# Patient Record
Sex: Female | Born: 1955 | Race: White | Hispanic: No | Marital: Single | Smoking: Former smoker
Health system: Southern US, Community
[De-identification: ages and names within clinical notes are randomized; demographics above are authoritative.]

## PROBLEM LIST (undated history)

## (undated) DIAGNOSIS — Z923 Personal history of irradiation: Secondary | ICD-10-CM

## (undated) DIAGNOSIS — I10 Essential (primary) hypertension: Secondary | ICD-10-CM

## (undated) DIAGNOSIS — C801 Malignant (primary) neoplasm, unspecified: Secondary | ICD-10-CM

## (undated) DIAGNOSIS — Z5111 Encounter for antineoplastic chemotherapy: Secondary | ICD-10-CM

## (undated) DIAGNOSIS — E785 Hyperlipidemia, unspecified: Secondary | ICD-10-CM

## (undated) DIAGNOSIS — C3492 Malignant neoplasm of unspecified part of left bronchus or lung: Secondary | ICD-10-CM

## (undated) DIAGNOSIS — I639 Cerebral infarction, unspecified: Secondary | ICD-10-CM

## (undated) DIAGNOSIS — J449 Chronic obstructive pulmonary disease, unspecified: Secondary | ICD-10-CM

## (undated) HISTORY — DX: Encounter for antineoplastic chemotherapy: Z51.11

## (undated) HISTORY — DX: Malignant neoplasm of unspecified part of left bronchus or lung: C34.92

## (undated) HISTORY — DX: Cerebral infarction, unspecified: I63.9

## (undated) HISTORY — PX: BREAST BIOPSY: SHX20

## (undated) HISTORY — DX: Personal history of irradiation: Z92.3

## (undated) HISTORY — DX: Essential (primary) hypertension: I10

## (undated) HISTORY — DX: Malignant (primary) neoplasm, unspecified: C80.1

## (undated) HISTORY — DX: Chronic obstructive pulmonary disease, unspecified: J44.9

## (undated) HISTORY — DX: Hyperlipidemia, unspecified: E78.5

---
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
## 2006-06-21 HISTORY — PX: THORACOTOMY/LOBECTOMY: SHX6116

## 2016-11-04 ENCOUNTER — Telehealth: Payer: Self-pay | Admitting: Family Medicine

## 2016-11-04 ENCOUNTER — Encounter: Payer: Self-pay | Admitting: Family Medicine

## 2016-11-04 ENCOUNTER — Ambulatory Visit (INDEPENDENT_AMBULATORY_CARE_PROVIDER_SITE_OTHER): Payer: Medicare (Managed Care) | Admitting: Family Medicine

## 2016-11-04 VITALS — BP 130/80 | HR 50 | Resp 12 | Ht 63.0 in | Wt 149.4 lb

## 2016-11-04 DIAGNOSIS — C3490 Malignant neoplasm of unspecified part of unspecified bronchus or lung: Secondary | ICD-10-CM | POA: Insufficient documentation

## 2016-11-04 DIAGNOSIS — M25561 Pain in right knee: Secondary | ICD-10-CM

## 2016-11-04 DIAGNOSIS — R001 Bradycardia, unspecified: Secondary | ICD-10-CM

## 2016-11-04 DIAGNOSIS — R55 Syncope and collapse: Secondary | ICD-10-CM

## 2016-11-04 DIAGNOSIS — E039 Hypothyroidism, unspecified: Secondary | ICD-10-CM

## 2016-11-04 DIAGNOSIS — C3492 Malignant neoplasm of unspecified part of left bronchus or lung: Secondary | ICD-10-CM | POA: Diagnosis not present

## 2016-11-04 LAB — BASIC METABOLIC PANEL
BUN: 24 mg/dL — ABNORMAL HIGH (ref 6–23)
CO2: 30 meq/L (ref 19–32)
Calcium: 9.5 mg/dL (ref 8.4–10.5)
Chloride: 102 mEq/L (ref 96–112)
Creatinine, Ser: 0.78 mg/dL (ref 0.40–1.20)
GFR: 80.21 mL/min (ref >60.00)
GLUCOSE: 84 mg/dL (ref 70–99)
POTASSIUM: 4.9 meq/L (ref 3.5–5.1)
SODIUM: 140 meq/L (ref 135–145)

## 2016-11-04 LAB — TSH: TSH: 2.36 u[IU]/mL (ref 0.35–4.50)

## 2016-11-04 NOTE — Patient Instructions (Addendum)
A few things to remember from today's visit:   Sinus bradycardia - Plan: EKG [id number], Basic metabolic panel, TSH  Malignant neoplasm of left lung, unspecified part of lung ([place]) - Plan: Ambulatory referral to Hematology / Oncology  Primary hypothyroidism  Syncope, unspecified syncope type  Acute pain of right knee  Local icy ho and Lidocaine for knee. ? Of osteoarthritis.  Avoid going up and down stairs if possible.   Please be sure medication list is accurate. If a new problem present, please set up appointment sooner than planned today.

## 2016-11-04 NOTE — Telephone Encounter (Signed)
Medications updated

## 2016-11-04 NOTE — Telephone Encounter (Signed)
°  FYI  Pt call to say she is taking the following med  metoprolo '25mg'$  1/2 in the morning and 1/2 at night  lipitor '40mg'$    prilosec '20mg'$   Advair 1 squirt in the morning and 1 squirt at night

## 2016-11-04 NOTE — Progress Notes (Signed)
HPI:   [name] is a 61 y.o. female, who is here today with her brother to establish care.  Former PCP: Dr Carlena Bjornstad, [facility]) Last preventive routine visit: within the past year.  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   Also hypothyroidism Dx about 20 years ago.She is on Levothyroxine 75 mcg daily.  She moved with her brother about 10 days ago because Dx with left lung cancer about 30 days ago, stage II. According to pt, chemo and radiotherapy was planned but recommended to be closed to family during treatment. She was following with Dr Truman Hayward, oncologists.  She is planning on moving to her own place in the next few weeks, not planning on going back to [place].   Concerns today:   Right knee pain:  "Badly" She is reporting "black out" episode 10/19/16, hospitalized for 2 days. She remembers being outdoors working on [employer] when she had episode of syncope. She landed on right side, suffered facial and right knee injury. According to pt, she had brain and knee imaging, no fractures or other acute abnormalities reported. In general knee pain is improving, no prior Hx of knee pain.    She denies prior Hx of syncope, no seizure activity reported.  She denies headache, visual changes, chest pain, dyspnea, claudication, focal weakness, or edema.   Today noted bradycardia, she is not sure of she had it before. Denies dizziness or palpitations. She has Hx of HTN on non pharmacologic treatment, she is taking a medication started after hospital discharge but doe snot recall name. 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    Review of Systems  Constitutional: Negative for activity change, appetite change, fatigue and fever.  HENT: Negative for mouth sores, nosebleeds and trouble swallowing.    Eyes: Negative for redness and visual disturbance.  Respiratory: Positive for cough (stable). Negative for shortness of breath and wheezing.   Cardiovascular: Negative for chest pain, palpitations and leg swelling.  Gastrointestinal: Negative for abdominal pain, nausea and vomiting.       Negative for changes in bowel habits.  Endocrine: Negative for cold intolerance and heat intolerance.  Genitourinary: Negative for decreased urine volume and hematuria.  Musculoskeletal: Positive for arthralgias and gait problem.  Skin: Negative for rash and wound.  Neurological: Negative for seizures, syncope, light-headedness and headaches.  Psychiatric/Behavioral: Negative for confusion and hallucinations. The patient is not nervous/anxious.     No current outpatient prescriptions on file prior to visit.   No current facility-administered medications on file prior to visit.     Past Medical History:  Diagnosis Date  . Cancer [facility])    Lun cancer: Right Dx 2008, s/p lobectomy. Left Dx 09/2016  . COPD (chronic obstructive pulmonary disease) ([place])   . Hyperlipidemia   . Hypertension   . Stroke [facility])    No Known Allergies Past Surgical History:  Procedure Laterality Date  . BREAST BIOPSY     several. Denies Hx of breast cancer.  . THORACOTOMY/LOBECTOMY Right 2008  . TONSILLECTOMY  1960    Family History  Problem Relation Age of Onset  . Cancer Mother        lung  . Arthritis Mother   . Hypertension Father   . Arthritis Father   . Cancer Sister  pancreas  . Cancer Brother        brain  . Cancer Brother        colon  . Hypertension Brother   . Mental retardation Brother   . Depression Neg Hx   . Heart disease Neg Hx     Social History   Social History  . Marital status: Single    Spouse name: N/A  . Number of children: N/A  . Years of education: N/A   Social History Main Topics  . Smoking status: Former Smoker    Types: Cigarettes    Start date: 06/21/2006  .  Smokeless tobacco: Never Used  . Alcohol use No  . Drug use: No  . Sexual activity: Not Currently   Other Topics Concern  . None   Social History Narrative  . None    Vitals:   11/04/16 1127  BP: 130/80  Pulse: (!) 50  Resp: 12   O2 sat at RA 96% Body mass index is 26.46 kg/m.   Physical Exam  Nursing note and vitals reviewed. Constitutional: She is oriented to person, place, and time. She appears well-developed and well-nourished. No distress.  HENT:  Head: Atraumatic.  Mouth/Throat: Oropharynx is clear and moist and mucous membranes are normal.  Eyes: Conjunctivae and EOM are normal. Pupils are equal, round, and reactive to light.  Neck: No tracheal deviation present. Thyromegaly present.  Cardiovascular: Regular rhythm.  Bradycardia present.   No murmur heard. Pulses:      Dorsalis pedis pulses are 2+ on the right side, and 2+ on the left side.  Isolated heart sounds.  Respiratory: Effort normal and breath sounds normal. No respiratory distress.  GI: Soft. She exhibits no mass. There is no hepatomegaly. There is no tenderness.  Musculoskeletal: She exhibits no edema.       Right knee: She exhibits swelling. She exhibits normal range of motion, no deformity, no erythema and no bony tenderness. Tenderness found. Lateral joint line tenderness noted.  Contractions RUE: elbow and IP joints with muscle atrophy and limitation of ROM (shoudler,elbow, wrist,IP) Right knee with tenderness elicited upon ROM and palpation of soft tissue around joint.   Lymphadenopathy:    She has no cervical adenopathy.  Neurological: She is alert and oriented to person, place, and time. She displays atrophy (RUE).  Stable gait assisted with a cane.   Skin: Skin is warm. Ecchymosis (Under right knee, 2 cm, no hematoma appreciated, mildly tender.) noted. No rash noted. No erythema.  Also ecchymosis lateral periocular right side.   Psychiatric: She has a normal mood and affect.  Well groomed,  good eye contact.    ASSESSMENT AND PLAN:   Nur was seen today for establish care.  Diagnoses and all orders for this visit:  Lab Results  Component Value Date   CREATININE 0.78 11/04/2016   BUN 24 (H) 11/04/2016   NA 140 11/04/2016   K 4.9 11/04/2016   CL 102 11/04/2016   CO2 30 11/04/2016   Lab Results  Component Value Date   TSH 2.36 11/04/2016    Acute pain of right knee  Improving and reporting imaging done after injury,negative for acute process. I do not think imaging needs to be repeated. Local ice, elevation, and ROM exercises for now. Topical Icy hot with Lidocaine may help. Avoid activities that can aggravate pain and fall prevention discussed.   Sinus bradycardia  Asymptomatic. ? Chronic. EKG today: SR, normal axis and intervals, no signs of acute ischemia.No other  EKG for comparison. She will let us know about new med she is taking, ? BB. Instructed about warning signs. Instructed to monitor HR. F/U in 6 weeks.  -     EKG 12-Lead -     Basic metabolic panel -     TSH  Malignant neoplasm of left lung, unspecified part of lung ([place])  I do not have records from former PCP or oncologists. Referral to oncologists placed.  -     Ambulatory referral to Hematology / Oncology  Primary hypothyroidism  No changes in current management, will follow labs done today and will give further recommendations accordingly. F/U in 6-12 months.  -     TSH  Syncope, unspecified syncope type  We discussed possible causes including arrhythmias. She has not had more episodes. Adequate hydration when working outdoors. Instructed about warning signs.  I do not have hospital records at this time.        Felicia Herrera G. [name], MD  [facility]. [place] office.

## 2016-12-06 ENCOUNTER — Telehealth: Payer: Self-pay | Admitting: Family Medicine

## 2016-12-06 DIAGNOSIS — C3492 Malignant neoplasm of unspecified part of left bronchus or lung: Secondary | ICD-10-CM

## 2016-12-06 NOTE — Telephone Encounter (Signed)
We have received her records. - they should be on the desk in your office.   Okay for pulmonologist referral? The hematology one has already been done.

## 2016-12-06 NOTE — Telephone Encounter (Signed)
It is ok to refer to pulmonologist. I did place referral to oncology after last OV.  Thanks, BJ

## 2016-12-06 NOTE — Telephone Encounter (Signed)
Pt calling to see if Dr. [name] has received her records and has made the two referrals for plumologist and hematologist.

## 2016-12-07 NOTE — Telephone Encounter (Signed)
Left voicemail letting patient know that we have received her medical records & that both referrals have been entered. Advised to call back with any questions.  Referrals placed & notes given to Juliann Pulse to send to Oncology for the referral.

## 2016-12-09 ENCOUNTER — Encounter: Payer: Self-pay | Admitting: Internal Medicine

## 2016-12-09 ENCOUNTER — Ambulatory Visit (INDEPENDENT_AMBULATORY_CARE_PROVIDER_SITE_OTHER): Payer: Medicare Other | Admitting: Internal Medicine

## 2016-12-09 VITALS — BP 152/102 | HR 70 | Ht 62.5 in | Wt 148.2 lb

## 2016-12-09 DIAGNOSIS — Z85118 Personal history of other malignant neoplasm of bronchus and lung: Secondary | ICD-10-CM | POA: Insufficient documentation

## 2016-12-09 DIAGNOSIS — Z8709 Personal history of other diseases of the respiratory system: Secondary | ICD-10-CM

## 2016-12-09 NOTE — Assessment & Plan Note (Signed)
Get CT scan chest with contrast (might need bmet check first) Refer to Dr Julien Nordmann due to history of active recurrence in left lung April 2018

## 2016-12-09 NOTE — Progress Notes (Signed)
Subjective:    Patient ID: Felicia Herrera, female    DOB: April 24, 1956, 61 y.o.   MRN: [id number]  PCP [name], [name], MD   HPI  IOV 12/09/2016  Chief Complaint  Patient presents with  . Pulmonary Consult    Pt referred by Dr. Betty [name] for malignant neoplasm of left lung. Pt denies SOB and CP/tightness.  Pt c/o dry cough.    Felicia Herrera 1956-04-03 [address] [place] [address] - 61 year old female referred by Dr. [name] for evaluation of COPD and lung cancer  At age 41 she moved from [place] where she was born to [facility] where she lived all her life. She used to smoke heavily in the past quit 11 years ago. 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 This happened in the aftermath of right lobectomy/pneumonectomy according to her history for lung cancer. After the lung cancer was in complete remission and she was leading a functional life. Then in 2018 under surveillance she's had a recurrence of lung cancer in the left lung in the area behind her left breast. Apparently this is been biopsy confirmed at [facility] in [facility]. She recollects having had CT scan of the chest and the PET scan back in the spring 2018. Apparently she i' need of chemotherapy and radiation therapy but because she was living alone in [place] she was asked to relocate to [facility] by her brother lives. She currently lives by herself and is awaiting a roommate. She uses a cane to get around. But she is otherwise functional and does not have any dysphagia or cough or dyspnea.  She denies a diagnosis of COPD although he does mention in the chart and she does admit that her Advair helps with seems somewhat contradictory. She has smoked heavily in the past   Results for [name], [name] [facility] (MRN [id number]) as of 12/09/2016 11:51  Ref. Range 11/04/2016 12:55  Creatinine Latest  Ref Range: 0.40 - 1.20 mg/dL 0.78      has a past medical history of Cancer ([place]); COPD (chronic obstructive pulmonary disease) ([place]); Hyperlipidemia; Hypertension; and Stroke [facility]).   reports that she quit smoking about 11 years ago. Her smoking use included Cigarettes. She has a 30.00 pack-year smoking history. She has never used smokeless tobacco.  Past Surgical History:  Procedure Laterality Date  . BREAST BIOPSY     several. Denies Hx of breast cancer.  . THORACOTOMY/LOBECTOMY Right 2008  . TONSILLECTOMY  1960    No Known Allergies  Immunization History  Administered Date(s) Administered  . Influenza,inj,Quad PF,36+ Mos 03/21/2016    Family History  Problem Relation Age of Onset  . Cancer Mother        lung  . Arthritis Mother   . Hypertension Father   . Arthritis Father   . Cancer Sister        pancreas  . Cancer Brother        brain  . Cancer Brother        colon  . Hypertension Brother   . Mental retardation Brother   . Depression Neg Hx   . Heart disease Neg Hx      Current Outpatient Prescriptions:  .  aspirin EC 81 MG tablet, Take 81 mg by mouth daily., Disp: , Rfl:  .  atorvastatin (LIPITOR) 40 MG tablet, Take 40 mg by mouth daily.,  Disp: , Rfl:  .  levothyroxine (SYNTHROID, LEVOTHROID) 75 MCG tablet, Take 75 mcg by mouth daily before breakfast., Disp: , Rfl:  .  omeprazole (PRILOSEC) 20 MG capsule, Take 20 mg by mouth daily., Disp: , Rfl:     Review of Systems  Constitutional: Negative for fever and unexpected weight change.  HENT: Negative for congestion, dental problem, ear pain, nosebleeds, postnasal drip, rhinorrhea, sinus pressure, sneezing, sore throat and trouble swallowing.   Eyes: Negative for redness and itching.  Respiratory: Positive for cough. Negative for chest tightness, shortness of breath and wheezing.   Cardiovascular: Negative for palpitations and leg swelling.  Gastrointestinal: Negative for nausea and vomiting.    Genitourinary: Negative for dysuria.  Musculoskeletal: Negative for joint swelling.  Skin: Negative for rash.  Neurological: Negative for headaches.  Hematological: Does not bruise/bleed easily.  Psychiatric/Behavioral: Negative for dysphoric mood. The patient is not nervous/anxious.        Objective:   Physical Exam  Vitals:   12/09/16 1117  BP: (!) 152/102  Pulse: 70  SpO2: 97%  Weight: 148 lb 3.2 oz (67.2 kg)  Height: 5' 2.5" (1.588 m)         Assessment & Plan:     ICD-10-CM   1. History of COPD Z87.09   2. History of lung cancer Z85.118     History of COPD Do copd cat score Continue advair Sign release to get PFT from [facility] or Dr Betty [name]  Followup 2-3 months or sooner if needed  History of lung cancer Get CT scan chest with contrast (might need bmet check first) Refer to Dr Julien Nordmann due to history of active recurrence in left lung April 2018    Dr. Brand Males, M.D., F.C.C.P Pulmonary and Critical Care Medicine Staff Physician [facility] Pager: [phone], If no answer or between  15:00h - 7:00h: call [phone]  12/09/2016 12:08 PM

## 2016-12-09 NOTE — Assessment & Plan Note (Signed)
Do copd cat score Continue advair Sign release to get PFT from [facility] or Dr Betty [name]  Followup 2-3 months or sooner if needed

## 2016-12-09 NOTE — Patient Instructions (Signed)
History of COPD Do copd cat score Continue advair Sign release to get PFT from [facility] or Dr Betty [name]  Followup 2-3 months or sooner if needed  History of lung cancer Get CT scan chest with contrast (might need bmet check first) Refer to Dr Julien Nordmann due to history of active recurrence in left lung April 2018  Followu; 2- 3 months

## 2016-12-15 ENCOUNTER — Telehealth: Payer: Self-pay | Admitting: Internal Medicine

## 2016-12-15 ENCOUNTER — Telehealth: Payer: Self-pay | Admitting: *Deleted

## 2016-12-15 ENCOUNTER — Encounter: Payer: Self-pay | Admitting: Internal Medicine

## 2016-12-15 NOTE — Telephone Encounter (Signed)
FYI "This is Ashely with [facility] returning call about this patient we referred to [facility]."  Grateful new patient coordinator accepted call in spite of not calling [place].

## 2016-12-15 NOTE — Telephone Encounter (Signed)
Spoke with andrea at oncology, wanting clarification regarding referral to their office.  I offered assistance as  I could.  Nothing further needed at this time.

## 2016-12-15 NOTE — Telephone Encounter (Signed)
Received a call from the pt to schedule an appt. Pt was really upset but was able to calm down once she received an appt. I let the pt know that I will notify our [place] about her referral as well and to expect a call from her. Pt voiced understanding. Appt has been scheduled for the pt to see Dr. Julien Nordmann on 7/9 at 215pm. Pt aware to arrive 30 minutes early. Letter mailed.

## 2016-12-15 NOTE — Telephone Encounter (Signed)
Oncology Nurse Navigator Documentation  Oncology Nurse Navigator Flowsheets 12/15/2016  Navigator Location [facility]  Navigator Encounter Type Telephone/I received a message from new patient scheduler.  Patient seemed upset on the phone.  I called to reach out to her. I was unable to reach her and left my name and phone number.    Telephone Outgoing Call  Treatment Phase Abnormal Scans  Barriers/Navigation Needs Education  Acuity Level 1  Time Spent with Patient 15

## 2016-12-16 NOTE — Progress Notes (Signed)
HPI:   [name] is a 61 y.o. female, who is here today to follow on recent OV.   She was seen on 11/04/16, when she was establishing care. She was bradycardic, asymptomatic. Apparently she was on Metoprolol for HTN management, which was discontinued.  She was diagnosed with hypertension about 10-15 years ago. According to patient, she was taking other antihypertensive medications when she was first diagnosed, she was able to manage probably with dietary changes and regular exercise.  Home BP's: Not checking. Denies headache, visual changes, chest pain, dyspnea, palpitation, claudication, new focal weakness, or edema.  History of left lung malignancy,S/P right pneumonectomy due to lung cancer.   COPD, currently she is on Advair 250/50 mcg twice daily. She denies worsening cough, she has not noted wheezing or dyspnea.  She followed with pulmonologist on 12/09/16, BP was elevated at 152/102. She states that she usually has elevated BP during OV's.  She is waiting for oncology appt to start treatment for lung cancer. She has an appt with oncologists on  12/27/16. Pending chest CT scan 12/21/16.  Feeling "pretty good"  She presently moved to her own place.   She is not driving. She has not ben able to get her driver license here in [place]. She is waiting for SS, which she packed in boxes she mailed before leaving [place].  Medication list has been updated. History of GERD, currently she is on omeprazole 20 mg daily.  Denies abdominal pain, nausea, vomiting, changes in bowel habits, blood in stool or melena.  Hyperlipidemia, she is on Atorvastatin 40 mg daily.    Review of Systems  Constitutional: Negative for activity change, appetite change, fatigue, fever and unexpected weight change.  HENT: Negative for mouth sores, nosebleeds and trouble swallowing.   Eyes: Negative for redness and visual disturbance.  Respiratory: Positive for cough. Negative for shortness  of breath and wheezing.   Cardiovascular: Negative for chest pain, palpitations and leg swelling.  Gastrointestinal: Negative for abdominal pain, nausea and vomiting.       Negative for changes in bowel habits.  Genitourinary: Negative for decreased urine volume, difficulty urinating, dysuria and hematuria.  Neurological: Negative for syncope, weakness and headaches.  Psychiatric/Behavioral: Negative for confusion. The patient is not nervous/anxious.       Current Outpatient Prescriptions on File Prior to Visit  Medication Sig Dispense Refill  . aspirin EC 81 MG tablet Take 81 mg by mouth daily.    Marland Kitchen atorvastatin (LIPITOR) 40 MG tablet Take 40 mg by mouth daily.    Marland Kitchen levothyroxine (SYNTHROID, LEVOTHROID) 75 MCG tablet Take 75 mcg by mouth daily before breakfast.    . omeprazole (PRILOSEC) 20 MG capsule Take 20 mg by mouth daily.     No current facility-administered medications on file prior to visit.      Past Medical History:  Diagnosis Date  . Cancer [facility])    Lun cancer: Right Dx 2008, s/p lobectomy. Left Dx 09/2016  . COPD (chronic obstructive pulmonary disease) ([place])   . Hyperlipidemia   . Hypertension   . Stroke [facility])    No Known Allergies  Social History   Social History  . Marital status: Single    Spouse name: N/A  . Number of children: N/A  . Years of education: N/A   Social History Main Topics  . Smoking status: Former Smoker    Packs/day: 1.00    Years: 30.00    Types: Cigarettes    Quit  date: 06/21/2005  . Smokeless tobacco: Never Used  . Alcohol use No  . Drug use: No  . Sexual activity: Not Currently   Other Topics Concern  . None   Social History Narrative   Landscaper.    Lives alone.     Vitals:   12/17/16 0951  BP: 120/90  Pulse: 91  Resp: 12  O2 sat at RA 96% Body mass index is 26.68 kg/m.   Physical Exam  Nursing note and vitals reviewed. Constitutional: She is oriented to person, place, and time. She appears well-developed and  well-nourished. No distress.  HENT:  Head: Atraumatic.  Mouth/Throat: Oropharynx is clear and moist and mucous membranes are normal.  Eyes: Conjunctivae and EOM are normal. Pupils are equal, round, and reactive to light.  Cardiovascular: Normal rate and regular rhythm.   No murmur heard. Pulses:      Dorsalis pedis pulses are 2+ on the right side, and 2+ on the left side.  Respiratory: Effort normal and breath sounds normal. No respiratory distress.  GI: Soft. She exhibits no mass. There is no hepatomegaly. There is no tenderness.  Musculoskeletal: She exhibits no edema or tenderness.  Contractions changes of RUE joints, s/p CVA:   Neurological: She is alert and oriented to person, place, and time. She displays atrophy (RUE).  Stable gait w/o assistance.   Skin: Skin is warm. No rash noted. No erythema.  Psychiatric: She has a normal mood and affect.  Well groomed, good eye contact.      ASSESSMENT AND PLAN:   Felicia Herrera was seen today for follow-up.  Diagnoses and all orders for this visit:  Malignant neoplasm of left lung, unspecified part of lung [facility])  She has appt with oncologists. She is having some cough (stable) but not other symptom. Chest CT has been arranged.  She is also following with pulmonologist for COPD.  Gastroesophageal reflux disease, esophagitis presence not specified  GERD precautions discussed. Stable. No changes in current management. F/U in 12 months.  Hypertension, essential, benign  Adequately controlled on non pharmacologic treatment. Monitor BP at home.  DASH-low salt diet to continue. Eye exam recommended annually. F/U in 12 months, she will be following with pulmonologist and oncologists. She will follow earlier if needed.      [name]. [name], MD  [facility]. [place] office.

## 2016-12-17 ENCOUNTER — Ambulatory Visit (INDEPENDENT_AMBULATORY_CARE_PROVIDER_SITE_OTHER): Payer: Medicare Other | Admitting: Family Medicine

## 2016-12-17 ENCOUNTER — Encounter: Payer: Self-pay | Admitting: Family Medicine

## 2016-12-17 VITALS — BP 120/90 | HR 91 | Resp 12 | Ht 62.5 in | Wt 148.2 lb

## 2016-12-17 DIAGNOSIS — C3492 Malignant neoplasm of unspecified part of left bronchus or lung: Secondary | ICD-10-CM | POA: Diagnosis not present

## 2016-12-17 DIAGNOSIS — I10 Essential (primary) hypertension: Secondary | ICD-10-CM | POA: Diagnosis not present

## 2016-12-17 DIAGNOSIS — K219 Gastro-esophageal reflux disease without esophagitis: Secondary | ICD-10-CM

## 2016-12-17 NOTE — Patient Instructions (Addendum)
A few things to remember from today's visit:  [name], today we have followed on some of your chronic medical problems and they seem to be stable, so no changes in current management today.  Review medication list and be sure it is accurate.  -Remember a healthy diet and regular physical activity are very important for prevention as well as for well being; they also help with many chronic problems, decreasing the need of adding new medications and delaying or preventing possible complications.  I will see you back in 12 months, before if needed. Monitor blood pressure at home.  Caution with outdoors work. Fall precautions.    Remember to arrange your follow up appt before leaving today.  Please follow sooner than planned if a new concern arises.   Malignant neoplasm of left lung, unspecified part of lung (HCC)  Gastroesophageal reflux disease, esophagitis presence not specified  Hypertension, essential, benign  Blood pressure goal for most people is less than 140/90. Some populations (older than 60) the goal is less than 150/90.  Most recent cardiologists' recommendations recommend blood pressure at or less than 130/80.   Elevated blood pressure increases the risk of strokes, heart and kidney disease, and eye problems. Regular physical activity and a healthy diet (DASH diet) usually help. Low salt diet. Take medications as instructed.  Caution with some over the counter medications as cold medications, dietary products (for weight loss), and Ibuprofen or Aleve (frequent use);all these medications could cause elevation of blood pressure.   Please be sure medication list is accurate. If a new problem present, please set up appointment sooner than planned today.

## 2016-12-21 ENCOUNTER — Telehealth: Payer: Self-pay | Admitting: Internal Medicine

## 2016-12-21 ENCOUNTER — Ambulatory Visit (INDEPENDENT_AMBULATORY_CARE_PROVIDER_SITE_OTHER)
Admission: RE | Admit: 2016-12-21 | Discharge: 2016-12-21 | Disposition: A | Discharge status: Home / Self Care | Payer: Medicare Other | Source: Ambulatory Visit | Attending: Internal Medicine | Admitting: Internal Medicine

## 2016-12-21 ENCOUNTER — Ambulatory Visit
Admission: RE | Admit: 2016-12-21 | Discharge: 2016-12-21 | Disposition: A | Discharge status: Home / Self Care | Payer: Self-pay | Source: Ambulatory Visit | Attending: Internal Medicine | Admitting: Internal Medicine

## 2016-12-21 DIAGNOSIS — Z8709 Personal history of other diseases of the respiratory system: Secondary | ICD-10-CM

## 2016-12-21 DIAGNOSIS — C3490 Malignant neoplasm of unspecified part of unspecified bronchus or lung: Secondary | ICD-10-CM | POA: Diagnosis not present

## 2016-12-21 DIAGNOSIS — Z85118 Personal history of other malignant neoplasm of bronchus and lung: Secondary | ICD-10-CM

## 2016-12-21 MED ORDER — IOPAMIDOL (ISOVUE-300) INJECTION 61%
100.0000 mL | Freq: Once | INTRAVENOUS | Status: AC | PRN
  Administered 2016-12-21: 80 mL via INTRAVENOUS

## 2016-12-21 NOTE — Telephone Encounter (Signed)
Received call from [facility] w/ radiology MR wanted previous scans on discs sent to [facility] prior to CT Chest scheduled for today so that the radiologist would have the images for baseline but the discs went to the patient rather than [facility].  Per Erline Levine she is needing orders placed for the outside films to be scanned/attached to - [id number]  Orders placed Nothing further needed; will sign off

## 2016-12-27 ENCOUNTER — Other Ambulatory Visit (HOSPITAL_BASED_OUTPATIENT_CLINIC_OR_DEPARTMENT_OTHER): Payer: Medicare Other

## 2016-12-27 ENCOUNTER — Ambulatory Visit (HOSPITAL_BASED_OUTPATIENT_CLINIC_OR_DEPARTMENT_OTHER): Payer: Medicare Other | Admitting: Internal Medicine

## 2016-12-27 ENCOUNTER — Other Ambulatory Visit: Payer: Self-pay | Admitting: Medical Oncology

## 2016-12-27 ENCOUNTER — Ambulatory Visit: Payer: Medicare Other

## 2016-12-27 ENCOUNTER — Telehealth: Payer: Self-pay | Admitting: Internal Medicine

## 2016-12-27 ENCOUNTER — Encounter: Payer: Self-pay | Admitting: Internal Medicine

## 2016-12-27 VITALS — BP 118/74 | HR 79 | Temp 99.3°F | Resp 20 | Ht 62.5 in | Wt 149.6 lb

## 2016-12-27 DIAGNOSIS — J449 Chronic obstructive pulmonary disease, unspecified: Secondary | ICD-10-CM | POA: Diagnosis not present

## 2016-12-27 DIAGNOSIS — I10 Essential (primary) hypertension: Secondary | ICD-10-CM

## 2016-12-27 DIAGNOSIS — C3492 Malignant neoplasm of unspecified part of left bronchus or lung: Secondary | ICD-10-CM | POA: Diagnosis not present

## 2016-12-27 DIAGNOSIS — Z85118 Personal history of other malignant neoplasm of bronchus and lung: Secondary | ICD-10-CM

## 2016-12-27 DIAGNOSIS — Z7189 Other specified counseling: Secondary | ICD-10-CM

## 2016-12-27 DIAGNOSIS — Z5111 Encounter for antineoplastic chemotherapy: Secondary | ICD-10-CM | POA: Insufficient documentation

## 2016-12-27 DIAGNOSIS — Z8709 Personal history of other diseases of the respiratory system: Secondary | ICD-10-CM

## 2016-12-27 HISTORY — DX: Encounter for antineoplastic chemotherapy: Z51.11

## 2016-12-27 HISTORY — DX: Malignant neoplasm of unspecified part of left bronchus or lung: C34.92

## 2016-12-27 LAB — CBC WITH DIFFERENTIAL/PLATELET
BASO%: 0.5 % (ref 0.0–2.0)
Basophils Absolute: 0 10*3/uL (ref 0.0–0.1)
EOS ABS: 0.1 10*3/uL (ref 0.0–0.5)
EOS%: 0.8 % (ref 0.0–7.0)
HEMATOCRIT: 36.8 % (ref 34.8–46.6)
HGB: 11.8 g/dL (ref 11.6–15.9)
LYMPH#: 1.5 10*3/uL (ref 0.9–3.3)
LYMPH%: 17.7 % (ref 14.0–49.7)
MCH: 27.8 pg (ref 25.1–34.0)
MCHC: 32.1 g/dL (ref 31.5–36.0)
MCV: 86.6 fL (ref 79.5–101.0)
MONO#: 0.6 10*3/uL (ref 0.1–0.9)
MONO%: 6.7 % (ref 0.0–14.0)
NEUT%: 74.3 % (ref 38.4–76.8)
NEUTROS ABS: 6.1 10*3/uL (ref 1.5–6.5)
PLATELETS: 411 10*3/uL — AB (ref 145–400)
RBC: 4.25 10*6/uL (ref 3.70–5.45)
RDW: 13.2 % (ref 11.2–14.5)
WBC: 8.3 10*3/uL (ref 3.9–10.3)

## 2016-12-27 LAB — COMPREHENSIVE METABOLIC PANEL
ALK PHOS: 99 U/L (ref 40–150)
ALT: 14 U/L (ref 0–55)
ANION GAP: 10 meq/L (ref 3–11)
AST: 16 U/L (ref 5–34)
Albumin: 3.7 g/dL (ref 3.5–5.0)
BILIRUBIN TOTAL: 0.44 mg/dL (ref 0.20–1.20)
BUN: 16.1 mg/dL (ref 7.0–26.0)
CALCIUM: 9.5 mg/dL (ref 8.4–10.4)
CO2: 26 mEq/L (ref 22–29)
CREATININE: 0.8 mg/dL (ref 0.6–1.1)
Chloride: 107 mEq/L (ref 98–109)
EGFR: 83 mL/min/{1.73_m2} — AB (ref >90)
Glucose: 97 mg/dl (ref 70–140)
Potassium: 3.7 mEq/L (ref 3.5–5.1)
Sodium: 143 mEq/L (ref 136–145)
TOTAL PROTEIN: 7.4 g/dL (ref 6.4–8.3)

## 2016-12-27 NOTE — Progress Notes (Signed)
[place] [phone]   [phone]  CONSULT NOTE  REFERRING PHYSICIAN:Dr. Belva Crome Ramaswamy  REASON FOR CONSULTATION:  61 years old white female with recurrent lung cancer in the left lung.  HPI Felicia Herrera is a 61 y.o. female with past medical history significant for lung cancer likely non-small cell carcinoma diagnosed in 2007 status post right pneumonectomy in 2008 in [place]. According to the available history from the patient she received 4 cycles of adjuvant systemic chemotherapy at that time I'm a medical oncologist at [facility]. She was followed by observation for around 5 years with no evidence for disease recurrence. In February 2018 the patient had a chest x-ray that showed masslike opacity measuring 3.0 cm in the left lung. This was followed by CT scan of the chest on 08/30/2016 and it showed a spiculated left upper lobe mass measuring 2.2 x 3.1 x 4.9 cm in addition to suspicious left hilar adenopathy. The patient underwent CT-guided core biopsy by interventional radiology and the final pathology ([id number]) was consistent with primary lung adenocarcinoma, well-differentiated, acinar type. The immunohistochemical stains showed past with TTF-1 and negative CK 5/6. There was insufficient material for molecular studies. The patient had a PET scan on 09/24/2016 and it showed hypermetabolic left upper lobe lung mass in addition to left hilar lymphadenopathy. She also mentioned that she had MRI of the brain in April 2018 that was negative for malignancy. The patient moved to [facility] one month ago to be close to her brother and sister-in-law. She was seen by Dr. Chase Caller and he kindly referred her to me today for evaluation and recommendation regarding treatment of her newly diagnosed left lung primary adenocarcinoma. When seen today the patient is feeling fine except for intermittent pain on the right side of the  chest. She has mild cough ranging from dry to productive 4 field which sputum. She has no shortness breath or hemoptysis. She denied having any recent weight loss or night sweats. She has no headache or visual changes. She has no nausea, vomiting, diarrhea or constipation. Family history significant for a mother who had lung cancer at age 62, father had hypertension. Sister had pancreatic cancer at age 20 and 2 half-brothers with brain cancer and colon cancer at age 48. The patient is single and has no children. She lives alone in [place] but she has brother and sister-in-law close by. She has a history of smoking the low 0.5 pack per day for around 30 years and quit in 2007. She has no history of alcohol or drug abuse. HPI  Past Medical History:  Diagnosis Date  . Cancer [facility])    Lun cancer: Right Dx 2008, s/p lobectomy. Left Dx 09/2016  . COPD (chronic obstructive pulmonary disease) ([place])   . Hyperlipidemia   . Hypertension   . Stroke [facility])     Past Surgical History:  Procedure Laterality Date  . BREAST BIOPSY     several. Denies Hx of breast cancer.  . THORACOTOMY/LOBECTOMY Right 2008  . TONSILLECTOMY  1960    Family History  Problem Relation Age of Onset  . Cancer Mother        lung  . Arthritis Mother   . Hypertension Father   . Arthritis Father   . Cancer Sister        pancreas  . Cancer Brother        brain  . Cancer Brother        colon  .  Hypertension Brother   . Mental retardation Brother   . Depression Neg Hx   . Heart disease Neg Hx     Social History Social History  Substance Use Topics  . Smoking status: Former Smoker    Packs/day: 1.00    Years: 30.00    Types: Cigarettes    Quit date: 06/21/2005  . Smokeless tobacco: Never Used  . Alcohol use No    No Known Allergies  Current Outpatient Prescriptions  Medication Sig Dispense Refill  . aspirin EC 81 MG tablet Take 81 mg by mouth daily.    Marland Kitchen atorvastatin (LIPITOR) 40 MG tablet Take 40 mg by  mouth daily.    . Fluticasone-Salmeterol (ADVAIR) 250-50 MCG/DOSE AEPB Inhale 1 puff into the lungs 2 (two) times daily.    Marland Kitchen levothyroxine (SYNTHROID, LEVOTHROID) 75 MCG tablet Take 75 mcg by mouth daily before breakfast.    . omeprazole (PRILOSEC) 20 MG capsule Take 20 mg by mouth daily.     No current facility-administered medications for this visit.     Review of Systems  Constitutional: positive for fatigue Eyes: negative Ears, nose, mouth, throat, and face: negative Respiratory: positive for cough Cardiovascular: negative Gastrointestinal: negative Genitourinary:negative Integument/breast: negative Hematologic/lymphatic: negative Musculoskeletal:negative Neurological: positive for weakness Behavioral/Psych: negative Endocrine: negative Allergic/Immunologic: negative  Physical Exam  WNI:OEVOJ, healthy, no distress, well nourished and well developed SKIN: skin color, texture, turgor are normal, no rashes or significant lesions HEAD: Normocephalic, No masses, lesions, tenderness or abnormalities EYES: normal, PERRLA, Conjunctiva are pink and non-injected EARS: External ears normal, Canals clear OROPHARYNX:no exudate, no erythema and lips, buccal mucosa, and tongue normal  NECK: supple, no adenopathy, no JVD LYMPH:  no palpable lymphadenopathy, no hepatosplenomegaly BREAST:not examined LUNGS: clear to auscultation , and palpation HEART: regular rate & rhythm, no murmurs and no gallops ABDOMEN:abdomen soft, non-tender, normal bowel sounds and no masses or organomegaly BACK: Back symmetric, no curvature., No CVA tenderness EXTREMITIES:no joint deformities, effusion, or inflammation, no edema, no skin discoloration  NEURO: positive findings: muscular weakness right upper extremity secondary to previous stroke  PERFORMANCE STATUS: ECOG 1  LABORATORY DATA: Lab Results  Component Value Date   WBC 8.3 12/27/2016   HGB 11.8 12/27/2016   HCT 36.8 12/27/2016   MCV 86.6  12/27/2016   PLT 411 (H) 12/27/2016      Chemistry      Component Value Date/Time   NA 140 11/04/2016 1255   K 4.9 11/04/2016 1255   CL 102 11/04/2016 1255   CO2 30 11/04/2016 1255   BUN 24 (H) 11/04/2016 1255   CREATININE 0.78 11/04/2016 1255      Component Value Date/Time   CALCIUM 9.5 11/04/2016 1255       RADIOGRAPHIC STUDIES: Ct Chest W Contrast  Result Date: 12/21/2016 CLINICAL DATA:  Lung cancer. Status post surgery and radiation therapy EXAM: CT CHEST WITH CONTRAST TECHNIQUE: Multidetector CT imaging of the chest was performed during intravenous contrast administration. CONTRAST:  40mL ISOVUE-300 IOPAMIDOL (ISOVUE-300) INJECTION 61% COMPARISON:  Outside CT 08/30/2016 FINDINGS: Cardiovascular: No significant vascular findings. Normal heart size. No pericardial effusion. Mediastinum/Nodes: No axillary or supraclavicular adenopathy. No mediastinal hilar adenopathy. Esophagus normal. Lungs/Pleura: RIGHT pneumonectomy anatomy. The LEFT lung is hyperexpanded into the RIGHT hemithorax. Expandable stent within the trachea is patent and not changed. Within the hyperinflated LEFT upper lobe lobular mass measures 52 x 31 mm compared to 49 x 30 mm for no significant change. No additional pulmonary nodules suggest metastatic disease. There is  a segmental reticulonodular pattern in the LEFT lower lobe which is new from prior and suggests pulmonary infection. Upper Abdomen: Limited view of the liver, kidneys, pancreas are unremarkable. Normal adrenal glands. Musculoskeletal: No aggressive osseous lesion. IMPRESSION: 1. No significant interval change in large LEFT upper lobe pulmonary mass. 2. Postsurgical change consistent RIGHT pneumonectomy. Tracheal stent is not changed. 3. New reticulonodular pattern in the LEFT lower lobe suggests mild pulmonary infection Electronically Signed   By: Suzy Bouchard M.D.   On: 12/21/2016 16:53    ASSESSMENT: This is a very pleasant 61 years old white female  with history of non-small cell lung cancer in 2007 status post right pneumonectomy in 2008. The patient has a recent diagnosis of stage IIB (T2b, N1, M0) non-small cell lung cancer, adenocarcinoma presented with large left upper lobe lung mass in addition to left hilar lymphadenopathy diagnosed in April 2018. Her previous workup was done at [facility] in [facility]. There was insufficient material to perform the molecular studies.   PLAN: I had a lengthy discussion with the patient today about her current disease stage, prognosis and treatment options. I personally and independently reviewed the scan images and discuss the results with the patient and showed her the images today. I explained to the patient that she has a resectable stage IIb non-small cell lung cancer but unfortunately because of her previous right pneumonectomy she may not be a good surgical candidate for resection. I strongly recommended for the patient a course of concurrent chemoradiation with weekly carboplatin for AUC of 2 and paclitaxel 45 MG/M2. I discussed with the patient adverse effect of the chemotherapy including but not limited to alopecia, myelosuppression, nausea and vomiting, peripheral neuropathy, liver or renal dysfunction. I referred the patient to radiation oncology for evaluation and discussion of the radiotherapy option. For molecular studies, I will send a blood sample to [facility].  The patient is expected to start the first dose of chemotherapy on 01/10/2017. She will have a chemotherapy education class before the first dose of her treatment. I will send a prescription for Compazine 10 mg by mouth every 6 hours as needed for nausea to her pharmacy. She will come back for follow-up visit in 4 weeks for evaluation and management of any adverse effect of her treatment. The patient was advised to call immediately if she has any concerning symptoms in the interval. The patient voices  understanding of current disease status and treatment options and is in agreement with the current care plan. All questions were answered. The patient knows to call the clinic with any problems, questions or concerns. We can certainly see the patient much sooner if necessary.  Thank you so much for allowing me to participate in the care of Felicia Herrera. I will continue to follow up the patient with you and assist in her care.  I spent 55 minutes counseling the patient face to face. The total time spent in the appointment was 80 minutes.  Disclaimer: This note was dictated with voice recognition software. Similar sounding words can inadvertently be transcribed and may not be corrected upon review.   Merline Perkin K. December 27, 2016, 2:16 PM

## 2016-12-27 NOTE — Telephone Encounter (Signed)
Scheduled appt per 7/9 los - Gave patient AVS and calender per los.

## 2016-12-27 NOTE — Progress Notes (Signed)
START ON PATHWAY REGIMEN - Non-Small Cell Lung     Administer weekly:     Paclitaxel      Carboplatin   **Always confirm dose/schedule in your pharmacy ordering system**    Patient Characteristics: Stage IIA/IIB - Unresectable AJCC T Category: T2b Current Disease Status: No Distant Mets or Local Recurrence AJCC N Category: N1 AJCC M Category: M0 AJCC 8 Stage Grouping: IIB Intent of Therapy: Curative Intent, Discussed with Patient

## 2016-12-28 ENCOUNTER — Encounter: Payer: Self-pay | Admitting: Radiation Oncology

## 2016-12-28 ENCOUNTER — Telehealth: Payer: Self-pay | Admitting: Internal Medicine

## 2016-12-28 NOTE — Telephone Encounter (Signed)
Cancelled appt per MM - treatment to start after Rad onc consult - patient aware.

## 2016-12-29 ENCOUNTER — Encounter: Payer: Self-pay | Admitting: *Deleted

## 2017-01-04 ENCOUNTER — Other Ambulatory Visit: Payer: Medicare Other

## 2017-01-06 NOTE — Progress Notes (Signed)
Thoracic Location of Tumor / Histology: recurrent lung cancer in the left lung  Patient presented with symptoms of:  In February 2018 the patient had a chest x-ray that showed masslike opacity measuring 3.0 cm in the left lung.   Biopsies revealed:   09/23/16   Tobacco/Marijuana/Snuff/ETOH use: former smoker, quit in 2007, smoked 1 ppd for 30 years, denies ETOH, marijuana, snuff use.  Past/Anticipated interventions by cardiothoracic surgery, if any: 2008 right pneumonectomy   Past/Anticipated interventions by medical oncology, if any: 4 cycles of adjuvant systemic chemotherapy in 2008.  Dr. Julien Nordmann strongly recommends for the patient a course of concurrent chemoradiation with weekly carboplatin for AUC of 2 and paclitaxel 45 MG/M2.  She will start chemo on 01/17/17.  Signs/Symptoms  Weight changes, if any: no  Respiratory complaints, if any: has a cough with yellow sputum.  She takes Equate cough suppressant 1-2 times a day.  Hemoptysis, if any: no  Pain issues, if any:  no  SAFETY ISSUES:  Prior radiation? She is not sure if she had radiation to her right lung as she can't remember what happened for about a year.  Pacemaker/ICD? no   Possible current pregnancy?no  Is the patient on methotrexate? no  Current Complaints / other details:  Patient moved her a month and a half ago from [facility], [place].  BP 130/70 (BP Location: Left Arm, Patient Position: Sitting)   Pulse 69   Temp 98.4 F (36.9 C) (Oral)   Ht 5' 2.5" (1.588 m)   Wt 150 lb 12.8 oz (68.4 kg)   SpO2 97%   BMI 27.14 kg/m    Wt Readings from Last 3 Encounters:  01/12/17 150 lb 12.8 oz (68.4 kg)  12/27/16 149 lb 9.6 oz (67.9 kg)  12/17/16 148 lb 4 oz (67.2 kg)

## 2017-01-11 ENCOUNTER — Other Ambulatory Visit: Payer: Medicare Other

## 2017-01-11 ENCOUNTER — Ambulatory Visit: Payer: Medicare Other

## 2017-01-12 ENCOUNTER — Encounter: Payer: Self-pay | Admitting: Radiation Oncology

## 2017-01-12 ENCOUNTER — Encounter: Payer: Self-pay | Admitting: *Deleted

## 2017-01-12 ENCOUNTER — Ambulatory Visit
Admission: RE | Admit: 2017-01-12 | Discharge: 2017-01-12 | Disposition: A | Discharge status: Home / Self Care | Payer: Medicare Other | Source: Ambulatory Visit | Attending: Radiation Oncology | Admitting: Radiation Oncology

## 2017-01-12 ENCOUNTER — Encounter: Payer: Self-pay | Admitting: Oncology

## 2017-01-12 ENCOUNTER — Telehealth: Payer: Self-pay | Admitting: *Deleted

## 2017-01-12 VITALS — BP 130/70 | HR 69 | Temp 98.4°F | Ht 62.5 in | Wt 150.8 lb

## 2017-01-12 DIAGNOSIS — Z8673 Personal history of transient ischemic attack (TIA), and cerebral infarction without residual deficits: Secondary | ICD-10-CM | POA: Insufficient documentation

## 2017-01-12 DIAGNOSIS — E785 Hyperlipidemia, unspecified: Secondary | ICD-10-CM | POA: Diagnosis not present

## 2017-01-12 DIAGNOSIS — Z87891 Personal history of nicotine dependence: Secondary | ICD-10-CM | POA: Diagnosis not present

## 2017-01-12 DIAGNOSIS — Z85118 Personal history of other malignant neoplasm of bronchus and lung: Secondary | ICD-10-CM | POA: Diagnosis not present

## 2017-01-12 DIAGNOSIS — C3412 Malignant neoplasm of upper lobe, left bronchus or lung: Secondary | ICD-10-CM | POA: Diagnosis not present

## 2017-01-12 DIAGNOSIS — Z902 Acquired absence of lung [part of]: Secondary | ICD-10-CM | POA: Diagnosis not present

## 2017-01-12 DIAGNOSIS — C3492 Malignant neoplasm of unspecified part of left bronchus or lung: Secondary | ICD-10-CM | POA: Diagnosis not present

## 2017-01-12 DIAGNOSIS — J449 Chronic obstructive pulmonary disease, unspecified: Secondary | ICD-10-CM | POA: Diagnosis not present

## 2017-01-12 DIAGNOSIS — I10 Essential (primary) hypertension: Secondary | ICD-10-CM | POA: Diagnosis not present

## 2017-01-12 NOTE — Telephone Encounter (Signed)
I called patient to update her on PFT's.  I was unable to reach.  I left vm message with my name and phone number to call.

## 2017-01-12 NOTE — Progress Notes (Signed)
Radiation Oncology         [phone] ________________________________  Initial Outpatient Consultation  Name: Brandilyn Nanninga MRN: [id number]  Date: 01/12/2017  DOB: 05-21-1956  IW:[id number], Malka So, MD  Curt Bears, MD   REFERRING PHYSICIAN: Curt Bears, MD  DIAGNOSIS: Stage IIB (T2b, N1, M0) non-small cell lung cancer, adenocarcinoma   HISTORY OF PRESENT ILLNESS::Riyanshi Naiomi Musto is a 61 y.o. female who is kindly referred to the clinic today by Dr. Julien Nordmann.  The patient was previously diagnosed with lung cancer in 2007 and was treated with right pneumonectomy and chemotherapy in 2008 in [facility]. She was followed for approximately 5 years without signs of recurrence.  More recently, the patient had a chest x-ray in February 2018 which showed a spiculated left upper lobe mass measuring 2.2 x 3.1 x 4.9 cm, in addition to suspicious left hilar adenopathy. Subsequent biopsy revealed primary lung adenocarcinoma, well-differentiated, acinar type. PET scan on 09/24/16 showed hypermetabolic left upper lobe lung mass in addition to left hilar lymphadenopathy.  On 12/21/16 the patient underwent CT chest with contrast, which showed no significant interval change in large LEFT upper lobe pulmonary mass. Postsurgical change consistent RIGHT pneumonectomy. Tracheal stent is not changed. New reticulonodular pattern in the LEFT lower lobe suggests mild pulmonary infection.  The patient consulted with Dr. Julien Nordmann on 12/27/16. He recommends a course of concurrent chemoradiation with weekly carboplatin for AUC of 2 and paclitaxel. She will begin chemo on 01/17/17.  The patient presents to the clinic today to discuss the role that radiation therapy may play in the treatment of her disease.   On review of systems, the patient denies weight changes. She reports a cough with yellow sputum, for which she takes Equate cough suppressant 1-2 times daily. She denies hemoptysis or pain  concerns. She reports occasional difficulty sleeping, but reports it does not affect her daily activities.  Of note, the patient moved to [place] from [place] recently to be closer to her brother and sister-in-law.  PREVIOUS RADIATION THERAPY: No  PAST MEDICAL HISTORY:  has a past medical history of Adenocarcinoma of left lung, stage 2 ([place]) (12/27/2016); Cancer [facility]); COPD (chronic obstructive pulmonary disease) ([place]); Encounter for antineoplastic chemotherapy (12/27/2016); Hyperlipidemia; Hypertension; and Stroke [facility]).    PAST SURGICAL HISTORY: Past Surgical History:  Procedure Laterality Date  . BREAST BIOPSY     several. Denies Hx of breast cancer.  . THORACOTOMY/LOBECTOMY Right 2008  . TONSILLECTOMY  1960    FAMILY HISTORY: family history includes Arthritis in her father and mother; Cancer in her brother, brother, mother, and sister; Hypertension in her brother and father; Mental retardation in her brother.  SOCIAL HISTORY:  reports that she quit smoking about 11 years ago. Her smoking use included Cigarettes. She has a 30.00 pack-year smoking history. She has never used smokeless tobacco. She reports that she does not drink alcohol or use drugs. Patient recently moved to [place] from [place] to be closer to family. 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 She does not notice any breathing issues.  ALLERGIES: Patient has no known allergies.  MEDICATIONS:  Current Outpatient Prescriptions  Medication Sig Dispense Refill  . aspirin EC 81 MG tablet Take 81 mg by mouth daily.    Marland Kitchen atorvastatin (LIPITOR) 40 MG tablet Take 40 mg by mouth daily.    Marland Kitchen Dextromethorphan HBr (COUGH SUPPRESSANT PO) Take by mouth.    . Fluticasone-Salmeterol (ADVAIR) 250-50 MCG/DOSE  AEPB Inhale 1 puff into the lungs 2 (two) times daily.    Marland Kitchen levothyroxine (SYNTHROID, LEVOTHROID) 75 MCG tablet Take 75 mcg by mouth daily before breakfast.    . omeprazole  (PRILOSEC) 20 MG capsule Take 20 mg by mouth daily.     No current facility-administered medications for this encounter.     REVIEW OF SYSTEMS:  A 10+ POINT REVIEW OF SYSTEMS WAS OBTAINED including neurology, dermatology, psychiatry, cardiac, respiratory, lymph, extremities, GI, GU, musculoskeletal, constitutional, reproductive, HEENT. All pertinent positives are noted in the HPI. All others are negative.   PHYSICAL EXAM:  height is 5' 2.5" (1.588 m) and weight is 150 lb 12.8 oz (68.4 kg). Her oral temperature is 98.4 F (36.9 C). Her blood pressure is 130/70 and her pulse is 69. Her oxygen saturation is 97%.   General: Alert and oriented, in no acute distress. HEENT: Head is normocephalic. Extraocular movements are intact. Oropharynx is clear. The patient has partial uppers which were not removed for the exam. Neck: Neck is supple, no palpable cervical or supraclavicular lymphadenopathy. Heart: Regular in rate and rhythm with no murmurs. Chest: Clear to auscultation bilaterally. Scar along the right chest wall area from her previous thoracotomy. There is erythema in this area consistent with a fungal infection for which the patient has a prescription. This area is "sunken in," and there is movement of the chest wall inward with the patient's breathing. Extremities: No edema. Distal pulses intact. Lymphatics: see Neck Exam Skin: No concerning lesions. Musculoskeletal: Some architectural distortion and imited movement of right hand. Symmetric strength and muscle tone throughout. Good range of motion in both upper extremities, though slightly reduced in the right shoulder. Neurologic: No obvious focalities. Speech is fluent. Coordination is intact. Psychiatric: Judgment and insight are intact. Affect is appropriate.   ECOG = 1    LABORATORY DATA:  Lab Results  Component Value Date   WBC 8.3 12/27/2016   HGB 11.8 12/27/2016   HCT 36.8 12/27/2016   MCV 86.6 12/27/2016   PLT 411 (H)  12/27/2016   NEUTROABS 6.1 12/27/2016   Lab Results  Component Value Date   NA 143 12/27/2016   K 3.7 12/27/2016   CL 102 11/04/2016   CO2 26 12/27/2016   GLUCOSE 97 12/27/2016   CREATININE 0.8 12/27/2016   CALCIUM 9.5 12/27/2016      RADIOGRAPHY: Ct Chest W Contrast  Result Date: 12/21/2016 CLINICAL DATA:  Lung cancer. Status post surgery and radiation therapy EXAM: CT CHEST WITH CONTRAST TECHNIQUE: Multidetector CT imaging of the chest was performed during intravenous contrast administration. CONTRAST:  55mL ISOVUE-300 IOPAMIDOL (ISOVUE-300) INJECTION 61% COMPARISON:  Outside CT 08/30/2016 FINDINGS: Cardiovascular: No significant vascular findings. Normal heart size. No pericardial effusion. Mediastinum/Nodes: No axillary or supraclavicular adenopathy. No mediastinal hilar adenopathy. Esophagus normal. Lungs/Pleura: RIGHT pneumonectomy anatomy. The LEFT lung is hyperexpanded into the RIGHT hemithorax. Expandable stent within the trachea is patent and not changed. Within the hyperinflated LEFT upper lobe lobular mass measures 52 x 31 mm compared to 49 x 30 mm for no significant change. No additional pulmonary nodules suggest metastatic disease. There is a segmental reticulonodular pattern in the LEFT lower lobe which is new from prior and suggests pulmonary infection. Upper Abdomen: Limited view of the liver, kidneys, pancreas are unremarkable. Normal adrenal glands. Musculoskeletal: No aggressive osseous lesion. IMPRESSION: 1. No significant interval change in large LEFT upper lobe pulmonary mass. 2. Postsurgical change consistent RIGHT pneumonectomy. Tracheal stent is not changed. 3. New reticulonodular  pattern in the LEFT lower lobe suggests mild pulmonary infection Electronically Signed   By: Suzy Bouchard M.D.   On: 12/21/2016 16:53      IMPRESSION:This is a very pleasant 61 years old white female with history of non-small cell lung cancer in 2007 status post right pneumonectomy in 2008.  The patient has a recent diagnosis of stage IIB (T2b, N1, M0) non-small cell lung cancer, adenocarcinoma presented with large left upper lobe lung mass in addition to left hilar lymphadenopathy diagnosed in April 2018. Her previous workup was done at [facility] in [facility]. There was insufficient material to perform the molecular studies.  Today, I talked to the patient about the findings and work-up thus far.  We discussed the natural history of recurrent lung cancer and general treatment, highlighting the role of radiotherapy in the management.  We discussed the available radiation techniques, and focused on the details of logistics and delivery.  We reviewed the anticipated acute and late sequelae associated with radiation in this setting.  The patient was encouraged to ask questions that I answered to the best of my ability.  The patient would like to proceed with radiation and will be scheduled for CT simulation. A consent form was discussed and signed today.   PLAN: The patient is scheduled for CT simulation and treatment planning tomorrow, 7/26 at 3 pm. The patient will likely begin RT 1 week following treatment planning.  I will order a lung function study to assess the patient's lung function in light of her previous right pneumonectomy and adjust treatment plans as needed.  I will discuss the timing of the patient's chemotherapy with Dr. Julien Nordmann to confirm the timing of her concurrent chemoradiation.     ------------------------------------------------  Blair Promise, PhD, MD  This document serves as a record of services personally performed by Gery Pray, MD. It was created on his behalf by Maryla Morrow, a trained medical scribe. The creation of this record is based on the scribe's personal observations and the provider's statements to them. This document has been checked and approved by the attending provider.

## 2017-01-12 NOTE — Progress Notes (Signed)
Please see the Nurse Progress Note in the MD Initial Consult Encounter for this patient. 

## 2017-01-12 NOTE — Progress Notes (Signed)
Oncology Nurse Navigator Documentation  Oncology Nurse Navigator Flowsheets 01/12/2017  Navigator Location [facility]  Navigator Encounter Type Other/per Dr. Sondra Come, he would like patient to have PFT's.  Order completed and I called [facility] for an appt.  I will updated patient on appt and pre-procedure instructions.   Patient Visit Type RadOnc  Treatment Phase Pre-Tx/Tx Discussion  Barriers/Navigation Needs Coordination of Care  Interventions Coordination of Care  Coordination of Care Other  Acuity Level 2  Time Spent with Patient 15

## 2017-01-13 ENCOUNTER — Telehealth: Payer: Self-pay | Admitting: *Deleted

## 2017-01-13 ENCOUNTER — Ambulatory Visit
Admission: RE | Admit: 2017-01-13 | Discharge: 2017-01-13 | Disposition: A | Discharge status: Home / Self Care | Payer: Medicare Other | Source: Ambulatory Visit | Attending: Radiation Oncology | Admitting: Radiation Oncology

## 2017-01-13 DIAGNOSIS — I10 Essential (primary) hypertension: Secondary | ICD-10-CM | POA: Diagnosis not present

## 2017-01-13 DIAGNOSIS — C3412 Malignant neoplasm of upper lobe, left bronchus or lung: Secondary | ICD-10-CM | POA: Diagnosis not present

## 2017-01-13 DIAGNOSIS — Z87891 Personal history of nicotine dependence: Secondary | ICD-10-CM | POA: Diagnosis not present

## 2017-01-13 DIAGNOSIS — C3492 Malignant neoplasm of unspecified part of left bronchus or lung: Secondary | ICD-10-CM | POA: Diagnosis not present

## 2017-01-13 DIAGNOSIS — Z8673 Personal history of transient ischemic attack (TIA), and cerebral infarction without residual deficits: Secondary | ICD-10-CM | POA: Diagnosis not present

## 2017-01-13 DIAGNOSIS — E785 Hyperlipidemia, unspecified: Secondary | ICD-10-CM | POA: Diagnosis not present

## 2017-01-13 DIAGNOSIS — J449 Chronic obstructive pulmonary disease, unspecified: Secondary | ICD-10-CM | POA: Diagnosis not present

## 2017-01-13 NOTE — Telephone Encounter (Signed)
Oncology Nurse Navigator Documentation  Oncology Nurse Navigator Flowsheets 01/13/2017  Navigator Location [facility]  Navigator Encounter Type Telephone/I received a message from Dr. Julien Nordmann regarding treatment.  He would like her to start chemo on 01/24/17 due to XRT starting on 8/2.  I will cancel chemo and labs on 7/30.  I called Felicia Herrera to update. I left vm message with an update. I also left my name and phone number to call if she had more questions.   Telephone Outgoing Call  Barriers/Navigation Needs Education;Coordination of Care  Education Other  Interventions Education;Coordination of Care  Coordination of Care Other  Education Method Verbal  Acuity Level 2  Time Spent with Patient 30

## 2017-01-13 NOTE — Progress Notes (Signed)
  Radiation Oncology         [phone] ________________________________  Name: Felicia Herrera MRN: [id number]  Date: 01/13/2017  DOB: [birth year]  SIMULATION AND TREATMENT PLANNING NOTE    ICD-10-CM   1. Adenocarcinoma of left lung, stage 2 (HCC) C34.92     DIAGNOSIS:  Stage IIB (T2b, N1, M0) non-small cell lung cancer, adenocarcinoma   NARRATIVE:  The patient was brought to the [place].  Identity was confirmed.  All relevant records and images related to the planned course of therapy were reviewed.  The patient freely provided informed written consent to proceed with treatment after reviewing the details related to the planned course of therapy. The consent form was witnessed and verified by the simulation staff.  Then, the patient was set-up in a stable reproducible  supine position for radiation therapy.  CT images were obtained.  Surface markings were placed.  The CT images were loaded into the planning software.  Then the target and avoidance structures were contoured.  Treatment planning then occurred.  The radiation prescription was entered and confirmed.  Then, I designed and supervised the construction of a total of 2 medically necessary complex treatment devices, including a BodyFix immobilization mold custom fitted to the patient along with  shaped radiation around the treatment target while shielding critical structures such as the heart and spinal cord maximally.  I have requested :IMRT in light of the patient's prior right pneumonectomy and significantly altered anatomy  I have requested a DVH of the following structures: Left lung, right lung, spinal cord, heart, esophagus, and target.  I have ordered:dose calc.   SPECIAL TREATMENT PROCEDURE:  The planned course of therapy using radiation constitutes a special treatment procedure. Special care is required in the management of this patient for the following reasons.  The patient will be receiving concurrent  chemotherapy requiring careful monitoring for increased toxicities of treatment including periodic laboratory values.  The special nature of the planned course of radiotherapy will require increased physician supervision and oversight to ensure patient's safety with optimal treatment outcomes.  PLAN:  The patient will receive 60 Gy in 30 fractions.  -----------------------------------  Blair Promise, PhD, MD  This document serves as a record of services personally performed by Gery Pray, MD. It was created on his behalf by Arlyce Harman, a trained medical scribe. The creation of this record is based on the scribe's personal observations and the provider's statements to them. This document has been checked and approved by the attending provider.

## 2017-01-13 NOTE — Telephone Encounter (Signed)
Oncology Nurse Navigator Documentation  Oncology Nurse Navigator Flowsheets 01/13/2017  Navigator Location [facility]  Navigator Encounter Type Telephone  Telephone Incoming Call/Felicia Herrera called today. I updated her on appt for PFT's and pre-procedure instructions.  She verbalized understanding.   She had questions regarding when chemo will start.  I stated I would follow up with her after getting in touch with Dr. Julien Nordmann.   Treatment Phase [facility]  Barriers/Navigation Needs Education;Coordination of Care  Education Other  Interventions Coordination of Care;Education  Coordination of Care Other  Education Method Verbal  Acuity Level 2  Time Spent with Patient 30

## 2017-01-17 ENCOUNTER — Other Ambulatory Visit: Payer: Medicare Other

## 2017-01-17 ENCOUNTER — Ambulatory Visit: Payer: Medicare Other

## 2017-01-17 ENCOUNTER — Ambulatory Visit (HOSPITAL_COMMUNITY)
Admission: RE | Admit: 2017-01-17 | Discharge: 2017-01-17 | Disposition: A | Discharge status: Home / Self Care | Payer: Medicare Other | Source: Ambulatory Visit | Attending: Radiation Oncology | Admitting: Radiation Oncology

## 2017-01-17 DIAGNOSIS — J449 Chronic obstructive pulmonary disease, unspecified: Secondary | ICD-10-CM | POA: Insufficient documentation

## 2017-01-17 DIAGNOSIS — Z87891 Personal history of nicotine dependence: Secondary | ICD-10-CM | POA: Diagnosis not present

## 2017-01-17 DIAGNOSIS — C3492 Malignant neoplasm of unspecified part of left bronchus or lung: Secondary | ICD-10-CM

## 2017-01-17 DIAGNOSIS — R942 Abnormal results of pulmonary function studies: Secondary | ICD-10-CM | POA: Diagnosis not present

## 2017-01-17 LAB — PULMONARY FUNCTION TEST
DL/VA % PRED: 70 %
DL/VA: 3.22 ml/min/mmHg/L
DLCO UNC % PRED: 48 %
DLCO UNC: 10.43 ml/min/mmHg
FEF 25-75 POST: 0.81 L/s
FEF 25-75 PRE: 0.53 L/s
FEF2575-%Change-Post: 52 %
FEF2575-%PRED-PRE: 23 %
FEF2575-%Pred-Post: 35 %
FEV1-%Change-Post: 15 %
FEV1-%PRED-POST: 52 %
FEV1-%Pred-Pre: 45 %
FEV1-PRE: 1.08 L
FEV1-Post: 1.25 L
FEV1FVC-%Change-Post: 3 %
FEV1FVC-%PRED-PRE: 75 %
FEV6-%CHANGE-POST: 10 %
FEV6-%PRED-PRE: 61 %
FEV6-%Pred-Post: 68 %
FEV6-POST: 2.01 L
FEV6-Pre: 1.83 L
FEV6FVC-%CHANGE-POST: -2 %
FEV6FVC-%PRED-POST: 101 %
FEV6FVC-%Pred-Pre: 103 %
FVC-%Change-Post: 12 %
FVC-%Pred-Post: 67 %
FVC-%Pred-Pre: 59 %
FVC-Post: 2.07 L
FVC-Pre: 1.84 L
POST FEV6/FVC RATIO: 97 %
PRE FEV1/FVC RATIO: 59 %
Post FEV1/FVC ratio: 60 %
Pre FEV6/FVC Ratio: 100 %
RV % pred: 102 %
RV: 1.94 L
TLC % PRED: 85 %
TLC: 4.04 L

## 2017-01-17 MED ORDER — ALBUTEROL SULFATE (2.5 MG/3ML) 0.083% IN NEBU
2.5000 mg | INHALATION_SOLUTION | Freq: Once | RESPIRATORY_TRACT | Status: AC
  Administered 2017-01-17: 2.5 mg via RESPIRATORY_TRACT

## 2017-01-19 DIAGNOSIS — C3492 Malignant neoplasm of unspecified part of left bronchus or lung: Secondary | ICD-10-CM | POA: Diagnosis not present

## 2017-01-19 DIAGNOSIS — Z8673 Personal history of transient ischemic attack (TIA), and cerebral infarction without residual deficits: Secondary | ICD-10-CM | POA: Diagnosis not present

## 2017-01-19 DIAGNOSIS — Z87891 Personal history of nicotine dependence: Secondary | ICD-10-CM | POA: Diagnosis not present

## 2017-01-19 DIAGNOSIS — E785 Hyperlipidemia, unspecified: Secondary | ICD-10-CM | POA: Diagnosis not present

## 2017-01-19 DIAGNOSIS — C3412 Malignant neoplasm of upper lobe, left bronchus or lung: Secondary | ICD-10-CM | POA: Diagnosis not present

## 2017-01-19 DIAGNOSIS — I10 Essential (primary) hypertension: Secondary | ICD-10-CM | POA: Diagnosis not present

## 2017-01-19 DIAGNOSIS — J449 Chronic obstructive pulmonary disease, unspecified: Secondary | ICD-10-CM | POA: Diagnosis not present

## 2017-01-20 ENCOUNTER — Encounter: Payer: Self-pay | Admitting: *Deleted

## 2017-01-20 ENCOUNTER — Telehealth: Payer: Self-pay | Admitting: Family Medicine

## 2017-01-20 DIAGNOSIS — C3492 Malignant neoplasm of unspecified part of left bronchus or lung: Secondary | ICD-10-CM | POA: Diagnosis not present

## 2017-01-20 DIAGNOSIS — E785 Hyperlipidemia, unspecified: Secondary | ICD-10-CM | POA: Diagnosis not present

## 2017-01-20 DIAGNOSIS — I10 Essential (primary) hypertension: Secondary | ICD-10-CM | POA: Diagnosis not present

## 2017-01-20 DIAGNOSIS — Z8673 Personal history of transient ischemic attack (TIA), and cerebral infarction without residual deficits: Secondary | ICD-10-CM | POA: Diagnosis not present

## 2017-01-20 DIAGNOSIS — J449 Chronic obstructive pulmonary disease, unspecified: Secondary | ICD-10-CM | POA: Diagnosis not present

## 2017-01-20 DIAGNOSIS — Z87891 Personal history of nicotine dependence: Secondary | ICD-10-CM | POA: Diagnosis not present

## 2017-01-20 NOTE — Telephone Encounter (Signed)
° ° ° ° ° °  Pt request refill of the following:  Fluticasone-Salmeterol (ADVAIR) 250-50 MCG/DOSE AEPB   Phamacy:  [facility]

## 2017-01-20 NOTE — Progress Notes (Signed)
[place] Psychosocial Distress Screening Clinical Social Work  Clinical Social Work was referred by radiation oncology financial advocate and distress screening protocol.  The patient scored a 5 on the Psychosocial Distress Thermometer which indicates moderate distress. Clinical Social Worker met with patient in [place] office to assess for distress and other psychosocial needs. Ms. Granberry stated she no longer has anxiety since beginning treatment.  CSW reviewed support services and programs- patient was interested in participating in programs in the future.  CSW signed up patient for [employer] to assist with transportation costs.  ONCBCN DISTRESS SCREENING 01/12/2017  Screening Type Initial Screening  Distress experienced in past week (1-10) 5  Emotional problem type Nervousness/Anxiety;Adjusting to illness  Spiritual/Religous concerns type Relating to God    Maryjean Morn, MSW, LCSW, OSW-C Clinical Social Worker [facility] [phone]

## 2017-01-21 ENCOUNTER — Telehealth: Payer: Self-pay

## 2017-01-21 MED ORDER — FLUTICASONE-SALMETEROL 250-50 MCG/DOSE IN AEPB: 1.0000 | INHALATION_SPRAY | Freq: Two times a day (BID) | RESPIRATORY_TRACT | 3 refills | Status: DC

## 2017-01-21 NOTE — Telephone Encounter (Signed)
Received PA request for Advair. PA submitted & is pending. Key:  [id number]

## 2017-01-21 NOTE — Telephone Encounter (Signed)
Rx sent in

## 2017-01-24 ENCOUNTER — Encounter: Payer: Self-pay | Admitting: Internal Medicine

## 2017-01-24 ENCOUNTER — Other Ambulatory Visit: Payer: Self-pay

## 2017-01-24 ENCOUNTER — Ambulatory Visit (HOSPITAL_BASED_OUTPATIENT_CLINIC_OR_DEPARTMENT_OTHER): Payer: Medicare Other

## 2017-01-24 ENCOUNTER — Ambulatory Visit (HOSPITAL_BASED_OUTPATIENT_CLINIC_OR_DEPARTMENT_OTHER): Payer: Medicare Other | Admitting: Internal Medicine

## 2017-01-24 ENCOUNTER — Ambulatory Visit
Admission: RE | Admit: 2017-01-24 | Discharge: 2017-01-24 | Disposition: A | Discharge status: Home / Self Care | Payer: Medicare Other | Source: Ambulatory Visit | Attending: Radiation Oncology | Admitting: Radiation Oncology

## 2017-01-24 ENCOUNTER — Telehealth: Payer: Self-pay | Admitting: Internal Medicine

## 2017-01-24 ENCOUNTER — Other Ambulatory Visit (HOSPITAL_BASED_OUTPATIENT_CLINIC_OR_DEPARTMENT_OTHER): Payer: Medicare Other

## 2017-01-24 VITALS — BP 143/62 | HR 69 | Temp 98.2°F | Resp 18 | Ht 62.5 in | Wt 149.3 lb

## 2017-01-24 VITALS — BP 138/116 | HR 55 | Temp 98.6°F | Resp 18

## 2017-01-24 DIAGNOSIS — Z85118 Personal history of other malignant neoplasm of bronchus and lung: Secondary | ICD-10-CM

## 2017-01-24 DIAGNOSIS — C3492 Malignant neoplasm of unspecified part of left bronchus or lung: Secondary | ICD-10-CM

## 2017-01-24 DIAGNOSIS — E785 Hyperlipidemia, unspecified: Secondary | ICD-10-CM | POA: Diagnosis not present

## 2017-01-24 DIAGNOSIS — I10 Essential (primary) hypertension: Secondary | ICD-10-CM | POA: Diagnosis not present

## 2017-01-24 DIAGNOSIS — Z8673 Personal history of transient ischemic attack (TIA), and cerebral infarction without residual deficits: Secondary | ICD-10-CM | POA: Diagnosis not present

## 2017-01-24 DIAGNOSIS — C3412 Malignant neoplasm of upper lobe, left bronchus or lung: Secondary | ICD-10-CM

## 2017-01-24 DIAGNOSIS — J449 Chronic obstructive pulmonary disease, unspecified: Secondary | ICD-10-CM

## 2017-01-24 DIAGNOSIS — Z5111 Encounter for antineoplastic chemotherapy: Secondary | ICD-10-CM | POA: Diagnosis present

## 2017-01-24 DIAGNOSIS — Z87891 Personal history of nicotine dependence: Secondary | ICD-10-CM | POA: Diagnosis not present

## 2017-01-24 LAB — COMPREHENSIVE METABOLIC PANEL
ALT: 12 U/L (ref 0–55)
AST: 14 U/L (ref 5–34)
Albumin: 3.7 g/dL (ref 3.5–5.0)
Alkaline Phosphatase: 92 U/L (ref 40–150)
Anion Gap: 7 mEq/L (ref 3–11)
BUN: 12.6 mg/dL (ref 7.0–26.0)
CHLORIDE: 107 meq/L (ref 98–109)
CO2: 28 mEq/L (ref 22–29)
Calcium: 9.7 mg/dL (ref 8.4–10.4)
Creatinine: 0.8 mg/dL (ref 0.6–1.1)
EGFR: 79 mL/min/{1.73_m2} — ABNORMAL LOW (ref >90)
GLUCOSE: 99 mg/dL (ref 70–140)
POTASSIUM: 4.5 meq/L (ref 3.5–5.1)
SODIUM: 142 meq/L (ref 136–145)
Total Bilirubin: 0.42 mg/dL (ref 0.20–1.20)
Total Protein: 7.2 g/dL (ref 6.4–8.3)

## 2017-01-24 LAB — CBC WITH DIFFERENTIAL/PLATELET
BASO%: 0.8 % (ref 0.0–2.0)
BASOS ABS: 0.1 10*3/uL (ref 0.0–0.1)
EOS%: 1.8 % (ref 0.0–7.0)
Eosinophils Absolute: 0.1 10*3/uL (ref 0.0–0.5)
HCT: 37.6 % (ref 34.8–46.6)
HGB: 12.2 g/dL (ref 11.6–15.9)
LYMPH%: 20.2 % (ref 14.0–49.7)
MCH: 27.4 pg (ref 25.1–34.0)
MCHC: 32.5 g/dL (ref 31.5–36.0)
MCV: 84.2 fL (ref 79.5–101.0)
MONO#: 0.5 10*3/uL (ref 0.1–0.9)
MONO%: 7.1 % (ref 0.0–14.0)
NEUT#: 5.3 10*3/uL (ref 1.5–6.5)
NEUT%: 70.1 % (ref 38.4–76.8)
Platelets: 424 10*3/uL — ABNORMAL HIGH (ref 145–400)
RBC: 4.46 10*6/uL (ref 3.70–5.45)
RDW: 13.5 % (ref 11.2–14.5)
WBC: 7.6 10*3/uL (ref 3.9–10.3)
lymph#: 1.5 10*3/uL (ref 0.9–3.3)

## 2017-01-24 MED ORDER — SODIUM CHLORIDE 0.9 % IV SOLN
212.4000 mg | Freq: Once | INTRAVENOUS | Status: AC
  Administered 2017-01-24: 210 mg via INTRAVENOUS
  Filled 2017-01-24: qty 21

## 2017-01-24 MED ORDER — PALONOSETRON HCL INJECTION 0.25 MG/5ML
0.2500 mg | Freq: Once | INTRAVENOUS | Status: AC
  Administered 2017-01-24: 0.25 mg via INTRAVENOUS

## 2017-01-24 MED ORDER — FAMOTIDINE IN NACL 20-0.9 MG/50ML-% IV SOLN
INTRAVENOUS | Status: AC
  Filled 2017-01-24: qty 50

## 2017-01-24 MED ORDER — DIPHENHYDRAMINE HCL 50 MG/ML IJ SOLN
INTRAMUSCULAR | Status: AC
  Filled 2017-01-24: qty 1

## 2017-01-24 MED ORDER — SODIUM CHLORIDE 0.9 % IV SOLN
20.0000 mg | Freq: Once | INTRAVENOUS | Status: AC
  Administered 2017-01-24: 20 mg via INTRAVENOUS
  Filled 2017-01-24: qty 2

## 2017-01-24 MED ORDER — FAMOTIDINE IN NACL 20-0.9 MG/50ML-% IV SOLN
20.0000 mg | Freq: Once | INTRAVENOUS | Status: AC
  Administered 2017-01-24: 20 mg via INTRAVENOUS

## 2017-01-24 MED ORDER — HEPARIN SOD (PORK) LOCK FLUSH 100 UNIT/ML IV SOLN
500.0000 [IU] | Freq: Once | INTRAVENOUS | Status: DC | PRN
  Filled 2017-01-24: qty 5

## 2017-01-24 MED ORDER — SODIUM CHLORIDE 0.9 % IV SOLN
Freq: Once | INTRAVENOUS | Status: AC
  Administered 2017-01-24: 12:00:00 via INTRAVENOUS

## 2017-01-24 MED ORDER — PALONOSETRON HCL INJECTION 0.25 MG/5ML
INTRAVENOUS | Status: AC
  Filled 2017-01-24: qty 5

## 2017-01-24 MED ORDER — PACLITAXEL CHEMO INJECTION 300 MG/50ML
45.0000 mg/m2 | Freq: Once | INTRAVENOUS | Status: AC
  Administered 2017-01-24: 78 mg via INTRAVENOUS
  Filled 2017-01-24: qty 13

## 2017-01-24 MED ORDER — DIPHENHYDRAMINE HCL 50 MG/ML IJ SOLN
50.0000 mg | Freq: Once | INTRAMUSCULAR | Status: AC
  Administered 2017-01-24: 50 mg via INTRAVENOUS

## 2017-01-24 MED ORDER — PROCHLORPERAZINE MALEATE 10 MG PO TABS: 10.0000 mg | ORAL_TABLET | Freq: Four times a day (QID) | ORAL | 0 refills | Status: DC | PRN

## 2017-01-24 MED ORDER — SODIUM CHLORIDE 0.9% FLUSH
10.0000 mL | INTRAVENOUS | Status: DC | PRN
  Filled 2017-01-24: qty 10

## 2017-01-24 NOTE — Progress Notes (Signed)
  Radiation Oncology         [phone] ________________________________  Name: Felicia Herrera MRN: [id number]  Date: 01/24/2017  DOB: 1955/06/29  Simulation Verification Note    ICD-10-CM   1. Adenocarcinoma of left lung, stage 2 (HCC) C34.92   2. Malignant neoplasm of left lung, unspecified part of lung ([place]) C34.92     Status: outpatient  NARRATIVE: The patient was brought to the treatment unit and placed in the planned treatment position. The clinical setup was verified. Then port films were obtained and uploaded to the radiation oncology medical record software.  The treatment beams were carefully compared against the planned radiation fields. The position location and shape of the radiation fields was reviewed. They targeted volume of tissue appears to be appropriately covered by the radiation beams. Organs at risk appear to be excluded as planned.  Based on my personal review, I approved the simulation verification. The patient's treatment will proceed as planned.  -----------------------------------  Blair Promise, PhD, MD

## 2017-01-24 NOTE — Telephone Encounter (Signed)
PA denied. Patient has to first try five of the following covered medications:  Anoro Ellipta Bevespi Aerosphere Breo Ellipta Serevent Diskus Spiriva Handihaler or Spiriva Respimat Stiolto Respimat Symbicort

## 2017-01-24 NOTE — Telephone Encounter (Signed)
She is now following with pulmonologist. Thanks, BJ

## 2017-01-24 NOTE — Patient Instructions (Signed)
[facility] Discharge Instructions for Patients Receiving Chemotherapy  Today you received the following chemotherapy agents paclitaxel (Taxol) and carboplatin.  To help prevent nausea and vomiting after your treatment, we encourage you to take your nausea medication as directed by your doctor. If you develop nausea and vomiting that is not controlled by your nausea medication, call the clinic.   BELOW ARE SYMPTOMS THAT SHOULD BE REPORTED IMMEDIATELY:  *FEVER GREATER THAN 100.5 F  *CHILLS WITH OR WITHOUT FEVER  NAUSEA AND VOMITING THAT IS NOT CONTROLLED WITH YOUR NAUSEA MEDICATION  *UNUSUAL SHORTNESS OF BREATH  *UNUSUAL BRUISING OR BLEEDING  TENDERNESS IN MOUTH AND THROAT WITH OR WITHOUT PRESENCE OF ULCERS  *URINARY PROBLEMS  *BOWEL PROBLEMS  UNUSUAL RASH Items with * indicate a potential emergency and should be followed up as soon as possible.  Feel free to call the clinic you have any questions or concerns. The clinic phone number is [phone].  Please show the [place] at check-in to the Emergency Department and triage nurse.  Paclitaxel injection What is this medicine? PACLITAXEL (PAK li TAX el) is a chemotherapy drug. It targets fast dividing cells, like cancer cells, and causes these cells to die. This medicine is used to treat ovarian cancer, breast cancer, and other cancers. This medicine may be used for other purposes; ask your health care provider or pharmacist if you have questions. COMMON BRAND NAME(S): Onxol, Taxol What should I tell my health care provider before I take this medicine? They need to know if you have any of these conditions: -blood disorders -irregular heartbeat -infection (especially a virus infection such as chickenpox, cold sores, or herpes) -liver disease -previous or ongoing radiation therapy -an unusual or allergic reaction to paclitaxel, alcohol, polyoxyethylated castor oil, other chemotherapy agents, other  medicines, foods, dyes, or preservatives -pregnant or trying to get pregnant -breast-feeding How should I use this medicine? This drug is given as an infusion into a vein. It is administered in a hospital or clinic by a specially trained health care professional. Talk to your pediatrician regarding the use of this medicine in children. Special care may be needed. Overdosage: If you think you have taken too much of this medicine contact a poison control center or emergency room at once. NOTE: This medicine is only for you. Do not share this medicine with others. What if I miss a dose? It is important not to miss your dose. Call your doctor or health care professional if you are unable to keep an appointment. What may interact with this medicine? Do not take this medicine with any of the following medications: -disulfiram -metronidazole This medicine may also interact with the following medications: -cyclosporine -diazepam -ketoconazole -medicines to increase blood counts like filgrastim, pegfilgrastim, sargramostim -other chemotherapy drugs like cisplatin, doxorubicin, epirubicin, etoposide, teniposide, vincristine -quinidine -testosterone -vaccines -verapamil Talk to your doctor or health care professional before taking any of these medicines: -acetaminophen -aspirin -ibuprofen -ketoprofen -naproxen This list may not describe all possible interactions. Give your health care provider a list of all the medicines, herbs, non-prescription drugs, or dietary supplements you use. Also tell them if you smoke, drink alcohol, or use illegal drugs. Some items may interact with your medicine. What should I watch for while using this medicine? Your condition will be monitored carefully while you are receiving this medicine. You will need important blood work done while you are taking this medicine. This medicine can cause serious allergic reactions. To reduce your risk you will  need to take other  medicine(s) before treatment with this medicine. If you experience allergic reactions like skin rash, itching or hives, swelling of the face, lips, or tongue, tell your doctor or health care professional right away. In some cases, you may be given additional medicines to help with side effects. Follow all directions for their use. This drug may make you feel generally unwell. This is not uncommon, as chemotherapy can affect healthy cells as well as cancer cells. Report any side effects. Continue your course of treatment even though you feel ill unless your doctor tells you to stop. Call your doctor or health care professional for advice if you get a fever, chills or sore throat, or other symptoms of a cold or flu. Do not treat yourself. This drug decreases your body's ability to fight infections. Try to avoid being around people who are sick. This medicine may increase your risk to bruise or bleed. Call your doctor or health care professional if you notice any unusual bleeding. Be careful brushing and flossing your teeth or using a toothpick because you may get an infection or bleed more easily. If you have any dental work done, tell your dentist you are receiving this medicine. Avoid taking products that contain aspirin, acetaminophen, ibuprofen, naproxen, or ketoprofen unless instructed by your doctor. These medicines may hide a fever. Do not become pregnant while taking this medicine. Women should inform their doctor if they wish to become pregnant or think they might be pregnant. There is a potential for serious side effects to an unborn child. Talk to your health care professional or pharmacist for more information. Do not breast-feed an infant while taking this medicine. Men are advised not to father a child while receiving this medicine. This product may contain alcohol. Ask your pharmacist or healthcare provider if this medicine contains alcohol. Be sure to tell all healthcare providers you are  taking this medicine. Certain medicines, like metronidazole and disulfiram, can cause an unpleasant reaction when taken with alcohol. The reaction includes flushing, headache, nausea, vomiting, sweating, and increased thirst. The reaction can last from 30 minutes to several hours. What side effects may I notice from receiving this medicine? Side effects that you should report to your doctor or health care professional as soon as possible: -allergic reactions like skin rash, itching or hives, swelling of the face, lips, or tongue -low blood counts - This drug may decrease the number of white blood cells, red blood cells and platelets. You may be at increased risk for infections and bleeding. -signs of infection - fever or chills, cough, sore throat, pain or difficulty passing urine -signs of decreased platelets or bleeding - bruising, pinpoint red spots on the skin, black, tarry stools, nosebleeds -signs of decreased red blood cells - unusually weak or tired, fainting spells, lightheadedness -breathing problems -chest pain -high or low blood pressure -mouth sores -nausea and vomiting -pain, swelling, redness or irritation at the injection site -pain, tingling, numbness in the hands or feet -slow or irregular heartbeat -swelling of the ankle, feet, hands Side effects that usually do not require medical attention (report to your doctor or health care professional if they continue or are bothersome): -bone pain -complete hair loss including hair on your head, underarms, pubic hair, eyebrows, and eyelashes -changes in the color of fingernails -diarrhea -loosening of the fingernails -loss of appetite -muscle or joint pain -red flush to skin -sweating This list may not describe all possible side effects. Call your doctor for medical  advice about side effects. You may report side effects to FDA at 1-800-FDA-1088. Where should I keep my medicine? This drug is given in a hospital or clinic and  will not be stored at home. NOTE: This sheet is a summary. It may not cover all possible information. If you have questions about this medicine, talk to your doctor, pharmacist, or health care provider.  2018 Elsevier/Gold Standard (2015-04-08 19:58:00)   Carboplatin injection What is this medicine? CARBOPLATIN (KAR boe pla tin) is a chemotherapy drug. It targets fast dividing cells, like cancer cells, and causes these cells to die. This medicine is used to treat ovarian cancer and many other cancers. This medicine may be used for other purposes; ask your health care provider or pharmacist if you have questions. COMMON BRAND NAME(S): Paraplatin What should I tell my health care provider before I take this medicine? They need to know if you have any of these conditions: -blood disorders -hearing problems -kidney disease -recent or ongoing radiation therapy -an unusual or allergic reaction to carboplatin, cisplatin, other chemotherapy, other medicines, foods, dyes, or preservatives -pregnant or trying to get pregnant -breast-feeding How should I use this medicine? This drug is usually given as an infusion into a vein. It is administered in a hospital or clinic by a specially trained health care professional. Talk to your pediatrician regarding the use of this medicine in children. Special care may be needed. Overdosage: If you think you have taken too much of this medicine contact a poison control center or emergency room at once. NOTE: This medicine is only for you. Do not share this medicine with others. What if I miss a dose? It is important not to miss a dose. Call your doctor or health care professional if you are unable to keep an appointment. What may interact with this medicine? -medicines for seizures -medicines to increase blood counts like filgrastim, pegfilgrastim, sargramostim -some antibiotics like amikacin, gentamicin, neomycin, streptomycin, tobramycin -vaccines Talk to  your doctor or health care professional before taking any of these medicines: -acetaminophen -aspirin -ibuprofen -ketoprofen -naproxen This list may not describe all possible interactions. Give your health care provider a list of all the medicines, herbs, non-prescription drugs, or dietary supplements you use. Also tell them if you smoke, drink alcohol, or use illegal drugs. Some items may interact with your medicine. What should I watch for while using this medicine? Your condition will be monitored carefully while you are receiving this medicine. You will need important blood work done while you are taking this medicine. This drug may make you feel generally unwell. This is not uncommon, as chemotherapy can affect healthy cells as well as cancer cells. Report any side effects. Continue your course of treatment even though you feel ill unless your doctor tells you to stop. In some cases, you may be given additional medicines to help with side effects. Follow all directions for their use. Call your doctor or health care professional for advice if you get a fever, chills or sore throat, or other symptoms of a cold or flu. Do not treat yourself. This drug decreases your body's ability to fight infections. Try to avoid being around people who are sick. This medicine may increase your risk to bruise or bleed. Call your doctor or health care professional if you notice any unusual bleeding. Be careful brushing and flossing your teeth or using a toothpick because you may get an infection or bleed more easily. If you have any dental work done, tell  your dentist you are receiving this medicine. Avoid taking products that contain aspirin, acetaminophen, ibuprofen, naproxen, or ketoprofen unless instructed by your doctor. These medicines may hide a fever. Do not become pregnant while taking this medicine. Women should inform their doctor if they wish to become pregnant or think they might be pregnant. There is a  potential for serious side effects to an unborn child. Talk to your health care professional or pharmacist for more information. Do not breast-feed an infant while taking this medicine. What side effects may I notice from receiving this medicine? Side effects that you should report to your doctor or health care professional as soon as possible: -allergic reactions like skin rash, itching or hives, swelling of the face, lips, or tongue -signs of infection - fever or chills, cough, sore throat, pain or difficulty passing urine -signs of decreased platelets or bleeding - bruising, pinpoint red spots on the skin, black, tarry stools, nosebleeds -signs of decreased red blood cells - unusually weak or tired, fainting spells, lightheadedness -breathing problems -changes in hearing -changes in vision -chest pain -high blood pressure -low blood counts - This drug may decrease the number of white blood cells, red blood cells and platelets. You may be at increased risk for infections and bleeding. -nausea and vomiting -pain, swelling, redness or irritation at the injection site -pain, tingling, numbness in the hands or feet -problems with balance, talking, walking -trouble passing urine or change in the amount of urine Side effects that usually do not require medical attention (report to your doctor or health care professional if they continue or are bothersome): -hair loss -loss of appetite -metallic taste in the mouth or changes in taste This list may not describe all possible side effects. Call your doctor for medical advice about side effects. You may report side effects to FDA at 1-800-FDA-1088. Where should I keep my medicine? This drug is given in a hospital or clinic and will not be stored at home. NOTE: This sheet is a summary. It may not cover all possible information. If you have questions about this medicine, talk to your doctor, pharmacist, or health care provider.  2018 Elsevier/Gold  Standard (2007-09-12 14:38:05)

## 2017-01-24 NOTE — Progress Notes (Signed)
Educated patient on port placement procedure and maintenance. Pt interested in getting port. Difficulty with IV's and getting taxol/carbo. Discussed with RN Stanton Kidney with Dr. Julien Nordmann. Pt available any day after 11am to be scheduled for the port.   Diastolic BP of 185 was not verified with manual cuff. Pt asymptomatic upon d/c.

## 2017-01-24 NOTE — Telephone Encounter (Signed)
Gave patient avs and calendar for Aug and Sept.

## 2017-01-24 NOTE — Telephone Encounter (Signed)
I completed the Prior Auth without knowing she follows with Pulmonology now. Below are the medications her insurance will cover.   Thanks!

## 2017-01-24 NOTE — Progress Notes (Signed)
Felicia [phone]   [phone]  OFFICE PROGRESS NOTE  Herrera, [name], MD [place] [address]  DIAGNOSIS: Stage IIB (T2b, N1, M0) non-small cell lung cancer, adenocarcinoma presented with large left upper lobe lung mass in addition to left hilar lymphadenopathy diagnosed in April 2018. Her previous workup was done at [facility] in [facility] There was insufficient material to perform the molecular studies.  [facility] Molecular studies: BRCA2 N314f. Negative for EGFR, ALK, ROS1, BRAF mutations.  PRIOR THERAPY: None.  CURRENT THERAPY: A course of concurrent chemoradiation with weekly carboplatin for AUC of 2 and paclitaxel 45 MG/M2. First dose 01/24/2017.  INTERVAL HISTORY: [name]89y.o. female returns to the clinic today for follow-up visit. The patient is feeling fine today with no specific complaints. She is expected to start the first dose of her concurrent chemoradiation today. She denied having any chest pain, shortness breath, cough or hemoptysis. She denied having any fever or chills. She has no nausea, vomiting, diarrhea or constipation. She had molecular studies performed recently by [facility] and it showed  BRCA2 N3426f The patient had several family members with malignancy including a sister with pancreatic cancer, half brother with brain cancer another half brother with colon cancer and grandfather with colon cancer. She is here today for evaluation before starting the first dose of her treatment.  MEDICAL HISTORY: Past Medical History:  Diagnosis Date  . Adenocarcinoma of left lung, stage 2 ([facility]/02/2017  . [facility]   Lun cancer: Right Dx 2008, s/p lobectomy. Left Dx 09/2016  . COPD (chronic obstructive pulmonary disease) ([facility]  . Encounter for antineoplastic chemotherapy 12/27/2016  . Hyperlipidemia   . Hypertension   . [facility]    ALLERGIES:  has  No Known Allergies.  MEDICATIONS:  Current Outpatient Prescriptions  Medication Sig Dispense Refill  . aspirin EC 81 MG tablet Take 81 mg by mouth daily.    . Marland Kitchentorvastatin (LIPITOR) 40 MG tablet Take 40 mg by mouth daily.    . Marland Kitchenextromethorphan HBr (COUGH SUPPRESSANT PO) Take by mouth.    . Fluticasone-Salmeterol (ADVAIR) 250-50 MCG/DOSE AEPB Inhale 1 puff into the lungs 2 (two) times daily. 60 each 3  . levothyroxine (SYNTHROID, LEVOTHROID) 75 MCG tablet Take 75 mcg by mouth daily before breakfast.    . omeprazole (PRILOSEC) 20 MG capsule Take 20 mg by mouth daily.     No current facility-administered medications for this visit.     SURGICAL HISTORY:  Past Surgical History:  Procedure Laterality Date  . BREAST BIOPSY     several. Denies Hx of breast cancer.  . THORACOTOMY/LOBECTOMY Right 2008  . TONSILLECTOMY  1960    REVIEW OF SYSTEMS:  Constitutional: negative Eyes: negative Ears, nose, mouth, throat, and face: negative Respiratory: negative Cardiovascular: negative Gastrointestinal: negative Genitourinary:negative Integument/breast: negative Hematologic/lymphatic: negative Musculoskeletal:negative Neurological: negative Behavioral/Psych: negative Endocrine: negative Allergic/Immunologic: negative   PHYSICAL EXAMINATION: General appearance: alert, cooperative, fatigued and no distress Head: Normocephalic, without obvious abnormality, atraumatic Neck: no adenopathy, no JVD, supple, symmetrical, trachea midline and thyroid not enlarged, symmetric, no tenderness/mass/nodules Lymph nodes: Cervical, supraclavicular, and axillary nodes normal. Resp: clear to auscultation bilaterally Back: symmetric, no curvature. ROM normal. No CVA tenderness. Cardio: regular rate and rhythm, S1, S2 normal, no murmur, click, rub or gallop GI: soft, non-tender; bowel sounds normal; no masses,  no organomegaly Extremities: extremities normal, atraumatic, no cyanosis or  edema Neurologic:  Alert and oriented X 3, normal strength and tone. Normal symmetric reflexes. Normal coordination and gait  ECOG PERFORMANCE STATUS: 1 - Symptomatic but completely ambulatory  Blood pressure (!) 143/62, pulse 69, temperature 98.2 F (36.8 C), temperature source Oral, resp. rate 18, height 5' 2.5" (1.588 m), weight 149 lb 4.8 oz (67.7 kg), SpO2 98 %.  LABORATORY DATA: Lab Results  Component Value Date   WBC 7.6 01/24/2017   HGB 12.2 01/24/2017   HCT 37.6 01/24/2017   MCV 84.2 01/24/2017   PLT 424 (H) 01/24/2017      Chemistry      Component Value Date/Time   NA 142 01/24/2017 0936   K 4.5 01/24/2017 0936   CL 102 11/04/2016 1255   CO2 28 01/24/2017 0936   BUN 12.6 01/24/2017 0936   CREATININE 0.8 01/24/2017 0936      Component Value Date/Time   CALCIUM 9.7 01/24/2017 0936   ALKPHOS 92 01/24/2017 0936   AST 14 01/24/2017 0936   ALT 12 01/24/2017 0936   BILITOT 0.42 01/24/2017 0936       RADIOGRAPHIC STUDIES: No results found.  ASSESSMENT AND PLAN: This is a very pleasant 61 years old white female recently diagnosed with unresectable a stage IIB non-small cell lung cancer, adenocarcinoma. The patient is here today to start the first cycle of concurrent chemoradiation. I recommended for the patient to proceed with her treatment as scheduled today. For the molecular studies showing  BRCA2 N319f, I will refer the patient to genetic counseling. I will see her back for follow-up visit in 2 weeks for evaluation before starting cycle #3. For hypertension and COPD, the patient will continue her current medications. She was advised to call immediately if she has any concerning symptoms in the interval. The patient voices understanding of current disease status and treatment options and is in agreement with the current care plan. All questions were answered. The patient knows to call the clinic with any problems, questions or concerns. We can certainly see the patient much sooner  if necessary.  I spent 15 minutes counseling the patient face to face. The total time spent in the appointment was 25 minutes.  Disclaimer: This note was dictated with voice recognition software. Similar sounding words can inadvertently be transcribed and may not be corrected upon review.

## 2017-01-25 ENCOUNTER — Ambulatory Visit
Admission: RE | Admit: 2017-01-25 | Discharge: 2017-01-25 | Disposition: A | Discharge status: Home / Self Care | Payer: Medicare Other | Source: Ambulatory Visit | Attending: Radiation Oncology | Admitting: Radiation Oncology

## 2017-01-25 ENCOUNTER — Other Ambulatory Visit: Payer: Self-pay | Admitting: Medical Oncology

## 2017-01-25 DIAGNOSIS — I10 Essential (primary) hypertension: Secondary | ICD-10-CM | POA: Diagnosis not present

## 2017-01-25 DIAGNOSIS — Z8673 Personal history of transient ischemic attack (TIA), and cerebral infarction without residual deficits: Secondary | ICD-10-CM | POA: Diagnosis not present

## 2017-01-25 DIAGNOSIS — E785 Hyperlipidemia, unspecified: Secondary | ICD-10-CM | POA: Diagnosis not present

## 2017-01-25 DIAGNOSIS — C3412 Malignant neoplasm of upper lobe, left bronchus or lung: Secondary | ICD-10-CM | POA: Diagnosis not present

## 2017-01-25 DIAGNOSIS — Z87891 Personal history of nicotine dependence: Secondary | ICD-10-CM | POA: Diagnosis not present

## 2017-01-25 DIAGNOSIS — J449 Chronic obstructive pulmonary disease, unspecified: Secondary | ICD-10-CM | POA: Diagnosis not present

## 2017-01-25 DIAGNOSIS — C3492 Malignant neoplasm of unspecified part of left bronchus or lung: Secondary | ICD-10-CM

## 2017-01-25 DIAGNOSIS — I878 Other specified disorders of veins: Secondary | ICD-10-CM

## 2017-01-25 MED ORDER — SONAFINE EX EMUL
1.0000 "application " | Freq: Once | CUTANEOUS | Status: AC
  Administered 2017-01-25: 1 via TOPICAL

## 2017-01-25 NOTE — Progress Notes (Signed)
Patient was given antifungal power to apply twice a day to the red area on her right upper side.

## 2017-01-25 NOTE — Progress Notes (Signed)
Pt here for patient teaching.  Pt given Radiation and You booklet and Sonafine. Reviewed areas of pertinence such as fatigue, skin changes, throat changes and cough . Pt able to give teach back of to pat skin and use unscented/gentle soap,apply Sonafine bid and avoid applying anything to skin within 4 hours of treatment. Pt demonstrated understanding and verbalizes understanding of information given and will contact nursing with any questions or concerns.          

## 2017-01-26 ENCOUNTER — Ambulatory Visit
Admission: RE | Admit: 2017-01-26 | Discharge: 2017-01-26 | Disposition: A | Discharge status: Home / Self Care | Payer: Medicare Other | Source: Ambulatory Visit | Attending: Radiation Oncology | Admitting: Radiation Oncology

## 2017-01-26 DIAGNOSIS — Z87891 Personal history of nicotine dependence: Secondary | ICD-10-CM | POA: Diagnosis not present

## 2017-01-26 DIAGNOSIS — Z8673 Personal history of transient ischemic attack (TIA), and cerebral infarction without residual deficits: Secondary | ICD-10-CM | POA: Diagnosis not present

## 2017-01-26 DIAGNOSIS — E785 Hyperlipidemia, unspecified: Secondary | ICD-10-CM | POA: Diagnosis not present

## 2017-01-26 DIAGNOSIS — J449 Chronic obstructive pulmonary disease, unspecified: Secondary | ICD-10-CM | POA: Diagnosis not present

## 2017-01-26 DIAGNOSIS — C3492 Malignant neoplasm of unspecified part of left bronchus or lung: Secondary | ICD-10-CM | POA: Diagnosis not present

## 2017-01-26 DIAGNOSIS — C3412 Malignant neoplasm of upper lobe, left bronchus or lung: Secondary | ICD-10-CM | POA: Diagnosis not present

## 2017-01-26 DIAGNOSIS — I10 Essential (primary) hypertension: Secondary | ICD-10-CM | POA: Diagnosis not present

## 2017-01-26 NOTE — Telephone Encounter (Addendum)
MR please advise on alternatives for Advair. Thanks.  Will need to call pt to inform her of the change.

## 2017-01-27 ENCOUNTER — Ambulatory Visit
Admission: RE | Admit: 2017-01-27 | Discharge: 2017-01-27 | Disposition: A | Discharge status: Home / Self Care | Payer: Medicare Other | Source: Ambulatory Visit | Attending: Radiation Oncology | Admitting: Radiation Oncology

## 2017-01-27 DIAGNOSIS — J449 Chronic obstructive pulmonary disease, unspecified: Secondary | ICD-10-CM | POA: Diagnosis not present

## 2017-01-27 DIAGNOSIS — Z8673 Personal history of transient ischemic attack (TIA), and cerebral infarction without residual deficits: Secondary | ICD-10-CM | POA: Diagnosis not present

## 2017-01-27 DIAGNOSIS — C3412 Malignant neoplasm of upper lobe, left bronchus or lung: Secondary | ICD-10-CM | POA: Diagnosis not present

## 2017-01-27 DIAGNOSIS — Z87891 Personal history of nicotine dependence: Secondary | ICD-10-CM | POA: Diagnosis not present

## 2017-01-27 DIAGNOSIS — I10 Essential (primary) hypertension: Secondary | ICD-10-CM | POA: Diagnosis not present

## 2017-01-27 DIAGNOSIS — C3492 Malignant neoplasm of unspecified part of left bronchus or lung: Secondary | ICD-10-CM | POA: Diagnosis not present

## 2017-01-27 DIAGNOSIS — E785 Hyperlipidemia, unspecified: Secondary | ICD-10-CM | POA: Diagnosis not present

## 2017-01-28 ENCOUNTER — Telehealth: Payer: Self-pay | Admitting: Medical Oncology

## 2017-01-28 ENCOUNTER — Ambulatory Visit: Admission: RE | Admit: 2017-01-28 | Payer: Medicare Other | Source: Ambulatory Visit

## 2017-01-28 NOTE — Telephone Encounter (Signed)
I left a message for pt to return call for chemo f/u

## 2017-01-31 ENCOUNTER — Other Ambulatory Visit (HOSPITAL_BASED_OUTPATIENT_CLINIC_OR_DEPARTMENT_OTHER): Payer: Medicare Other

## 2017-01-31 ENCOUNTER — Ambulatory Visit
Admission: RE | Admit: 2017-01-31 | Discharge: 2017-01-31 | Disposition: A | Discharge status: Home / Self Care | Payer: Medicare Other | Source: Ambulatory Visit | Attending: Radiation Oncology | Admitting: Radiation Oncology

## 2017-01-31 ENCOUNTER — Ambulatory Visit (HOSPITAL_BASED_OUTPATIENT_CLINIC_OR_DEPARTMENT_OTHER): Payer: Medicare Other

## 2017-01-31 VITALS — BP 149/73 | HR 78 | Temp 97.7°F | Resp 17

## 2017-01-31 DIAGNOSIS — C3412 Malignant neoplasm of upper lobe, left bronchus or lung: Secondary | ICD-10-CM

## 2017-01-31 DIAGNOSIS — C3492 Malignant neoplasm of unspecified part of left bronchus or lung: Secondary | ICD-10-CM

## 2017-01-31 DIAGNOSIS — Z8673 Personal history of transient ischemic attack (TIA), and cerebral infarction without residual deficits: Secondary | ICD-10-CM | POA: Diagnosis not present

## 2017-01-31 DIAGNOSIS — I10 Essential (primary) hypertension: Secondary | ICD-10-CM | POA: Diagnosis not present

## 2017-01-31 DIAGNOSIS — J449 Chronic obstructive pulmonary disease, unspecified: Secondary | ICD-10-CM | POA: Diagnosis not present

## 2017-01-31 DIAGNOSIS — Z87891 Personal history of nicotine dependence: Secondary | ICD-10-CM | POA: Diagnosis not present

## 2017-01-31 DIAGNOSIS — E785 Hyperlipidemia, unspecified: Secondary | ICD-10-CM | POA: Diagnosis not present

## 2017-01-31 DIAGNOSIS — Z5111 Encounter for antineoplastic chemotherapy: Secondary | ICD-10-CM | POA: Diagnosis present

## 2017-01-31 LAB — COMPREHENSIVE METABOLIC PANEL
ALBUMIN: 3.1 g/dL — AB (ref 3.5–5.0)
ALK PHOS: 81 U/L (ref 40–150)
ALT: 14 U/L (ref 0–55)
ANION GAP: 9 meq/L (ref 3–11)
AST: 13 U/L (ref 5–34)
BILIRUBIN TOTAL: 0.45 mg/dL (ref 0.20–1.20)
BUN: 12.5 mg/dL (ref 7.0–26.0)
CALCIUM: 9.4 mg/dL (ref 8.4–10.4)
CO2: 27 mEq/L (ref 22–29)
Chloride: 104 mEq/L (ref 98–109)
Creatinine: 0.7 mg/dL (ref 0.6–1.1)
EGFR: 88 mL/min/{1.73_m2} — AB (ref >90)
GLUCOSE: 119 mg/dL (ref 70–140)
Potassium: 3.9 mEq/L (ref 3.5–5.1)
SODIUM: 140 meq/L (ref 136–145)
TOTAL PROTEIN: 7.2 g/dL (ref 6.4–8.3)

## 2017-01-31 LAB — CBC WITH DIFFERENTIAL/PLATELET
BASO%: 0.4 % (ref 0.0–2.0)
BASOS ABS: 0 10*3/uL (ref 0.0–0.1)
EOS ABS: 0.1 10*3/uL (ref 0.0–0.5)
EOS%: 0.8 % (ref 0.0–7.0)
HEMATOCRIT: 33.6 % — AB (ref 34.8–46.6)
HEMOGLOBIN: 10.8 g/dL — AB (ref 11.6–15.9)
LYMPH#: 0.7 10*3/uL — AB (ref 0.9–3.3)
LYMPH%: 9.6 % — ABNORMAL LOW (ref 14.0–49.7)
MCH: 27.3 pg (ref 25.1–34.0)
MCHC: 32.1 g/dL (ref 31.5–36.0)
MCV: 84.8 fL (ref 79.5–101.0)
MONO#: 0.5 10*3/uL (ref 0.1–0.9)
MONO%: 6.7 % (ref 0.0–14.0)
NEUT%: 82.5 % — ABNORMAL HIGH (ref 38.4–76.8)
NEUTROS ABS: 6 10*3/uL (ref 1.5–6.5)
PLATELETS: 344 10*3/uL (ref 145–400)
RBC: 3.96 10*6/uL (ref 3.70–5.45)
RDW: 13.2 % (ref 11.2–14.5)
WBC: 7.3 10*3/uL (ref 3.9–10.3)

## 2017-01-31 MED ORDER — FAMOTIDINE IN NACL 20-0.9 MG/50ML-% IV SOLN
20.0000 mg | Freq: Once | INTRAVENOUS | Status: AC
  Administered 2017-01-31: 20 mg via INTRAVENOUS

## 2017-01-31 MED ORDER — PALONOSETRON HCL INJECTION 0.25 MG/5ML
INTRAVENOUS | Status: AC
  Filled 2017-01-31: qty 5

## 2017-01-31 MED ORDER — DIPHENHYDRAMINE HCL 50 MG/ML IJ SOLN
INTRAMUSCULAR | Status: AC
  Filled 2017-01-31: qty 1

## 2017-01-31 MED ORDER — DEXAMETHASONE SODIUM PHOSPHATE 10 MG/ML IJ SOLN
INTRAMUSCULAR | Status: AC
  Filled 2017-01-31: qty 1

## 2017-01-31 MED ORDER — FAMOTIDINE IN NACL 20-0.9 MG/50ML-% IV SOLN
INTRAVENOUS | Status: AC
  Filled 2017-01-31: qty 50

## 2017-01-31 MED ORDER — DIPHENHYDRAMINE HCL 50 MG/ML IJ SOLN
50.0000 mg | Freq: Once | INTRAMUSCULAR | Status: AC
  Administered 2017-01-31: 50 mg via INTRAVENOUS

## 2017-01-31 MED ORDER — SODIUM CHLORIDE 0.9 % IV SOLN
Freq: Once | INTRAVENOUS | Status: AC
  Administered 2017-01-31: 12:00:00 via INTRAVENOUS

## 2017-01-31 MED ORDER — SODIUM CHLORIDE 0.9 % IV SOLN
212.4000 mg | Freq: Once | INTRAVENOUS | Status: AC
  Administered 2017-01-31: 210 mg via INTRAVENOUS
  Filled 2017-01-31: qty 21

## 2017-01-31 MED ORDER — DEXTROSE 5 % IV SOLN
45.0000 mg/m2 | Freq: Once | INTRAVENOUS | Status: AC
  Administered 2017-01-31: 78 mg via INTRAVENOUS
  Filled 2017-01-31: qty 13

## 2017-01-31 MED ORDER — PALONOSETRON HCL INJECTION 0.25 MG/5ML
0.2500 mg | Freq: Once | INTRAVENOUS | Status: AC
  Administered 2017-01-31: 0.25 mg via INTRAVENOUS

## 2017-01-31 MED ORDER — SODIUM CHLORIDE 0.9 % IV SOLN
20.0000 mg | Freq: Once | INTRAVENOUS | Status: AC
  Administered 2017-01-31: 20 mg via INTRAVENOUS
  Filled 2017-01-31: qty 2

## 2017-01-31 NOTE — Patient Instructions (Signed)
[place] [facility] Discharge Instructions for Patients Receiving Chemotherapy  Today you received the following chemotherapy agents Carboplatin, Taxol.   To help prevent nausea and vomiting after your treatment, we encourage you to take your nausea medication as prescribed.    If you develop nausea and vomiting that is not controlled by your nausea medication, call the clinic.   BELOW ARE SYMPTOMS THAT SHOULD BE REPORTED IMMEDIATELY:  *FEVER GREATER THAN 100.5 F  *CHILLS WITH OR WITHOUT FEVER  NAUSEA AND VOMITING THAT IS NOT CONTROLLED WITH YOUR NAUSEA MEDICATION  *UNUSUAL SHORTNESS OF BREATH  *UNUSUAL BRUISING OR BLEEDING  TENDERNESS IN MOUTH AND THROAT WITH OR WITHOUT PRESENCE OF ULCERS  *URINARY PROBLEMS  *BOWEL PROBLEMS  UNUSUAL RASH Items with * indicate a potential emergency and should be followed up as soon as possible.  Feel free to call the clinic you have any questions or concerns. The clinic phone number is [phone].  Please show the [place] at check-in to the Emergency Department and triage nurse.

## 2017-02-01 ENCOUNTER — Ambulatory Visit
Admission: RE | Admit: 2017-02-01 | Discharge: 2017-02-01 | Disposition: A | Discharge status: Home / Self Care | Payer: Medicare Other | Source: Ambulatory Visit | Attending: Radiation Oncology | Admitting: Radiation Oncology

## 2017-02-01 DIAGNOSIS — Z87891 Personal history of nicotine dependence: Secondary | ICD-10-CM | POA: Diagnosis not present

## 2017-02-01 DIAGNOSIS — I10 Essential (primary) hypertension: Secondary | ICD-10-CM | POA: Diagnosis not present

## 2017-02-01 DIAGNOSIS — Z8673 Personal history of transient ischemic attack (TIA), and cerebral infarction without residual deficits: Secondary | ICD-10-CM | POA: Diagnosis not present

## 2017-02-01 DIAGNOSIS — J449 Chronic obstructive pulmonary disease, unspecified: Secondary | ICD-10-CM | POA: Diagnosis not present

## 2017-02-01 DIAGNOSIS — C3412 Malignant neoplasm of upper lobe, left bronchus or lung: Secondary | ICD-10-CM | POA: Diagnosis not present

## 2017-02-01 DIAGNOSIS — C3492 Malignant neoplasm of unspecified part of left bronchus or lung: Secondary | ICD-10-CM | POA: Diagnosis not present

## 2017-02-01 DIAGNOSIS — E785 Hyperlipidemia, unspecified: Secondary | ICD-10-CM | POA: Diagnosis not present

## 2017-02-01 NOTE — Progress Notes (Signed)

## 2017-02-02 ENCOUNTER — Ambulatory Visit
Admission: RE | Admit: 2017-02-02 | Discharge: 2017-02-02 | Disposition: A | Discharge status: Home / Self Care | Payer: Medicare Other | Source: Ambulatory Visit | Attending: Radiation Oncology | Admitting: Radiation Oncology

## 2017-02-02 DIAGNOSIS — Z8673 Personal history of transient ischemic attack (TIA), and cerebral infarction without residual deficits: Secondary | ICD-10-CM | POA: Diagnosis not present

## 2017-02-02 DIAGNOSIS — Z87891 Personal history of nicotine dependence: Secondary | ICD-10-CM | POA: Diagnosis not present

## 2017-02-02 DIAGNOSIS — C3492 Malignant neoplasm of unspecified part of left bronchus or lung: Secondary | ICD-10-CM | POA: Diagnosis not present

## 2017-02-02 DIAGNOSIS — J449 Chronic obstructive pulmonary disease, unspecified: Secondary | ICD-10-CM | POA: Diagnosis not present

## 2017-02-02 DIAGNOSIS — E785 Hyperlipidemia, unspecified: Secondary | ICD-10-CM | POA: Diagnosis not present

## 2017-02-02 DIAGNOSIS — C3412 Malignant neoplasm of upper lobe, left bronchus or lung: Secondary | ICD-10-CM | POA: Diagnosis not present

## 2017-02-02 DIAGNOSIS — I10 Essential (primary) hypertension: Secondary | ICD-10-CM | POA: Diagnosis not present

## 2017-02-03 ENCOUNTER — Ambulatory Visit
Admission: RE | Admit: 2017-02-03 | Discharge: 2017-02-03 | Disposition: A | Discharge status: Home / Self Care | Payer: Medicare Other | Source: Ambulatory Visit | Attending: Radiation Oncology | Admitting: Radiation Oncology

## 2017-02-03 ENCOUNTER — Telehealth: Payer: Self-pay | Admitting: Family Medicine

## 2017-02-03 ENCOUNTER — Other Ambulatory Visit: Payer: Self-pay | Admitting: Radiology

## 2017-02-03 DIAGNOSIS — E785 Hyperlipidemia, unspecified: Secondary | ICD-10-CM | POA: Diagnosis not present

## 2017-02-03 DIAGNOSIS — C3412 Malignant neoplasm of upper lobe, left bronchus or lung: Secondary | ICD-10-CM | POA: Diagnosis not present

## 2017-02-03 DIAGNOSIS — J449 Chronic obstructive pulmonary disease, unspecified: Secondary | ICD-10-CM | POA: Diagnosis not present

## 2017-02-03 DIAGNOSIS — Z87891 Personal history of nicotine dependence: Secondary | ICD-10-CM | POA: Diagnosis not present

## 2017-02-03 DIAGNOSIS — C3492 Malignant neoplasm of unspecified part of left bronchus or lung: Secondary | ICD-10-CM | POA: Diagnosis not present

## 2017-02-03 DIAGNOSIS — Z8673 Personal history of transient ischemic attack (TIA), and cerebral infarction without residual deficits: Secondary | ICD-10-CM | POA: Diagnosis not present

## 2017-02-03 DIAGNOSIS — I10 Essential (primary) hypertension: Secondary | ICD-10-CM | POA: Diagnosis not present

## 2017-02-03 NOTE — Telephone Encounter (Signed)
Pt said her insurance will not cover advair anymore but will cover breo, fluticasone or symbicort. [facility] on [address]

## 2017-02-04 ENCOUNTER — Other Ambulatory Visit: Payer: Self-pay | Admitting: Internal Medicine

## 2017-02-04 ENCOUNTER — Ambulatory Visit (HOSPITAL_COMMUNITY)
Admission: RE | Admit: 2017-02-04 | Discharge: 2017-02-04 | Disposition: A | Discharge status: Home / Self Care | Payer: Medicare Other | Source: Ambulatory Visit | Attending: Internal Medicine | Admitting: Internal Medicine

## 2017-02-04 ENCOUNTER — Ambulatory Visit
Admission: RE | Admit: 2017-02-04 | Discharge: 2017-02-04 | Disposition: A | Discharge status: Home / Self Care | Payer: Medicare Other | Source: Ambulatory Visit | Attending: Radiation Oncology | Admitting: Radiation Oncology

## 2017-02-04 ENCOUNTER — Encounter (HOSPITAL_COMMUNITY): Payer: Self-pay

## 2017-02-04 DIAGNOSIS — J449 Chronic obstructive pulmonary disease, unspecified: Secondary | ICD-10-CM | POA: Diagnosis not present

## 2017-02-04 DIAGNOSIS — I878 Other specified disorders of veins: Secondary | ICD-10-CM

## 2017-02-04 DIAGNOSIS — E785 Hyperlipidemia, unspecified: Secondary | ICD-10-CM | POA: Diagnosis not present

## 2017-02-04 DIAGNOSIS — Z85118 Personal history of other malignant neoplasm of bronchus and lung: Secondary | ICD-10-CM | POA: Diagnosis not present

## 2017-02-04 DIAGNOSIS — Z7982 Long term (current) use of aspirin: Secondary | ICD-10-CM | POA: Insufficient documentation

## 2017-02-04 DIAGNOSIS — C3490 Malignant neoplasm of unspecified part of unspecified bronchus or lung: Secondary | ICD-10-CM | POA: Diagnosis not present

## 2017-02-04 DIAGNOSIS — I10 Essential (primary) hypertension: Secondary | ICD-10-CM | POA: Insufficient documentation

## 2017-02-04 DIAGNOSIS — C3492 Malignant neoplasm of unspecified part of left bronchus or lung: Secondary | ICD-10-CM | POA: Diagnosis not present

## 2017-02-04 DIAGNOSIS — Z902 Acquired absence of lung [part of]: Secondary | ICD-10-CM | POA: Insufficient documentation

## 2017-02-04 DIAGNOSIS — Z8673 Personal history of transient ischemic attack (TIA), and cerebral infarction without residual deficits: Secondary | ICD-10-CM | POA: Diagnosis not present

## 2017-02-04 DIAGNOSIS — C3412 Malignant neoplasm of upper lobe, left bronchus or lung: Secondary | ICD-10-CM | POA: Diagnosis not present

## 2017-02-04 DIAGNOSIS — Z5111 Encounter for antineoplastic chemotherapy: Secondary | ICD-10-CM | POA: Diagnosis not present

## 2017-02-04 DIAGNOSIS — Z79899 Other long term (current) drug therapy: Secondary | ICD-10-CM | POA: Insufficient documentation

## 2017-02-04 DIAGNOSIS — Z87891 Personal history of nicotine dependence: Secondary | ICD-10-CM | POA: Insufficient documentation

## 2017-02-04 HISTORY — PX: IR US GUIDE VASC ACCESS RIGHT: IMG2390

## 2017-02-04 HISTORY — PX: IR FLUORO GUIDE PORT INSERTION RIGHT: IMG5741

## 2017-02-04 LAB — GUARDANT 360

## 2017-02-04 LAB — CBC WITH DIFFERENTIAL/PLATELET
BASOS ABS: 0 10*3/uL (ref 0.0–0.1)
Basophils Relative: 0 %
Eosinophils Absolute: 0.1 10*3/uL (ref 0.0–0.7)
Eosinophils Relative: 1 %
HEMATOCRIT: 31.9 % — AB (ref 36.0–46.0)
Hemoglobin: 10.5 g/dL — ABNORMAL LOW (ref 12.0–15.0)
LYMPHS PCT: 10 %
Lymphs Abs: 0.8 10*3/uL (ref 0.7–4.0)
MCH: 27.4 pg (ref 26.0–34.0)
MCHC: 32.9 g/dL (ref 30.0–36.0)
MCV: 83.3 fL (ref 78.0–100.0)
Monocytes Absolute: 0.3 10*3/uL (ref 0.1–1.0)
Monocytes Relative: 4 %
NEUTROS ABS: 6.2 10*3/uL (ref 1.7–7.7)
Neutrophils Relative %: 85 %
Platelets: 386 10*3/uL (ref 150–400)
RBC: 3.83 MIL/uL — AB (ref 3.87–5.11)
RDW: 13.6 % (ref 11.5–15.5)
WBC: 7.3 10*3/uL (ref 4.0–10.5)

## 2017-02-04 LAB — PROTIME-INR
INR: 0.97
Prothrombin Time: 12.8 seconds (ref 11.4–15.2)

## 2017-02-04 MED ORDER — FENTANYL CITRATE (PF) 100 MCG/2ML IJ SOLN
INTRAMUSCULAR | Status: AC | PRN
  Administered 2017-02-04: 50 ug via INTRAVENOUS

## 2017-02-04 MED ORDER — CEFAZOLIN SODIUM-DEXTROSE 2-4 GM/100ML-% IV SOLN
INTRAVENOUS | Status: AC
  Administered 2017-02-04: 2000 mg via INTRAVENOUS
  Filled 2017-02-04: qty 100

## 2017-02-04 MED ORDER — CEFAZOLIN SODIUM-DEXTROSE 2-4 GM/100ML-% IV SOLN
2.0000 g | INTRAVENOUS | Status: AC
  Administered 2017-02-04: 2000 mg via INTRAVENOUS

## 2017-02-04 MED ORDER — MIDAZOLAM HCL 2 MG/2ML IJ SOLN
INTRAMUSCULAR | Status: AC | PRN
  Administered 2017-02-04: 1 mg via INTRAVENOUS

## 2017-02-04 MED ORDER — LIDOCAINE-EPINEPHRINE (PF) 2 %-1:200000 IJ SOLN
INTRAMUSCULAR | Status: AC
  Filled 2017-02-04: qty 20

## 2017-02-04 MED ORDER — IOPAMIDOL (ISOVUE-300) INJECTION 61%
INTRAVENOUS | Status: AC
  Filled 2017-02-04: qty 50

## 2017-02-04 MED ORDER — MIDAZOLAM HCL 2 MG/2ML IJ SOLN
INTRAMUSCULAR | Status: AC
  Filled 2017-02-04: qty 2

## 2017-02-04 MED ORDER — FENTANYL CITRATE (PF) 100 MCG/2ML IJ SOLN
INTRAMUSCULAR | Status: AC
  Filled 2017-02-04: qty 2

## 2017-02-04 MED ORDER — SODIUM CHLORIDE 0.9 % IV SOLN
INTRAVENOUS | Status: DC
  Administered 2017-02-04: 13:00:00 via INTRAVENOUS

## 2017-02-04 MED ORDER — HEPARIN SOD (PORK) LOCK FLUSH 100 UNIT/ML IV SOLN
INTRAVENOUS | Status: AC
  Filled 2017-02-04: qty 5

## 2017-02-04 MED ORDER — IOPAMIDOL (ISOVUE-300) INJECTION 61%: 10.0000 mL | Freq: Once | INTRAVENOUS | Status: DC | PRN

## 2017-02-04 NOTE — Sedation Documentation (Signed)
Patient denies pain and is resting comfortably.  

## 2017-02-04 NOTE — Discharge Instructions (Signed)
Implanted Port Insertion, Care After °This sheet gives you information about how to care for yourself after your procedure. Your health care provider may also give you more specific instructions. If you have problems or questions, contact your health care provider. °What can I expect after the procedure? °After your procedure, it is common to have: °· Discomfort at the port insertion site. °· Bruising on the skin over the port. This should improve over 3-4 days. ° °Follow these instructions at home: °Port care °· After your port is placed, you will get a manufacturer's information card. The card has information about your port. Keep this card with you at all times. °· Take care of the port as told by your health care provider. Ask your health care provider if you or a family member can get training for taking care of the port at home. A home health care nurse may also take care of the port. °· Make sure to remember what type of port you have. °Incision care °· Follow instructions from your health care provider about how to take care of your port insertion site. Make sure you: °? Wash your hands with soap and water before you change your bandage (dressing). If soap and water are not available, use hand sanitizer. °? Change your dressing as told by your health care provider. °? Leave stitches (sutures), skin glue, or adhesive strips in place. These skin closures may need to stay in place for 2 weeks or longer. If adhesive strip edges start to loosen and curl up, you may trim the loose edges. Do not remove adhesive strips completely unless your health care provider tells you to do that. °· Check your port insertion site every day for signs of infection. Check for: °? More redness, swelling, or pain. °? More fluid or blood. °? Warmth. °? Pus or a bad smell. °General instructions °· Do not take baths, swim, or use a hot tub until your health care provider approves. °· Do not lift anything that is heavier than 10 lb (4.5  kg) for a week, or as told by your health care provider. °· Ask your health care provider when it is okay to: °? Return to work or school. °? Resume usual physical activities or sports. °· Do not drive for 24 hours if you were given a medicine to help you relax (sedative). °· Take over-the-counter and prescription medicines only as told by your health care provider. °· Wear a medical alert bracelet in case of an emergency. This will tell any health care providers that you have a port. °· Keep all follow-up visits as told by your health care provider. This is important. °Contact a health care provider if: °· You cannot flush your port with saline as directed, or you cannot draw blood from the port. °· You have a fever or chills. °· You have more redness, swelling, or pain around your port insertion site. °· You have more fluid or blood coming from your port insertion site. °· Your port insertion site feels warm to the touch. °· You have pus or a bad smell coming from the port insertion site. °Get help right away if: °· You have chest pain or shortness of breath. °· You have bleeding from your port that you cannot control. °Summary °· Take care of the port as told by your health care provider. °· Change your dressing as told by your health care provider. °· Keep all follow-up visits as told by your health care provider. °  This information is not intended to replace advice given to you by your health care provider. Make sure you discuss any questions you have with your health care provider. °Document Released: 03/28/2013 Document Revised: 04/28/2016 Document Reviewed: 04/28/2016 °Elsevier Interactive Patient Education © 2017 [employer] °Moderate Conscious Sedation, Adult, Care After °These instructions provide you with information about caring for yourself after your procedure. Your health care provider may also give you more specific instructions. Your treatment has been planned according to current medical  practices, but problems sometimes occur. Call your health care provider if you have any problems or questions after your procedure. °What can I expect after the procedure? °After your procedure, it is common: °· To feel sleepy for several hours. °· To feel clumsy and have poor balance for several hours. °· To have poor judgment for several hours. °· To vomit if you eat too soon. ° °Follow these instructions at home: °For at least 24 hours after the procedure: ° °· Do not: °? Participate in activities where you could fall or become injured. °? Drive. °? Use heavy machinery. °? Drink alcohol. °? Take sleeping pills or medicines that cause drowsiness. °? Make important decisions or sign legal documents. °? Take care of children on your own. °· Rest. °Eating and drinking °· Follow the diet recommended by your health care provider. °· If you vomit: °? Drink water, juice, or soup when you can drink without vomiting. °? Make sure you have little or no nausea before eating solid foods. °General instructions °· Have a responsible adult stay with you until you are awake and alert. °· Take over-the-counter and prescription medicines only as told by your health care provider. °· If you smoke, do not smoke without supervision. °· Keep all follow-up visits as told by your health care provider. This is important. °Contact a health care provider if: °· You keep feeling nauseous or you keep vomiting. °· You feel light-headed. °· You develop a rash. °· You have a fever. °Get help right away if: °· You have trouble breathing. °This information is not intended to replace advice given to you by your health care provider. Make sure you discuss any questions you have with your health care provider. °Document Released: 03/28/2013 Document Revised: 11/10/2015 Document Reviewed: 09/27/2015 °Elsevier Interactive Patient Education © 2018 [employer] ° °

## 2017-02-04 NOTE — Procedures (Signed)
Lung ca  S/p RT IJ POWERPORT  TIP SVCRA NO COMP STABLE READY FOR USE FULL REPORT IN PACS

## 2017-02-04 NOTE — Telephone Encounter (Signed)
She is following with pulmonologist and has an appt 02/22/17. Because this is the second request, I sent Breo to her pharmacy. If medication helps,she can request refills from pulmonologist's office.  Thanks,'BJ

## 2017-02-04 NOTE — Sedation Documentation (Signed)
Patient is resting comfortably. 

## 2017-02-04 NOTE — Consult Note (Signed)
Chief Complaint: Patient was seen in consultation today for Port-A-Cath placement  Referring Physician(s): Mohamed,Mohamed  Supervising Physician: Daryll Brod  Patient Status: [facility] - Out-pt  History of Present Illness: Felicia Herrera is a 61 y.o. female , former smoker, with remote history of right lung cancer and now with left lung adenocarcinoma diagnosed in April of this year, currently on chemoradiation. She has poor venous access and presents today for Port-A-Cath placement for additional chemotherapy.  Past Medical History:  Diagnosis Date  . Adenocarcinoma of left lung, stage 2 ([name]) 12/27/2016  . Cancer [facility])    Lun cancer: Right Dx 2008, s/p lobectomy. Left Dx 09/2016  . COPD (chronic obstructive pulmonary disease) ([place])   . Encounter for antineoplastic chemotherapy 12/27/2016  . Hyperlipidemia   . Hypertension   . Stroke [facility])     Past Surgical History:  Procedure Laterality Date  . BREAST BIOPSY     several. Denies Hx of breast cancer.  . THORACOTOMY/LOBECTOMY Right 2008  . TONSILLECTOMY  1960    Allergies: Patient has no known allergies.  Medications: Prior to Admission medications   Medication Sig Start Date End Date Taking? Authorizing Provider  aspirin EC 81 MG tablet Take 81 mg by mouth daily.    [provider]  atorvastatin (LIPITOR) 40 MG tablet Take 40 mg by mouth daily.    [provider]  Dextromethorphan HBr (COUGH SUPPRESSANT PO) Take by mouth.    [provider]  Fluticasone-Salmeterol (ADVAIR) 250-50 MCG/DOSE AEPB Inhale 1 puff into the lungs 2 (two) times daily. 01/21/17   [name], [name], MD  levothyroxine (SYNTHROID, LEVOTHROID) 75 MCG tablet Take 75 mcg by mouth daily before breakfast.    [provider]  omeprazole (PRILOSEC) 20 MG capsule Take 20 mg by mouth daily.    [provider]  prochlorperazine (COMPAZINE) 10 MG tablet Take 1 tablet (10 mg total) by mouth every 6 (six) hours as needed  for nausea or vomiting. 01/24/17   Curt Bears, MD  Wound Dressings [facility]) Apply 1 application topically 2 (two) times daily.    [provider]     Family History  Problem Relation Age of Onset  . Cancer Mother        lung  . Arthritis Mother   . Hypertension Father   . Arthritis Father   . Cancer Sister        pancreas  . Cancer Brother        brain - step 1/2 brother  . Cancer Brother        colon - step 1/2 brother  . Hypertension Brother   . Mental retardation Brother   . Depression Neg Hx   . Heart disease Neg Hx     Social History   Social History  . Marital status: Single    Spouse name: N/A  . Number of children: N/A  . Years of education: N/A   Occupational History  . landscaping    Social History Main Topics  . Smoking status: Former Smoker    Packs/day: 1.00    Years: 30.00    Types: Cigarettes    Quit date: 06/21/2005  . Smokeless tobacco: Never Used  . Alcohol use No  . Drug use: No  . Sexual activity: Not Currently   Other Topics Concern  . Not on file   Social History Narrative   Landscaper.    Lives alone.       Review of Systems currently  denies fever, headache, chest pain, dyspnea, cough, significant abdominal pain back pain, nausea, vomiting or abnormal bleeding. She does have some constipation.  Vital Signs: BP (!) 147/83 (BP Location: Right Arm)   Pulse 83   Temp 98.3 F (36.8 C) (Oral)   Resp 16   SpO2 93%   Physical Exam awake, alert. Chest with distant breath sounds bilaterally. Heart with regular rate and rhythm. Abdomen soft, positive bowel sounds, not significantly tender. No lower extremity edema.  Mallampati Score:     Imaging: No results found.  Labs:  CBC:  Recent Labs  12/27/16 1358 01/24/17 0936 01/31/17 0958  WBC 8.3 7.6 7.3  HGB 11.8 12.2 10.8*  HCT 36.8 37.6 33.6*  PLT 411* 424* 344    COAGS: No results for input(s): INR, APTT in the last 8760 hours.  BMP:  Recent Labs   11/04/16 1255 12/27/16 1358 01/24/17 0936 01/31/17 0959  NA 140 143 142 140  K 4.9 3.7 4.5 3.9  CL 102  --   --   --   CO2 30 26 28 27   GLUCOSE 84 97 99 119  BUN 24* 16.1 12.6 12.5  CALCIUM 9.5 9.5 9.7 9.4  CREATININE 0.78 0.8 0.8 0.7    LIVER FUNCTION TESTS:  Recent Labs  12/27/16 1358 01/24/17 0936 01/31/17 0959  BILITOT 0.44 0.42 0.45  AST 16 14 13   ALT 14 12 14   ALKPHOS 99 92 81  PROT 7.4 7.2 7.2  ALBUMIN 3.7 3.7 3.1*    TUMOR MARKERS: No results for input(s): AFPTM, CEA, CA199, CHROMGRNA in the last 8760 hours.  Assessment and Plan: 61 y.o. female , former smoker, with remote history of right lung cancer and now with left lung adenocarcinoma diagnosed in April of this year, currently on chemoradiation. She has poor venous access and presents today for Port-A-Cath placement for additional chemotherapy.Risks and benefits discussed with the patient including, but not limited to bleeding, infection, pneumothorax, or fibrin sheath development and need for additional procedures.All of the patient's questions were answered, patient is agreeable to proceed. Consent signed and in chart. Labs pending.       Thank you for this interesting consult.  I greatly enjoyed meeting Felicia Herrera and look forward to participating in their care.  A copy of this report was sent to the requesting provider on this date.  Electronically Signed: D. Rowe Robert, PA-C 02/04/2017, 12:51 PM   I spent a total of 25 minutes    in face to face in clinical consultation, greater than 50% of which was counseling/coordinating care for Port-A-Cath placement

## 2017-02-07 ENCOUNTER — Ambulatory Visit (HOSPITAL_BASED_OUTPATIENT_CLINIC_OR_DEPARTMENT_OTHER): Payer: Medicare Other

## 2017-02-07 ENCOUNTER — Other Ambulatory Visit (HOSPITAL_BASED_OUTPATIENT_CLINIC_OR_DEPARTMENT_OTHER): Payer: Medicare Other

## 2017-02-07 ENCOUNTER — Telehealth: Payer: Self-pay | Admitting: Internal Medicine

## 2017-02-07 ENCOUNTER — Ambulatory Visit
Admission: RE | Admit: 2017-02-07 | Discharge: 2017-02-07 | Disposition: A | Discharge status: Home / Self Care | Payer: Medicare Other | Source: Ambulatory Visit | Attending: Radiation Oncology | Admitting: Radiation Oncology

## 2017-02-07 ENCOUNTER — Ambulatory Visit (HOSPITAL_BASED_OUTPATIENT_CLINIC_OR_DEPARTMENT_OTHER): Payer: Medicare Other | Admitting: Internal Medicine

## 2017-02-07 ENCOUNTER — Other Ambulatory Visit: Payer: Self-pay | Admitting: Family Medicine

## 2017-02-07 ENCOUNTER — Encounter: Payer: Self-pay | Admitting: Internal Medicine

## 2017-02-07 VITALS — BP 139/69 | HR 84 | Temp 98.6°F | Resp 19 | Ht 62.5 in | Wt 149.4 lb

## 2017-02-07 DIAGNOSIS — Z5111 Encounter for antineoplastic chemotherapy: Secondary | ICD-10-CM

## 2017-02-07 DIAGNOSIS — C3492 Malignant neoplasm of unspecified part of left bronchus or lung: Secondary | ICD-10-CM | POA: Diagnosis not present

## 2017-02-07 DIAGNOSIS — J449 Chronic obstructive pulmonary disease, unspecified: Secondary | ICD-10-CM | POA: Diagnosis not present

## 2017-02-07 DIAGNOSIS — C3412 Malignant neoplasm of upper lobe, left bronchus or lung: Secondary | ICD-10-CM

## 2017-02-07 DIAGNOSIS — Z8673 Personal history of transient ischemic attack (TIA), and cerebral infarction without residual deficits: Secondary | ICD-10-CM | POA: Diagnosis not present

## 2017-02-07 DIAGNOSIS — I10 Essential (primary) hypertension: Secondary | ICD-10-CM

## 2017-02-07 DIAGNOSIS — E785 Hyperlipidemia, unspecified: Secondary | ICD-10-CM | POA: Diagnosis not present

## 2017-02-07 DIAGNOSIS — Z87891 Personal history of nicotine dependence: Secondary | ICD-10-CM | POA: Diagnosis not present

## 2017-02-07 DIAGNOSIS — Z8709 Personal history of other diseases of the respiratory system: Secondary | ICD-10-CM

## 2017-02-07 LAB — COMPREHENSIVE METABOLIC PANEL
ALT: 22 U/L (ref 0–55)
AST: 21 U/L (ref 5–34)
Albumin: 3.2 g/dL — ABNORMAL LOW (ref 3.5–5.0)
Alkaline Phosphatase: 89 U/L (ref 40–150)
Anion Gap: 8 mEq/L (ref 3–11)
BILIRUBIN TOTAL: 0.33 mg/dL (ref 0.20–1.20)
BUN: 11.7 mg/dL (ref 7.0–26.0)
CO2: 27 meq/L (ref 22–29)
Calcium: 9.2 mg/dL (ref 8.4–10.4)
Chloride: 105 mEq/L (ref 98–109)
Creatinine: 0.8 mg/dL (ref 0.6–1.1)
EGFR: 83 mL/min/{1.73_m2} — AB (ref >90)
GLUCOSE: 107 mg/dL (ref 70–140)
Potassium: 3.8 mEq/L (ref 3.5–5.1)
SODIUM: 140 meq/L (ref 136–145)
TOTAL PROTEIN: 6.7 g/dL (ref 6.4–8.3)

## 2017-02-07 LAB — CBC WITH DIFFERENTIAL/PLATELET
BASO%: 0.3 % (ref 0.0–2.0)
Basophils Absolute: 0 10*3/uL (ref 0.0–0.1)
EOS%: 0.9 % (ref 0.0–7.0)
Eosinophils Absolute: 0.1 10*3/uL (ref 0.0–0.5)
HCT: 34 % — ABNORMAL LOW (ref 34.8–46.6)
HGB: 10.8 g/dL — ABNORMAL LOW (ref 11.6–15.9)
LYMPH%: 8.1 % — AB (ref 14.0–49.7)
MCH: 27.1 pg (ref 25.1–34.0)
MCHC: 31.8 g/dL (ref 31.5–36.0)
MCV: 85.4 fL (ref 79.5–101.0)
MONO#: 0.5 10*3/uL (ref 0.1–0.9)
MONO%: 6.8 % (ref 0.0–14.0)
NEUT%: 83.9 % — ABNORMAL HIGH (ref 38.4–76.8)
NEUTROS ABS: 5.8 10*3/uL (ref 1.5–6.5)
Platelets: 304 10*3/uL (ref 145–400)
RBC: 3.98 10*6/uL (ref 3.70–5.45)
RDW: 13.4 % (ref 11.2–14.5)
WBC: 6.9 10*3/uL (ref 3.9–10.3)
lymph#: 0.6 10*3/uL — ABNORMAL LOW (ref 0.9–3.3)

## 2017-02-07 MED ORDER — DIPHENHYDRAMINE HCL 50 MG/ML IJ SOLN
50.0000 mg | Freq: Once | INTRAMUSCULAR | Status: AC
  Administered 2017-02-07: 50 mg via INTRAVENOUS

## 2017-02-07 MED ORDER — PALONOSETRON HCL INJECTION 0.25 MG/5ML
0.2500 mg | Freq: Once | INTRAVENOUS | Status: AC
  Administered 2017-02-07: 0.25 mg via INTRAVENOUS

## 2017-02-07 MED ORDER — SODIUM CHLORIDE 0.9% FLUSH
10.0000 mL | INTRAVENOUS | Status: DC | PRN
  Administered 2017-02-07: 10 mL
  Filled 2017-02-07: qty 10

## 2017-02-07 MED ORDER — SODIUM CHLORIDE 0.9 % IV SOLN
20.0000 mg | Freq: Once | INTRAVENOUS | Status: AC
  Administered 2017-02-07: 20 mg via INTRAVENOUS
  Filled 2017-02-07: qty 2

## 2017-02-07 MED ORDER — PALONOSETRON HCL INJECTION 0.25 MG/5ML
INTRAVENOUS | Status: AC
  Filled 2017-02-07: qty 5

## 2017-02-07 MED ORDER — SODIUM CHLORIDE 0.9 % IV SOLN
212.4000 mg | Freq: Once | INTRAVENOUS | Status: AC
  Administered 2017-02-07: 210 mg via INTRAVENOUS
  Filled 2017-02-07: qty 21

## 2017-02-07 MED ORDER — DIPHENHYDRAMINE HCL 50 MG/ML IJ SOLN
INTRAMUSCULAR | Status: AC
Start: 2017-02-07 — End: 2017-02-07
  Filled 2017-02-07: qty 1

## 2017-02-07 MED ORDER — LIDOCAINE-PRILOCAINE 2.5-2.5 % EX CREA: 1.0000 "application " | TOPICAL_CREAM | CUTANEOUS | 0 refills | Status: DC | PRN

## 2017-02-07 MED ORDER — DEXAMETHASONE SODIUM PHOSPHATE 10 MG/ML IJ SOLN
INTRAMUSCULAR | Status: AC
  Filled 2017-02-07: qty 1

## 2017-02-07 MED ORDER — FLUTICASONE FUROATE-VILANTEROL 200-25 MCG/INH IN AEPB: 1.0000 | INHALATION_SPRAY | Freq: Every day | RESPIRATORY_TRACT | 0 refills | Status: DC

## 2017-02-07 MED ORDER — PACLITAXEL CHEMO INJECTION 300 MG/50ML
45.0000 mg/m2 | Freq: Once | INTRAVENOUS | Status: AC
  Administered 2017-02-07: 78 mg via INTRAVENOUS
  Filled 2017-02-07: qty 13

## 2017-02-07 MED ORDER — SODIUM CHLORIDE 0.9 % IV SOLN
Freq: Once | INTRAVENOUS | Status: AC
  Administered 2017-02-07: 12:00:00 via INTRAVENOUS

## 2017-02-07 MED ORDER — FAMOTIDINE IN NACL 20-0.9 MG/50ML-% IV SOLN
INTRAVENOUS | Status: AC
  Filled 2017-02-07: qty 50

## 2017-02-07 MED ORDER — HEPARIN SOD (PORK) LOCK FLUSH 100 UNIT/ML IV SOLN
500.0000 [IU] | Freq: Once | INTRAVENOUS | Status: AC | PRN
  Administered 2017-02-07: 500 [IU]
  Filled 2017-02-07: qty 5

## 2017-02-07 MED ORDER — FAMOTIDINE IN NACL 20-0.9 MG/50ML-% IV SOLN
20.0000 mg | Freq: Once | INTRAVENOUS | Status: AC
  Administered 2017-02-07: 20 mg via INTRAVENOUS

## 2017-02-07 NOTE — Telephone Encounter (Signed)
Scheduled appt per 8/20 los - Gave patient AVS and calender per los.

## 2017-02-07 NOTE — Patient Instructions (Signed)
[facility] Discharge Instructions for Patients Receiving Chemotherapy  Today you received the following chemotherapy agents :  Taxol,  Carboplatin.  To help prevent nausea and vomiting after your treatment, we encourage you to take your nausea medication as prescribed.   If you develop nausea and vomiting that is not controlled by your nausea medication, call the clinic.   BELOW ARE SYMPTOMS THAT SHOULD BE REPORTED IMMEDIATELY:  *FEVER GREATER THAN 100.5 F  *CHILLS WITH OR WITHOUT FEVER  NAUSEA AND VOMITING THAT IS NOT CONTROLLED WITH YOUR NAUSEA MEDICATION  *UNUSUAL SHORTNESS OF BREATH  *UNUSUAL BRUISING OR BLEEDING  TENDERNESS IN MOUTH AND THROAT WITH OR WITHOUT PRESENCE OF ULCERS  *URINARY PROBLEMS  *BOWEL PROBLEMS  UNUSUAL RASH Items with * indicate a potential emergency and should be followed up as soon as possible.  Feel free to call the clinic you have any questions or concerns. The clinic phone number is [phone].  Please show the CHEMO ALERT CARD at check-in to the Emergency Department and triage nurse.   

## 2017-02-07 NOTE — Telephone Encounter (Signed)
She was on the Advair 250-50.

## 2017-02-07 NOTE — Telephone Encounter (Signed)
Rx for [facility] sent. Thanks, BJ

## 2017-02-07 NOTE — Progress Notes (Signed)
[place] [phone]   Fax:([phone]  OFFICE PROGRESS NOTE  [name], [name], MD [place] [address]  DIAGNOSIS: Stage IIB (T2b, N1, M0) non-small cell lung cancer, adenocarcinoma presented with large left upper lobe lung mass in addition to left hilar lymphadenopathy diagnosed in April 2018. Her previous workup was done at [facility] in [facility]. There was insufficient material to perform the molecular studies.  GU[facility]ANT 360 Molecular studies: BRCA2 N358f. Negative for EGFR, ALK, ROS1, BRAF mutations.  PRIOR THERAPY: None.  CURRENT THERAPY: A course of concurrent chemoradiation with weekly carboplatin for AUC of 2 and paclitaxel 45 MG/M2. First dose 01/24/2017. Status post 2 cycles.  INTERVAL HISTORY: CCloris Flippo61y.o. female returns to the clinic today for follow-up visit. The patient tolerated the first 2 weeks of her treatment fairly well. She denied having any fever or chills. She has no nausea, vomiting, diarrhea but has constipation. She denied having any weight loss or night sweats. She has no chest pain, shortness of breath, cough or hemoptysis. She had a Port-A-Cath placed recently. She is here today for evaluation before starting cycle #3.  MEDICAL HISTORY: Past Medical History:  Diagnosis Date  . [place]  . Cancer ([facility]    Lun cancer: Right Dx 2008, s/p lobectomy. Left Dx 09/2016  . [place] obstructive pulmonary disease) [place]   . Encounter for antineoplastic chemotherapy 12/27/2016  . Hyperlipidemia   . Hypertension   . Stroke ([facility]     ALLERGIES:  has No Known Allergies.  MEDICATIONS:  Current Outpatient Prescriptions  Medication Sig Dispense Refill  . aspirin EC 81 MG tablet Take 81 mg by mouth daily.    .Marland Kitchenatorvastatin (LIPITOR) 40 MG tablet Take 40 mg by mouth daily.    .Marland KitchenDextromethorphan HBr (COUGH SUPPRESSANT PO)  Take by mouth.    . fluticasone furoate-vilanterol (BREO ELLIPTA) 200-25 MCG/INH AEPB Inhale 1 puff into the lungs daily. 60 each 0  . levothyroxine (SYNTHROID, LEVOTHROID) 75 MCG tablet Take 75 mcg by mouth daily before breakfast.    . omeprazole (PRILOSEC) 20 MG capsule Take 20 mg by mouth daily.    . prochlorperazine (COMPAZINE) 10 MG tablet Take 1 tablet (10 mg total) by mouth every 6 (six) hours as needed for nausea or vomiting. 30 tablet 0  . Wound Dressings (SONAFINE) Apply 1 application topically 2 (two) times daily.     No current facility-administered medications for this visit.     SURGICAL HISTORY:  Past Surgical History:  Procedure Laterality Date  . BREAST BIOPSY     several. Denies Hx of breast cancer.  . IR FLUORO GUIDE PORT INSERTION RIGHT  02/04/2017  . IR [place] VASC ACCESS RIGHT  02/04/2017  . THORACOTOMY/LOBECTOMY Right 2008  . TONSILLECTOMY  1960    REVIEW OF SYSTEMS:  A comprehensive review of systems was negative except for: Gastrointestinal: positive for constipation   PHYSICAL EXAMINATION: General appearance: alert, cooperative and no distress Head: Normocephalic, without obvious abnormality, atraumatic Neck: no adenopathy, no JVD, supple, symmetrical, trachea midline and thyroid not enlarged, symmetric, no tenderness/mass/nodules Lymph nodes: Cervical, supraclavicular, and axillary nodes normal. Resp: clear to auscultation bilaterally Back: symmetric, no curvature. ROM normal. No CVA tenderness. Cardio: regular rate and rhythm, S1, S2 normal, no murmur, click, rub or gallop GI: soft, non-tender; bowel sounds normal; no masses,  no organomegaly Extremities: extremities normal, atraumatic,  no cyanosis or edema  ECOG PERFORMANCE STATUS: 1 - Symptomatic but completely ambulatory  Blood pressure 139/69, pulse 84, temperature 98.6 F (37 C), temperature source Oral, resp. rate 19, height 5' 2.5" (1.588 m), weight 149 lb 6.4 oz (67.8 kg), SpO2 99  %.  LABORATORY DATA: Lab Results  Component Value Date   WBC 6.9 02/07/2017   HGB 10.8 (L) 02/07/2017   HCT 34.0 (L) 02/07/2017   MCV 85.4 02/07/2017   PLT 304 02/07/2017      Chemistry      Component Value Date/Time   NA 140 02/07/2017 0958   K 3.8 02/07/2017 0958   CL 102 11/04/2016 1255   CO2 27 02/07/2017 0958   BUN 11.7 02/07/2017 0958   CREATININE 0.8 02/07/2017 0958      Component Value Date/Time   CALCIUM 9.2 02/07/2017 0958   ALKPHOS 89 02/07/2017 0958   AST 21 02/07/2017 0958   ALT 22 02/07/2017 0958   BILITOT 0.33 02/07/2017 0958       RADIOGRAPHIC STUDIES: Ir US Guide Vasc Access Right  Result Date: 02/04/2017 CLINICAL DATA:  Lung cancer, access for chemotherapy EXAM: RIGHT INTERNAL JUGULAR SINGLE LUMEN POWER PORT CATHETER INSERTION Date:  8/17/20188/17/2018 3:41 pm; 02/04/2017 3:03 pm Radiologist:  M. Ruel Favors, MD Guidance:  Ultrasound and fluoroscopic MEDICATIONS: Ancef 2 g; The antibiotic was administered within an appropriate time interval prior to skin puncture. ANESTHESIA/SEDATION: Versed 1.0 mg IV; Fentanyl 50 mcg IV; Moderate Sedation Time:  30 minutes The patient was continuously monitored during the procedure by the interventional radiology nurse under my direct supervision. FLUOROSCOPY TIME:  1 minutes, 48 seconds (10.5 mGy) COMPLICATIONS: None immediate. CONTRAST:  5 cc PROCEDURE: Informed consent was obtained from the patient following explanation of the procedure, risks, benefits and alternatives. The patient understands, agrees and consents for the procedure. All questions were addressed. A time out was performed. Maximal barrier sterile technique utilized including caps, mask, sterile gowns, sterile gloves, large sterile drape, hand hygiene, and 2% chlorhexidine scrub. Under sterile conditions and local anesthesia, right internal jugular micropuncture venous access was performed. Access was performed with ultrasound. Images were obtained for  documentation. Through the micro dilator, contrast injection performed for SVC venogram because of the patient's distorted anatomy following chronic right pneumonectomy. This allowed identification of the SVC RA junction for adequate port catheter insertion. A guide wire was inserted followed by a transitional dilator. This allowed insertion of a guide wire and catheter into the IVC. Measurements were obtained from the SVC / RA junction back to the right IJ venotomy site. In the right infraclavicular chest, a subcutaneous pocket was created over the second anterior rib. This was done under sterile conditions and local anesthesia. 1% lidocaine with epinephrine was utilized for this. A 2.5 cm incision was made in the skin. Blunt dissection was performed to create a subcutaneous pocket over the right pectoralis major muscle. The pocket was flushed with saline vigorously. There was adequate hemostasis. The port catheter was assembled and checked for leakage. The port catheter was secured in the pocket with two retention sutures. The tubing was tunneled subcutaneously to the right venotomy site and inserted into the SVC/RA junction through a valved peel-away sheath. Position was confirmed with fluoroscopy. Images were obtained for documentation. The patient tolerated the procedure well. No immediate complications. Incisions were closed in a two layer fashion with 4 - 0 Vicryl suture. Dermabond was applied to the skin. The port catheter was accessed, blood was aspirated followed  by saline and heparin flushes. Needle was removed. A dry sterile dressing was applied. IMPRESSION: Ultrasound and fluoroscopically guided right internal jugular single lumen power port catheter insertion. Tip in the SVC/RA junction. Catheter ready for use. Electronically Signed   By: Jerilynn Mages.  Shick M.D.   On: 02/04/2017 16:26   Ir Fluoro Guide Port Insertion Right  Result Date: 02/04/2017 CLINICAL DATA:  Lung cancer, access for chemotherapy EXAM:  RIGHT INTERNAL JUGULAR SINGLE LUMEN POWER PORT CATHETER INSERTION Date:  8/17/20188/17/2018 3:41 pm; 02/04/2017 3:03 pm Radiologist:  M. Daryll Brod, MD Guidance:  Ultrasound and fluoroscopic MEDICATIONS: Ancef 2 g; The antibiotic was administered within an appropriate time interval prior to skin puncture. ANESTHESIA/SEDATION: Versed 1.0 mg IV; Fentanyl 50 mcg IV; Moderate Sedation Time:  30 minutes The patient was continuously monitored during the procedure by the interventional radiology nurse under my direct supervision. FLUOROSCOPY TIME:  1 minutes, 48 seconds (85.6 mGy) COMPLICATIONS: None immediate. CONTRAST:  5 cc PROCEDURE: Informed consent was obtained from the patient following explanation of the procedure, risks, benefits and alternatives. The patient understands, agrees and consents for the procedure. All questions were addressed. A time out was performed. Maximal barrier sterile technique utilized including caps, mask, sterile gowns, sterile gloves, large sterile drape, hand hygiene, and 2% chlorhexidine scrub. Under sterile conditions and local anesthesia, right internal jugular micropuncture venous access was performed. Access was performed with ultrasound. Images were obtained for documentation. Through the micro dilator, contrast injection performed for SVC venogram because of the patient's distorted anatomy following chronic right pneumonectomy. This allowed identification of the SVC RA junction for adequate port catheter insertion. A guide wire was inserted followed by a transitional dilator. This allowed insertion of a guide wire and catheter into the IVC. Measurements were obtained from the SVC / RA junction back to the right IJ venotomy site. In the right infraclavicular chest, a subcutaneous pocket was created over the second anterior rib. This was done under sterile conditions and local anesthesia. 1% lidocaine with epinephrine was utilized for this. A 2.5 cm incision was made in the skin.  Blunt dissection was performed to create a subcutaneous pocket over the right pectoralis major muscle. The pocket was flushed with saline vigorously. There was adequate hemostasis. The port catheter was assembled and checked for leakage. The port catheter was secured in the pocket with two retention sutures. The tubing was tunneled subcutaneously to the right venotomy site and inserted into the SVC/RA junction through a valved peel-away sheath. Position was confirmed with fluoroscopy. Images were obtained for documentation. The patient tolerated the procedure well. No immediate complications. Incisions were closed in a two layer fashion with 4 - 0 Vicryl suture. Dermabond was applied to the skin. The port catheter was accessed, blood was aspirated followed by saline and heparin flushes. Needle was removed. A dry sterile dressing was applied. IMPRESSION: Ultrasound and fluoroscopically guided right internal jugular single lumen power port catheter insertion. Tip in the SVC/RA junction. Catheter ready for use. Electronically Signed   By: Jerilynn Mages.  Shick M.D.   On: 02/04/2017 16:26    ASSESSMENT AND PLAN: This is a very pleasant 61 years old white female recently diagnosed with unresectable a stage IIB non-small cell lung cancer, adenocarcinoma. The patient is currently undergoing a course of concurrent chemoradiation with weekly carboplatin and paclitaxel is status post 2 cycles and has been tolerating this treatment fairly well with no significant adverse effects. I recommended for her to proceed with cycle #3 today as scheduled. I  would see the patient back for follow-up visit in 2 weeks for evaluation before starting cycle #5. She was advised to call immediately if she has any concerning symptoms in the interval. The patient voices understanding of current disease status and treatment options and is in agreement with the current care plan. All questions were answered. The patient knows to call the clinic with  any problems, questions or concerns. We can certainly see the patient much sooner if necessary. I spent 10 minutes counseling the patient face to face. The total time spent in the appointment was 15 minutes.  Disclaimer: This note was dictated with voice recognition software. Similar sounding words can inadvertently be transcribed and may not be corrected upon review.

## 2017-02-08 ENCOUNTER — Ambulatory Visit (HOSPITAL_COMMUNITY)
Admission: RE | Admit: 2017-02-08 | Discharge: 2017-02-08 | Disposition: A | Discharge status: Home / Self Care | Payer: Medicare Other | Source: Ambulatory Visit | Attending: Radiation Oncology | Admitting: Radiation Oncology

## 2017-02-08 ENCOUNTER — Other Ambulatory Visit: Payer: Self-pay | Admitting: Oncology

## 2017-02-08 ENCOUNTER — Telehealth: Payer: Self-pay | Admitting: Oncology

## 2017-02-08 ENCOUNTER — Ambulatory Visit
Admission: RE | Admit: 2017-02-08 | Discharge: 2017-02-08 | Disposition: A | Discharge status: Home / Self Care | Payer: Medicare Other | Source: Ambulatory Visit | Attending: Radiation Oncology | Admitting: Radiation Oncology

## 2017-02-08 ENCOUNTER — Encounter: Payer: Self-pay | Admitting: Genetics

## 2017-02-08 ENCOUNTER — Ambulatory Visit (HOSPITAL_BASED_OUTPATIENT_CLINIC_OR_DEPARTMENT_OTHER): Payer: Medicare Other | Admitting: Genetics

## 2017-02-08 ENCOUNTER — Other Ambulatory Visit: Payer: Medicare Other

## 2017-02-08 ENCOUNTER — Telehealth: Payer: Self-pay | Admitting: *Deleted

## 2017-02-08 DIAGNOSIS — C3492 Malignant neoplasm of unspecified part of left bronchus or lung: Secondary | ICD-10-CM | POA: Diagnosis not present

## 2017-02-08 DIAGNOSIS — Z800 Family history of malignant neoplasm of digestive organs: Secondary | ICD-10-CM | POA: Diagnosis not present

## 2017-02-08 DIAGNOSIS — C3490 Malignant neoplasm of unspecified part of unspecified bronchus or lung: Secondary | ICD-10-CM

## 2017-02-08 DIAGNOSIS — J449 Chronic obstructive pulmonary disease, unspecified: Secondary | ICD-10-CM | POA: Diagnosis not present

## 2017-02-08 DIAGNOSIS — Z809 Family history of malignant neoplasm, unspecified: Secondary | ICD-10-CM | POA: Diagnosis not present

## 2017-02-08 DIAGNOSIS — Z8673 Personal history of transient ischemic attack (TIA), and cerebral infarction without residual deficits: Secondary | ICD-10-CM | POA: Diagnosis not present

## 2017-02-08 DIAGNOSIS — E785 Hyperlipidemia, unspecified: Secondary | ICD-10-CM | POA: Diagnosis not present

## 2017-02-08 DIAGNOSIS — I10 Essential (primary) hypertension: Secondary | ICD-10-CM | POA: Diagnosis not present

## 2017-02-08 DIAGNOSIS — Z808 Family history of malignant neoplasm of other organs or systems: Secondary | ICD-10-CM | POA: Diagnosis not present

## 2017-02-08 DIAGNOSIS — C3412 Malignant neoplasm of upper lobe, left bronchus or lung: Secondary | ICD-10-CM | POA: Diagnosis not present

## 2017-02-08 DIAGNOSIS — R600 Localized edema: Secondary | ICD-10-CM | POA: Insufficient documentation

## 2017-02-08 DIAGNOSIS — Z7183 Encounter for nonprocreative genetic counseling: Secondary | ICD-10-CM | POA: Diagnosis not present

## 2017-02-08 DIAGNOSIS — Z87891 Personal history of nicotine dependence: Secondary | ICD-10-CM | POA: Diagnosis not present

## 2017-02-08 NOTE — Progress Notes (Signed)
VASCULAR LAB PRELIMINARY  PRELIMINARY  PRELIMINARY  PRELIMINARY  Left lower extremity venous duplex completed.    Preliminary report:  Left:  No evidence of DVT, superficial thrombosis, or Baker's cyst.  Marilyn Nihiser, RVS 02/08/2017, 2:19 PM

## 2017-02-08 NOTE — Telephone Encounter (Signed)
Left a message for patient advising her that the doppler from today was normal.

## 2017-02-08 NOTE — Progress Notes (Signed)
REFERRING PROVIDER: Curt Bears, MD [address] [place], [place] [address]  PRIMARY PROVIDER:  [name], [name], MD  PRIMARY REASON FOR VISIT:  1. Family history of cancer      HISTORY OF PRESENT ILLNESS:   Felicia Herrera, a 61 y.o. female, was seen for a [place] cancer genetics consultation at the request of Dr. Julien Nordmann due to a personal and family history of cancer and a BRCA2 variant identified by Guardant 360 liquid biopsy collected 12/27/2016.  Felicia Herrera presents to clinic today to discuss the possibility of a hereditary predisposition to cancer, genetic testing, and to further clarify her future cancer risks, as well as potential cancer risks for family members.     CANCER HISTORY: In 2008, at the age of 77, Felicia Herrera was diagnosed with cancer of the right lung. This was treated with lobectomy. In April 2018, Felicia Herrera was diagnosed with non-small cell lung cancer, adenocarcinoma which is being treated with chemoradiation. Felicia Herrera underwent tumor molecular studies via [place] liquid biopsy in July 2018. Results revealed a BRCA2 variant called [id number]. Guardant classifies this variant as a variant of uncertain significance that was observed at an allele fraction of 0.4% cfDNA.  Past Medical History:  Diagnosis Date  . Adenocarcinoma of left lung, stage 2 ([place] 12/27[place]  . Cancer ([facility]    Lun cancer: Right Dx 2008, s/p lobectomy. Left Dx 09/2016  . [place] disease) [place] [place]   . Encounter for antineoplastic chemotherapy 12/27/2016  . Hyperlipidemia   . Hypertension   . Stroke ([facility]     Past Surgical History:  Procedure Laterality Date  . BREAST BIOPSY     several. Denies Hx of breast cancer.  . IR FLUORO GUIDE PORT INSERTION RIGHT  02/04/2017  . IR [place]IDE VASC ACCESS RIGHT  02/04/2017  . THORACOTOMY/LOBECTOMY Right 2008  . TONSILLECTOMY  1960    Social History   Social History  . Marital status: Single    Spouse  name: N/A  . Number of children: N/A  . Years of education: N/A   Occupational History  . landscaping    Social History Main Topics  . Smoking status: Former Smoker    Packs/day: 1.00    Years: 30.00    Types: Cigarettes    Quit date: 06/21/2005  . Smokeless tobacco: Never Used  . Alcohol use No  . Drug use: No  . Sexual activity: Not Currently   Other Topics Concern  . Not on file   Social History Narrative   Landscaper.    Lives alone.      FAMILY HISTORY:  We obtained a detailed, 4-generation family history.  Significant diagnoses are listed below: Family History  Problem Relation Age of Onset  . Arthritis Mother   . Lung cancer Mother 434      d.45 from cancer  . Hypertension Father   . Arthritis Father   . Pancreatic cancer Sister 60       d.60 from cancer  . Brain cancer Brother        d.~70  . Colon cancer Brother        d.~70  . Hypertension Brother   . Mental retardation Brother   . Melanoma Brother 455 . Lung cancer Maternal Aunt 66  . Colon cancer Maternal Grandfather   . Depression Neg Hx   . Heart disease Neg Hx    Ms. MWinnehas no children. She has three full brothers, one full  sister, and three paternal half brothers. Her sister, [name], was diagnosed with and died from pancreatic cancer at age 73. A full brother, [name], had melanoma at age 83 and is doing well at age 76. Another full brother, [name], died at 62 without cancers. The third full brother, [name], is 71 without cancers. Felicia Herrera's paternal half brother, Milus Banister, died around the age of 16 after having brain cancer twice. Another paternal half brother, Rolla Plate, died around the age of 4 with a history of colon cancer. The third paternal half brother, Daiva Nakayama, is in his mid-70s without cancer.  Felicia Herrera mother was diagnosed with lung cancer at 73 and died at age 12 from the disease. Felicia Herrera mother had one full sister and one full brother. She also had two paternal half sisters.  The full sister and brother both died in their 20s without known cancers. Both half sisters are living. One has lung cancer at age 23. Felicia Herrera maternal grandfather died when she was 27 and had colon cancer twice. Her maternal grandmother is deceased with no further details.  Felicia Herrera's father died at 74 without cancers. Felicia Herrera knows very little about her paternal family history. Her father had 11 siblings, but there is no further information about them available. Felicia Herrera paternal grandparents died in their late-80s without cancers.  Felicia Herrera is unaware of previous family history of genetic testing for hereditary cancer risks. Patient's maternal ancestors are of Irish/French descent, and paternal ancestors are of English/German descent. There is no reported Ashkenazi Jewish ancestry. There is no known consanguinity.  GENETIC COUNSELING ASSESSMENT: Felicia Herrera is a 61 y.o. female with a personal history of lung cancer and a BRCA2 variant identified through [place] liquid biopsy. Her family history of pancreatic, colon, brain, and melanoma cancers is somewhat suggestive of a hereditary cancer syndrome and predisposition to cancer. We, therefore, discussed and recommended the following at today's visit.   DISCUSSION: We reviewed the characteristics, features and inheritance patterns of hereditary cancer syndromes. We also discussed genetic testing, including the appropriate family members to test, the process of testing, insurance coverage and turn-around-time for results. We discussed the implications of a negative, positive and/or variant of uncertain significant result. We recommended Ms. Dominey pursue genetic testing through [facility]'s Multi-Cancers Panel. [facility] Multi-Cancer Panel includes analysis of the following 83 genes: ALK, APC, ATM, AXIN2, BAP1, BARD1, BLM, BMPR1A, BRCA1, BRCA2, BRIP1, CASR, CDC73, CDH1, CDK4, CDKN1B, CDKN1C, CDKN2A, CEBPA, CHEK2, CTNNA1,  DICER1, DIS3L2, EGFR, EPCAM, FH, FLCN, GATA2, GPC3, GREM1, HOXB13, HRAS, KIT, MAX, MEN1, MET, MITF, MLH1, MSH2, MSH3, MSH6, MUTYH, NBN, NF1, NF2, NTHL1, PALB2, PDGFRA, PHOX2B, PMS2, POLD1, POLE, POT1, PRKAR1A, PTCH1, PTEN, RAD50, RAD51C, RAD51D, RB1, RECQL4, RET, RUNX1, SDHA, SDHAF2, SDHB, SDHC, SDHD, SMAD4, SMARCA4, SMARCB1, SMARCE1, STK11, SUFU, TERC, TERT, TMEM127, TP53, TSC1, TSC2, VHL, WRN, WT1.   Based on Ms. Fleet's personal and family history of cancer, she meets medical criteria for genetic testing. Despite that she meets criteria, she may still have an out of pocket cost. We discussed that if her out of pocket cost for testing is over $100, the laboratory will call and confirm whether she wants to proceed with testing.  If the out of pocket cost of testing is less than $100 she will be billed by the genetic testing laboratory.    PLAN: After considering the risks, benefits, and limitations, Ms. Sheldon  provided informed consent to pursue genetic testing and the blood sample was sent to [facility] for  analysis of the 83-gene Multi-Cancers Panel. Results should be available within approximately 3 weeks' time, at which point they will be disclosed by telephone to Ms. Solivan, as will any additional recommendations warranted by these results. This information will also be available in Epic.   Lastly, we encouraged Ms. Dils to remain in contact with cancer genetics annually so that we can continuously update the family history and inform her of any changes in cancer genetics and testing that may be of benefit for this family.   Ms.  Yearwood questions were answered to her satisfaction today. Our contact information was provided should additional questions or concerns arise. Thank you for the referral and allowing [place] to share in the care of your patient.   Mal Misty, MS, [facility] Certified Naval [email or web address] phone: [phone]  The patient was seen for a  total of 35 minutes in face-to-face genetic counseling.   _______________________________________________________________________ For Office Staff:  Number of people involved in session: 1 Was an Intern/ student involved with case: yes

## 2017-02-08 NOTE — Telephone Encounter (Signed)
Called patient to inform of doppler on 02-08-17 - arrival time - 1:45 pm @ [facility], lvm for a return call

## 2017-02-09 ENCOUNTER — Encounter: Payer: Self-pay | Admitting: *Deleted

## 2017-02-09 ENCOUNTER — Ambulatory Visit
Admission: RE | Admit: 2017-02-09 | Discharge: 2017-02-09 | Disposition: A | Discharge status: Home / Self Care | Payer: Medicare Other | Source: Ambulatory Visit | Attending: Radiation Oncology | Admitting: Radiation Oncology

## 2017-02-09 DIAGNOSIS — Z8673 Personal history of transient ischemic attack (TIA), and cerebral infarction without residual deficits: Secondary | ICD-10-CM | POA: Diagnosis not present

## 2017-02-09 DIAGNOSIS — C3412 Malignant neoplasm of upper lobe, left bronchus or lung: Secondary | ICD-10-CM | POA: Diagnosis not present

## 2017-02-09 DIAGNOSIS — I10 Essential (primary) hypertension: Secondary | ICD-10-CM | POA: Diagnosis not present

## 2017-02-09 DIAGNOSIS — E785 Hyperlipidemia, unspecified: Secondary | ICD-10-CM | POA: Diagnosis not present

## 2017-02-09 DIAGNOSIS — Z87891 Personal history of nicotine dependence: Secondary | ICD-10-CM | POA: Diagnosis not present

## 2017-02-09 DIAGNOSIS — C3492 Malignant neoplasm of unspecified part of left bronchus or lung: Secondary | ICD-10-CM | POA: Diagnosis not present

## 2017-02-09 DIAGNOSIS — J449 Chronic obstructive pulmonary disease, unspecified: Secondary | ICD-10-CM | POA: Diagnosis not present

## 2017-02-10 ENCOUNTER — Ambulatory Visit
Admission: RE | Admit: 2017-02-10 | Discharge: 2017-02-10 | Disposition: A | Discharge status: Home / Self Care | Payer: Medicare Other | Source: Ambulatory Visit | Attending: Radiation Oncology | Admitting: Radiation Oncology

## 2017-02-10 DIAGNOSIS — E785 Hyperlipidemia, unspecified: Secondary | ICD-10-CM | POA: Diagnosis not present

## 2017-02-10 DIAGNOSIS — Z8673 Personal history of transient ischemic attack (TIA), and cerebral infarction without residual deficits: Secondary | ICD-10-CM | POA: Diagnosis not present

## 2017-02-10 DIAGNOSIS — I10 Essential (primary) hypertension: Secondary | ICD-10-CM | POA: Diagnosis not present

## 2017-02-10 DIAGNOSIS — C3412 Malignant neoplasm of upper lobe, left bronchus or lung: Secondary | ICD-10-CM | POA: Diagnosis not present

## 2017-02-10 DIAGNOSIS — C3492 Malignant neoplasm of unspecified part of left bronchus or lung: Secondary | ICD-10-CM | POA: Diagnosis not present

## 2017-02-10 DIAGNOSIS — J449 Chronic obstructive pulmonary disease, unspecified: Secondary | ICD-10-CM | POA: Diagnosis not present

## 2017-02-10 DIAGNOSIS — Z87891 Personal history of nicotine dependence: Secondary | ICD-10-CM | POA: Diagnosis not present

## 2017-02-11 ENCOUNTER — Ambulatory Visit
Admission: RE | Admit: 2017-02-11 | Discharge: 2017-02-11 | Disposition: A | Discharge status: Home / Self Care | Payer: Medicare Other | Source: Ambulatory Visit | Attending: Radiation Oncology | Admitting: Radiation Oncology

## 2017-02-11 DIAGNOSIS — I10 Essential (primary) hypertension: Secondary | ICD-10-CM | POA: Diagnosis not present

## 2017-02-11 DIAGNOSIS — J449 Chronic obstructive pulmonary disease, unspecified: Secondary | ICD-10-CM | POA: Diagnosis not present

## 2017-02-11 DIAGNOSIS — Z8673 Personal history of transient ischemic attack (TIA), and cerebral infarction without residual deficits: Secondary | ICD-10-CM | POA: Diagnosis not present

## 2017-02-11 DIAGNOSIS — Z87891 Personal history of nicotine dependence: Secondary | ICD-10-CM | POA: Diagnosis not present

## 2017-02-11 DIAGNOSIS — E785 Hyperlipidemia, unspecified: Secondary | ICD-10-CM | POA: Diagnosis not present

## 2017-02-11 DIAGNOSIS — C3492 Malignant neoplasm of unspecified part of left bronchus or lung: Secondary | ICD-10-CM | POA: Diagnosis not present

## 2017-02-11 DIAGNOSIS — C3412 Malignant neoplasm of upper lobe, left bronchus or lung: Secondary | ICD-10-CM | POA: Diagnosis not present

## 2017-02-14 ENCOUNTER — Ambulatory Visit (HOSPITAL_BASED_OUTPATIENT_CLINIC_OR_DEPARTMENT_OTHER): Payer: Medicare Other

## 2017-02-14 ENCOUNTER — Other Ambulatory Visit (HOSPITAL_BASED_OUTPATIENT_CLINIC_OR_DEPARTMENT_OTHER): Payer: Medicare Other

## 2017-02-14 ENCOUNTER — Ambulatory Visit
Admission: RE | Admit: 2017-02-14 | Discharge: 2017-02-14 | Disposition: A | Discharge status: Home / Self Care | Payer: Medicare Other | Source: Ambulatory Visit | Attending: Radiation Oncology | Admitting: Radiation Oncology

## 2017-02-14 VITALS — BP 146/72 | HR 89 | Temp 98.8°F | Resp 19

## 2017-02-14 DIAGNOSIS — C3492 Malignant neoplasm of unspecified part of left bronchus or lung: Secondary | ICD-10-CM | POA: Diagnosis not present

## 2017-02-14 DIAGNOSIS — Z5111 Encounter for antineoplastic chemotherapy: Secondary | ICD-10-CM

## 2017-02-14 DIAGNOSIS — J449 Chronic obstructive pulmonary disease, unspecified: Secondary | ICD-10-CM | POA: Diagnosis not present

## 2017-02-14 DIAGNOSIS — Z87891 Personal history of nicotine dependence: Secondary | ICD-10-CM | POA: Diagnosis not present

## 2017-02-14 DIAGNOSIS — C3412 Malignant neoplasm of upper lobe, left bronchus or lung: Secondary | ICD-10-CM

## 2017-02-14 DIAGNOSIS — Z5189 Encounter for other specified aftercare: Secondary | ICD-10-CM

## 2017-02-14 DIAGNOSIS — E785 Hyperlipidemia, unspecified: Secondary | ICD-10-CM | POA: Diagnosis not present

## 2017-02-14 DIAGNOSIS — Z8673 Personal history of transient ischemic attack (TIA), and cerebral infarction without residual deficits: Secondary | ICD-10-CM | POA: Diagnosis not present

## 2017-02-14 DIAGNOSIS — I10 Essential (primary) hypertension: Secondary | ICD-10-CM | POA: Diagnosis not present

## 2017-02-14 LAB — COMPREHENSIVE METABOLIC PANEL
ALT: 15 U/L (ref 0–55)
ANION GAP: 9 meq/L (ref 3–11)
AST: 15 U/L (ref 5–34)
Albumin: 3.2 g/dL — ABNORMAL LOW (ref 3.5–5.0)
Alkaline Phosphatase: 95 U/L (ref 40–150)
BUN: 11.1 mg/dL (ref 7.0–26.0)
CHLORIDE: 106 meq/L (ref 98–109)
CO2: 27 meq/L (ref 22–29)
CREATININE: 0.8 mg/dL (ref 0.6–1.1)
Calcium: 9 mg/dL (ref 8.4–10.4)
EGFR: 85 mL/min/{1.73_m2} — ABNORMAL LOW (ref >90)
Glucose: 94 mg/dl (ref 70–140)
POTASSIUM: 3.8 meq/L (ref 3.5–5.1)
Sodium: 142 mEq/L (ref 136–145)
Total Bilirubin: 0.35 mg/dL (ref 0.20–1.20)
Total Protein: 6.8 g/dL (ref 6.4–8.3)

## 2017-02-14 LAB — CBC WITH DIFFERENTIAL/PLATELET
BASO%: 1.4 % (ref 0.0–2.0)
Basophils Absolute: 0.1 10*3/uL (ref 0.0–0.1)
EOS%: 1.2 % (ref 0.0–7.0)
Eosinophils Absolute: 0.1 10*3/uL (ref 0.0–0.5)
HCT: 33.6 % — ABNORMAL LOW (ref 34.8–46.6)
HGB: 11.3 g/dL — ABNORMAL LOW (ref 11.6–15.9)
LYMPH%: 12.4 % — AB (ref 14.0–49.7)
MCH: 28 pg (ref 25.1–34.0)
MCHC: 33.6 g/dL (ref 31.5–36.0)
MCV: 83.5 fL (ref 79.5–101.0)
MONO#: 0.3 10*3/uL (ref 0.1–0.9)
MONO%: 7.3 % (ref 0.0–14.0)
NEUT#: 3.5 10*3/uL (ref 1.5–6.5)
NEUT%: 77.7 % — AB (ref 38.4–76.8)
Platelets: 394 10*3/uL (ref 145–400)
RBC: 4.02 10*6/uL (ref 3.70–5.45)
RDW: 14 % (ref 11.2–14.5)
WBC: 4.6 10*3/uL (ref 3.9–10.3)
lymph#: 0.6 10*3/uL — ABNORMAL LOW (ref 0.9–3.3)

## 2017-02-14 MED ORDER — SODIUM CHLORIDE 0.9% FLUSH
10.0000 mL | INTRAVENOUS | Status: DC | PRN
Start: 2017-02-14 — End: 2017-02-14
  Administered 2017-02-14: 10 mL
  Filled 2017-02-14: qty 10

## 2017-02-14 MED ORDER — ALTEPLASE 2 MG IJ SOLR
2.0000 mg | Freq: Once | INTRAMUSCULAR | Status: AC | PRN
  Administered 2017-02-14: 2 mg
  Filled 2017-02-14: qty 2

## 2017-02-14 MED ORDER — PALONOSETRON HCL INJECTION 0.25 MG/5ML
0.2500 mg | Freq: Once | INTRAVENOUS | Status: AC
  Administered 2017-02-14: 0.25 mg via INTRAVENOUS

## 2017-02-14 MED ORDER — PACLITAXEL CHEMO INJECTION 300 MG/50ML
45.0000 mg/m2 | Freq: Once | INTRAVENOUS | Status: AC
  Administered 2017-02-14: 78 mg via INTRAVENOUS
  Filled 2017-02-14: qty 13

## 2017-02-14 MED ORDER — SODIUM CHLORIDE 0.9 % IV SOLN
Freq: Once | INTRAVENOUS | Status: AC
  Administered 2017-02-14: 14:00:00 via INTRAVENOUS

## 2017-02-14 MED ORDER — SODIUM CHLORIDE 0.9 % IV SOLN
212.4000 mg | Freq: Once | INTRAVENOUS | Status: AC
  Administered 2017-02-14: 210 mg via INTRAVENOUS
  Filled 2017-02-14: qty 21

## 2017-02-14 MED ORDER — PALONOSETRON HCL INJECTION 0.25 MG/5ML
INTRAVENOUS | Status: AC
  Filled 2017-02-14: qty 5

## 2017-02-14 MED ORDER — DIPHENHYDRAMINE HCL 50 MG/ML IJ SOLN
50.0000 mg | Freq: Once | INTRAMUSCULAR | Status: AC
  Administered 2017-02-14: 50 mg via INTRAVENOUS

## 2017-02-14 MED ORDER — FAMOTIDINE IN NACL 20-0.9 MG/50ML-% IV SOLN
INTRAVENOUS | Status: AC
  Filled 2017-02-14: qty 50

## 2017-02-14 MED ORDER — DIPHENHYDRAMINE HCL 50 MG/ML IJ SOLN
INTRAMUSCULAR | Status: AC
  Filled 2017-02-14: qty 1

## 2017-02-14 MED ORDER — FAMOTIDINE IN NACL 20-0.9 MG/50ML-% IV SOLN
20.0000 mg | Freq: Once | INTRAVENOUS | Status: AC
  Administered 2017-02-14: 20 mg via INTRAVENOUS

## 2017-02-14 MED ORDER — SODIUM CHLORIDE 0.9 % IV SOLN
20.0000 mg | Freq: Once | INTRAVENOUS | Status: AC
  Administered 2017-02-14: 20 mg via INTRAVENOUS
  Filled 2017-02-14: qty 2

## 2017-02-14 MED ORDER — HEPARIN SOD (PORK) LOCK FLUSH 100 UNIT/ML IV SOLN
500.0000 [IU] | Freq: Once | INTRAVENOUS | Status: AC | PRN
Start: 2017-02-14 — End: 2017-02-14
  Administered 2017-02-14: 500 [IU]
  Filled 2017-02-14: qty 5

## 2017-02-14 NOTE — Patient Instructions (Signed)
[facility] Discharge Instructions for Patients Receiving Chemotherapy  Today you received the following chemotherapy agents Taxol and Carboplatin. To help prevent nausea and vomiting after your treatment, we encourage you to take your nausea medication as directed.  If you develop nausea and vomiting that is not controlled by your nausea medication, call the clinic.   BELOW ARE SYMPTOMS THAT SHOULD BE REPORTED IMMEDIATELY:  *FEVER GREATER THAN 100.5 F  *CHILLS WITH OR WITHOUT FEVER  NAUSEA AND VOMITING THAT IS NOT CONTROLLED WITH YOUR NAUSEA MEDICATION  *UNUSUAL SHORTNESS OF BREATH  *UNUSUAL BRUISING OR BLEEDING  TENDERNESS IN MOUTH AND THROAT WITH OR WITHOUT PRESENCE OF ULCERS  *URINARY PROBLEMS  *BOWEL PROBLEMS  UNUSUAL RASH Items with * indicate a potential emergency and should be followed up as soon as possible.  Feel free to call the clinic you have any questions or concerns. The clinic phone number is [phone].  Please show the CHEMO ALERT CARD at check-in to the Emergency Department and triage nurse.    

## 2017-02-15 ENCOUNTER — Ambulatory Visit
Admission: RE | Admit: 2017-02-15 | Discharge: 2017-02-15 | Disposition: A | Discharge status: Home / Self Care | Payer: Medicare Other | Source: Ambulatory Visit | Attending: Radiation Oncology | Admitting: Radiation Oncology

## 2017-02-15 DIAGNOSIS — C3412 Malignant neoplasm of upper lobe, left bronchus or lung: Secondary | ICD-10-CM | POA: Diagnosis not present

## 2017-02-15 DIAGNOSIS — E785 Hyperlipidemia, unspecified: Secondary | ICD-10-CM | POA: Diagnosis not present

## 2017-02-15 DIAGNOSIS — I10 Essential (primary) hypertension: Secondary | ICD-10-CM | POA: Diagnosis not present

## 2017-02-15 DIAGNOSIS — J449 Chronic obstructive pulmonary disease, unspecified: Secondary | ICD-10-CM | POA: Diagnosis not present

## 2017-02-15 DIAGNOSIS — C3492 Malignant neoplasm of unspecified part of left bronchus or lung: Secondary | ICD-10-CM | POA: Diagnosis not present

## 2017-02-15 DIAGNOSIS — Z8673 Personal history of transient ischemic attack (TIA), and cerebral infarction without residual deficits: Secondary | ICD-10-CM | POA: Diagnosis not present

## 2017-02-15 DIAGNOSIS — Z87891 Personal history of nicotine dependence: Secondary | ICD-10-CM | POA: Diagnosis not present

## 2017-02-16 ENCOUNTER — Ambulatory Visit
Admission: RE | Admit: 2017-02-16 | Discharge: 2017-02-16 | Disposition: A | Discharge status: Home / Self Care | Payer: Medicare Other | Source: Ambulatory Visit | Attending: Radiation Oncology | Admitting: Radiation Oncology

## 2017-02-16 DIAGNOSIS — C3492 Malignant neoplasm of unspecified part of left bronchus or lung: Secondary | ICD-10-CM | POA: Diagnosis not present

## 2017-02-16 DIAGNOSIS — I10 Essential (primary) hypertension: Secondary | ICD-10-CM | POA: Diagnosis not present

## 2017-02-16 DIAGNOSIS — C3412 Malignant neoplasm of upper lobe, left bronchus or lung: Secondary | ICD-10-CM | POA: Diagnosis not present

## 2017-02-16 DIAGNOSIS — E785 Hyperlipidemia, unspecified: Secondary | ICD-10-CM | POA: Diagnosis not present

## 2017-02-16 DIAGNOSIS — Z87891 Personal history of nicotine dependence: Secondary | ICD-10-CM | POA: Diagnosis not present

## 2017-02-16 DIAGNOSIS — J449 Chronic obstructive pulmonary disease, unspecified: Secondary | ICD-10-CM | POA: Diagnosis not present

## 2017-02-16 DIAGNOSIS — Z8673 Personal history of transient ischemic attack (TIA), and cerebral infarction without residual deficits: Secondary | ICD-10-CM | POA: Diagnosis not present

## 2017-02-17 ENCOUNTER — Ambulatory Visit
Admission: RE | Admit: 2017-02-17 | Discharge: 2017-02-17 | Disposition: A | Discharge status: Home / Self Care | Payer: Medicare Other | Source: Ambulatory Visit | Attending: Radiation Oncology | Admitting: Radiation Oncology

## 2017-02-17 DIAGNOSIS — Z87891 Personal history of nicotine dependence: Secondary | ICD-10-CM | POA: Diagnosis not present

## 2017-02-17 DIAGNOSIS — I10 Essential (primary) hypertension: Secondary | ICD-10-CM | POA: Diagnosis not present

## 2017-02-17 DIAGNOSIS — C3492 Malignant neoplasm of unspecified part of left bronchus or lung: Secondary | ICD-10-CM | POA: Diagnosis not present

## 2017-02-17 DIAGNOSIS — Z8673 Personal history of transient ischemic attack (TIA), and cerebral infarction without residual deficits: Secondary | ICD-10-CM | POA: Diagnosis not present

## 2017-02-17 DIAGNOSIS — E785 Hyperlipidemia, unspecified: Secondary | ICD-10-CM | POA: Diagnosis not present

## 2017-02-17 DIAGNOSIS — C3412 Malignant neoplasm of upper lobe, left bronchus or lung: Secondary | ICD-10-CM | POA: Diagnosis not present

## 2017-02-17 DIAGNOSIS — J449 Chronic obstructive pulmonary disease, unspecified: Secondary | ICD-10-CM | POA: Diagnosis not present

## 2017-02-17 NOTE — Progress Notes (Signed)
[place] Social Work  Clinical Social Work was referred by patient to review and complete healthcare advance directives.  Clinical Social Worker met with patient  in [place] office.  The patient designated son, Felicia Herrera as their primary healthcare agent and no secondary agent.  Patient also completed healthcare living will.    Clinical Social Worker notarized documents and made copies for patient/family. Clinical Social Worker will send documents to medical records to be scanned into patient's chart. Clinical Social Worker encouraged patient/family to contact with any additional questions or concerns.  Maryjean Morn, MSW, LCSW Clinical Social Worker [facility] [phone]

## 2017-02-18 ENCOUNTER — Ambulatory Visit
Admission: RE | Admit: 2017-02-18 | Discharge: 2017-02-18 | Disposition: A | Discharge status: Home / Self Care | Payer: Medicare Other | Source: Ambulatory Visit | Attending: Radiation Oncology | Admitting: Radiation Oncology

## 2017-02-18 DIAGNOSIS — J449 Chronic obstructive pulmonary disease, unspecified: Secondary | ICD-10-CM | POA: Diagnosis not present

## 2017-02-18 DIAGNOSIS — I10 Essential (primary) hypertension: Secondary | ICD-10-CM | POA: Diagnosis not present

## 2017-02-18 DIAGNOSIS — C3412 Malignant neoplasm of upper lobe, left bronchus or lung: Secondary | ICD-10-CM | POA: Diagnosis not present

## 2017-02-18 DIAGNOSIS — E785 Hyperlipidemia, unspecified: Secondary | ICD-10-CM | POA: Diagnosis not present

## 2017-02-18 DIAGNOSIS — C3492 Malignant neoplasm of unspecified part of left bronchus or lung: Secondary | ICD-10-CM | POA: Diagnosis not present

## 2017-02-18 DIAGNOSIS — Z8673 Personal history of transient ischemic attack (TIA), and cerebral infarction without residual deficits: Secondary | ICD-10-CM | POA: Diagnosis not present

## 2017-02-18 DIAGNOSIS — Z87891 Personal history of nicotine dependence: Secondary | ICD-10-CM | POA: Diagnosis not present

## 2017-02-22 ENCOUNTER — Ambulatory Visit
Admission: RE | Admit: 2017-02-22 | Discharge: 2017-02-22 | Disposition: A | Discharge status: Home / Self Care | Payer: Medicare Other | Source: Ambulatory Visit | Attending: Radiation Oncology | Admitting: Radiation Oncology

## 2017-02-22 ENCOUNTER — Encounter: Payer: Self-pay | Admitting: Internal Medicine

## 2017-02-22 ENCOUNTER — Ambulatory Visit (INDEPENDENT_AMBULATORY_CARE_PROVIDER_SITE_OTHER): Payer: Medicare Other | Admitting: Internal Medicine

## 2017-02-22 VITALS — BP 122/76 | HR 101 | Ht 62.0 in | Wt 147.8 lb

## 2017-02-22 DIAGNOSIS — Z23 Encounter for immunization: Secondary | ICD-10-CM

## 2017-02-22 DIAGNOSIS — C3492 Malignant neoplasm of unspecified part of left bronchus or lung: Secondary | ICD-10-CM | POA: Diagnosis not present

## 2017-02-22 DIAGNOSIS — E785 Hyperlipidemia, unspecified: Secondary | ICD-10-CM | POA: Diagnosis not present

## 2017-02-22 DIAGNOSIS — J449 Chronic obstructive pulmonary disease, unspecified: Secondary | ICD-10-CM

## 2017-02-22 DIAGNOSIS — C3412 Malignant neoplasm of upper lobe, left bronchus or lung: Secondary | ICD-10-CM | POA: Diagnosis not present

## 2017-02-22 DIAGNOSIS — Z87891 Personal history of nicotine dependence: Secondary | ICD-10-CM | POA: Diagnosis not present

## 2017-02-22 DIAGNOSIS — Z8673 Personal history of transient ischemic attack (TIA), and cerebral infarction without residual deficits: Secondary | ICD-10-CM | POA: Diagnosis not present

## 2017-02-22 DIAGNOSIS — I10 Essential (primary) hypertension: Secondary | ICD-10-CM | POA: Diagnosis not present

## 2017-02-22 NOTE — Patient Instructions (Addendum)
ICD-10-CM   1. Stage 2 moderate COPD by GOLD classification ([facility]) J44.9     Stable copd on this visit 02/22/2017  PLAN Ok for breo alone for now - take daily schedule Albuterol as needed Flu shot regular 02/22/2017 Prevnar vaccine 02/22/2017 Please talk to PCP [name], [name], MD -  and ensure you discuss   shingarix vaccine Never get a live vaccine while on chemi Alpha 1 AT check at next office visit  Followup 6 months or sooner if needed

## 2017-02-22 NOTE — Progress Notes (Signed)
Subjective:     Patient ID: Felicia Herrera, female   DOB: 20-Dec-1955, 61 y.o.   MRN: [id number]  HPI    PCP [name], [name], MD   HPI  IOV 12/09/2016  Chief Complaint  Patient presents with  . Pulmonary Consult    Pt referred by Dr. Betty [name] for malignant neoplasm of left lung. Pt denies SOB and CP/tightness.  Pt c/o dry cough.    Felicia Herrera [birth year] [address] [place] [address] - 61 year old female referred by Dr. [name] for evaluation of COPD and lung cancer  At age 52 she moved from [place] where she was born to [employer] where she lived all her life. She used to smoke heavily in the past quit 11 years ago. 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 This happened in the aftermath of right lobectomy/pneumonectomy according to her history for lung cancer. After the lung cancer was in complete remission and she was leading a functional life. Then in 2018 under surveillance she's had a recurrence of lung cancer in the left lung in the area behind her left breast. Apparently this is been biopsy confirmed at [facility] in [facility]. She recollects having had CT scan of the chest and the PET scan back in the spring 2018. Apparently she i' need of chemotherapy and radiation therapy but because she was living alone in [place] she was asked to relocate to [facility] by her brother lives. She currently lives by herself and is awaiting a roommate. She uses a cane to get around. But she is otherwise functional and does not have any dysphagia or cough or dyspnea.  She denies a diagnosis of COPD although he does mention in the chart and she does admit that her Advair helps with seems somewhat contradictory. She has smoked heavily in the past      OV 02/22/2017  Chief Complaint  Patient presents with  . Follow-up    Pt has 2 weeks of chemo left than  done. Pt has daily productive cough with yellow mucus.Pt is requesting flu shot wanting to know if thats okay with chemo.     FU COPD  She has lung cancer and COPD. Recently relocated from [place]. After I saw her she was establishing oncology and is going through carboplatin and Taxol chemotherapy. She states she is stable. She is on Spiriva for her COPD. She had pulmonary function test July 2008 shows Gold stage II COPD. Because of insurance reasons a primary care switching her to [facility] which is going to start. Overall she's stable. Mild dyspnea no active issues. She wants to have flu shot.    Results for Felicia Herrera, Felicia Herrera (MRN [id number]) as of 02/22/2017 10:50  Ref. Range 01/17/2017 09:51  FEV1-Post Latest Units: L 1.25  FEV1-%Pred-Post Latest Units: % 52  FEV1-%Change-Post Latest Units: % 15   Results for Felicia Herrera, Felicia Herrera (MRN [id number]) as of 02/22/2017 10:50  Ref. Range 01/17/2017 09:51  DLCO unc Latest Units: ml/min/mmHg 10.43  DLCO unc % pred Latest Units: % 48    has a past medical history of Adenocarcinoma of left lung, stage 2 ([place]) (12/27/2016); Cancer [facility]); COPD (chronic obstructive pulmonary disease) ([place]); Encounter for antineoplastic chemotherapy (12/27/2016); Hyperlipidemia; Hypertension; and Stroke [facility]).   reports that she quit smoking about 11 years ago. Her smoking use included Cigarettes. She has a 30.00 pack-year smoking history.  She has never used smokeless tobacco.  Past Surgical History:  Procedure Laterality Date  . BREAST BIOPSY     several. Denies Hx of breast cancer.  . IR FLUORO GUIDE PORT INSERTION RIGHT  02/04/2017  . IR US GUIDE VASC ACCESS RIGHT  02/04/2017  . THORACOTOMY/LOBECTOMY Right 2008  . TONSILLECTOMY  1960    No Known Allergies  Immunization History  Administered Date(s) Administered  . Influenza,inj,Quad PF,6+ Mos 03/21/2016    Family History  Problem Relation Age of Onset  . Arthritis Mother   . Lung cancer Mother 40       d.45  from cancer  . Hypertension Father   . Arthritis Father   . Pancreatic cancer Sister 60       d.60 from cancer  . Brain cancer Brother        d.~70  . Colon cancer Brother        d.~70  . Hypertension Brother   . Mental retardation Brother   . Melanoma Brother 26  . Lung cancer Maternal Aunt 66  . Colon cancer Maternal Grandfather   . Depression Neg Hx   . Heart disease Neg Hx      Current Outpatient Prescriptions:  .  aspirin EC 81 MG tablet, Take 81 mg by mouth daily., Disp: , Rfl:  .  atorvastatin (LIPITOR) 40 MG tablet, Take 40 mg by mouth daily., Disp: , Rfl:  .  Dextromethorphan HBr (COUGH SUPPRESSANT PO), Take by mouth., Disp: , Rfl:  .  fluticasone furoate-vilanterol (BREO ELLIPTA) 200-25 MCG/INH AEPB, Inhale 1 puff into the lungs daily., Disp: 60 each, Rfl: 0 .  levothyroxine (SYNTHROID, LEVOTHROID) 75 MCG tablet, Take 75 mcg by mouth daily before breakfast., Disp: , Rfl:  .  lidocaine-prilocaine (EMLA) cream, Apply 1 application topically as needed., Disp: 30 g, Rfl: 0 .  omeprazole (PRILOSEC) 20 MG capsule, Take 20 mg by mouth daily., Disp: , Rfl:  .  prochlorperazine (COMPAZINE) 10 MG tablet, Take 1 tablet (10 mg total) by mouth every 6 (six) hours as needed for nausea or vomiting., Disp: 30 tablet, Rfl: 0 .  Wound Dressings (SONAFINE), Apply 1 application topically 2 (two) times daily., Disp: , Rfl:     Review of Systems     Objective:   Physical Exam  Constitutional: She is oriented to person, place, and time. She appears well-developed and well-nourished. No distress.  HENT:  Head: Normocephalic and atraumatic.  Right Ear: External ear normal.  Left Ear: External ear normal.  Mouth/Throat: Oropharynx is clear and moist. No oropharyngeal exudate.  Eyes: Pupils are equal, round, and reactive to light. Conjunctivae and EOM are normal. Right eye exhibits no discharge. Left eye exhibits no discharge. No scleral icterus.  Neck: Normal range of motion. Neck supple.  No JVD present. No tracheal deviation present. No thyromegaly present.  Cardiovascular: Normal rate, regular rhythm, normal heart sounds and intact distal pulses.  Exam reveals no gallop and no friction rub.   No murmur heard. Pulmonary/Chest: Effort normal and breath sounds normal. No respiratory distress. She has no wheezes. She has no rales. She exhibits no tenderness.  Abdominal: Soft. Bowel sounds are normal. She exhibits no distension and no mass. There is no tenderness. There is no rebound and no guarding.  Musculoskeletal: Normal range of motion. She exhibits no edema or tenderness.  Lymphadenopathy:    She has no cervical adenopathy.  Neurological: She is alert and oriented to person, place, and time. She has normal reflexes.  No cranial nerve deficit. She exhibits normal muscle tone. Coordination normal.  Squeakiness to speech because of prior stroke(  Skin: Skin is warm and dry. No rash noted. She is not diaphoretic. No erythema. No pallor.  Psychiatric: She has a normal mood and affect. Her behavior is normal. Judgment and thought content normal.  Vitals reviewed.  Vitals:   02/22/17 1026  BP: 122/76  Pulse: (!) 101  SpO2: 96%  Weight: 147 lb 12.8 oz (67 kg)  Height: 5\' 2"  (1.575 m)    Estimated body mass index is 27.03 kg/m as calculated from the following:   Height as of this encounter: 5\' 2"  (1.575 m).   Weight as of this encounter: 147 lb 12.8 oz (67 kg).     Assessment:       ICD-10-CM   1. Stage 2 moderate COPD by GOLD classification ([place]) J44.9        Plan:      Stable copd on this visit 02/22/2017  PLAN Ok for breo alone for now - take daily schedule Albuterol as needed Flu shot regular 02/22/2017 Prevnar vaccine 02/22/2017 Please talk to PCP [name], [name], MD -  and ensure you discuss   shingarix vaccine Never get a live vaccine while on chemi Alpha 1 AT check at next office visit  Followup 6 months or sooner if needed   Dr. Brand Males,  M.D., [facility].C.P Pulmonary and Critical Care Medicine Staff Physician [place] [facility] Pager: [phone], If no answer or between  15:00h - 7:00h: call [phone]  02/22/2017 10:58 AM

## 2017-02-22 NOTE — Progress Notes (Deleted)
   Subjective:    Patient ID: Felicia Herrera, female    DOB: [birth year], 61 y.o.   MRN: [id number]  HPI    Review of Systems  Constitutional: Negative for fever and unexpected weight change.  HENT: Negative for congestion, dental problem, ear pain, nosebleeds, postnasal drip, rhinorrhea, sinus pressure, sneezing, sore throat and trouble swallowing.   Eyes: Negative for redness and itching.  Respiratory: Negative for cough, chest tightness, shortness of breath and wheezing.   Cardiovascular: Negative for palpitations and leg swelling.  Gastrointestinal: Negative for nausea and vomiting.  Genitourinary: Negative for dysuria.  Musculoskeletal: Negative for joint swelling.  Skin: Negative for rash.  Neurological: Negative for headaches.  Hematological: Does not bruise/bleed easily.  Psychiatric/Behavioral: Negative for dysphoric mood. The patient is not nervous/anxious.        Objective:   Physical Exam        Assessment & Plan:

## 2017-02-22 NOTE — Addendum Note (Signed)
Addended by: Georjean Mode on: 02/22/2017 12:54 PM   Modules accepted: Orders

## 2017-02-23 ENCOUNTER — Ambulatory Visit (HOSPITAL_BASED_OUTPATIENT_CLINIC_OR_DEPARTMENT_OTHER): Payer: Medicare Other | Admitting: Oncology

## 2017-02-23 ENCOUNTER — Telehealth: Payer: Self-pay

## 2017-02-23 ENCOUNTER — Encounter: Payer: Self-pay | Admitting: Oncology

## 2017-02-23 ENCOUNTER — Ambulatory Visit
Admission: RE | Admit: 2017-02-23 | Discharge: 2017-02-23 | Disposition: A | Discharge status: Home / Self Care | Payer: Medicare Other | Source: Ambulatory Visit | Attending: Radiation Oncology | Admitting: Radiation Oncology

## 2017-02-23 ENCOUNTER — Ambulatory Visit (HOSPITAL_BASED_OUTPATIENT_CLINIC_OR_DEPARTMENT_OTHER): Payer: Medicare Other

## 2017-02-23 ENCOUNTER — Other Ambulatory Visit (HOSPITAL_BASED_OUTPATIENT_CLINIC_OR_DEPARTMENT_OTHER): Payer: Medicare Other

## 2017-02-23 VITALS — BP 130/66 | HR 107 | Temp 98.9°F | Resp 18 | Ht 62.0 in | Wt 148.4 lb

## 2017-02-23 DIAGNOSIS — C3492 Malignant neoplasm of unspecified part of left bronchus or lung: Secondary | ICD-10-CM

## 2017-02-23 DIAGNOSIS — E785 Hyperlipidemia, unspecified: Secondary | ICD-10-CM | POA: Diagnosis not present

## 2017-02-23 DIAGNOSIS — Z8673 Personal history of transient ischemic attack (TIA), and cerebral infarction without residual deficits: Secondary | ICD-10-CM | POA: Diagnosis not present

## 2017-02-23 DIAGNOSIS — C3412 Malignant neoplasm of upper lobe, left bronchus or lung: Secondary | ICD-10-CM

## 2017-02-23 DIAGNOSIS — E039 Hypothyroidism, unspecified: Secondary | ICD-10-CM

## 2017-02-23 DIAGNOSIS — Z5111 Encounter for antineoplastic chemotherapy: Secondary | ICD-10-CM

## 2017-02-23 DIAGNOSIS — Z87891 Personal history of nicotine dependence: Secondary | ICD-10-CM | POA: Diagnosis not present

## 2017-02-23 DIAGNOSIS — I10 Essential (primary) hypertension: Secondary | ICD-10-CM | POA: Diagnosis not present

## 2017-02-23 DIAGNOSIS — J449 Chronic obstructive pulmonary disease, unspecified: Secondary | ICD-10-CM | POA: Diagnosis not present

## 2017-02-23 LAB — COMPREHENSIVE METABOLIC PANEL
ALBUMIN: 3.1 g/dL — AB (ref 3.5–5.0)
ALK PHOS: 95 U/L (ref 40–150)
ALT: 14 U/L (ref 0–55)
AST: 11 U/L (ref 5–34)
Anion Gap: 10 mEq/L (ref 3–11)
BILIRUBIN TOTAL: 0.37 mg/dL (ref 0.20–1.20)
BUN: 13.5 mg/dL (ref 7.0–26.0)
CALCIUM: 9 mg/dL (ref 8.4–10.4)
CHLORIDE: 103 meq/L (ref 98–109)
CO2: 27 mEq/L (ref 22–29)
CREATININE: 0.8 mg/dL (ref 0.6–1.1)
EGFR: 83 mL/min/{1.73_m2} — ABNORMAL LOW (ref >90)
GLUCOSE: 103 mg/dL (ref 70–140)
POTASSIUM: 3.5 meq/L (ref 3.5–5.1)
SODIUM: 140 meq/L (ref 136–145)
Total Protein: 6.6 g/dL (ref 6.4–8.3)

## 2017-02-23 LAB — CBC WITH DIFFERENTIAL/PLATELET
BASO%: 0.4 % (ref 0.0–2.0)
BASOS ABS: 0 10*3/uL (ref 0.0–0.1)
EOS%: 0.9 % (ref 0.0–7.0)
Eosinophils Absolute: 0 10*3/uL (ref 0.0–0.5)
HEMATOCRIT: 30.6 % — AB (ref 34.8–46.6)
HEMOGLOBIN: 9.9 g/dL — AB (ref 11.6–15.9)
LYMPH#: 0.4 10*3/uL — AB (ref 0.9–3.3)
LYMPH%: 7.6 % — ABNORMAL LOW (ref 14.0–49.7)
MCH: 27.6 pg (ref 25.1–34.0)
MCHC: 32.4 g/dL (ref 31.5–36.0)
MCV: 85.2 fL (ref 79.5–101.0)
MONO#: 0.6 10*3/uL (ref 0.1–0.9)
MONO%: 13.1 % (ref 0.0–14.0)
NEUT#: 3.6 10*3/uL (ref 1.5–6.5)
NEUT%: 78 % — ABNORMAL HIGH (ref 38.4–76.8)
Platelets: 216 10*3/uL (ref 145–400)
RBC: 3.59 10*6/uL — ABNORMAL LOW (ref 3.70–5.45)
RDW: 14.9 % — AB (ref 11.2–14.5)
WBC: 4.6 10*3/uL (ref 3.9–10.3)

## 2017-02-23 MED ORDER — SODIUM CHLORIDE 0.9 % IV SOLN
Freq: Once | INTRAVENOUS | Status: AC
  Administered 2017-02-23: 12:00:00 via INTRAVENOUS

## 2017-02-23 MED ORDER — SODIUM CHLORIDE 0.9 % IV SOLN
20.0000 mg | Freq: Once | INTRAVENOUS | Status: AC
  Administered 2017-02-23: 20 mg via INTRAVENOUS
  Filled 2017-02-23: qty 2

## 2017-02-23 MED ORDER — SODIUM CHLORIDE 0.9% FLUSH
10.0000 mL | INTRAVENOUS | Status: DC | PRN
  Administered 2017-02-23: 10 mL
  Filled 2017-02-23: qty 10

## 2017-02-23 MED ORDER — SODIUM CHLORIDE 0.9 % IV SOLN
212.4000 mg | Freq: Once | INTRAVENOUS | Status: AC
  Administered 2017-02-23: 210 mg via INTRAVENOUS
  Filled 2017-02-23: qty 21

## 2017-02-23 MED ORDER — SONAFINE EX EMUL
1.0000 "application " | Freq: Once | CUTANEOUS | Status: AC
  Administered 2017-02-23: 1 via TOPICAL

## 2017-02-23 MED ORDER — PALONOSETRON HCL INJECTION 0.25 MG/5ML
0.2500 mg | Freq: Once | INTRAVENOUS | Status: AC
  Administered 2017-02-23: 0.25 mg via INTRAVENOUS

## 2017-02-23 MED ORDER — FAMOTIDINE IN NACL 20-0.9 MG/50ML-% IV SOLN
20.0000 mg | Freq: Once | INTRAVENOUS | Status: AC
  Administered 2017-02-23: 20 mg via INTRAVENOUS

## 2017-02-23 MED ORDER — FAMOTIDINE IN NACL 20-0.9 MG/50ML-% IV SOLN
INTRAVENOUS | Status: AC
  Filled 2017-02-23: qty 50

## 2017-02-23 MED ORDER — PACLITAXEL CHEMO INJECTION 300 MG/50ML
45.0000 mg/m2 | Freq: Once | INTRAVENOUS | Status: AC
  Administered 2017-02-23: 78 mg via INTRAVENOUS
  Filled 2017-02-23: qty 13

## 2017-02-23 MED ORDER — DIPHENHYDRAMINE HCL 50 MG/ML IJ SOLN
50.0000 mg | Freq: Once | INTRAMUSCULAR | Status: AC
  Administered 2017-02-23: 50 mg via INTRAVENOUS

## 2017-02-23 MED ORDER — PALONOSETRON HCL INJECTION 0.25 MG/5ML
INTRAVENOUS | Status: AC
  Filled 2017-02-23: qty 5

## 2017-02-23 MED ORDER — HEPARIN SOD (PORK) LOCK FLUSH 100 UNIT/ML IV SOLN
500.0000 [IU] | Freq: Once | INTRAVENOUS | Status: AC | PRN
  Administered 2017-02-23: 500 [IU]
  Filled 2017-02-23: qty 5

## 2017-02-23 MED ORDER — DIPHENHYDRAMINE HCL 50 MG/ML IJ SOLN
INTRAMUSCULAR | Status: AC
  Filled 2017-02-23: qty 1

## 2017-02-23 NOTE — Telephone Encounter (Signed)
No orsers pr 9/5 los

## 2017-02-23 NOTE — Progress Notes (Signed)
[facility] Follow up:    Felicia Herrera, [name], MD [address] [place] [address]   DIAGNOSIS: Stage IIB(T2b, N1, M0) non-small cell lung cancer, adenocarcinoma presented with large left upper lobe lung mass in addition to left hilar lymphadenopathy diagnosed in April 2018. Her previous workup was done at [facility] in [facility]. There was insufficient material to perform the molecular studies.  GUARDANT 360 Molecular studies: BRCA2 N354f. Negative for EGFR, ALK, ROS1, BRAF mutations.  SUMMARY OF ONCOLOGIC HISTORY:  No history exists.    CURRENT THERAPY: A course of concurrent chemoradiation with weekly carboplatin for AUC of 2 and paclitaxel 45 MG/M2. First dose 01/24/2017. Status post 4 cycles.  INTERVAL HISTORY: Felicia Ensz61y.o. female returns for Routine follow-up by herself. The patient continued to tolerate her treatment fairly well. Felicia Herrera denies having any fevers or chills. Denies nausea, vomiting, diarrhea, but does have some constipation. Felicia Herrera is using MiraLAX as needed. Denies decrease in appetite or weight loss. Denies chest pain, shortness of breath, cough, hemoptysis. The patient is here for evaluation prior to cycle #5 of her chemotherapy.   Patient Active Problem List   Diagnosis Date Noted  . Stage 2 moderate COPD by GOLD classification ([place] 02/22/2017  . Adenocarcinoma of left lung, stage 2 ([place] 12/27/2016  . Encounter for antineoplastic chemotherapy 12/27/2016  . Goals of care, counseling/discussion 12/27/2016  . GERD (gastroesophageal reflux disease) 12/17/2016  . Hypertension, essential, benign 12/17/2016  . History of COPD 12/09/2016  . History of lung cancer 12/09/2016  . Lung cancer ([place] 11/04/2016  . Primary hypothyroidism 11/04/2016    has No Known Allergies.  MEDICAL HISTORY: Past Medical History:  Diagnosis Date  . Adenocarcinoma of left lung, stage 2 ([place] 12/27/2016  . Cancer ([facility]     Lun cancer: Right Dx 2008, s/p lobectomy. Left Dx 09/2016  . COPD (chronic obstructive pulmonary disease) ([place]   . Encounter for antineoplastic chemotherapy 12/27/2016  . Hyperlipidemia   . Hypertension   . Stroke ([facility]     SURGICAL HISTORY: Past Surgical History:  Procedure Laterality Date  . BREAST BIOPSY     several. Denies Hx of breast cancer.  . IR FLUORO GUIDE PORT INSERTION RIGHT  02/04/2017  . IR [place] V[place] ACCESS RIGHT  02/04/2017  . THORACOTOMY/LOBECTOMY Right 2008  . TONSILLECTOMY  1960    SOCIAL HISTORY: Social History   Social History  . Marital status: Single    Spouse name: N/A  . Number of children: N/A  . Years of education: N/A   Occupational History  . landscaping    Social History Main Topics  . Smoking status: Former Smoker    Packs/day: 1.00    Years: 30.00    Types: Cigarettes    Quit date: 06/21/2005  . Smokeless tobacco: Never Used  . Alcohol use No  . Drug use: No  . Sexual activity: Not Currently   Other Topics Concern  . Not on file   Social History Narrative   Landscaper.    Lives alone.     FAMILY HISTORY: Family History  Problem Relation Age of Onset  . Arthritis Mother   . Lung cancer Mother 459      d.45 from cancer  . Hypertension Father   . Arthritis Father   . Pancreatic cancer Sister 60       d.60 from cancer  . Brain cancer Brother  d.~70  . Colon cancer Brother        d.~70  . Hypertension Brother   . Mental retardation Brother   . Melanoma Brother 34  . Lung cancer Maternal Aunt 66  . Colon cancer Maternal Grandfather   . Depression Neg Hx   . Heart disease Neg Hx     Review of Systems  Constitutional: Negative.   HENT:  Negative.   Eyes: Negative.   Respiratory: Negative.   Cardiovascular: Negative.   Gastrointestinal: Positive for constipation. Negative for abdominal pain, diarrhea, nausea and vomiting.  Genitourinary: Negative.    Musculoskeletal: Negative.   Skin: Negative.    Neurological: Negative.   Hematological: Negative.   Psychiatric/Behavioral: Negative.       PHYSICAL EXAMINATION  ECOG PERFORMANCE STATUS: 1 - Symptomatic but completely ambulatory  Vitals:   02/23/17 1101  BP: 130/66  Pulse: (!) 107  Resp: 18  Temp: 98.9 F (37.2 C)  SpO2: 97%    Physical Exam  Constitutional: Felicia Herrera is oriented to person, place, and time and well-developed, well-nourished, and in no distress. No distress.  HENT:  Head: Normocephalic.  Mouth/Throat: Oropharynx is clear and moist. No oropharyngeal exudate.  Eyes: Conjunctivae are normal. No scleral icterus.  Neck: Normal range of motion. Neck supple.  Cardiovascular: Normal rate, regular rhythm, normal heart sounds and intact distal pulses.   Pulmonary/Chest: Effort normal and breath sounds normal. No respiratory distress. Felicia Herrera has no wheezes. Felicia Herrera has no rales.  Abdominal: Soft. Bowel sounds are normal. Felicia Herrera exhibits no distension and no mass. There is no tenderness.  Musculoskeletal: Normal range of motion. Felicia Herrera exhibits no edema.  Lymphadenopathy:    Felicia Herrera has no cervical adenopathy.  Neurological: Felicia Herrera is alert and oriented to person, place, and time. Felicia Herrera exhibits normal muscle tone. Gait normal.  Skin: Skin is warm and dry. No rash noted. Felicia Herrera is not diaphoretic. No erythema. No pallor.  Psychiatric: Mood, memory, affect and judgment normal.  Vitals reviewed.   LABORATORY DATA:  CBC    Component Value Date/Time   WBC 4.6 02/23/2017 0918   WBC 7.3 02/04/2017 1248   RBC 3.59 (L) 02/23/2017 0918   RBC 3.83 (L) 02/04/2017 1248   HGB 9.9 (L) 02/23/2017 0918   HCT 30.6 (L) 02/23/2017 0918   PLT 216 02/23/2017 0918   MCV 85.2 02/23/2017 0918   MCH 27.6 02/23/2017 0918   MCH 27.4 02/04/2017 1248   MCHC 32.4 02/23/2017 0918   MCHC 32.9 02/04/2017 1248   RDW 14.9 (H) 02/23/2017 0918   LYMPHSABS 0.4 (L) 02/23/2017 0918   MONOABS 0.6 02/23/2017 0918   EOSABS 0.0 02/23/2017 0918   BASOSABS 0.0 02/23/2017  0918    CMP     Component Value Date/Time   NA 140 02/23/2017 0918   K 3.5 02/23/2017 0918   CL 102 11/04/2016 1255   CO2 27 02/23/2017 0918   GLUCOSE 103 02/23/2017 0918   BUN 13.5 02/23/2017 0918   CREATININE 0.8 02/23/2017 0918   CALCIUM 9.0 02/23/2017 0918   PROT 6.6 02/23/2017 0918   ALBUMIN 3.1 (L) 02/23/2017 0918   AST 11 02/23/2017 0918   ALT 14 02/23/2017 0918   ALKPHOS 95 02/23/2017 0918   BILITOT 0.37 02/23/2017 0918    RADIOGRAPHIC STUDIES:  No results found.  ASSESSMENT and THERAPY PLAN:   Adenocarcinoma of left lung, stage 2 ([place]) This is a very pleasant 61 year old white female recently diagnosed with unresectable a stage IIB non-small cell lung cancer,  adenocarcinoma. The patient is currently undergoing a course of concurrent chemoradiation with weekly carboplatin and paclitaxel is status post 4 cycles and has been tolerating this treatment fairly well with no significant adverse effects. I recommended for her to proceed with cycle #5 today as scheduled.  Follow-up visit in 2 weeks for evaluation before starting cycle #7. Felicia Herrera was advised to call immediately if Felicia Herrera has any concerning symptoms in the interval. The patient voices understanding of current disease status and treatment options and is in agreement with the current care plan.   No orders of the defined types were placed in this encounter.   All questions were answered. The patient knows to call the clinic with any problems, questions or concerns. We can certainly see the patient much sooner if necessary.  Mikey Bussing, NP 02/23/2017

## 2017-02-23 NOTE — Patient Instructions (Signed)
[facility] Discharge Instructions for Patients Receiving Chemotherapy  Today you received the following chemotherapy agents Taxol and Carboplatin. To help prevent nausea and vomiting after your treatment, we encourage you to take your nausea medication as directed.  If you develop nausea and vomiting that is not controlled by your nausea medication, call the clinic.   BELOW ARE SYMPTOMS THAT SHOULD BE REPORTED IMMEDIATELY:  *FEVER GREATER THAN 100.5 F  *CHILLS WITH OR WITHOUT FEVER  NAUSEA AND VOMITING THAT IS NOT CONTROLLED WITH YOUR NAUSEA MEDICATION  *UNUSUAL SHORTNESS OF BREATH  *UNUSUAL BRUISING OR BLEEDING  TENDERNESS IN MOUTH AND THROAT WITH OR WITHOUT PRESENCE OF ULCERS  *URINARY PROBLEMS  *BOWEL PROBLEMS  UNUSUAL RASH Items with * indicate a potential emergency and should be followed up as soon as possible.  Feel free to call the clinic you have any questions or concerns. The clinic phone number is [phone].  Please show the CHEMO ALERT CARD at check-in to the Emergency Department and triage nurse.    

## 2017-02-23 NOTE — Assessment & Plan Note (Signed)
This is a very pleasant 61 year old white female recently diagnosed with unresectable a stage IIB non-small cell lung cancer, adenocarcinoma. The patient is currently undergoing a course of concurrent chemoradiation with weekly carboplatin and paclitaxel is status post 4 cycles and has been tolerating this treatment fairly well with no significant adverse effects. I recommended for her to proceed with cycle #5 today as scheduled.  Follow-up visit in 2 weeks for evaluation before starting cycle #7. She was advised to call immediately if she has any concerning symptoms in the interval. The patient voices understanding of current disease status and treatment options and is in agreement with the current care plan.

## 2017-02-24 ENCOUNTER — Ambulatory Visit
Admission: RE | Admit: 2017-02-24 | Discharge: 2017-02-24 | Disposition: A | Discharge status: Home / Self Care | Payer: Medicare Other | Source: Ambulatory Visit | Attending: Radiation Oncology | Admitting: Radiation Oncology

## 2017-02-24 ENCOUNTER — Telehealth: Payer: Self-pay | Admitting: Genetics

## 2017-02-24 DIAGNOSIS — Z87891 Personal history of nicotine dependence: Secondary | ICD-10-CM | POA: Diagnosis not present

## 2017-02-24 DIAGNOSIS — I10 Essential (primary) hypertension: Secondary | ICD-10-CM | POA: Diagnosis not present

## 2017-02-24 DIAGNOSIS — E785 Hyperlipidemia, unspecified: Secondary | ICD-10-CM | POA: Diagnosis not present

## 2017-02-24 DIAGNOSIS — C3492 Malignant neoplasm of unspecified part of left bronchus or lung: Secondary | ICD-10-CM | POA: Diagnosis not present

## 2017-02-24 DIAGNOSIS — Z8673 Personal history of transient ischemic attack (TIA), and cerebral infarction without residual deficits: Secondary | ICD-10-CM | POA: Diagnosis not present

## 2017-02-24 DIAGNOSIS — J449 Chronic obstructive pulmonary disease, unspecified: Secondary | ICD-10-CM | POA: Diagnosis not present

## 2017-02-24 DIAGNOSIS — C3412 Malignant neoplasm of upper lobe, left bronchus or lung: Secondary | ICD-10-CM | POA: Diagnosis not present

## 2017-02-24 NOTE — Telephone Encounter (Signed)
-----  Message from Mal Misty sent at 02/08/2017  3:49 PM EDT ----- Regarding: Call Results Guardant360 BRCA2 variant. Route to Dr. Julien Nordmann.

## 2017-02-25 ENCOUNTER — Ambulatory Visit
Admission: RE | Admit: 2017-02-25 | Discharge: 2017-02-25 | Disposition: A | Discharge status: Home / Self Care | Payer: Medicare Other | Source: Ambulatory Visit | Attending: Radiation Oncology | Admitting: Radiation Oncology

## 2017-02-25 DIAGNOSIS — C3492 Malignant neoplasm of unspecified part of left bronchus or lung: Secondary | ICD-10-CM | POA: Diagnosis not present

## 2017-02-25 DIAGNOSIS — Z8673 Personal history of transient ischemic attack (TIA), and cerebral infarction without residual deficits: Secondary | ICD-10-CM | POA: Diagnosis not present

## 2017-02-25 DIAGNOSIS — E785 Hyperlipidemia, unspecified: Secondary | ICD-10-CM | POA: Diagnosis not present

## 2017-02-25 DIAGNOSIS — J449 Chronic obstructive pulmonary disease, unspecified: Secondary | ICD-10-CM | POA: Diagnosis not present

## 2017-02-25 DIAGNOSIS — Z87891 Personal history of nicotine dependence: Secondary | ICD-10-CM | POA: Diagnosis not present

## 2017-02-25 DIAGNOSIS — I10 Essential (primary) hypertension: Secondary | ICD-10-CM | POA: Diagnosis not present

## 2017-02-25 DIAGNOSIS — C3412 Malignant neoplasm of upper lobe, left bronchus or lung: Secondary | ICD-10-CM | POA: Diagnosis not present

## 2017-02-25 NOTE — Progress Notes (Signed)
Felicia Herrera presented after her radiation treatment to nursing. She complained of Right sided pain that radiated over her Right Breast. She stated the pain started yesterday one hour after her radiation treatment. She stated the pain was constant but was worse when she took a deep breath. She was very upset and crying in the exam room. I notified Dr. Isidore Moos and she agreed to examine the patient. However, before Dr. Isidore Moos could examine her, I observed her leaving the nursing area. I followed her and asked her where she was going. She stated she just wanted to go home and lay down. I encouraged her to stay and be examined by Dr. Isidore Moos, but she declined. She is aware to call if her symptoms become more severe or if she needs anything from [place].

## 2017-02-28 ENCOUNTER — Ambulatory Visit
Admission: RE | Admit: 2017-02-28 | Discharge: 2017-02-28 | Disposition: A | Discharge status: Home / Self Care | Payer: Medicare Other | Source: Ambulatory Visit | Attending: Radiation Oncology | Admitting: Radiation Oncology

## 2017-02-28 ENCOUNTER — Ambulatory Visit (HOSPITAL_BASED_OUTPATIENT_CLINIC_OR_DEPARTMENT_OTHER): Payer: Medicare Other

## 2017-02-28 ENCOUNTER — Other Ambulatory Visit (HOSPITAL_BASED_OUTPATIENT_CLINIC_OR_DEPARTMENT_OTHER): Payer: Medicare Other

## 2017-02-28 VITALS — BP 118/86 | HR 96 | Temp 99.0°F | Resp 18

## 2017-02-28 DIAGNOSIS — Z8673 Personal history of transient ischemic attack (TIA), and cerebral infarction without residual deficits: Secondary | ICD-10-CM | POA: Diagnosis not present

## 2017-02-28 DIAGNOSIS — Z5111 Encounter for antineoplastic chemotherapy: Secondary | ICD-10-CM | POA: Diagnosis not present

## 2017-02-28 DIAGNOSIS — Z87891 Personal history of nicotine dependence: Secondary | ICD-10-CM | POA: Diagnosis not present

## 2017-02-28 DIAGNOSIS — C3492 Malignant neoplasm of unspecified part of left bronchus or lung: Secondary | ICD-10-CM

## 2017-02-28 DIAGNOSIS — C3412 Malignant neoplasm of upper lobe, left bronchus or lung: Secondary | ICD-10-CM

## 2017-02-28 DIAGNOSIS — I10 Essential (primary) hypertension: Secondary | ICD-10-CM | POA: Diagnosis not present

## 2017-02-28 DIAGNOSIS — E785 Hyperlipidemia, unspecified: Secondary | ICD-10-CM | POA: Diagnosis not present

## 2017-02-28 DIAGNOSIS — J449 Chronic obstructive pulmonary disease, unspecified: Secondary | ICD-10-CM | POA: Diagnosis not present

## 2017-02-28 LAB — COMPREHENSIVE METABOLIC PANEL
ALBUMIN: 3.1 g/dL — AB (ref 3.5–5.0)
ALT: 9 U/L (ref 0–55)
AST: 10 U/L (ref 5–34)
Alkaline Phosphatase: 88 U/L (ref 40–150)
Anion Gap: 10 mEq/L (ref 3–11)
BUN: 17.2 mg/dL (ref 7.0–26.0)
CALCIUM: 9.1 mg/dL (ref 8.4–10.4)
CHLORIDE: 104 meq/L (ref 98–109)
CO2: 26 mEq/L (ref 22–29)
CREATININE: 0.7 mg/dL (ref 0.6–1.1)
EGFR: >90 mL/min/{1.73_m2} (ref >90)
Glucose: 95 mg/dl (ref 70–140)
Potassium: 3.6 mEq/L (ref 3.5–5.1)
Sodium: 139 mEq/L (ref 136–145)
Total Bilirubin: 0.6 mg/dL (ref 0.20–1.20)
Total Protein: 6.8 g/dL (ref 6.4–8.3)

## 2017-02-28 LAB — CBC WITH DIFFERENTIAL/PLATELET
BASO%: 0.6 % (ref 0.0–2.0)
Basophils Absolute: 0 10*3/uL (ref 0.0–0.1)
EOS%: 1.1 % (ref 0.0–7.0)
Eosinophils Absolute: 0 10*3/uL (ref 0.0–0.5)
HEMATOCRIT: 31.4 % — AB (ref 34.8–46.6)
HEMOGLOBIN: 10.2 g/dL — AB (ref 11.6–15.9)
LYMPH#: 0.4 10*3/uL — AB (ref 0.9–3.3)
LYMPH%: 9.7 % — ABNORMAL LOW (ref 14.0–49.7)
MCH: 27.6 pg (ref 25.1–34.0)
MCHC: 32.5 g/dL (ref 31.5–36.0)
MCV: 84.9 fL (ref 79.5–101.0)
MONO#: 0.3 10*3/uL (ref 0.1–0.9)
MONO%: 8.6 % (ref 0.0–14.0)
NEUT%: 80 % — ABNORMAL HIGH (ref 38.4–76.8)
NEUTROS ABS: 2.9 10*3/uL (ref 1.5–6.5)
Platelets: 215 10*3/uL (ref 145–400)
RBC: 3.7 10*6/uL (ref 3.70–5.45)
RDW: 15.4 % — AB (ref 11.2–14.5)
WBC: 3.6 10*3/uL — ABNORMAL LOW (ref 3.9–10.3)

## 2017-02-28 MED ORDER — PALONOSETRON HCL INJECTION 0.25 MG/5ML
INTRAVENOUS | Status: AC
  Filled 2017-02-28: qty 5

## 2017-02-28 MED ORDER — SODIUM CHLORIDE 0.9 % IV SOLN
20.0000 mg | Freq: Once | INTRAVENOUS | Status: AC
  Administered 2017-02-28: 20 mg via INTRAVENOUS
  Filled 2017-02-28: qty 2

## 2017-02-28 MED ORDER — DIPHENHYDRAMINE HCL 50 MG/ML IJ SOLN
50.0000 mg | Freq: Once | INTRAMUSCULAR | Status: AC
  Administered 2017-02-28: 50 mg via INTRAVENOUS

## 2017-02-28 MED ORDER — SODIUM CHLORIDE 0.9 % IV SOLN
212.4000 mg | Freq: Once | INTRAVENOUS | Status: AC
  Administered 2017-02-28: 210 mg via INTRAVENOUS
  Filled 2017-02-28: qty 21

## 2017-02-28 MED ORDER — SODIUM CHLORIDE 0.9% FLUSH
10.0000 mL | INTRAVENOUS | Status: DC | PRN
  Administered 2017-02-28: 10 mL
  Filled 2017-02-28: qty 10

## 2017-02-28 MED ORDER — SODIUM CHLORIDE 0.9 % IV SOLN
Freq: Once | INTRAVENOUS | Status: AC
  Administered 2017-02-28: 13:00:00 via INTRAVENOUS

## 2017-02-28 MED ORDER — DIPHENHYDRAMINE HCL 50 MG/ML IJ SOLN
INTRAMUSCULAR | Status: AC
  Filled 2017-02-28: qty 1

## 2017-02-28 MED ORDER — PACLITAXEL CHEMO INJECTION 300 MG/50ML
45.0000 mg/m2 | Freq: Once | INTRAVENOUS | Status: AC
  Administered 2017-02-28: 78 mg via INTRAVENOUS
  Filled 2017-02-28: qty 13

## 2017-02-28 MED ORDER — HEPARIN SOD (PORK) LOCK FLUSH 100 UNIT/ML IV SOLN
500.0000 [IU] | Freq: Once | INTRAVENOUS | Status: AC | PRN
Start: 2017-02-28 — End: 2017-02-28
  Administered 2017-02-28: 500 [IU]
  Filled 2017-02-28: qty 5

## 2017-02-28 MED ORDER — FAMOTIDINE IN NACL 20-0.9 MG/50ML-% IV SOLN
INTRAVENOUS | Status: AC
  Filled 2017-02-28: qty 50

## 2017-02-28 MED ORDER — FAMOTIDINE IN NACL 20-0.9 MG/50ML-% IV SOLN
20.0000 mg | Freq: Once | INTRAVENOUS | Status: AC
  Administered 2017-02-28: 20 mg via INTRAVENOUS

## 2017-02-28 MED ORDER — PALONOSETRON HCL INJECTION 0.25 MG/5ML
0.2500 mg | Freq: Once | INTRAVENOUS | Status: AC
  Administered 2017-02-28: 0.25 mg via INTRAVENOUS

## 2017-02-28 NOTE — Patient Instructions (Signed)
[facility] Discharge Instructions for Patients Receiving Chemotherapy  Today you received the following chemotherapy agents Taxol and Carboplatin. To help prevent nausea and vomiting after your treatment, we encourage you to take your nausea medication as directed.  If you develop nausea and vomiting that is not controlled by your nausea medication, call the clinic.   BELOW ARE SYMPTOMS THAT SHOULD BE REPORTED IMMEDIATELY:  *FEVER GREATER THAN 100.5 F  *CHILLS WITH OR WITHOUT FEVER  NAUSEA AND VOMITING THAT IS NOT CONTROLLED WITH YOUR NAUSEA MEDICATION  *UNUSUAL SHORTNESS OF BREATH  *UNUSUAL BRUISING OR BLEEDING  TENDERNESS IN MOUTH AND THROAT WITH OR WITHOUT PRESENCE OF ULCERS  *URINARY PROBLEMS  *BOWEL PROBLEMS  UNUSUAL RASH Items with * indicate a potential emergency and should be followed up as soon as possible.  Feel free to call the clinic you have any questions or concerns. The clinic phone number is [phone].  Please show the CHEMO ALERT CARD at check-in to the Emergency Department and triage nurse.    

## 2017-03-01 ENCOUNTER — Other Ambulatory Visit: Payer: Self-pay | Admitting: Radiation Oncology

## 2017-03-01 ENCOUNTER — Ambulatory Visit
Admission: RE | Admit: 2017-03-01 | Discharge: 2017-03-01 | Disposition: A | Discharge status: Home / Self Care | Payer: Medicare Other | Source: Ambulatory Visit | Attending: Radiation Oncology | Admitting: Radiation Oncology

## 2017-03-01 DIAGNOSIS — C3492 Malignant neoplasm of unspecified part of left bronchus or lung: Secondary | ICD-10-CM

## 2017-03-01 DIAGNOSIS — Z8673 Personal history of transient ischemic attack (TIA), and cerebral infarction without residual deficits: Secondary | ICD-10-CM | POA: Diagnosis not present

## 2017-03-01 DIAGNOSIS — C3412 Malignant neoplasm of upper lobe, left bronchus or lung: Secondary | ICD-10-CM | POA: Diagnosis not present

## 2017-03-01 DIAGNOSIS — Z87891 Personal history of nicotine dependence: Secondary | ICD-10-CM | POA: Diagnosis not present

## 2017-03-01 DIAGNOSIS — J449 Chronic obstructive pulmonary disease, unspecified: Secondary | ICD-10-CM | POA: Diagnosis not present

## 2017-03-01 DIAGNOSIS — I10 Essential (primary) hypertension: Secondary | ICD-10-CM | POA: Diagnosis not present

## 2017-03-01 DIAGNOSIS — E785 Hyperlipidemia, unspecified: Secondary | ICD-10-CM | POA: Diagnosis not present

## 2017-03-01 MED ORDER — OXYCODONE-ACETAMINOPHEN 5-325 MG PO TABS: 1.0000 | ORAL_TABLET | ORAL | 0 refills | Status: DC | PRN

## 2017-03-02 ENCOUNTER — Ambulatory Visit
Admission: RE | Admit: 2017-03-02 | Discharge: 2017-03-02 | Disposition: A | Discharge status: Home / Self Care | Payer: Medicare Other | Source: Ambulatory Visit | Attending: Radiation Oncology | Admitting: Radiation Oncology

## 2017-03-02 DIAGNOSIS — C3412 Malignant neoplasm of upper lobe, left bronchus or lung: Secondary | ICD-10-CM | POA: Diagnosis not present

## 2017-03-02 DIAGNOSIS — Z8673 Personal history of transient ischemic attack (TIA), and cerebral infarction without residual deficits: Secondary | ICD-10-CM | POA: Diagnosis not present

## 2017-03-02 DIAGNOSIS — E785 Hyperlipidemia, unspecified: Secondary | ICD-10-CM | POA: Diagnosis not present

## 2017-03-02 DIAGNOSIS — I10 Essential (primary) hypertension: Secondary | ICD-10-CM | POA: Diagnosis not present

## 2017-03-02 DIAGNOSIS — Z87891 Personal history of nicotine dependence: Secondary | ICD-10-CM | POA: Diagnosis not present

## 2017-03-02 DIAGNOSIS — J449 Chronic obstructive pulmonary disease, unspecified: Secondary | ICD-10-CM | POA: Diagnosis not present

## 2017-03-02 DIAGNOSIS — C3492 Malignant neoplasm of unspecified part of left bronchus or lung: Secondary | ICD-10-CM | POA: Diagnosis not present

## 2017-03-03 ENCOUNTER — Ambulatory Visit
Admission: RE | Admit: 2017-03-03 | Discharge: 2017-03-03 | Disposition: A | Discharge status: Home / Self Care | Payer: Medicare Other | Source: Ambulatory Visit | Attending: Radiation Oncology | Admitting: Radiation Oncology

## 2017-03-03 DIAGNOSIS — Z8673 Personal history of transient ischemic attack (TIA), and cerebral infarction without residual deficits: Secondary | ICD-10-CM | POA: Diagnosis not present

## 2017-03-03 DIAGNOSIS — C3492 Malignant neoplasm of unspecified part of left bronchus or lung: Secondary | ICD-10-CM | POA: Diagnosis not present

## 2017-03-03 DIAGNOSIS — J449 Chronic obstructive pulmonary disease, unspecified: Secondary | ICD-10-CM | POA: Diagnosis not present

## 2017-03-03 DIAGNOSIS — I10 Essential (primary) hypertension: Secondary | ICD-10-CM | POA: Diagnosis not present

## 2017-03-03 DIAGNOSIS — E785 Hyperlipidemia, unspecified: Secondary | ICD-10-CM | POA: Diagnosis not present

## 2017-03-03 DIAGNOSIS — C3412 Malignant neoplasm of upper lobe, left bronchus or lung: Secondary | ICD-10-CM | POA: Diagnosis not present

## 2017-03-03 DIAGNOSIS — Z87891 Personal history of nicotine dependence: Secondary | ICD-10-CM | POA: Diagnosis not present

## 2017-03-04 ENCOUNTER — Ambulatory Visit: Payer: Medicare Other

## 2017-03-04 ENCOUNTER — Ambulatory Visit
Admission: RE | Admit: 2017-03-04 | Discharge: 2017-03-04 | Disposition: A | Discharge status: Home / Self Care | Payer: Medicare Other | Source: Ambulatory Visit | Attending: Radiation Oncology | Admitting: Radiation Oncology

## 2017-03-04 DIAGNOSIS — C3492 Malignant neoplasm of unspecified part of left bronchus or lung: Secondary | ICD-10-CM | POA: Diagnosis not present

## 2017-03-04 DIAGNOSIS — E785 Hyperlipidemia, unspecified: Secondary | ICD-10-CM | POA: Diagnosis not present

## 2017-03-04 DIAGNOSIS — C3412 Malignant neoplasm of upper lobe, left bronchus or lung: Secondary | ICD-10-CM | POA: Diagnosis not present

## 2017-03-04 DIAGNOSIS — Z8673 Personal history of transient ischemic attack (TIA), and cerebral infarction without residual deficits: Secondary | ICD-10-CM | POA: Diagnosis not present

## 2017-03-04 DIAGNOSIS — Z87891 Personal history of nicotine dependence: Secondary | ICD-10-CM | POA: Diagnosis not present

## 2017-03-04 DIAGNOSIS — J449 Chronic obstructive pulmonary disease, unspecified: Secondary | ICD-10-CM | POA: Diagnosis not present

## 2017-03-04 DIAGNOSIS — I10 Essential (primary) hypertension: Secondary | ICD-10-CM | POA: Diagnosis not present

## 2017-03-07 ENCOUNTER — Other Ambulatory Visit (HOSPITAL_BASED_OUTPATIENT_CLINIC_OR_DEPARTMENT_OTHER): Payer: Medicare Other

## 2017-03-07 ENCOUNTER — Ambulatory Visit (HOSPITAL_BASED_OUTPATIENT_CLINIC_OR_DEPARTMENT_OTHER): Payer: Medicare Other | Admitting: Oncology

## 2017-03-07 ENCOUNTER — Telehealth: Payer: Self-pay | Admitting: Genetic Counselor

## 2017-03-07 ENCOUNTER — Ambulatory Visit (HOSPITAL_BASED_OUTPATIENT_CLINIC_OR_DEPARTMENT_OTHER): Payer: Medicare Other

## 2017-03-07 ENCOUNTER — Ambulatory Visit: Payer: Medicare Other

## 2017-03-07 ENCOUNTER — Encounter: Payer: Self-pay | Admitting: Genetic Counselor

## 2017-03-07 ENCOUNTER — Ambulatory Visit
Admission: RE | Admit: 2017-03-07 | Discharge: 2017-03-07 | Disposition: A | Discharge status: Home / Self Care | Payer: Medicare Other | Source: Ambulatory Visit | Attending: Radiation Oncology | Admitting: Radiation Oncology

## 2017-03-07 ENCOUNTER — Encounter: Payer: Self-pay | Admitting: Oncology

## 2017-03-07 ENCOUNTER — Telehealth: Payer: Self-pay | Admitting: Oncology

## 2017-03-07 VITALS — BP 145/63 | HR 61 | Temp 98.4°F | Resp 18 | Ht 62.0 in | Wt 147.5 lb

## 2017-03-07 DIAGNOSIS — Z8673 Personal history of transient ischemic attack (TIA), and cerebral infarction without residual deficits: Secondary | ICD-10-CM | POA: Diagnosis not present

## 2017-03-07 DIAGNOSIS — I10 Essential (primary) hypertension: Secondary | ICD-10-CM

## 2017-03-07 DIAGNOSIS — C3492 Malignant neoplasm of unspecified part of left bronchus or lung: Secondary | ICD-10-CM

## 2017-03-07 DIAGNOSIS — Z5111 Encounter for antineoplastic chemotherapy: Secondary | ICD-10-CM

## 2017-03-07 DIAGNOSIS — R05 Cough: Secondary | ICD-10-CM | POA: Diagnosis not present

## 2017-03-07 DIAGNOSIS — Z87891 Personal history of nicotine dependence: Secondary | ICD-10-CM | POA: Diagnosis not present

## 2017-03-07 DIAGNOSIS — C3412 Malignant neoplasm of upper lobe, left bronchus or lung: Secondary | ICD-10-CM | POA: Diagnosis not present

## 2017-03-07 DIAGNOSIS — E785 Hyperlipidemia, unspecified: Secondary | ICD-10-CM | POA: Diagnosis not present

## 2017-03-07 DIAGNOSIS — J449 Chronic obstructive pulmonary disease, unspecified: Secondary | ICD-10-CM | POA: Diagnosis not present

## 2017-03-07 LAB — COMPREHENSIVE METABOLIC PANEL
ALBUMIN: 3.1 g/dL — AB (ref 3.5–5.0)
ALK PHOS: 99 U/L (ref 40–150)
ALT: 12 U/L (ref 0–55)
AST: 12 U/L (ref 5–34)
Anion Gap: 10 mEq/L (ref 3–11)
BILIRUBIN TOTAL: 0.41 mg/dL (ref 0.20–1.20)
BUN: 14.8 mg/dL (ref 7.0–26.0)
CALCIUM: 9 mg/dL (ref 8.4–10.4)
CO2: 25 mEq/L (ref 22–29)
CREATININE: 0.7 mg/dL (ref 0.6–1.1)
Chloride: 106 mEq/L (ref 98–109)
EGFR: 88 mL/min/{1.73_m2} — ABNORMAL LOW (ref >90)
Glucose: 101 mg/dl (ref 70–140)
Potassium: 3.7 mEq/L (ref 3.5–5.1)
Sodium: 141 mEq/L (ref 136–145)
Total Protein: 6.9 g/dL (ref 6.4–8.3)

## 2017-03-07 LAB — CBC WITH DIFFERENTIAL/PLATELET
BASO%: 0.7 % (ref 0.0–2.0)
BASOS ABS: 0 10*3/uL (ref 0.0–0.1)
EOS%: 1.5 % (ref 0.0–7.0)
Eosinophils Absolute: 0 10*3/uL (ref 0.0–0.5)
HEMATOCRIT: 30.8 % — AB (ref 34.8–46.6)
HEMOGLOBIN: 10 g/dL — AB (ref 11.6–15.9)
LYMPH#: 0.4 10*3/uL — AB (ref 0.9–3.3)
LYMPH%: 13.6 % — ABNORMAL LOW (ref 14.0–49.7)
MCH: 27.7 pg (ref 25.1–34.0)
MCHC: 32.5 g/dL (ref 31.5–36.0)
MCV: 85.3 fL (ref 79.5–101.0)
MONO#: 0.3 10*3/uL (ref 0.1–0.9)
MONO%: 11.8 % (ref 0.0–14.0)
NEUT#: 2 10*3/uL (ref 1.5–6.5)
NEUT%: 72.4 % (ref 38.4–76.8)
Platelets: 290 10*3/uL (ref 145–400)
RBC: 3.61 10*6/uL — ABNORMAL LOW (ref 3.70–5.45)
RDW: 15.5 % — AB (ref 11.2–14.5)
WBC: 2.7 10*3/uL — ABNORMAL LOW (ref 3.9–10.3)

## 2017-03-07 MED ORDER — PACLITAXEL CHEMO INJECTION 300 MG/50ML
45.0000 mg/m2 | Freq: Once | INTRAVENOUS | Status: AC
  Administered 2017-03-07: 78 mg via INTRAVENOUS
  Filled 2017-03-07: qty 13

## 2017-03-07 MED ORDER — CARBOPLATIN CHEMO INJECTION 450 MG/45ML
212.4000 mg | Freq: Once | INTRAVENOUS | Status: AC
  Administered 2017-03-07: 210 mg via INTRAVENOUS
  Filled 2017-03-07: qty 21

## 2017-03-07 MED ORDER — FAMOTIDINE IN NACL 20-0.9 MG/50ML-% IV SOLN
INTRAVENOUS | Status: AC
  Filled 2017-03-07: qty 50

## 2017-03-07 MED ORDER — SODIUM CHLORIDE 0.9% FLUSH
10.0000 mL | INTRAVENOUS | Status: DC | PRN
  Administered 2017-03-07: 10 mL
  Filled 2017-03-07: qty 10

## 2017-03-07 MED ORDER — PALONOSETRON HCL INJECTION 0.25 MG/5ML
INTRAVENOUS | Status: AC
  Filled 2017-03-07: qty 5

## 2017-03-07 MED ORDER — DIPHENHYDRAMINE HCL 50 MG/ML IJ SOLN
50.0000 mg | Freq: Once | INTRAMUSCULAR | Status: AC
  Administered 2017-03-07: 50 mg via INTRAVENOUS

## 2017-03-07 MED ORDER — SODIUM CHLORIDE 0.9 % IV SOLN
20.0000 mg | Freq: Once | INTRAVENOUS | Status: AC
  Administered 2017-03-07: 20 mg via INTRAVENOUS
  Filled 2017-03-07: qty 2

## 2017-03-07 MED ORDER — PALONOSETRON HCL INJECTION 0.25 MG/5ML
0.2500 mg | Freq: Once | INTRAVENOUS | Status: AC
  Administered 2017-03-07: 0.25 mg via INTRAVENOUS

## 2017-03-07 MED ORDER — SODIUM CHLORIDE 0.9 % IV SOLN
Freq: Once | INTRAVENOUS | Status: AC
  Administered 2017-03-07: 14:00:00 via INTRAVENOUS

## 2017-03-07 MED ORDER — DIPHENHYDRAMINE HCL 50 MG/ML IJ SOLN
INTRAMUSCULAR | Status: AC
  Filled 2017-03-07: qty 1

## 2017-03-07 MED ORDER — HEPARIN SOD (PORK) LOCK FLUSH 100 UNIT/ML IV SOLN
500.0000 [IU] | Freq: Once | INTRAVENOUS | Status: AC | PRN
  Administered 2017-03-07: 500 [IU]
  Filled 2017-03-07: qty 5

## 2017-03-07 MED ORDER — FAMOTIDINE IN NACL 20-0.9 MG/50ML-% IV SOLN
20.0000 mg | Freq: Once | INTRAVENOUS | Status: AC
  Administered 2017-03-07: 20 mg via INTRAVENOUS

## 2017-03-07 NOTE — Telephone Encounter (Signed)
LM on VM that results were back and I was calling on behalf of Mal Misty.  Asked that she CB and I left CB number of [phone].

## 2017-03-07 NOTE — Patient Instructions (Signed)
[facility] Discharge Instructions for Patients Receiving Chemotherapy  Today you received the following chemotherapy agents Taxol and Carboplatin. To help prevent nausea and vomiting after your treatment, we encourage you to take your nausea medication as directed.  If you develop nausea and vomiting that is not controlled by your nausea medication, call the clinic.   BELOW ARE SYMPTOMS THAT SHOULD BE REPORTED IMMEDIATELY:  *FEVER GREATER THAN 100.5 F  *CHILLS WITH OR WITHOUT FEVER  NAUSEA AND VOMITING THAT IS NOT CONTROLLED WITH YOUR NAUSEA MEDICATION  *UNUSUAL SHORTNESS OF BREATH  *UNUSUAL BRUISING OR BLEEDING  TENDERNESS IN MOUTH AND THROAT WITH OR WITHOUT PRESENCE OF ULCERS  *URINARY PROBLEMS  *BOWEL PROBLEMS  UNUSUAL RASH Items with * indicate a potential emergency and should be followed up as soon as possible.  Feel free to call the clinic you have any questions or concerns. The clinic phone number is [phone].  Please show the CHEMO ALERT CARD at check-in to the Emergency Department and triage nurse.    

## 2017-03-07 NOTE — Telephone Encounter (Signed)
-----  Message from Mal Misty sent at 02/08/2017  3:49 PM EDT ----- Regarding: Call Results Guardant360 BRCA2 variant. Route to Dr. Julien Nordmann.

## 2017-03-07 NOTE — Assessment & Plan Note (Signed)
This is a very pleasant 61 year old white female recently diagnosed with unresectable a stage IIB non-small cell lung cancer, adenocarcinoma. The patient is currently undergoing a course of concurrent chemoradiation with weekly carboplatin and paclitaxel is status post 6 cycles and has been tolerating this treatment fairly well with no significant adverse effects.  Patient was seen with Dr. Julien Nordmann. Recommend that she proceed with cycle 7 of her chemotherapy today as scheduled. The patient is scheduled to complete her radiation tomorrow.  Patient will have a restaging CT scan of the chest in 3-4 weeks. She will return 1-2 days after the scan to review the results.  For her cough, recommended that she use Delsym over-the-counter.  She was advised to call immediately if she has any concerning symptoms in the interval. The patient voices understanding of current disease status and treatment options and is in agreement with the current care plan.

## 2017-03-07 NOTE — Telephone Encounter (Signed)
Scheduled appt per 9/17 los - Gave patient AVS and calender per los.

## 2017-03-07 NOTE — Progress Notes (Signed)
[facility] Follow up:    Felicia Herrera, [name], MD [address] [place] [address]   DIAGNOSIS: Stage IIB(T2b, N1, M0) non-small cell lung cancer, adenocarcinoma presented with large left upper lobe lung mass in addition to left hilar lymphadenopathy diagnosed in April 2018. Felicia Herrera previous workup was done at [facility] in [facility]. There was insufficient material to perform the molecular studies.  GUARDANT 360 Molecular studies:BRCA2 N352f. Negative for EGFR, ALK, ROS1, BRAF mutations.  SUMMARY OF ONCOLOGIC HISTORY:  No history exists.    CURRENT THERAPY: A course of concurrent chemoradiation with weekly carboplatin for AUC of 2 and paclitaxel 45 MG/M2. First dose 01/24/2017.Status post 6 cycles.  INTERVAL HISTORY: Felicia Moorehouse61y.o. female returns for routine follow-up by herself. The patient continues to tolerate Felicia Herrera treatment fairly well. Felicia Herrera reports a cough which has been ongoing and not really worsening. The cough is nonproductive. Felicia Herrera has pain at Felicia Herrera right chest surgical site which comes and goes. Felicia Herrera has been given prescription for Percocet which Felicia Herrera has not yet filled. Felicia Herrera plans to fill this soon. Denies fevers and chills. Denies chest pain, shortness of breath at rest, and hemoptysis. Has mild dyspnea on exertion. Denies nausea, vomiting, constipation, diarrhea. The patient is here for evaluation prior to cycle #7 of Felicia Herrera chemotherapy.   Patient Active Problem List   Diagnosis Date Noted  . Stage 2 moderate COPD by GOLD classification ([facility] 02/22/2017  . Adenocarcinoma of left lung, stage 2 ([facility] 12/27/2016  . Encounter for antineoplastic chemotherapy 12/27/2016  . Goals of care, counseling/discussion 12/27/2016  . GERD (gastroesophageal reflux disease) 12/17/2016  . Hypertension, essential, benign 12/17/2016  . History of COPD 12/09/2016  . History of lung cancer 12/09/2016  . Lung cancer ([facility] 11/04/2016  .  Primary hypothyroidism 11/04/2016    has No Known Allergies.  MEDICAL HISTORY: Past Medical History:  Diagnosis Date  . Adenocarcinoma of left lung, stage 2 ([facility] 12/27/2016  . Cancer ([facility]    Lun cancer: Right Dx 2008, s/p lobectomy. Left Dx 09/2016  . COPD (chronic obstructive pulmonary disease) ([facility]   . Encounter for antineoplastic chemotherapy 12/27/2016  . Hyperlipidemia   . Hypertension   . Stroke ([facility]     SURGICAL HISTORY: Past Surgical History:  Procedure Laterality Date  . BREAST BIOPSY     several. Denies Hx of breast cancer.  . IR FLUORO GUIDE PORT INSERTION RIGHT  02/04/2017  . IR [place] VASC ACCESS RIGHT  02/04/2017  . THORACOTOMY/LOBECTOMY Right 2008  . TONSILLECTOMY  1960    SOCIAL HISTORY: Social History   Social History  . Marital status: Single    Spouse name: N/A  . Number of children: N/A  . Years of education: N/A   Occupational History  . landscaping    Social History Main Topics  . Smoking status: Former Smoker    Packs/day: 1.00    Years: 30.00    Types: Cigarettes    Quit date: 06/21/2005  . Smokeless tobacco: Never Used  . Alcohol use No  . Drug use: No  . Sexual activity: Not Currently   Other Topics Concern  . Not on file   Social History Narrative   Landscaper.    Lives alone.     FAMILY HISTORY: Family History  Problem Relation Age of Onset  . Arthritis Mother   . Lung cancer Mother 441      d.45 from cancer  . Hypertension Father   .  Arthritis Father   . Pancreatic cancer Sister 60       d.60 from cancer  . Brain cancer Brother        d.~70  . Colon cancer Brother        d.~70  . Hypertension Brother   . Mental retardation Brother   . Melanoma Brother 6  . Lung cancer Maternal Aunt 66  . Colon cancer Maternal Grandfather   . Depression Neg Hx   . Heart disease Neg Hx     Review of Systems  Constitutional: Negative.   HENT:  Negative.   Eyes: Negative.   Respiratory: Positive for cough. Negative for  hemoptysis and shortness of breath.   Cardiovascular: Negative.   Gastrointestinal: Negative.   Genitourinary: Negative.    Musculoskeletal: Negative for back pain, neck pain and neck stiffness.       Right chest surgical site pain which comes and goes.  Skin: Negative.   Neurological: Negative.   Hematological: Negative.   Psychiatric/Behavioral: Negative.      PHYSICAL EXAMINATION  ECOG PERFORMANCE STATUS: 1 - Symptomatic but completely ambulatory  Vitals:   03/07/17 1244  BP: (!) 145/63  Pulse: 61  Resp: 18  Temp: 98.4 F (36.9 C)  SpO2: 95%    Physical Exam  Constitutional: Felicia Herrera is oriented to person, place, and time and well-developed, well-nourished, and in no distress. No distress.  HENT:  Head: Normocephalic.  Mouth/Throat: Oropharynx is clear and moist. No oropharyngeal exudate.  Eyes: Conjunctivae are normal. Right eye exhibits no discharge. Left eye exhibits no discharge. No scleral icterus.  Neck: Normal range of motion. Neck supple.  Cardiovascular: Normal rate, regular rhythm, normal heart sounds and intact distal pulses.   Pulmonary/Chest: Effort normal and breath sounds normal. No respiratory distress. Felicia Herrera has no wheezes. Felicia Herrera has no rales.  Abdominal: Soft. Bowel sounds are normal. Felicia Herrera exhibits no distension and no mass. There is no tenderness.  Musculoskeletal: Normal range of motion. Felicia Herrera exhibits no edema.  Lymphadenopathy:    Felicia Herrera has no cervical adenopathy.  Neurological: Felicia Herrera is alert and oriented to person, place, and time. Felicia Herrera exhibits normal muscle tone.  Skin: Skin is warm and dry. No rash noted. Felicia Herrera is not diaphoretic. No erythema. No pallor.  Psychiatric: Mood, memory, affect and judgment normal.  Vitals reviewed.   LABORATORY DATA:  CBC    Component Value Date/Time   WBC 2.7 (L) 03/07/2017 1225   WBC 7.3 02/04/2017 1248   RBC 3.61 (L) 03/07/2017 1225   RBC 3.83 (L) 02/04/2017 1248   HGB 10.0 (L) 03/07/2017 1225   HCT 30.8 (L) 03/07/2017  1225   PLT 290 03/07/2017 1225   MCV 85.3 03/07/2017 1225   MCH 27.7 03/07/2017 1225   MCH 27.4 02/04/2017 1248   MCHC 32.5 03/07/2017 1225   MCHC 32.9 02/04/2017 1248   RDW 15.5 (H) 03/07/2017 1225   LYMPHSABS 0.4 (L) 03/07/2017 1225   MONOABS 0.3 03/07/2017 1225   EOSABS 0.0 03/07/2017 1225   BASOSABS 0.0 03/07/2017 1225    CMP     Component Value Date/Time   NA 141 03/07/2017 1225   K 3.7 03/07/2017 1225   CL 102 11/04/2016 1255   CO2 25 03/07/2017 1225   GLUCOSE 101 03/07/2017 1225   BUN 14.8 03/07/2017 1225   CREATININE 0.7 03/07/2017 1225   CALCIUM 9.0 03/07/2017 1225   PROT 6.9 03/07/2017 1225   ALBUMIN 3.1 (L) 03/07/2017 1225   AST 12 03/07/2017 1225  ALT 12 03/07/2017 1225   ALKPHOS 99 03/07/2017 1225   BILITOT 0.41 03/07/2017 1225    RADIOGRAPHIC STUDIES:  No results found.   ASSESSMENT and THERAPY PLAN:   Adenocarcinoma of left lung, stage 2 ([place]) This is a very pleasant 61 year old white female recently diagnosed with unresectable a stage IIB non-small cell lung cancer, adenocarcinoma. The patient is currently undergoing a course of concurrent chemoradiation with weekly carboplatin and paclitaxel is status post 6 cycles and has been tolerating this treatment fairly well with no significant adverse effects.  Patient was seen with Dr. Julien Nordmann. Recommend that Felicia Herrera proceed with cycle 7 of Felicia Herrera chemotherapy today as scheduled. The patient is scheduled to complete Felicia Herrera radiation tomorrow.  Patient will have a restaging CT scan of the chest in 3-4 weeks. Felicia Herrera will return 1-2 days after the scan to review the results.  For Felicia Herrera cough, recommended that Felicia Herrera use Delsym over-the-counter.  Felicia Herrera was advised to call immediately if Felicia Herrera has any concerning symptoms in the interval. The patient voices understanding of current disease status and treatment options and is in agreement with the current care plan.   Orders Placed This Encounter  Procedures  . CT CHEST W  CONTRAST    Standing Status:   Future    Standing Expiration Date:   03/07/2018    Order Specific Question:   If indicated for the ordered procedure, I authorize the administration of contrast media per Radiology protocol    Answer:   Yes    Order Specific Question:   Preferred imaging location?    Answer:   [facility]    Order Specific Question:   Radiology Contrast Protocol - do NOT remove file path    Answer:   [email or web address]    Order Specific Question:   Reason for Exam additional comments    Answer:   lung cancer. S/P Chemoradiation. Restaging.    Order Specific Question:   Is patient pregnant?    Answer:   No  . CBC with Differential/Platelet    Standing Status:   Future    Standing Expiration Date:   03/07/2018  . Comprehensive metabolic panel    Standing Status:   Future    Standing Expiration Date:   03/07/2018    All questions were answered. The patient knows to call the clinic with any problems, questions or concerns. We can certainly see the patient much sooner if necessary.  Mikey Bussing, NP 03/07/2017   ADDENDUM: Hematology/Oncology Attending: I had a face to face encounter with the patient today. I recommended Felicia Herrera care plan. This is a very pleasant 61 years old white female with unresectable a stage IIb non-small cell lung cancer and currently undergoing a course of concurrent chemoradiation with weekly carboplatin and paclitaxel. The patient has been tolerating Felicia Herrera treatment fairly well with no significant adverse effects. Felicia Herrera denied having any significant dysphagia or odynophagia. I recommended for Felicia Herrera to proceed with cycle #7 today as scheduled. Felicia Herrera is expected to complete the course of concurrent radiotherapy tomorrow. I will arrange for the patient to come back for follow-up visit in one month's for evaluation after repeating CT scan of the chest for restaging of Felicia Herrera disease. Felicia Herrera was advised to call immediately if Felicia Herrera has any  concerning symptoms in the interval. Disclaimer: This note was dictated with voice recognition software. Similar sounding words can inadvertently be transcribed and may be missed upon review. Eilleen Kempf, MD 03/07/17

## 2017-03-08 ENCOUNTER — Ambulatory Visit
Admission: RE | Admit: 2017-03-08 | Discharge: 2017-03-08 | Disposition: A | Discharge status: Home / Self Care | Payer: Medicare Other | Source: Ambulatory Visit | Attending: Radiation Oncology | Admitting: Radiation Oncology

## 2017-03-08 DIAGNOSIS — J449 Chronic obstructive pulmonary disease, unspecified: Secondary | ICD-10-CM | POA: Diagnosis not present

## 2017-03-08 DIAGNOSIS — C3412 Malignant neoplasm of upper lobe, left bronchus or lung: Secondary | ICD-10-CM | POA: Diagnosis not present

## 2017-03-08 DIAGNOSIS — C3492 Malignant neoplasm of unspecified part of left bronchus or lung: Secondary | ICD-10-CM | POA: Diagnosis not present

## 2017-03-08 DIAGNOSIS — E785 Hyperlipidemia, unspecified: Secondary | ICD-10-CM | POA: Diagnosis not present

## 2017-03-08 DIAGNOSIS — I10 Essential (primary) hypertension: Secondary | ICD-10-CM | POA: Diagnosis not present

## 2017-03-08 DIAGNOSIS — Z8673 Personal history of transient ischemic attack (TIA), and cerebral infarction without residual deficits: Secondary | ICD-10-CM | POA: Diagnosis not present

## 2017-03-08 DIAGNOSIS — Z87891 Personal history of nicotine dependence: Secondary | ICD-10-CM | POA: Diagnosis not present

## 2017-03-09 ENCOUNTER — Encounter: Payer: Self-pay | Admitting: *Deleted

## 2017-03-09 ENCOUNTER — Encounter: Payer: Self-pay | Admitting: Genetics

## 2017-03-09 ENCOUNTER — Encounter: Payer: Self-pay | Admitting: Radiation Oncology

## 2017-03-09 ENCOUNTER — Other Ambulatory Visit: Payer: Self-pay | Admitting: Family Medicine

## 2017-03-09 ENCOUNTER — Telehealth: Payer: Self-pay | Admitting: Genetics

## 2017-03-09 DIAGNOSIS — Z8709 Personal history of other diseases of the respiratory system: Secondary | ICD-10-CM

## 2017-03-09 DIAGNOSIS — Z1379 Encounter for other screening for genetic and chromosomal anomalies: Secondary | ICD-10-CM | POA: Insufficient documentation

## 2017-03-09 DIAGNOSIS — Z1502 Genetic susceptibility to malignant neoplasm of ovary: Secondary | ICD-10-CM | POA: Insufficient documentation

## 2017-03-09 NOTE — Telephone Encounter (Addendum)
Discussed results of genetic testing with patient.  I explained that the BRCA2 gene in which a variant was identified in her tumor testing was negative in her blood/germline sample.  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    We discussed that women with a BRIP1 mutation are at an increased risk to develop ovarian cancer (8-15% risk).  It is recommended that women with this mutation consider having a risk-reducing salpingo-oophorectomy. Ovarian screening options are also available, but not proven to be effective.     I explained that this result has implications for her family members as well.  Her siblings as well as aunts/uncles, cousins, and other relatives could also have this BRIP1 mutation and be at increased risk for ovarian cancer.    There is some evidence to suggest that there may be an increased risk for breast cancer in patients with BRIP1 mutations, however this is not well characterized and therefore no recommendations regarding breast cancer screening/management exist.  Felicia Herrera would like to meet with a genetic counselor to discuss these positive results and make a plan as to who will follow her, discuss testing relatives, etc.  This is scheduled for Oct 15 at 1pm.  I informed Felicia Herrera that if any of her relatives are interested in being tested for this mutation, they could attend this appointment as well to be tested.    I explained that we did not find a genetic mutation that explains the family history of pancreatic, lung, brain or colon cancer.  However, in the future there may be updated testing that could be helpful for her or her family.    Felicia Herrera has my contact information if she has questions between now and her upcoming genetics appointment. The results have been scanned into the medical record, but a portion of the report is provided below:

## 2017-03-09 NOTE — Telephone Encounter (Signed)
Low dose breo fine  Dr. Brand Males, M.D., [facility].C.P Pulmonary and Critical Care Medicine Staff Physician [facility] Pager: [phone], If no answer or between  15:00h - 7:00h: call [phone]  03/09/2017 3:24 PM

## 2017-03-09 NOTE — Progress Notes (Signed)
  Radiation Oncology         [id number]) [phone] ________________________________  Name: Felicia Herrera MRN: [id number]  Date: 03/09/2017  DOB: 12-Jun-1956  End of Treatment Note  Diagnosis:   Stage IIB (T2b, N1, M0) non-small cell lung cancer, adenocarcinoma      Indication for treatment:  Curative       Radiation treatment dates:   01/24/2017-03/08/2017  Site/dose:   Left lung, 2 Gy in 30 fractions  Beams/energy:   3D, 6X  Narrative: The patient tolerated radiation treatment relatively well. At the beginning of the patient treatments, she noted that she was fatigued with mild SOB. At the end of her radiation treatments, she noted that she had exertional SOB and mild radiation related skin changes that she was using radiaplex for.   Plan: The patient has completed radiation treatment. The patient will return to radiation oncology clinic for routine followup in one month. I advised them to call or return sooner if they have any questions or concerns related to their recovery or treatment.  -----------------------------------  Blair Promise, PhD, MD  This document serves as a record of services personally performed by Gery Pray, MD. It was created on her behalf by Steva Colder, a trained medical scribe. The creation of this record is based on the scribe's personal observations and the provider's statements to them. This document has been checked and approved by the attending provider.

## 2017-03-10 ENCOUNTER — Encounter: Payer: Self-pay | Admitting: Family Medicine

## 2017-03-18 ENCOUNTER — Telehealth: Payer: Self-pay | Admitting: Internal Medicine

## 2017-03-18 NOTE — Telephone Encounter (Signed)
Lvm advising appt time chgd on 10/17 from 1.45 to 1pm with [place] due to md admin time.

## 2017-03-24 ENCOUNTER — Other Ambulatory Visit: Payer: Self-pay | Admitting: Family Medicine

## 2017-03-24 DIAGNOSIS — Z8709 Personal history of other diseases of the respiratory system: Secondary | ICD-10-CM

## 2017-03-27 ENCOUNTER — Other Ambulatory Visit: Payer: Self-pay | Admitting: Family Medicine

## 2017-03-27 DIAGNOSIS — Z8709 Personal history of other diseases of the respiratory system: Secondary | ICD-10-CM

## 2017-03-28 ENCOUNTER — Telehealth: Payer: Self-pay | Admitting: Oncology

## 2017-03-28 ENCOUNTER — Telehealth: Payer: Self-pay | Admitting: Family Medicine

## 2017-03-28 DIAGNOSIS — Z8709 Personal history of other diseases of the respiratory system: Secondary | ICD-10-CM

## 2017-03-28 MED ORDER — FLUTICASONE FUROATE-VILANTEROL 200-25 MCG/INH IN AEPB: 1.0000 | INHALATION_SPRAY | Freq: Every day | RESPIRATORY_TRACT | 0 refills | Status: DC

## 2017-03-28 NOTE — Telephone Encounter (Signed)
° ° ° ° °  Pt call to ask for a refill on the below med     fluticasone furoate-vilanterol (BREO ELLIPTA) 200-25 MCG/INH AEPB  [facility]

## 2017-03-28 NOTE — Telephone Encounter (Signed)
Rx sent. Further refills NEED to come from Dr. Chase Caller, her Pulmonologist.

## 2017-03-28 NOTE — Telephone Encounter (Addendum)
Patient left a message with triage asking for a return call.  Called patient back and left a message with the direct number to radiation oncology.

## 2017-03-29 ENCOUNTER — Telehealth: Payer: Self-pay | Admitting: Internal Medicine

## 2017-03-29 ENCOUNTER — Telehealth: Payer: Self-pay | Admitting: Oncology

## 2017-03-29 DIAGNOSIS — Z8709 Personal history of other diseases of the respiratory system: Secondary | ICD-10-CM

## 2017-03-29 MED ORDER — FLUTICASONE FUROATE-VILANTEROL 200-25 MCG/INH IN AEPB: 1.0000 | INHALATION_SPRAY | Freq: Every day | RESPIRATORY_TRACT | 5 refills | Status: DC

## 2017-03-29 NOTE — Telephone Encounter (Signed)
Pt requesting refill on breo.  I advised that a rx was sent in yesterday by her PCP, and on this rx further refills would come from our office.  Pt expressed understanding.  Refills have been sent as pt was seen 1 month ago.  Nothing further needed.

## 2017-03-29 NOTE — Telephone Encounter (Signed)
Patient called and said she is trying to get her Felicia Herrera filled but was told her insurance will not cover it and that it costs $500.00.  She has been trying to get a hold of Felicia Herrera office (her pcp) but has not heard back.  Advised her to call her pulmonologist, Felicia Herrera, to see if another inhaler can be prescribed for her.  Asked if she would like to be seen sooner by Felicia Herrera and she said she will wait to see how she is feeling tomorrow and will call back.  She also mentioned that the pain in her right side is much better after she started taking Felicia Herrera 2 days ago.

## 2017-04-01 ENCOUNTER — Encounter: Payer: Self-pay | Admitting: Oncology

## 2017-04-01 ENCOUNTER — Other Ambulatory Visit: Payer: Self-pay

## 2017-04-01 MED ORDER — FLUTICASONE PROPIONATE 50 MCG/ACT NA SUSP: 1.0000 | Freq: Two times a day (BID) | NASAL | 1 refills | Status: DC

## 2017-04-04 ENCOUNTER — Encounter (HOSPITAL_COMMUNITY): Payer: Self-pay

## 2017-04-04 ENCOUNTER — Ambulatory Visit (HOSPITAL_BASED_OUTPATIENT_CLINIC_OR_DEPARTMENT_OTHER): Payer: Medicare Other | Admitting: Genetics

## 2017-04-04 ENCOUNTER — Ambulatory Visit (HOSPITAL_COMMUNITY)
Admission: RE | Admit: 2017-04-04 | Discharge: 2017-04-04 | Disposition: A | Discharge status: Home / Self Care | Payer: Medicare Other | Source: Ambulatory Visit | Attending: Oncology | Admitting: Oncology

## 2017-04-04 ENCOUNTER — Other Ambulatory Visit (HOSPITAL_BASED_OUTPATIENT_CLINIC_OR_DEPARTMENT_OTHER): Payer: Medicare Other

## 2017-04-04 ENCOUNTER — Ambulatory Visit
Admission: RE | Admit: 2017-04-04 | Discharge: 2017-04-04 | Disposition: A | Discharge status: Home / Self Care | Payer: Medicare Other | Source: Ambulatory Visit | Attending: Radiation Oncology | Admitting: Radiation Oncology

## 2017-04-04 ENCOUNTER — Encounter: Payer: Self-pay | Admitting: Genetics

## 2017-04-04 ENCOUNTER — Encounter: Payer: Self-pay | Admitting: Radiation Oncology

## 2017-04-04 DIAGNOSIS — Z1502 Genetic susceptibility to malignant neoplasm of ovary: Secondary | ICD-10-CM | POA: Diagnosis not present

## 2017-04-04 DIAGNOSIS — Z7982 Long term (current) use of aspirin: Secondary | ICD-10-CM | POA: Diagnosis not present

## 2017-04-04 DIAGNOSIS — C3492 Malignant neoplasm of unspecified part of left bronchus or lung: Secondary | ICD-10-CM | POA: Insufficient documentation

## 2017-04-04 DIAGNOSIS — Z1379 Encounter for other screening for genetic and chromosomal anomalies: Secondary | ICD-10-CM

## 2017-04-04 DIAGNOSIS — R911 Solitary pulmonary nodule: Secondary | ICD-10-CM | POA: Diagnosis not present

## 2017-04-04 DIAGNOSIS — Z79899 Other long term (current) drug therapy: Secondary | ICD-10-CM | POA: Diagnosis not present

## 2017-04-04 DIAGNOSIS — Z902 Acquired absence of lung [part of]: Secondary | ICD-10-CM | POA: Insufficient documentation

## 2017-04-04 DIAGNOSIS — Z9889 Other specified postprocedural states: Secondary | ICD-10-CM | POA: Insufficient documentation

## 2017-04-04 DIAGNOSIS — Z7183 Encounter for nonprocreative genetic counseling: Secondary | ICD-10-CM

## 2017-04-04 DIAGNOSIS — Z809 Family history of malignant neoplasm, unspecified: Secondary | ICD-10-CM | POA: Diagnosis not present

## 2017-04-04 DIAGNOSIS — J439 Emphysema, unspecified: Secondary | ICD-10-CM | POA: Insufficient documentation

## 2017-04-04 DIAGNOSIS — Z9689 Presence of other specified functional implants: Secondary | ICD-10-CM | POA: Diagnosis not present

## 2017-04-04 DIAGNOSIS — C3412 Malignant neoplasm of upper lobe, left bronchus or lung: Secondary | ICD-10-CM | POA: Diagnosis present

## 2017-04-04 LAB — COMPREHENSIVE METABOLIC PANEL
ALT: 12 U/L (ref 0–55)
AST: 15 U/L (ref 5–34)
Albumin: 3.1 g/dL — ABNORMAL LOW (ref 3.5–5.0)
Alkaline Phosphatase: 95 U/L (ref 40–150)
Anion Gap: 11 mEq/L (ref 3–11)
BILIRUBIN TOTAL: 0.33 mg/dL (ref 0.20–1.20)
BUN: 10.1 mg/dL (ref 7.0–26.0)
CHLORIDE: 104 meq/L (ref 98–109)
CO2: 27 mEq/L (ref 22–29)
CREATININE: 0.7 mg/dL (ref 0.6–1.1)
Calcium: 8.9 mg/dL (ref 8.4–10.4)
EGFR: >60 mL/min/{1.73_m2} (ref >60)
GLUCOSE: 123 mg/dL (ref 70–140)
Potassium: 3.6 mEq/L (ref 3.5–5.1)
SODIUM: 142 meq/L (ref 136–145)
TOTAL PROTEIN: 7.1 g/dL (ref 6.4–8.3)

## 2017-04-04 LAB — CBC WITH DIFFERENTIAL/PLATELET
BASO%: 1 % (ref 0.0–2.0)
Basophils Absolute: 0.1 10*3/uL (ref 0.0–0.1)
EOS%: 1.1 % (ref 0.0–7.0)
Eosinophils Absolute: 0.1 10*3/uL (ref 0.0–0.5)
HCT: 31.7 % — ABNORMAL LOW (ref 34.8–46.6)
HGB: 10.6 g/dL — ABNORMAL LOW (ref 11.6–15.9)
LYMPH%: 7.5 % — AB (ref 14.0–49.7)
MCH: 29.4 pg (ref 25.1–34.0)
MCHC: 33.5 g/dL (ref 31.5–36.0)
MCV: 87.8 fL (ref 79.5–101.0)
MONO#: 0.5 10*3/uL (ref 0.1–0.9)
MONO%: 8.8 % (ref 0.0–14.0)
NEUT%: 81.6 % — ABNORMAL HIGH (ref 38.4–76.8)
NEUTROS ABS: 4.8 10*3/uL (ref 1.5–6.5)
PLATELETS: 475 10*3/uL — AB (ref 145–400)
RBC: 3.61 10*6/uL — ABNORMAL LOW (ref 3.70–5.45)
RDW: 22.9 % — ABNORMAL HIGH (ref 11.2–14.5)
WBC: 5.9 10*3/uL (ref 3.9–10.3)
lymph#: 0.4 10*3/uL — ABNORMAL LOW (ref 0.9–3.3)

## 2017-04-04 MED ORDER — IOPAMIDOL (ISOVUE-300) INJECTION 61%
INTRAVENOUS | Status: AC
  Filled 2017-04-04: qty 75

## 2017-04-04 MED ORDER — IOPAMIDOL (ISOVUE-300) INJECTION 61%
75.0000 mL | Freq: Once | INTRAVENOUS | Status: AC | PRN
  Administered 2017-04-04: 75 mL via INTRAVENOUS

## 2017-04-04 MED ORDER — OXYCODONE-ACETAMINOPHEN 5-325 MG PO TABS: 1.0000 | ORAL_TABLET | ORAL | 0 refills | Status: DC | PRN

## 2017-04-04 NOTE — Progress Notes (Signed)
HPI: Felicia Herrera was previously seen in the [place] clinic on 02/28/2017 due to a personal and family history of cancer and concerns regarding a hereditary predisposition to cancer. Please refer to our prior cancer genetics clinic note for more information regarding Felicia Herrera's medical, social and family histories, and our assessment and recommendations, at the time. Felicia Herrera recent genetic test results were disclosed to her, as well as recommendations warranted by these results. These results and recommendations are discussed in more detail below.  CANCER HISTORY:    Adenocarcinoma of left lung, stage 2 ([facility])   12/27/2016 Initial Diagnosis    Adenocarcinoma of left lung, stage 2 ([facility])     02/28/2017 Genetic Testing    Patient had genetic testing due to a BRCA2, N3434f variant being identified on her [employer] testing.  She has a family history of pancreatic, lung, colon and brain cancer.  The Multi-Cancer Panel was ordered.  The Multi-Cancer Panel offered by [facility] includes sequencing and/or deletion duplication testing of the following 83 genes: ALK, APC, ATM, AXIN2,BAP1,  BARD1, BLM, BMPR1A, BRCA1, BRCA2, BRIP1, CASR, CDC73, CDH1, CDK4, CDKN1B, CDKN1C, CDKN2A (p14ARF), CDKN2A (p16INK4a), CEBPA, CHEK2, CTNNA1, DICER1, DIS3L2, EGFR (c.2369C>T, p.Thr790Met variant only), EPCAM (Deletion/duplication testing only), FH, FLCN, GATA2, GPC3, GREM1 (Promoter region deletion/duplication testing only), HOXB13 (c.251G>A, p.Gly84Glu), HRAS, KIT, MAX, MEN1, MET, MITF (c.952G>A, p.Glu318Lys variant only), MLH1, MSH2, MSH3, MSH6, MUTYH, NBN, NF1, NF2, NTHL1, PALB2, PDGFRA, PHOX2B, PMS2, POLD1, POLE, POT1, PRKAR1A, PTCH1, PTEN, RAD50, RAD51C, RAD51D, RB1, RECQL4, RET, RUNX1, SDHAF2, SDHA (sequence changes only), SDHB, SDHC, SDHD, SMAD4, SMARCA4, SMARCB1, SMARCE1, STK11, SUFU, TERC, TERT, TMEM127, TP53, TSC1, TSC2, VHL, WRN and WT1.   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   Germline analysis of the BRCA2 gene was negative.   The date of this test report is 02/28/2017.          FAMILY HISTORY:  We obtained a detailed, 4-generation family history.  Significant diagnoses are listed below: Family History  Problem Relation Age of Onset  . Arthritis Mother   . Lung cancer Mother 464      d.45 from cancer  . Hypertension Father   . Arthritis Father   . Pancreatic cancer Sister 60       d.60 from cancer  . Brain cancer Brother        d.~70  . Colon cancer Brother        d.~70  . Hypertension Brother   . Mental retardation Brother   . Melanoma Brother 438 . Lung cancer Maternal Aunt 66  . Colon cancer Maternal Grandfather   . Depression Neg Hx   . Heart disease Neg Hx    Ms. MWishamhas no children. She has three full brothers, one full sister, and three paternal half brothers. Her sister, SKatharine Look was diagnosed with and died from pancreatic cancer at age 351 A full brother, TKarrie Doffing had melanoma at age 6787and is doing well at age 60 Another full brother, TCoralyn Mark died at 62without cancers. The third full brother, TChristia Reading is 544without cancers. Felicia Herrera's paternal half brother, AMilus Banister died around the age of 728after having brain cancer twice. Another paternal half brother, LRolla Plate died around the age of 794with a history of colon cancer. The third paternal half brother, BDaiva Nakayama is in his mid-70s without cancer.  Ms. MNissleymother was diagnosed  with lung cancer at 64 and died at age 58 from the disease. Ms. Hor mother had one full sister and one full brother. She also had two paternal half sisters. The full sister and brother both died in their 72s without known cancers. Both half sisters are living. One has lung cancer at age 33. Ms. Landen maternal grandfather died when she was 66 and had colon cancer twice. Her maternal grandmother is deceased  with no further details.  Ms. Leet's father died at 12 without cancers. Ms. Mena knows very little about her paternal family history. Her father had 11 siblings, but there is no further information about them available. Ms. Gryder paternal grandparents died in their late-80s without cancers.  Ms. Decook is unaware of previous family history of genetic testing for hereditary cancer risks. Patient's maternal ancestors are of Irish/French descent, and paternal ancestors are of English/German descent. There is no reported Ashkenazi Jewish ancestry. There is no known consanguinity.   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  Mutations in this gene are associated with an increased risk for ovarian cancer.   The Multi-Cancer Panel offered by [facility] includes sequencing and/or deletion duplication testing of the following 83 genes: ALK, APC, ATM, AXIN2,BAP1,  BARD1, BLM, BMPR1A, BRCA1, BRCA2, BRIP1, CASR, CDC73, CDH1, CDK4, CDKN1B, CDKN1C, CDKN2A (p14ARF), CDKN2A (p16INK4a), CEBPA, CHEK2, CTNNA1, DICER1, DIS3L2, EGFR (c.2369C>T, p.Thr790Met variant only), EPCAM (Deletion/duplication testing only), FH, FLCN, GATA2, GPC3, GREM1 (Promoter region deletion/duplication testing only), HOXB13 (c.251G>A, p.Gly84Glu), HRAS, KIT, MAX, MEN1, MET, MITF (c.952G>A, p.Glu318Lys variant only), MLH1, MSH2, MSH3, MSH6, MUTYH, NBN, NF1, NF2, NTHL1, PALB2, PDGFRA, PHOX2B, PMS2, POLD1, POLE, POT1, PRKAR1A, PTCH1, PTEN, RAD50, RAD51C, RAD51D, RB1, RECQL4, RET, RUNX1, SDHAF2, SDHA (sequence changes only), SDHB, SDHC, SDHD, SMAD4, SMARCA4, SMARCB1, SMARCE1, STK11, SUFU, TERC, TERT, TMEM127, TP53, TSC1, TSC2, VHL, WRN and WT1. .  A variant of uncertain significance (VUS) in a gene called MET was also noted.c.3290A>G (p.His1097Arg)   *Note-Felicia Herrera's BRCA2 genetic testing was  normal/negative.  This indicates that the variant identified in her tumor testing was not identified in her germline sample or was not considered a VUS or pathogenic variant by the laboratory, [facility].  The test report will be scanned into EPIC and will be located under the Molecular Pathology section of the Results Review tab.A portion of the result report is included below for reference.      We discussed with Felicia Herrera that because current genetic testing is not perfect, it is possible there may be a gene mutation in one of these genes that current testing cannot detect, but that chance is small. We also discussed, that there could be another gene that has not yet been discovered, or that we have not yet tested, that is responsible for the cancer diagnoses in the family. Therefore, it is important to remain in touch with cancer genetics in the future so that we can continue to offer Felicia Herrera the most up to date genetic testing.   Regarding the VUS in MET: At this time, it is unknown if this variant is associated with increased cancer risk or if this is a normal finding, but most variants such as this get reclassified to being inconsequential. It should not be used to make medical management decisions. With time, we suspect the lab will determine the significance of this variant, if any. If we do learn more about it, we  will try to contact Felicia Herrera to discuss it further. However, it is important to stay in touch with [place] periodically and keep the address and phone number up to date.  Clinical condition- BRIP1 Women who are carriers of a single pathogenic BRIP1 variant have an increased risk ovarian and possibly breast cancer.  Studies suggest the risk of ovarian cancer is approximately 8% and may be up to 15% (PMID: 63875643, 32951884, 16606301, 60109323). An individual with a BRIP1 pathogenic variant will not necessarily develop cancer in their lifetime, but the risk for cancer is  increased over the general population risk. There is  insufficient evidence to establish the breast cancer risk associated with this gene.    BRIP1 also has preliminary evidence of an association with hematologic malignancies. The meaning of preliminary-evidence genes is that association between the gene and the specific condition has not been completely established. This uncertainty may be resolved as new information becomes available, and therefore clinicians may continue to order these preliminary-evidence genes.  Management The [place] (NCCN) recommends consideration of prophylactic salpingo-oophorectomy (surgical removal of the ovaries and fallopian tubes) for women with a pathogenic variant in [place] after childbearing is complete Naval architect. Breast and Ovarian Management Based on Genetic Test Results. Version 2.2016). While based on limited data, it is recommended that women have a BSO between the ages of 53-50 or earlier depending on family history.    Women electing to defer prophylactic oophorectomy can consider screening for serum CA-125 and transvaginal ultrasound; however, data do not support such screening and it should not be a substitute for preventive surgery. The current NCCN guidelines do not recommend additional breast cancer screening for individuals with a single pathogenic BRIP1 variant beyond what is recommended for the general population. However, they caution that cancer screening should ultimately be guided by personal and family history (Artist. Breast and Ovarian Management Based on Genetic Test Results. Version 2.2016).   Felicia Herrera would like to discuss the prospect of a prophylactic BSO within the context of her current cancer treatment with her oncologist, Dr. Ivin Poot.   An individual's cancer risk and medical management are not determined by genetic test results alone. Overall cancer  risk assessment incorporates additional factors, including personal medical history, family history, and any available genetic information that may result in a personalized plan for cancer prevention and surveillance.  Even though data regarding BRIP1 is preliminary, knowing if a pathogenic variant is present is advantageous. At-risk relatives can be identified, enabling pursuit of a diagnostic evaluation. Further, the available information regarding hereditary cancer susceptibility genes is constantly evolving and more clinically relevant data regarding BRIP1 are likely to become available in the near future. Awareness of this cancer predisposition encourages patients and their providers to inform at-risk family members, to diligently follow standard screening protocols, and to be vigilant in maintaining close and regular contact with their local genetics clinic in anticipation of new information.  Inheritance Hereditary predisposition to cancer due to a single pathogenic variant in the BRIP1 gene has autosomal dominant inheritance. This means that an individual with a pathogenic variant has a 50% chance of passing the condition on to their offspring. Once such a variant is detected in an individual, it is possible to identify at-risk relatives who can pursue testing for this specific familial variant. Many cases are inherited from a parent, but some cases can occur spontaneously (i.e., an individual with a pathogenic variant has parents who do not  have it).   Additionally, individuals with a pathogenic variant in BRIP1 are carriers of Fanconi anemia type J. Fanconi anemia is an autosomal recessive disorder that is characterized by bone marrow failure and variable presentation of anomalies, including short stature, abnormal skin pigmentation, abnormal thumbs, malformations of the skeletal and central nervous systems, and developmental delay (PMID: 1610960, 45409811). Risks for leukemia and early onset solid  tumors are significantly elevated (PMID: 91478295, 62130865, 78469629). For there to be a risk of Fanconi anemia in offspring, both parents would each have to have a single pathogenic variant in [place]; in such a case, the risk of having an affected child is 25%.  Family Members We recommended that Felicia Herrera's siblings all have genetic testing for this BRIP1 mutation.  There is a 50% chance that each of her full siblings also inherited this mutation.  Felicia Herrera nieces and nephews are also recommended to have genetic testing if their parent is deceased or declines genetic testing.   At this time we do not know if this mutation came from the maternal or paternal side of the family.  Therefore we recommend all relatives on both sides of the family have genetic testing for this mutation.  The laboratory offers free genetic testing for this familial mutation to all relatives for 90 days from the date of her test report.  Relatives insurance will likely cover this testing outside of this time window.  We will write a family letter to assist Felicia Herrera in informing her relatives.    Individuals in this family might be at some increased risk of developing cancer, over the general population risk, even if they are negative for BRIP1, simply due to the family history of cancer. We recommended women in this family have a yearly mammogram beginning at age 43, or 83 years younger than the earliest onset of cancer, an annual clinical breast exam, and perform monthly breast self-exams. Women in this family should also have a gynecological exam as recommended by their primary provider. All family members should have a colonoscopy by age 76.  DNA Banking Felicia Herrera expressed interest in saving DNA for the future, as we anticipate learning more about cancer genetics in the future.  I provided her with a brochure from [facility] about their DNA banking services.  She will review this information and let  us know if she has any questions or needs assistance with that process.   FOLLOW-UP: Lastly, we discussed with Felicia Herrera that cancer genetics is a rapidly advancing field and it is possible that new genetic tests will be appropriate for her and/or her family members in the future. We encouraged her to remain in contact with cancer genetics on an annual basis so we can update her personal and family histories and let her know of advances in cancer genetics that may benefit this family.   Felicia Herrera was provided a copy of her genetic test results and a fact sheet with information about BRIP1 gene mutations (written by the laboratory counsyl).   Our contact number was provided. Felicia Herrera questions were answered to her satisfaction, and she knows she is welcome to call us at anytime with additional questions or concerns.   Ferol Luz, MS Genetic Counselor Ria Comment.[email or web address]  The patient was seen for a total of 30 minutes of face-to-face genetic counseling.

## 2017-04-04 NOTE — Progress Notes (Signed)
Felicia Herrera is here for follow up after treatment to her left lung.  She continues to have pain in her right side.  She has been taking percocet 3 times a day and does need a refill.  She reports having shortness of breath with the pain in her right side.  She reports having a frequent cough with yellow sputum.  She said it is keeping her up at night.  She has not been taking anything for the cough.  She denies having a sore throat or trouble swallowing.  She reports having fatigue and is taking 2-3 hour naps in the afternoon.  The skin on her left chest is pink and her left upper back has hyperpigmentation.  She will have a CT scan today.  BP 117/62 (BP Location: Left Arm, Patient Position: Sitting)   Pulse (!) 101   Temp 99.4 F (37.4 C) (Oral)   Ht 5\' 2"  (1.575 m)   Wt 146 lb 12.8 oz (66.6 kg)   SpO2 98%   BMI 26.85 kg/m    Wt Readings from Last 3 Encounters:  04/04/17 146 lb 12.8 oz (66.6 kg)  03/07/17 147 lb 8 oz (66.9 kg)  02/23/17 148 lb 6.4 oz (67.3 kg)

## 2017-04-04 NOTE — Progress Notes (Signed)
Radiation Oncology         [phone] ________________________________  Name: Felicia Herrera MRN: [id number]  Date: 04/04/2017  DOB: 20-Feb-1956  Follow-Up Visit Note  CC: [name], [name], MD  [name], [name], MD    ICD-10-CM   1. Adenocarcinoma of left lung, stage 2 ([facility]) C34.92     Diagnosis:   Stage IIB (T2b, N1, M0) non-small cell lung cancer adenocarcinoma   Interval Since Last Radiation:  1 months  Narrative:  The patient returns today for routine follow-up. She still reports some pain along the right lateral chest beginning approximately one month ago, but she voices that this has mainly remained unchanged since onset. She continues to take percocet TID and she states that this is the only thing that helps her sleep at night. She does report and ongoing cough, but this has also remained unchanged and was a baseline issue prior to beginning her radiation treatments. The pain along her right side is worse with her cough. She is otherwise without acute complaint. Of note, she has a CT Chest scheduled following this appointment and f/u w/ medical oncology in two days.                               ALLERGIES:  has No Known Allergies.  Meds: Current Outpatient Prescriptions  Medication Sig Dispense Refill  . aspirin EC 81 MG tablet Take 81 mg by mouth daily.    Marland Kitchen atorvastatin (LIPITOR) 40 MG tablet Take 40 mg by mouth daily.    Marland Kitchen Dextromethorphan HBr (COUGH SUPPRESSANT PO) Take by mouth.    . fluticasone (FLONASE) 50 MCG/ACT nasal spray Place 1 spray into both nostrils 2 (two) times daily. 16 g 1  . fluticasone furoate-vilanterol (BREO ELLIPTA) 200-25 MCG/INH AEPB Inhale 1 puff into the lungs daily. 60 each 5  . levothyroxine (SYNTHROID, LEVOTHROID) 75 MCG tablet Take 75 mcg by mouth daily before breakfast.    . omeprazole (PRILOSEC) 20 MG capsule Take 20 mg by mouth daily.    Marland Kitchen oxyCODONE-acetaminophen (PERCOCET/ROXICET) 5-325 MG tablet Take 1 tablet by mouth every 4 (four)  hours as needed for severe pain. 90 tablet 0  . prochlorperazine (COMPAZINE) 10 MG tablet Take 1 tablet (10 mg total) by mouth every 6 (six) hours as needed for nausea or vomiting. 30 tablet 0  . lidocaine-prilocaine (EMLA) cream Apply 1 application topically as needed. (Patient not taking: Reported on 03/07/2017) 30 g 0   No current facility-administered medications for this encounter.    Facility-Administered Medications Ordered in Other Encounters  Medication Dose Route Frequency Provider Last Rate Last Dose  . iopamidol (ISOVUE-300) 61 % injection             Physical Findings: The patient is in no acute distress. Patient is alert and oriented.  height is 5\' 2"  (1.575 m) and weight is 146 lb 12.8 oz (66.6 kg). Her oral temperature is 99.4 F (37.4 C). Her blood pressure is 117/62 and her pulse is 101 (abnormal). Her oxygen saturation is 98%. .  No significant changes.  Heart has regular rate and rhythm. No palpable cervical, supraclavicular, or axillary adenopathy. Abdomen soft, non-tender, normal bowel sounds. Chest is clear to auscultation. Pt has some tenderness to palpation along right thoracotomy scar. Her fungal infection in the right chest region has cleared up.   Lab Findings: Lab Results  Component Value Date   WBC 5.9 04/04/2017  HGB 10.6 (L) 04/04/2017   HCT 31.7 (L) 04/04/2017   MCV 87.8 04/04/2017   PLT 475 (H) 04/04/2017    Radiographic Findings: Ct Chest W Contrast  Result Date: 04/04/2017 CLINICAL DATA:  Right thoracotomy and lobectomy in2008. Left lung cancer diagnosed in July 2018. EXAM: CT CHEST WITH CONTRAST TECHNIQUE: Multidetector CT imaging of the chest was performed during intravenous contrast administration. CONTRAST:  45mL ISOVUE-300 IOPAMIDOL (ISOVUE-300) INJECTION 61% COMPARISON:  08/30/2016 CT scan from [place]: Cardiovascular: Distortion of cardiomediastinal anatomy due to right pneumonectomy. No pericardial effusion. No  thoracic aortic aneurysm. Right IJ central line tip is in the distal SVC. Mediastinum/Nodes: Small mediastinal lymph nodes are evident without mediastinal lymphadenopathy. No left hilar lymphadenopathy. A tracheal stent is evident with some collapse of the stent noted along the cranial margin. Distal end of the stent is in the carina. Lungs/Pleura: Marked hyperexpansion of the left lung with centrilobular emphysema evident. Irregular left upper lobe mass measures 4.0 x 1.8 cm today. This compares to 4.7 x 3.1 cm when I remeasure it on the prior study. 4 mm left upper lobe nodule seen image 63 series 5 is stable. No pulmonary edema or pleural effusion. Upper Abdomen: Asymmetric elevation right hemidiaphragm. No suspicious adrenal nodule or mass. Musculoskeletal: Bone windows reveal no worrisome lytic or sclerotic osseous lesions. IMPRESSION: 1. Interval decrease in size of the central left upper lobe pulmonary mass. 2. Stable 4 mm central left upper lobe pulmonary nodule. 3. Status post right pneumonectomy. 4. Stable position of the tracheal stent with some collapse along the cranial margin of the stent device. 5.  Emphysema. ([id number]) Electronically Signed   By: Misty Stanley M.D.   On: 04/04/2017 15:56    Impression:  The patient is recovering from the effects of radiation. No clinic evidence of recurrence on exam. She proceed with a CT Chest later his afternoon and f/u w/ medical oncology later this week.   Plan:  Routine f/u in radiation oncology in 29mo.   ____________________________________ -----------------------------------  Blair Promise, PhD, MD  This document serves as a record of services personally performed by Gery Pray, MD. It was created on his behalf by Reola Mosher, a trained medical scribe. The creation of this record is based on the scribe's personal observations and the provider's statements to them. This document has been checked and approved by the attending  provider.

## 2017-04-06 ENCOUNTER — Ambulatory Visit (HOSPITAL_BASED_OUTPATIENT_CLINIC_OR_DEPARTMENT_OTHER): Payer: Medicare Other | Admitting: Oncology

## 2017-04-06 ENCOUNTER — Encounter: Payer: Self-pay | Admitting: Oncology

## 2017-04-06 VITALS — BP 159/66 | HR 109 | Temp 98.6°F | Resp 18 | Ht 62.0 in | Wt 148.0 lb

## 2017-04-06 DIAGNOSIS — C3492 Malignant neoplasm of unspecified part of left bronchus or lung: Secondary | ICD-10-CM

## 2017-04-06 DIAGNOSIS — R5383 Other fatigue: Secondary | ICD-10-CM | POA: Diagnosis not present

## 2017-04-06 DIAGNOSIS — Z1502 Genetic susceptibility to malignant neoplasm of ovary: Secondary | ICD-10-CM

## 2017-04-06 DIAGNOSIS — Z5112 Encounter for antineoplastic immunotherapy: Secondary | ICD-10-CM

## 2017-04-06 MED ORDER — GABAPENTIN 300 MG PO CAPS: 300.0000 mg | ORAL_CAPSULE | Freq: Three times a day (TID) | ORAL | 0 refills | Status: DC

## 2017-04-06 NOTE — Assessment & Plan Note (Signed)
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 The patient should be considered for prophylactic salpingo-oophorectomy. We will discuss this further with the patient at her next visit.

## 2017-04-06 NOTE — Assessment & Plan Note (Signed)
This is a very pleasant 61 year old white female recently diagnosed with unresectable a stage IIB non-small cell lung cancer, adenocarcinoma. The patient is currently undergoing a course of concurrent chemoradiation with weekly carboplatin and paclitaxel is status post 6cycles and has been tolerating this treatment fairly well with no significant adverse effects.  Patient was seen with Dr. Julien Nordmann. CT scan images were reviewed. There is no obvious abnormality on her right side at the site of the pain. Results were discussed with the patient. Explained to the patient that her pain may be neuropathic due to her prior surgery. Discussed with the patient options for management of her condition at this point including close observation and monitoring versus consolidation treatment with immunotherapy with Imfinzi (Durvalumab) 10 MG/KG every 2 weeks for a total of one year unless the patient developed toxicity or disease progression. The patient is interested in the treatment with consolidation Imfinzi (Durvalumab). Discussed the adverse effect of this treatment including but not limited to immunotherapy mediated skin rash, diarrhea, or inflammation of the lung, kidney, liver, thyroid or other endocrine dysfunction including type 1 diabetes mellitus.  The patient would like to proceed with treatment as planned and she is expected to start the first cycle next week.  For her neuropathic pain, I have prescribed gabapentin 300 mg 3 times a day. We also advised the patient to get a lidocaine patch to apply to the painful site which can be obtained over-the-counter.  For her cough, recommended that she use Delsym over-the-counter.  The patient will be seen back for follow-up in approximately 3 weeks for evaluation prior to cycle 2 of immunotherapy.  She was advised to call immediately if she has any concerning symptoms in the interval. The patient voices understanding of current disease status and treatment  options and is in agreement with the current care plan.

## 2017-04-06 NOTE — Progress Notes (Signed)
[facility] Follow up:    Felicia Herrera, Felicia Herrera G, MD [address] [place] [address]   DIAGNOSIS: Stage IIB(T2b, N1, M0) non-small cell lung cancer, adenocarcinoma presented with large left upper lobe lung mass in addition to left hilar lymphadenopathy diagnosed in April 2018. Her previous workup was done at [facility] in [facility] There was insufficient material to perform the molecular studies.  GUARDANT 360 Molecular studies:BRCA2 N36f. Negative for EGFR, ALK, ROS1, BRAF mutations.  SUMMARY OF ONCOLOGIC HISTORY:   Adenocarcinoma of left lung, stage 2 ([place]   12/27/2016 Initial Diagnosis    Adenocarcinoma of left lung, stage 2 ([facility]     02/28/2017 Genetic Testing    Patient had genetic testing due to a BRCA2, N34059fvariant being identified on her Guardant360 testing.  She has a family history of pancreatic, lung, colon and brain cancer.  The Multi-Cancer Panel was ordered.  The Multi-Cancer Panel offered by [facility] includes sequencing and/or deletion duplication testing of the following 83 genes: ALK, APC, ATM, AXIN2,BAP1,  BARD1, BLM, BMPR1A, BRCA1, BRCA2, BRIP1, CASR, CDC73, CDH1, CDK4, CDKN1B, CDKN1C, CDKN2A (p14ARF), CDKN2A (p16INK4a), CEBPA, CHEK2, CTNNA1, DICER1, DIS3L2, EGFR (c.2369C>T, p.Thr790Met variant only), EPCAM (Deletion/duplication testing only), FH, FLCN, GATA2, GPC3, GREM1 (Promoter region deletion/duplication testing only), HOXB13 (c.251G>A, p.Gly84Glu), HRAS, KIT, MAX, MEN1, MET, MITF (c.952G>A, p.Glu318Lys variant only), MLH1, MSH2, MSH3, MSH6, MUTYH, NBN, NF1, NF2, NTHL1, PALB2, PDGFRA, PHOX2B, PMS2, POLD1, POLE, POT1, PRKAR1A, PTCH1, PTEN, RAD50, RAD51C, RAD51D, RB1, RECQL4, RET, RUNX1, SDHAF2, SDHA (sequence changes only), SDHB, SDHC, SDHD, SMAD4, SMARCA4, SMARCB1, SMARCE1, STK11, SUFU, TERC, TERT, TMEM127, TP53, TSC1, TSC2, VHL, WRN and WT1.   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   A VUS in the MET gene c.3290A>Herrera (p([id number] also identified.   Germline analysis of the BRCA2 gene was negative.   The date of this test report is 02/28/2017.        PRIOR THERAPY: A course of concurrent chemoradiation with weekly carboplatin for AUC of 2 and paclitaxel 45 MG/M2. First dose 01/24/2017.Status post 6cycles.  CURRENT THERAPY: the patient will begin consolidation therapy with Imfinzi given every 2 weeks. First dose expected 04/13/2017.  INTERVAL HISTORY: Felicia Herrera [place]61.o. female returns for routine follow-up by herself. The patient reports increased pain on her right side at her old surgical scar site. Pain has been going on for approximately 6 weeks. She is using oxycodone about 3 times a day. Rates her pain as 7-8 out of 10. She reports a cough which has been ongoing and not really worsening. The cough is nonproductive. Denies fevers and chills. Denies chest pain, shortness of breath at rest, and hemoptysis. Has mild dyspnea on exertion. Denies nausea, vomiting, constipation, diarrhea. The patient is here to discuss her recent restaging CT scan results.   Patient Active Problem List   Diagnosis Date Noted  . Genetic testing 03/09/2017  . 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 Stage 2 moderate COPD by GOLD classification ([facility]09/09/2016  . Adenocarcinoma of left lung, stage 2 ([facility]/02/2017  . Encounter for antineoplastic chemotherapy 12/27/2016  . Goals of care, counseling/discussion 12/27/2016  . GERD (gastroesophageal reflux disease) 12/17/2016  . Hypertension, essential, benign 12/17/2016  . History of COPD 12/09/2016  . History of lung cancer 12/09/2016  . Lung cancer ([facility]05/17/2018  . Primary hypothyroidism 11/04/2016    has No Known Allergies.  MEDICAL HISTORY: Past Medical History:  Diagnosis Date  .  Adenocarcinoma of left lung, stage 2 ([place]) 12/27/2016  . Cancer [facility])    Lun cancer: Right Dx 2008, s/p lobectomy.  Left Dx 09/2016  . COPD (chronic obstructive pulmonary disease) ([place])   . Encounter for antineoplastic chemotherapy 12/27/2016  . History of radiation therapy 01/24/17-03/08/17   left lung 2 Gy in 30 fractions  . Hyperlipidemia   . Hypertension   . Stroke [facility])     SURGICAL HISTORY: Past Surgical History:  Procedure Laterality Date  . BREAST BIOPSY     several. Denies Hx of breast cancer.  . IR FLUORO GUIDE PORT INSERTION RIGHT  02/04/2017  . IR US GUIDE VASC ACCESS RIGHT  02/04/2017  . THORACOTOMY/LOBECTOMY Right 2008  . TONSILLECTOMY  1960    SOCIAL HISTORY: Social History   Social History  . Marital status: Single    Spouse name: N/A  . Number of children: N/A  . Years of education: N/A   Occupational History  . landscaping    Social History Main Topics  . Smoking status: Former Smoker    Packs/day: 1.00    Years: 30.00    Types: Cigarettes    Quit date: 06/21/2005  . Smokeless tobacco: Never Used  . Alcohol use No  . Drug use: No  . Sexual activity: Not Currently   Other Topics Concern  . Not on file   Social History Narrative   Landscaper.    Lives alone.     FAMILY HISTORY: Family History  Problem Relation Age of Onset  . Arthritis Mother   . Lung cancer Mother 30       d.45 from cancer  . Hypertension Father   . Arthritis Father   . Pancreatic cancer Sister 60       d.60 from cancer  . Brain cancer Brother        d.~70  . Colon cancer Brother        d.~70  . Hypertension Brother   . Mental retardation Brother   . Melanoma Brother 19  . Lung cancer Maternal Aunt 66  . Colon cancer Maternal Grandfather   . Depression Neg Hx   . Heart disease Neg Hx     Review of Systems  Constitutional: Negative.   HENT:  Negative.   Eyes: Negative.   Respiratory: Positive for cough. Negative for hemoptysis and shortness of breath.   Cardiovascular: Negative.   Gastrointestinal: Negative.   Genitourinary: Negative.    Musculoskeletal: Negative.         Right-sided chest pain.  Skin: Negative.   Neurological: Negative.   Hematological: Negative.   Psychiatric/Behavioral: Negative.       PHYSICAL EXAMINATION  ECOG PERFORMANCE STATUS: 1 - Symptomatic but completely ambulatory  Vitals:   04/06/17 1308  BP: (!) 159/66  Pulse: (!) 109  Resp: 18  Temp: 98.6 F (37 C)  SpO2: 95%    Physical Exam  Constitutional: She is oriented to person, place, and time and well-developed, well-nourished, and in no distress. No distress.  HENT:  Head: Normocephalic and atraumatic.  Mouth/Throat: Oropharynx is clear and moist. No oropharyngeal exudate.  Eyes: Conjunctivae are normal. Right eye exhibits no discharge. Left eye exhibits no discharge. No scleral icterus.  Neck: Normal range of motion. Neck supple.  Cardiovascular: Normal rate, regular rhythm, normal heart sounds and intact distal pulses.   Pulmonary/Chest: Effort normal and breath sounds normal. No respiratory distress. She has no wheezes. She has no rales.  Abdominal: Soft. Bowel sounds are normal.  She exhibits no distension and no mass. There is no tenderness.  Musculoskeletal: Normal range of motion. She exhibits no edema.  Lymphadenopathy:    She has no cervical adenopathy.  Neurological: She is alert and oriented to person, place, and time. She exhibits normal muscle tone. Gait normal. Coordination normal.  Skin: Skin is warm and dry. No rash noted. She is not diaphoretic. No erythema. No pallor.  Psychiatric: Mood, memory, affect and judgment normal.    LABORATORY DATA:  CBC    Component Value Date/Time   WBC 5.9 04/04/2017 1131   WBC 7.3 02/04/2017 1248   RBC 3.61 (L) 04/04/2017 1131   RBC 3.83 (L) 02/04/2017 1248   HGB 10.6 (L) 04/04/2017 1131   HCT 31.7 (L) 04/04/2017 1131   PLT 475 (H) 04/04/2017 1131   MCV 87.8 04/04/2017 1131   MCH 29.4 04/04/2017 1131   MCH 27.4 02/04/2017 1248   MCHC 33.5 04/04/2017 1131   MCHC 32.9 02/04/2017 1248   RDW 22.9 (H)  04/04/2017 1131   LYMPHSABS 0.4 (L) 04/04/2017 1131   MONOABS 0.5 04/04/2017 1131   EOSABS 0.1 04/04/2017 1131   BASOSABS 0.1 04/04/2017 1131    CMP     Component Value Date/Time   NA 142 04/04/2017 1131   K 3.6 04/04/2017 1131   CL 102 11/04/2016 1255   CO2 27 04/04/2017 1131   GLUCOSE 123 04/04/2017 1131   BUN 10.1 04/04/2017 1131   CREATININE 0.7 04/04/2017 1131   CALCIUM 8.9 04/04/2017 1131   PROT 7.1 04/04/2017 1131   ALBUMIN 3.1 (L) 04/04/2017 1131   AST 15 04/04/2017 1131   ALT 12 04/04/2017 1131   ALKPHOS 95 04/04/2017 1131   BILITOT 0.33 04/04/2017 1131    RADIOGRAPHIC STUDIES:  Ct Chest W Contrast  Result Date: 04/04/2017 CLINICAL DATA:  Right thoracotomy and lobectomy in2008. Left lung cancer diagnosed in July 2018. EXAM: CT CHEST WITH CONTRAST TECHNIQUE: Multidetector CT imaging of the chest was performed during intravenous contrast administration. CONTRAST:  42m ISOVUE-300 IOPAMIDOL (ISOVUE-300) INJECTION 61% COMPARISON:  08/[place]/2016 CT [facility]: Distortion of cardiomediastinal anatomy due to right pneumonectomy. No pericardial effusion. No thoracic aortic aneurysm. Right IJ central line tip is in the distal SVC. Mediastinum/Nodes: Small mediastinal lymph nodes are evident without mediastinal lymphadenopathy. No left hilar lymphadenopathy. A tracheal stent is evident with some collapse of the stent noted along the cranial margin. Distal end of the stent is in the carina. Lungs/Pleura: Marked hyperexpansion of the left lung with centrilobular emphysema evident. Irregular left upper lobe mass measures 4.0 x 1.8 cm today. This compares to 4.7 x 3.1 cm when I remeasure it on the prior study. 4 mm left upper lobe nodule seen image 63 series 5 is stable. No pulmonary edema or pleural effusion. Upper Abdomen: Asymmetric elevation right hemidiaphragm. No suspicious adrenal nodule or mass. Musculoskeletal: Bone windows reveal  no worrisome lytic or sclerotic osseous lesions. IMPRESSION: 1. Interval decrease in size of the central left upper lobe pulmonary mass. 2. Stable 4 mm central left upper lobe pulmonary nodule. 3. Status post right pneumonectomy. 4. Stable position of the tracheal stent with some collapse along the cranial margin of the stent device. 5.  Emphysema. (([id number]) Electronically Signed   By: EMisty StanleyM.D.   On: 04/04/2017 15:56    ASSESSMENT and THERAPY PLAN:   Adenocarcinoma of left lung, stage 2 ([place] This is a very pleasant 61year old white female recently  diagnosed with unresectable a stage IIB non-small cell lung cancer, adenocarcinoma. The patient is currently undergoing a course of concurrent chemoradiation with weekly carboplatin and paclitaxel is status post 6cycles and has been tolerating this treatment fairly well with no significant adverse effects.  Patient was seen with Dr. Julien Nordmann. CT scan images were reviewed. There is no obvious abnormality on her right side at the site of the pain. Results were discussed with the patient. Explained to the patient that her pain may be neuropathic due to her prior surgery. Discussed with the patient options for management of her condition at this point including close observation and monitoring versus consolidation treatment with immunotherapy with Imfinzi (Durvalumab) 10 MG/KG every 2 weeks for a total of one year unless the patient developed toxicity or disease progression. The patient is interested in the treatment with consolidation Imfinzi (Durvalumab). Discussed the adverse effect of this treatment including but not limited to immunotherapy mediated skin rash, diarrhea, or inflammation of the lung, kidney, liver, thyroid or other endocrine dysfunction including type 1 diabetes mellitus.  The patient would like to proceed with treatment as planned and she is expected to start the first cycle next week.  For her neuropathic pain, I have  prescribed gabapentin 300 mg 3 times a day. We also advised the patient to get a lidocaine patch to apply to the painful site which can be obtained over-the-counter.  For her cough, recommended that she use Delsym over-the-counter.  The patient will be seen back for follow-up in approximately 3 weeks for evaluation prior to cycle 2 of immunotherapy.  She was advised to call immediately if she has any concerning symptoms in the interval. The patient voices understanding of current disease status and treatment options and is in agreement with the current care plan.  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 The patient should be considered for prophylactic salpingo-oophorectomy. We will discuss this further with the patient at her next visit.   Orders Placed This Encounter  Procedures  . CBC with Differential/Platelet    Standing Status:   Standing    Number of Occurrences:   10    Standing Expiration Date:   04/06/2018  . Comprehensive metabolic panel    Standing Status:   Standing    Number of Occurrences:   10    Standing Expiration Date:   04/06/2018  . TSH    Standing Status:   Standing    Number of Occurrences:   8    Standing Expiration Date:   04/06/2018    All questions were answered. The patient knows to call the clinic with any problems, questions or concerns. We can certainly see the patient much sooner if necessary.  Mikey Bussing, NP 04/06/2017   ADDENDUM: Hematology/Oncology Attending: I had a face to face encounter with the patient. I recommended her care plan. This is a very pleasant 61 years old white female with unresectable a stage IIB non-small cell lung cancer, adenocarcinoma status post a course of concurrent chemoradiation with weekly carboplatin and paclitaxel. She tolerated her treatment fairly well. She continues to complain of pain on the right side of her chest.  She had repeat CT scan of the chest  performed recently. I personally and independently reviewed the scan images and discuss the results with the patient today. Her scan showed improvement of her disease and no clear osseous metastatic disease or other abnormality on the right chest wall. I had a lengthy  discussion with the patient today about her current treatment options including continuous observation and monitoring versus consideration of consolidation treatment with immunotherapy with Imfinzi (Durvalumab) 10 MG/KG every 2 weeks for a total of one year unless the patient has unacceptable toxicity or disease progression. I discussed with the patient adverse effect of this treatment including but not limited to immunotherapy mediated skin rash, diarrhea, inflammation of the lung, liver, kidney, thyroid or other endocrine dysfunction including type 1 diabetes mellitus. The patient is interested in proceeding with the consolidation immunotherapy and she is expected to start the first cycle of this treatment in less than 2 weeks. She was also seen by genetic counseling recently for gene abnormality and the recommendation is to consider the patient for bilateral oophorectomy because of the high risk of ovarian cancer. We will discuss this further with the patient and the upcoming visits. He patient would come back for follow-up visit in 3 weeks for evaluation with the start of cycle #2 of her treatment. She was advised to call immediately if she has any concerning symptoms in the interval. Disclaimer: This note was dictated with voice recognition software. Similar sounding words can inadvertently be transcribed and may be missed upon review. Eilleen Kempf, MD 04/09/17

## 2017-04-11 ENCOUNTER — Telehealth: Payer: Self-pay | Admitting: Internal Medicine

## 2017-04-11 NOTE — Telephone Encounter (Signed)
Scheduled appt per 10/22 sch msg - left patient a voicemail regarding her appointment times.

## 2017-04-13 ENCOUNTER — Other Ambulatory Visit (HOSPITAL_BASED_OUTPATIENT_CLINIC_OR_DEPARTMENT_OTHER): Payer: Medicare Other

## 2017-04-13 ENCOUNTER — Ambulatory Visit (HOSPITAL_BASED_OUTPATIENT_CLINIC_OR_DEPARTMENT_OTHER): Payer: Medicare Other

## 2017-04-13 ENCOUNTER — Ambulatory Visit: Payer: Medicare Other

## 2017-04-13 VITALS — BP 119/70 | HR 100 | Temp 98.6°F | Resp 17

## 2017-04-13 DIAGNOSIS — Z5112 Encounter for antineoplastic immunotherapy: Secondary | ICD-10-CM | POA: Diagnosis not present

## 2017-04-13 DIAGNOSIS — C3492 Malignant neoplasm of unspecified part of left bronchus or lung: Secondary | ICD-10-CM | POA: Diagnosis not present

## 2017-04-13 DIAGNOSIS — E039 Hypothyroidism, unspecified: Secondary | ICD-10-CM

## 2017-04-13 DIAGNOSIS — C3411 Malignant neoplasm of upper lobe, right bronchus or lung: Secondary | ICD-10-CM

## 2017-04-13 DIAGNOSIS — Z5189 Encounter for other specified aftercare: Secondary | ICD-10-CM | POA: Diagnosis not present

## 2017-04-13 DIAGNOSIS — R5383 Other fatigue: Secondary | ICD-10-CM

## 2017-04-13 DIAGNOSIS — Z95828 Presence of other vascular implants and grafts: Secondary | ICD-10-CM

## 2017-04-13 LAB — CBC WITH DIFFERENTIAL/PLATELET
BASO%: 0.4 % (ref 0.0–2.0)
BASOS ABS: 0 10*3/uL (ref 0.0–0.1)
EOS%: 1.1 % (ref 0.0–7.0)
Eosinophils Absolute: 0.1 10*3/uL (ref 0.0–0.5)
HEMATOCRIT: 35.3 % (ref 34.8–46.6)
HGB: 11.3 g/dL — ABNORMAL LOW (ref 11.6–15.9)
LYMPH#: 0.7 10*3/uL — AB (ref 0.9–3.3)
LYMPH%: 9.2 % — AB (ref 14.0–49.7)
MCH: 29.3 pg (ref 25.1–34.0)
MCHC: 32 g/dL (ref 31.5–36.0)
MCV: 91.5 fL (ref 79.5–101.0)
MONO#: 0.6 10*3/uL (ref 0.1–0.9)
MONO%: 8.9 % (ref 0.0–14.0)
NEUT#: 5.8 10*3/uL (ref 1.5–6.5)
NEUT%: 80.4 % — AB (ref 38.4–76.8)
PLATELETS: 430 10*3/uL — AB (ref 145–400)
RBC: 3.86 10*6/uL (ref 3.70–5.45)
RDW: 19.6 % — ABNORMAL HIGH (ref 11.2–14.5)
WBC: 7.2 10*3/uL (ref 3.9–10.3)

## 2017-04-13 LAB — COMPREHENSIVE METABOLIC PANEL
ALT: 10 U/L (ref 0–55)
ANION GAP: 11 meq/L (ref 3–11)
AST: 12 U/L (ref 5–34)
Albumin: 3.5 g/dL (ref 3.5–5.0)
Alkaline Phosphatase: 98 U/L (ref 40–150)
BUN: 13 mg/dL (ref 7.0–26.0)
CHLORIDE: 103 meq/L (ref 98–109)
CO2: 26 meq/L (ref 22–29)
CREATININE: 0.7 mg/dL (ref 0.6–1.1)
Calcium: 10 mg/dL (ref 8.4–10.4)
GLUCOSE: 112 mg/dL (ref 70–140)
Potassium: 4.1 mEq/L (ref 3.5–5.1)
Sodium: 141 mEq/L (ref 136–145)
TOTAL PROTEIN: 8 g/dL (ref 6.4–8.3)
Total Bilirubin: 0.52 mg/dL (ref 0.20–1.20)

## 2017-04-13 LAB — TSH: TSH: 1.11 m(IU)/L (ref 0.308–3.960)

## 2017-04-13 MED ORDER — SODIUM CHLORIDE 0.9% FLUSH
10.0000 mL | INTRAVENOUS | Status: DC | PRN
  Administered 2017-04-13: 10 mL via INTRAVENOUS
  Filled 2017-04-13: qty 10

## 2017-04-13 MED ORDER — ALTEPLASE 2 MG IJ SOLR
2.0000 mg | Freq: Once | INTRAMUSCULAR | Status: AC | PRN
  Administered 2017-04-13: 2 mg
  Filled 2017-04-13: qty 2

## 2017-04-13 MED ORDER — SODIUM CHLORIDE 0.9% FLUSH
10.0000 mL | INTRAVENOUS | Status: AC | PRN
  Administered 2017-04-13: 10 mL
  Filled 2017-04-13: qty 10

## 2017-04-13 MED ORDER — SODIUM CHLORIDE 0.9 % IV SOLN
Freq: Once | INTRAVENOUS | Status: AC
  Administered 2017-04-13: 14:00:00 via INTRAVENOUS

## 2017-04-13 MED ORDER — HEPARIN SOD (PORK) LOCK FLUSH 100 UNIT/ML IV SOLN
500.0000 [IU] | INTRAVENOUS | Status: AC | PRN
  Administered 2017-04-13: 500 [IU]
  Filled 2017-04-13: qty 5

## 2017-04-13 MED ORDER — SODIUM CHLORIDE 0.9 % IV SOLN
740.0000 mg | Freq: Once | INTRAVENOUS | Status: AC
  Administered 2017-04-13: 740 mg via INTRAVENOUS
  Filled 2017-04-13: qty 4.8

## 2017-04-13 NOTE — Patient Instructions (Signed)
Implanted Port Home Guide An implanted port is a type of central line that is placed under the skin. Central lines are used to provide IV access when treatment or nutrition needs to be given through a person's veins. Implanted ports are used for long-term IV access. An implanted port may be placed because:  You need IV medicine that would be irritating to the small veins in your hands or arms.  You need long-term IV medicines, such as antibiotics.  You need IV nutrition for a long period.  You need frequent blood draws for lab tests.  You need dialysis.  Implanted ports are usually placed in the chest area, but they can also be placed in the upper arm, the abdomen, or the leg. An implanted port has two main parts:  Reservoir. The reservoir is round and will appear as a small, raised area under your skin. The reservoir is the part where a needle is inserted to give medicines or draw blood.  Catheter. The catheter is a thin, flexible tube that extends from the reservoir. The catheter is placed into a large vein. Medicine that is inserted into the reservoir goes into the catheter and then into the vein.  How will I care for my incision site? Do not get the incision site wet. Bathe or shower as directed by your health care provider. How is my port accessed? Special steps must be taken to access the port:  Before the port is accessed, a numbing cream can be placed on the skin. This helps numb the skin over the port site.  Your health care provider uses a sterile technique to access the port. ? Your health care provider must put on a mask and sterile gloves. ? The skin over your port is cleaned carefully with an antiseptic and allowed to dry. ? The port is gently pinched between sterile gloves, and a needle is inserted into the port.  Only "non-coring" port needles should be used to access the port. Once the port is accessed, a blood return should be checked. This helps ensure that the port  is in the vein and is not clogged.  If your port needs to remain accessed for a constant infusion, a clear (transparent) bandage will be placed over the needle site. The bandage and needle will need to be changed every week, or as directed by your health care provider.  Keep the bandage covering the needle clean and dry. Do not get it wet. Follow your health care provider's instructions on how to take a shower or bath while the port is accessed.  If your port does not need to stay accessed, no bandage is needed over the port.  What is flushing? Flushing helps keep the port from getting clogged. Follow your health care provider's instructions on how and when to flush the port. Ports are usually flushed with saline solution or a medicine called heparin. The need for flushing will depend on how the port is used.  If the port is used for intermittent medicines or blood draws, the port will need to be flushed: ? After medicines have been given. ? After blood has been drawn. ? As part of routine maintenance.  If a constant infusion is running, the port may not need to be flushed.  How long will my port stay implanted? The port can stay in for as long as your health care provider thinks it is needed. When it is time for the port to come out, surgery will be   done to remove it. The procedure is similar to the one performed when the port was put in. When should I seek immediate medical care? When you have an implanted port, you should seek immediate medical care if:  You notice a bad smell coming from the incision site.  You have swelling, redness, or drainage at the incision site.  You have more swelling or pain at the port site or the surrounding area.  You have a fever that is not controlled with medicine.  This information is not intended to replace advice given to you by your health care provider. Make sure you discuss any questions you have with your health care provider. Document  Released: 06/07/2005 Document Revised: 11/13/2015 Document Reviewed: 02/12/2013 Elsevier Interactive Patient Education  2017 [employer].  

## 2017-04-13 NOTE — Progress Notes (Signed)
Port-a-cath accessed, site flushes well with NS, no blood return noted. No pain or swelling noted when flushed with NS. Cath-Flo instilled per policy. Site marked, will monitor for blood return.

## 2017-04-13 NOTE — Patient Instructions (Signed)
[facility] Discharge Instructions for Patients Receiving Chemotherapy  Today you received the following chemotherapy agents: Imfinzi   To help prevent nausea and vomiting after your treatment, we encourage you to take your nausea medication as prescribed.   If you develop nausea and vomiting that is not controlled by your nausea medication, call the clinic.   BELOW ARE SYMPTOMS THAT SHOULD BE REPORTED IMMEDIATELY:  *FEVER GREATER THAN 100.5 F  *CHILLS WITH OR WITHOUT FEVER  NAUSEA AND VOMITING THAT IS NOT CONTROLLED WITH YOUR NAUSEA MEDICATION  *UNUSUAL SHORTNESS OF BREATH  *UNUSUAL BRUISING OR BLEEDING  TENDERNESS IN MOUTH AND THROAT WITH OR WITHOUT PRESENCE OF ULCERS  *URINARY PROBLEMS  *BOWEL PROBLEMS  UNUSUAL RASH Items with * indicate a potential emergency and should be followed up as soon as possible.  Feel free to call the clinic should you have any questions or concerns. The clinic phone number is [phone].  Please show the [place] at check-in to the Emergency Department and triage nurse.  Durvalumab injection What is this medicine? DURVALUMAB (dur VAL ue mab) is a monoclonal antibody. It is used to treat urothelial cancer. This medicine may be used for other purposes; ask your health care provider or pharmacist if you have questions. COMMON BRAND NAME(S): IMFINZI What should I tell my health care provider before I take this medicine? They need to know if you have any of these conditions: -diabetes -immune system problems -infection -inflammatory bowel disease -kidney disease -liver disease -lung or breathing disease -lupus -organ transplant -stomach or intestine problems -thyroid disease -an unusual or allergic reaction to durvalumab, other medicines, foods, dyes, or preservatives -pregnant or trying to get pregnant -breast-feeding How should I use this medicine? This medicine is for infusion into a vein. It is given by a  health care professional in a hospital or clinic setting. A special MedGuide will be given to you before each treatment. Be sure to read this information carefully each time. Talk to your pediatrician regarding the use of this medicine in children. Special care may be needed. Overdosage: If you think you have taken too much of this medicine contact a poison control center or emergency room at once. NOTE: This medicine is only for you. Do not share this medicine with others. What if I miss a dose? It is important not to miss your dose. Call your doctor or health care professional if you are unable to keep an appointment. What may interact with this medicine? Interactions have not been studied. This list may not describe all possible interactions. Give your health care provider a list of all the medicines, herbs, non-prescription drugs, or dietary supplements you use. Also tell them if you smoke, drink alcohol, or use illegal drugs. Some items may interact with your medicine. What should I watch for while using this medicine? This drug may make you feel generally unwell. Continue your course of treatment even though you feel ill unless your doctor tells you to stop. You may need blood work done while you are taking this medicine. Do not become pregnant while taking this medicine or for 3 months after stopping it. Women should inform their doctor if they wish to become pregnant or think they might be pregnant. There is a potential for serious side effects to an unborn child. Talk to your health care professional or pharmacist for more information. Do not breast-feed an infant while taking this medicine or for 3 months after stopping it. What side effects  may I notice from receiving this medicine? Side effects that you should report to your doctor or health care professional as soon as possible: -allergic reactions like skin rash, itching or hives, swelling of the face, lips, or tongue -black, tarry  stools -bloody or watery diarrhea -breathing problems -change in emotions or moods -change in sex drive -changes in vision -chest pain or chest tightness -chills -confusion -cough -facial flushing -fever -headache -signs and symptoms of high blood sugar such as dizziness; dry mouth; dry skin; fruity breath; nausea; stomach pain; increased hunger or thirst; increased urination -signs and symptoms of liver injury like dark yellow or brown urine; general ill feeling or flu-like symptoms; light-colored stools; loss of appetite; nausea; right upper belly pain; unusually weak or tired; yellowing of the eyes or skin -stomach pain -trouble passing urine or change in the amount of urine -weight gain or weight loss Side effects that usually do not require medical attention (report these to your doctor or health care professional if they continue or are bothersome): -bone pain -constipation -loss of appetite -muscle pain -nausea -swelling of the ankles, feet, hands -tiredness This list may not describe all possible side effects. Call your doctor for medical advice about side effects. You may report side effects to FDA at 1-800-FDA-1088. Where should I keep my medicine? This drug is given in a hospital or clinic and will not be stored at home. NOTE: This sheet is a summary. It may not cover all possible information. If you have questions about this medicine, talk to your doctor, pharmacist, or health care provider.  2018 Elsevier/Gold Standard (2016-01-09 15:50:36)

## 2017-04-20 ENCOUNTER — Other Ambulatory Visit: Payer: Self-pay

## 2017-04-20 MED ORDER — ATORVASTATIN CALCIUM 40 MG PO TABS: 40.0000 mg | ORAL_TABLET | Freq: Every day | ORAL | 1 refills | Status: DC

## 2017-04-27 ENCOUNTER — Other Ambulatory Visit (HOSPITAL_BASED_OUTPATIENT_CLINIC_OR_DEPARTMENT_OTHER): Payer: Medicare Other

## 2017-04-27 ENCOUNTER — Telehealth: Payer: Self-pay | Admitting: Nurse Practitioner

## 2017-04-27 ENCOUNTER — Ambulatory Visit: Payer: Medicare Other

## 2017-04-27 ENCOUNTER — Ambulatory Visit (HOSPITAL_BASED_OUTPATIENT_CLINIC_OR_DEPARTMENT_OTHER): Payer: Medicare Other

## 2017-04-27 ENCOUNTER — Ambulatory Visit (HOSPITAL_BASED_OUTPATIENT_CLINIC_OR_DEPARTMENT_OTHER): Payer: Medicare Other | Admitting: Nurse Practitioner

## 2017-04-27 ENCOUNTER — Encounter: Payer: Self-pay | Admitting: Nurse Practitioner

## 2017-04-27 VITALS — BP 140/70 | HR 96 | Temp 98.4°F | Resp 18 | Ht 62.0 in | Wt 146.5 lb

## 2017-04-27 DIAGNOSIS — C3492 Malignant neoplasm of unspecified part of left bronchus or lung: Secondary | ICD-10-CM | POA: Diagnosis present

## 2017-04-27 DIAGNOSIS — R0789 Other chest pain: Secondary | ICD-10-CM

## 2017-04-27 DIAGNOSIS — Z5112 Encounter for antineoplastic immunotherapy: Secondary | ICD-10-CM | POA: Diagnosis not present

## 2017-04-27 DIAGNOSIS — C3412 Malignant neoplasm of upper lobe, left bronchus or lung: Secondary | ICD-10-CM | POA: Diagnosis not present

## 2017-04-27 DIAGNOSIS — C3411 Malignant neoplasm of upper lobe, right bronchus or lung: Secondary | ICD-10-CM

## 2017-04-27 DIAGNOSIS — Z95828 Presence of other vascular implants and grafts: Secondary | ICD-10-CM

## 2017-04-27 LAB — COMPREHENSIVE METABOLIC PANEL
ALBUMIN: 3.2 g/dL — AB (ref 3.5–5.0)
ALK PHOS: 92 U/L (ref 40–150)
ALT: 9 U/L (ref 0–55)
ANION GAP: 9 meq/L (ref 3–11)
AST: 11 U/L (ref 5–34)
BILIRUBIN TOTAL: 0.35 mg/dL (ref 0.20–1.20)
BUN: 11.1 mg/dL (ref 7.0–26.0)
CALCIUM: 9.5 mg/dL (ref 8.4–10.4)
CO2: 27 mEq/L (ref 22–29)
Chloride: 107 mEq/L (ref 98–109)
Creatinine: 0.7 mg/dL (ref 0.6–1.1)
Glucose: 111 mg/dl (ref 70–140)
POTASSIUM: 4.1 meq/L (ref 3.5–5.1)
SODIUM: 143 meq/L (ref 136–145)
TOTAL PROTEIN: 7.4 g/dL (ref 6.4–8.3)

## 2017-04-27 LAB — CBC WITH DIFFERENTIAL/PLATELET
BASO%: 0.7 % (ref 0.0–2.0)
Basophils Absolute: 0 10*3/uL (ref 0.0–0.1)
EOS%: 1.2 % (ref 0.0–7.0)
Eosinophils Absolute: 0.1 10*3/uL (ref 0.0–0.5)
HEMATOCRIT: 32.6 % — AB (ref 34.8–46.6)
HGB: 10.7 g/dL — ABNORMAL LOW (ref 11.6–15.9)
LYMPH#: 0.6 10*3/uL — AB (ref 0.9–3.3)
LYMPH%: 10.1 % — ABNORMAL LOW (ref 14.0–49.7)
MCH: 29.3 pg (ref 25.1–34.0)
MCHC: 32.7 g/dL (ref 31.5–36.0)
MCV: 89.5 fL (ref 79.5–101.0)
MONO#: 0.6 10*3/uL (ref 0.1–0.9)
MONO%: 11 % (ref 0.0–14.0)
NEUT%: 77 % — ABNORMAL HIGH (ref 38.4–76.8)
NEUTROS ABS: 4.5 10*3/uL (ref 1.5–6.5)
PLATELETS: 399 10*3/uL (ref 145–400)
RBC: 3.65 10*6/uL — AB (ref 3.70–5.45)
RDW: 19.5 % — AB (ref 11.2–14.5)
WBC: 5.9 10*3/uL (ref 3.9–10.3)

## 2017-04-27 MED ORDER — SODIUM CHLORIDE 0.9% FLUSH
10.0000 mL | INTRAVENOUS | Status: DC | PRN
  Administered 2017-04-27: 10 mL
  Filled 2017-04-27: qty 10

## 2017-04-27 MED ORDER — HEPARIN SOD (PORK) LOCK FLUSH 100 UNIT/ML IV SOLN
500.0000 [IU] | Freq: Once | INTRAVENOUS | Status: DC | PRN
  Filled 2017-04-27: qty 5

## 2017-04-27 MED ORDER — SODIUM CHLORIDE 0.9 % IV SOLN
740.0000 mg | Freq: Once | INTRAVENOUS | Status: AC
  Administered 2017-04-27: 740 mg via INTRAVENOUS
  Filled 2017-04-27: qty 10

## 2017-04-27 MED ORDER — SODIUM CHLORIDE 0.9 % IV SOLN
Freq: Once | INTRAVENOUS | Status: AC
  Administered 2017-04-27: 15:00:00 via INTRAVENOUS

## 2017-04-27 MED ORDER — SODIUM CHLORIDE 0.9% FLUSH
10.0000 mL | INTRAVENOUS | Status: DC | PRN
  Administered 2017-04-27: 10 mL via INTRAVENOUS
  Filled 2017-04-27: qty 10

## 2017-04-27 NOTE — Telephone Encounter (Signed)
Scheduled appt per 11/7 los - Gave pt calender and AVS.

## 2017-04-27 NOTE — Progress Notes (Signed)
[facility] OFFICE PROGRESS NOTE    DIAGNOSIS: Stage IIB(T2b, N1, M0) non-small cell lung cancer, adenocarcinoma presented with large left upper lobe lung mass in addition to left hilar lymphadenopathy diagnosed in April 2018. Her previous workup was done at [facility] in [facility]. There was insufficient material to perform the molecular studies.  GUARDANT 360 Molecular studies:BRCA2 N371f. Negative for EGFR, ALK, ROS1, BRAF mutations.  SUMMARY OF ONCOLOGIC HISTORY:       [place] stage 2 [place]   12/27/2016 Initial Diagnosis    [place], stage 2 [place]     02/28/2017 Genetic Testing    Patient had genetic testing due to a BRCA2, N34071fvariant being identified on her Guardant360 testing.  She has a family history of pancreatic, lung, colon and brain cancer.  The Multi-Cancer Panel was ordered.  The Multi-Cancer Panel offered by [facility] includes sequencing and/or deletion duplication testing of the following 83 genes: ALK, APC, ATM, AXIN2,BAP1,  BARD1, BLM, BMPR1A, BRCA1, BRCA2, BRIP1, CASR, CDC73, CDH1, CDK4, CDKN1B, CDKN1C, CDKN2A (p14ARF), CDKN2A (p16INK4a), CEBPA, CHEK2, CTNNA1, DICER1, DIS3L2, EGFR (c.2369C>T, p.Thr790Met variant only), EPCAM (Deletion/duplication testing only), FH, FLCN, GATA2, GPC3, GREM1 (Promoter region deletion/duplication testing only), HOXB13 (c.251G>A, p.Gly84Glu), HRAS, KIT, MAX, MEN1, MET, MITF (c.952G>A, p.Glu318Lys variant only), MLH1, MSH2, MSH3, MSH6, MUTYH, NBN, NF1, NF2, NTHL1, PALB2, PDGFRA, PHOX2B, PMS2, POLD1, POLE, POT1, PRKAR1A, PTCH1, PTEN, RAD50, RAD51C, RAD51D, RB1, RECQL4, RET, RUNX1, SDHAF2, SDHA (sequence changes only), SDHB, SDHC, SDHD, SMAD4, SMARCA4, SMARCB1, SMARCE1, STK11, SUFU, TERC, TERT, TMEM127, TP53, TSC1, TSC2, VHL, WRN and WT1.   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   Germline analysis of the BRCA2 gene was negative.   The date of this test report is 02/28/2017.        PRIOR THERAPY: A course of concurrent chemoradiation with weekly carboplatin for AUC of 2 and paclitaxel 45 MG/M2. First dose 01/24/2017.Status post 6cycles.  CURRENT THERAPY: the patient will begin consolidation therapy with Imfinzi given every 2 weeks. First dose 04/13/2017.  INTERVAL HISTORY:   [name] as scheduled.  She completed cycle 1 consolidation Imfinzi 04/13/2017.  She denies nausea/vomiting.  No diarrhea.  No rash.  She has stable dyspnea on exertion and a stable cough.  She continues to have right-sided chest pain.  She is taking Neurontin 3 times a day as well as Percocet at bedtime.  She also utilizes a lidocaine patch.  All of these provide partial relief.  Objective:  Vital signs in last 24 hours:  Blood pressure 140/70, pulse 96, temperature 98.4 F (36.9 C), temperature source Oral, resp. rate 18, height _0  (1.575 m), weight 146 lb 8 oz (66.5 kg), SpO2 98 %.    HEENT: No thrush or ulcers. Resp: Lungs clear bilaterally. Cardio: Regular rate and rhythm. GI: Abdomen soft and nontender.  No hepatomegaly. Vascular: No leg edema.  Skin: No rash right chest wall. Port-A-Cath without erythema.   Lab Results:  Lab Results  Component Value Date   WBC 5.9 04/27/2017   HGB 10.7 (L) 04/27/2017   HCT 32.6 (L) 04/27/2017   MCV 89.5 04/27/2017   PLT 399 04/27/2017   NEUTROABS 4.5 04/27/2017    Imaging:  No results found.  Medications: I have reviewed the patient's current medications.  Assessment/Plan: 1. Unresectable stage IIB non-small cell lung cancer, adenocarcinoma, status post concurrent  chemoradiation with weekly carboplatin/Taxol x 7 cycles; initiation of consolidation Imfinzi 04/13/2017. 2. Pain right chest wall.  She continues Neurontin, Percocet and lidocaine patch with partial  relief.   Disposition: Felicia Herrera appears stable.  She has completed 1 cycle of Imfinzi.  Plan to proceed with cycle 2 today.  She will return for a follow-up visit in 2 weeks.  She will contact the office in the interim with any problems.     Ned Card ANP/GNP-BC   04/27/2017  2:19 PM

## 2017-04-27 NOTE — Patient Instructions (Signed)
[facility] Discharge Instructions for Patients Receiving Chemotherapy  Today you received the following chemotherapy agents: Imfinzi   To help prevent nausea and vomiting after your treatment, we encourage you to take your nausea medication as prescribed.   If you develop nausea and vomiting that is not controlled by your nausea medication, call the clinic.   BELOW ARE SYMPTOMS THAT SHOULD BE REPORTED IMMEDIATELY:  *FEVER GREATER THAN 100.5 F  *CHILLS WITH OR WITHOUT FEVER  NAUSEA AND VOMITING THAT IS NOT CONTROLLED WITH YOUR NAUSEA MEDICATION  *UNUSUAL SHORTNESS OF BREATH  *UNUSUAL BRUISING OR BLEEDING  TENDERNESS IN MOUTH AND THROAT WITH OR WITHOUT PRESENCE OF ULCERS  *URINARY PROBLEMS  *BOWEL PROBLEMS  UNUSUAL RASH Items with * indicate a potential emergency and should be followed up as soon as possible.  Feel free to call the clinic should you have any questions or concerns. The clinic phone number is [phone].  Please show the [place] at check-in to the Emergency Department and triage nurse.  Durvalumab injection What is this medicine? DURVALUMAB (dur VAL ue mab) is a monoclonal antibody. It is used to treat urothelial cancer. This medicine may be used for other purposes; ask your health care provider or pharmacist if you have questions. COMMON BRAND NAME(S): IMFINZI What should I tell my health care provider before I take this medicine? They need to know if you have any of these conditions: -diabetes -immune system problems -infection -inflammatory bowel disease -kidney disease -liver disease -lung or breathing disease -lupus -organ transplant -stomach or intestine problems -thyroid disease -an unusual or allergic reaction to durvalumab, other medicines, foods, dyes, or preservatives -pregnant or trying to get pregnant -breast-feeding How should I use this medicine? This medicine is for infusion into a vein. It is given by a  health care professional in a hospital or clinic setting. A special MedGuide will be given to you before each treatment. Be sure to read this information carefully each time. Talk to your pediatrician regarding the use of this medicine in children. Special care may be needed. Overdosage: If you think you have taken too much of this medicine contact a poison control center or emergency room at once. NOTE: This medicine is only for you. Do not share this medicine with others. What if I miss a dose? It is important not to miss your dose. Call your doctor or health care professional if you are unable to keep an appointment. What may interact with this medicine? Interactions have not been studied. This list may not describe all possible interactions. Give your health care provider a list of all the medicines, herbs, non-prescription drugs, or dietary supplements you use. Also tell them if you smoke, drink alcohol, or use illegal drugs. Some items may interact with your medicine. What should I watch for while using this medicine? This drug may make you feel generally unwell. Continue your course of treatment even though you feel ill unless your doctor tells you to stop. You may need blood work done while you are taking this medicine. Do not become pregnant while taking this medicine or for 3 months after stopping it. Women should inform their doctor if they wish to become pregnant or think they might be pregnant. There is a potential for serious side effects to an unborn child. Talk to your health care professional or pharmacist for more information. Do not breast-feed an infant while taking this medicine or for 3 months after stopping it. What side effects  may I notice from receiving this medicine? Side effects that you should report to your doctor or health care professional as soon as possible: -allergic reactions like skin rash, itching or hives, swelling of the face, lips, or tongue -black, tarry  stools -bloody or watery diarrhea -breathing problems -change in emotions or moods -change in sex drive -changes in vision -chest pain or chest tightness -chills -confusion -cough -facial flushing -fever -headache -signs and symptoms of high blood sugar such as dizziness; dry mouth; dry skin; fruity breath; nausea; stomach pain; increased hunger or thirst; increased urination -signs and symptoms of liver injury like dark yellow or brown urine; general ill feeling or flu-like symptoms; light-colored stools; loss of appetite; nausea; right upper belly pain; unusually weak or tired; yellowing of the eyes or skin -stomach pain -trouble passing urine or change in the amount of urine -weight gain or weight loss Side effects that usually do not require medical attention (report these to your doctor or health care professional if they continue or are bothersome): -bone pain -constipation -loss of appetite -muscle pain -nausea -swelling of the ankles, feet, hands -tiredness This list may not describe all possible side effects. Call your doctor for medical advice about side effects. You may report side effects to FDA at 1-800-FDA-1088. Where should I keep my medicine? This drug is given in a hospital or clinic and will not be stored at home. NOTE: This sheet is a summary. It may not cover all possible information. If you have questions about this medicine, talk to your doctor, pharmacist, or health care provider.  2018 Elsevier/Gold Standard (2016-01-09 15:50:36)

## 2017-04-27 NOTE — Patient Instructions (Signed)
Implanted Port Home Guide An implanted port is a type of central line that is placed under the skin. Central lines are used to provide IV access when treatment or nutrition needs to be given through a person's veins. Implanted ports are used for long-term IV access. An implanted port may be placed because:  You need IV medicine that would be irritating to the small veins in your hands or arms.  You need long-term IV medicines, such as antibiotics.  You need IV nutrition for a long period.  You need frequent blood draws for lab tests.  You need dialysis.  Implanted ports are usually placed in the chest area, but they can also be placed in the upper arm, the abdomen, or the leg. An implanted port has two main parts:  Reservoir. The reservoir is round and will appear as a small, raised area under your skin. The reservoir is the part where a needle is inserted to give medicines or draw blood.  Catheter. The catheter is a thin, flexible tube that extends from the reservoir. The catheter is placed into a large vein. Medicine that is inserted into the reservoir goes into the catheter and then into the vein.  How will I care for my incision site? Do not get the incision site wet. Bathe or shower as directed by your health care provider. How is my port accessed? Special steps must be taken to access the port:  Before the port is accessed, a numbing cream can be placed on the skin. This helps numb the skin over the port site.  Your health care provider uses a sterile technique to access the port. ? Your health care provider must put on a mask and sterile gloves. ? The skin over your port is cleaned carefully with an antiseptic and allowed to dry. ? The port is gently pinched between sterile gloves, and a needle is inserted into the port.  Only "non-coring" port needles should be used to access the port. Once the port is accessed, a blood return should be checked. This helps ensure that the port  is in the vein and is not clogged.  If your port needs to remain accessed for a constant infusion, a clear (transparent) bandage will be placed over the needle site. The bandage and needle will need to be changed every week, or as directed by your health care provider.  Keep the bandage covering the needle clean and dry. Do not get it wet. Follow your health care provider's instructions on how to take a shower or bath while the port is accessed.  If your port does not need to stay accessed, no bandage is needed over the port.  What is flushing? Flushing helps keep the port from getting clogged. Follow your health care provider's instructions on how and when to flush the port. Ports are usually flushed with saline solution or a medicine called heparin. The need for flushing will depend on how the port is used.  If the port is used for intermittent medicines or blood draws, the port will need to be flushed: ? After medicines have been given. ? After blood has been drawn. ? As part of routine maintenance.  If a constant infusion is running, the port may not need to be flushed.  How long will my port stay implanted? The port can stay in for as long as your health care provider thinks it is needed. When it is time for the port to come out, surgery will be   done to remove it. The procedure is similar to the one performed when the port was put in. When should I seek immediate medical care? When you have an implanted port, you should seek immediate medical care if:  You notice a bad smell coming from the incision site.  You have swelling, redness, or drainage at the incision site.  You have more swelling or pain at the port site or the surrounding area.  You have a fever that is not controlled with medicine.  This information is not intended to replace advice given to you by your health care provider. Make sure you discuss any questions you have with your health care provider. Document  Released: 06/07/2005 Document Revised: 11/13/2015 Document Reviewed: 02/12/2013 Elsevier Interactive Patient Education  2017 [employer].  

## 2017-05-09 ENCOUNTER — Telehealth: Payer: Self-pay | Admitting: Medical Oncology

## 2017-05-09 NOTE — Telephone Encounter (Signed)
err

## 2017-05-10 ENCOUNTER — Ambulatory Visit: Payer: Medicare Other

## 2017-05-10 ENCOUNTER — Other Ambulatory Visit (HOSPITAL_BASED_OUTPATIENT_CLINIC_OR_DEPARTMENT_OTHER): Payer: Medicare Other

## 2017-05-10 ENCOUNTER — Ambulatory Visit (HOSPITAL_BASED_OUTPATIENT_CLINIC_OR_DEPARTMENT_OTHER): Payer: Medicare Other | Admitting: Oncology

## 2017-05-10 ENCOUNTER — Ambulatory Visit (HOSPITAL_BASED_OUTPATIENT_CLINIC_OR_DEPARTMENT_OTHER): Payer: Medicare Other

## 2017-05-10 ENCOUNTER — Encounter: Payer: Self-pay | Admitting: Oncology

## 2017-05-10 VITALS — BP 129/56 | HR 95 | Temp 98.0°F | Resp 20 | Ht 62.0 in | Wt 147.8 lb

## 2017-05-10 VITALS — BP 122/70 | HR 89 | Temp 98.7°F | Resp 24

## 2017-05-10 DIAGNOSIS — Z5112 Encounter for antineoplastic immunotherapy: Secondary | ICD-10-CM

## 2017-05-10 DIAGNOSIS — G629 Polyneuropathy, unspecified: Secondary | ICD-10-CM | POA: Diagnosis not present

## 2017-05-10 DIAGNOSIS — Z79899 Other long term (current) drug therapy: Secondary | ICD-10-CM | POA: Diagnosis not present

## 2017-05-10 DIAGNOSIS — G580 Intercostal neuropathy: Secondary | ICD-10-CM | POA: Insufficient documentation

## 2017-05-10 DIAGNOSIS — C3412 Malignant neoplasm of upper lobe, left bronchus or lung: Secondary | ICD-10-CM

## 2017-05-10 DIAGNOSIS — C3492 Malignant neoplasm of unspecified part of left bronchus or lung: Secondary | ICD-10-CM

## 2017-05-10 DIAGNOSIS — Z95828 Presence of other vascular implants and grafts: Secondary | ICD-10-CM

## 2017-05-10 DIAGNOSIS — R5383 Other fatigue: Secondary | ICD-10-CM

## 2017-05-10 DIAGNOSIS — C3411 Malignant neoplasm of upper lobe, right bronchus or lung: Secondary | ICD-10-CM | POA: Diagnosis not present

## 2017-05-10 LAB — CBC WITH DIFFERENTIAL/PLATELET
BASO%: 0.4 % (ref 0.0–2.0)
BASOS ABS: 0 10*3/uL (ref 0.0–0.1)
EOS%: 1.4 % (ref 0.0–7.0)
Eosinophils Absolute: 0.1 10*3/uL (ref 0.0–0.5)
HEMATOCRIT: 30.8 % — AB (ref 34.8–46.6)
HGB: 10.1 g/dL — ABNORMAL LOW (ref 11.6–15.9)
LYMPH#: 0.5 10*3/uL — AB (ref 0.9–3.3)
LYMPH%: 7.3 % — AB (ref 14.0–49.7)
MCH: 29.1 pg (ref 25.1–34.0)
MCHC: 32.8 g/dL (ref 31.5–36.0)
MCV: 88.7 fL (ref 79.5–101.0)
MONO#: 0.6 10*3/uL (ref 0.1–0.9)
MONO%: 10.3 % (ref 0.0–14.0)
NEUT#: 5.1 10*3/uL (ref 1.5–6.5)
NEUT%: 80.6 % — AB (ref 38.4–76.8)
Platelets: 371 10*3/uL (ref 145–400)
RBC: 3.47 10*6/uL — AB (ref 3.70–5.45)
RDW: 17.1 % — ABNORMAL HIGH (ref 11.2–14.5)
WBC: 6.3 10*3/uL (ref 3.9–10.3)

## 2017-05-10 LAB — COMPREHENSIVE METABOLIC PANEL
ALK PHOS: 79 U/L (ref 40–150)
ALT: 8 U/L (ref 0–55)
AST: 9 U/L (ref 5–34)
Albumin: 3 g/dL — ABNORMAL LOW (ref 3.5–5.0)
Anion Gap: 9 mEq/L (ref 3–11)
BILIRUBIN TOTAL: 0.3 mg/dL (ref 0.20–1.20)
BUN: 11.1 mg/dL (ref 7.0–26.0)
CO2: 27 meq/L (ref 22–29)
Calcium: 9 mg/dL (ref 8.4–10.4)
Chloride: 106 mEq/L (ref 98–109)
Creatinine: 0.7 mg/dL (ref 0.6–1.1)
GLUCOSE: 108 mg/dL (ref 70–140)
POTASSIUM: 3.5 meq/L (ref 3.5–5.1)
SODIUM: 142 meq/L (ref 136–145)
TOTAL PROTEIN: 6.9 g/dL (ref 6.4–8.3)

## 2017-05-10 LAB — TSH: TSH: 0.92 m[IU]/L (ref 0.308–3.960)

## 2017-05-10 MED ORDER — SODIUM CHLORIDE 0.9 % IV SOLN
Freq: Once | INTRAVENOUS | Status: AC
  Administered 2017-05-10: 14:00:00 via INTRAVENOUS

## 2017-05-10 MED ORDER — SODIUM CHLORIDE 0.9% FLUSH
10.0000 mL | INTRAVENOUS | Status: DC | PRN
  Administered 2017-05-10: 10 mL via INTRAVENOUS
  Filled 2017-05-10: qty 10

## 2017-05-10 MED ORDER — HEPARIN SOD (PORK) LOCK FLUSH 100 UNIT/ML IV SOLN
500.0000 [IU] | Freq: Once | INTRAVENOUS | Status: AC | PRN
  Administered 2017-05-10: 500 [IU]
  Filled 2017-05-10: qty 5

## 2017-05-10 MED ORDER — SODIUM CHLORIDE 0.9% FLUSH
10.0000 mL | INTRAVENOUS | Status: DC | PRN
  Administered 2017-05-10: 10 mL
  Filled 2017-05-10: qty 10

## 2017-05-10 MED ORDER — SODIUM CHLORIDE 0.9 % IV SOLN
740.0000 mg | Freq: Once | INTRAVENOUS | Status: AC
  Administered 2017-05-10: 740 mg via INTRAVENOUS
  Filled 2017-05-10: qty 4.8

## 2017-05-10 NOTE — Patient Instructions (Signed)
[facility] Discharge Instructions for Patients Receiving Chemotherapy  Today you received the following chemotherapy agents:  Imfinzi (durvalumab)  To help prevent nausea and vomiting after your treatment, we encourage you to take your nausea medication as prescribed.   If you develop nausea and vomiting that is not controlled by your nausea medication, call the clinic.   BELOW ARE SYMPTOMS THAT SHOULD BE REPORTED IMMEDIATELY:  *FEVER GREATER THAN 100.5 F  *CHILLS WITH OR WITHOUT FEVER  NAUSEA AND VOMITING THAT IS NOT CONTROLLED WITH YOUR NAUSEA MEDICATION  *UNUSUAL SHORTNESS OF BREATH  *UNUSUAL BRUISING OR BLEEDING  TENDERNESS IN MOUTH AND THROAT WITH OR WITHOUT PRESENCE OF ULCERS  *URINARY PROBLEMS  *BOWEL PROBLEMS  UNUSUAL RASH Items with * indicate a potential emergency and should be followed up as soon as possible.  Feel free to call the clinic should you have any questions or concerns. The clinic phone number is [phone].  Please show the [place] at check-in to the Emergency Department and triage nurse.

## 2017-05-10 NOTE — Progress Notes (Signed)
[facility] OFFICE PROGRESS NOTE  [name], [name], MD [place] [address]  DIAGNOSIS: Stage IIB(T2b, N1, M0) non-small cell lung cancer, adenocarcinoma presented with large left upper lobe lung mass in addition to left hilar lymphadenopathy diagnosed in April 2018. Her previous workup was done at [facility] in [facility]. There was insufficient material to perform the molecular studies.  GUARDANT 360 Molecular studies:BRCA2 N338f. Negative for EGFR, ALK, ROS1, BRAF mutations.  PRIOR THERAPY: A course of concurrent chemoradiation with weekly carboplatin for AUC of 2 and paclitaxel 45 MG/M2. First dose 01/24/2017.Status post 6cycles.  CURRENT THERAPY: Consolidation therapy with Imfinzi given every 2 weeks. First dose 04/13/2017.  INTERVAL HISTORY: CTarrah Furuta61y.o. female returns for routine follow-up visit by herself.  The patient continues to tolerate her Imfinzi well.  She has ongoing neuropathic pain to her right axillary area where she had her prior surgery.  She was given a prescription for gabapentin 300 mg 3 times a day.  She was taking it routinely, but found that it did not help and has now cut back to 1 capsule daily.  She continues use Percocet at bedtime which helps her sleep and she does not have pain during that time.  She does not take Percocet during the day due to excessive drowsiness.  She has tried a lidocaine patch to this area with minimal relief.  She denies fevers and chills.  Denies chest pain, shortness of breath, hemoptysis.  He has a nonproductive cough.  Denies nausea, vomiting, constipation, diarrhea.  Patient is here for evaluation prior to cycle 3 of her immunotherapy.  MEDICAL HISTORY: Past Medical History:  Diagnosis Date  . Adenocarcinoma of left lung, stage 2 ([facility] 12/27/2016  . Cancer ([facility]    Lun cancer: Right Dx 2008, s/p lobectomy. Left Dx 09/2016  . COPD (chronic obstructive  pulmonary disease) ([facility]   . Encounter for antineoplastic chemotherapy 12/27/2016  . History of radiation therapy 01/24/17-03/08/17   left lung 2 Gy in 30 fractions  . Hyperlipidemia   . Hypertension   . Stroke ([facility]     ALLERGIES:  has No Known Allergies.  MEDICATIONS:  Current Outpatient Medications  Medication Sig Dispense Refill  . gabapentin (NEURONTIN) 300 MG capsule Take 1 capsule (300 mg total) by mouth 3 (three) times daily. 90 capsule 0  . aspirin EC 81 MG tablet Take 81 mg by mouth daily.    .Marland Kitchenatorvastatin (LIPITOR) 40 MG tablet Take 1 tablet (40 mg total) by mouth daily. 90 tablet 1  . Dextromethorphan HBr (COUGH SUPPRESSANT PO) Take by mouth.    . fluticasone (FLONASE) 50 MCG/ACT nasal spray Place 1 spray into both nostrils 2 (two) times daily. 16 Herrera 1  . fluticasone furoate-vilanterol (BREO ELLIPTA) 200-25 MCG/INH AEPB Inhale 1 puff into the lungs daily. 60 each 5  . levothyroxine (SYNTHROID, LEVOTHROID) 75 MCG tablet Take 75 mcg by mouth daily before breakfast.    . lidocaine-prilocaine (EMLA) cream Apply 1 application topically as needed. (Patient not taking: Reported on 03/07/2017) 30 Herrera 0  . omeprazole (PRILOSEC) 20 MG capsule Take 20 mg by mouth daily.    .Marland KitchenoxyCODONE-acetaminophen (PERCOCET/ROXICET) 5-325 MG tablet Take 1 tablet by mouth every 4 (four) hours as needed for severe pain. 90 tablet 0  . prochlorperazine (COMPAZINE) 10 MG tablet Take 1 tablet (10 mg total) by mouth every 6 (six) hours as needed for nausea or vomiting. 30 tablet 0  No current facility-administered medications for this visit.    Facility-Administered Medications Ordered in Other Visits  Medication Dose Route Frequency Provider Last Rate Last Dose  . durvalumab (IMFINZI) 740 mg in sodium chloride 0.9 % 100 mL chemo infusion  740 mg Intravenous Once Curt Bears, MD 115 mL/hr at 05/10/17 1456 740 mg at 05/10/17 1456  . heparin lock flush 100 unit/mL  500 Units Intracatheter Once PRN Curt Bears, MD      . sodium chloride flush (NS) 0.9 % injection 10 mL  10 mL Intracatheter PRN Curt Bears, MD        SURGICAL HISTORY:  Past Surgical History:  Procedure Laterality Date  . BREAST BIOPSY     several. Denies Hx of breast cancer.  . IR FLUORO GUIDE PORT INSERTION RIGHT  02/04/2017  . IR US GUIDE VASC ACCESS RIGHT  02/04/2017  . THORACOTOMY/LOBECTOMY Right 2008  . TONSILLECTOMY  1960    REVIEW OF SYSTEMS:   Review of Systems  Constitutional: Negative for appetite change, chills, fatigue, fever and unexpected weight change.  HENT:   Negative for mouth sores, nosebleeds, sore throat and trouble swallowing.   Eyes: Negative for eye problems and icterus.  Respiratory: Negative for hemoptysis, shortness of breath and wheezing.  Positive for nonproductive cough. Cardiovascular: Negative for chest pain and leg swelling.  Gastrointestinal: Negative for abdominal pain, constipation, diarrhea, nausea and vomiting.  Genitourinary: Negative for bladder incontinence, difficulty urinating, dysuria, frequency and hematuria.   Musculoskeletal: Negative for back pain, gait problem, neck pain and neck stiffness.  Skin: Negative for itching and rash.  Neurological: Negative for dizziness, extremity weakness, gait problem, headaches, light-headedness and seizures. Positive for neuropathic pain to her right axillary area at her prior surgical site. Hematological: Negative for adenopathy. Does not bruise/bleed easily.  Psychiatric/Behavioral: Negative for confusion, depression and sleep disturbance. The patient is not nervous/anxious.     PHYSICAL EXAMINATION:  Blood pressure (!) 129/56, pulse 95, temperature 98 F (36.7 C), temperature source Oral, resp. rate 20, height '5\' 2"'$  (1.575 m), weight 147 lb 12.8 oz (67 kg), SpO2 97 %.  ECOG PERFORMANCE STATUS: 1 - Symptomatic but completely ambulatory  Physical Exam  Constitutional: Oriented to person, place, and time and well-developed,  well-nourished, and in no distress. No distress.  HENT:  Head: Normocephalic and atraumatic.  Mouth/Throat: Oropharynx is clear and moist. No oropharyngeal exudate.  Eyes: Conjunctivae are normal. Right eye exhibits no discharge. Left eye exhibits no discharge. No scleral icterus.  Neck: Normal range of motion. Neck supple.  Cardiovascular: Normal rate, regular rhythm, normal heart sounds and intact distal pulses.   Pulmonary/Chest: Effort normal and breath sounds normal. No respiratory distress. No wheezes. No rales. Right chest surgical site without rash or palpable masses. Abdominal: Soft. Bowel sounds are normal. Exhibits no distension and no mass. There is no tenderness.  Musculoskeletal: Normal range of motion. Exhibits no edema.  Lymphadenopathy:    No cervical adenopathy.  Neurological: Alert and oriented to person, place, and time. Exhibits normal muscle tone. Gait normal. Coordination normal.  Skin: Skin is warm and dry. No rash noted. Not diaphoretic. No erythema. No pallor.  Psychiatric: Mood, memory and judgment normal.  Vitals reviewed.  LABORATORY DATA: Lab Results  Component Value Date   WBC 6.3 05/10/2017   HGB 10.1 (L) 05/10/2017   HCT 30.8 (L) 05/10/2017   MCV 88.7 05/10/2017   PLT 371 05/10/2017      Chemistry      Component Value  Date/Time   NA 142 05/10/2017 1134   K 3.5 05/10/2017 1134   CL 102 11/04/2016 1255   CO2 27 05/10/2017 1134   BUN 11.1 05/10/2017 1134   CREATININE 0.7 05/10/2017 1134      Component Value Date/Time   CALCIUM 9.0 05/10/2017 1134   ALKPHOS 79 05/10/2017 1134   AST 9 05/10/2017 1134   ALT 8 05/10/2017 1134   BILITOT 0.30 05/10/2017 1134       RADIOGRAPHIC STUDIES:  No results found.   ASSESSMENT/PLAN:  Adenocarcinoma of left lung, stage 2 ([facility]) This is a very pleasant 61 year old white female recently diagnosed with unresectable a stage IIB non-small cell lung cancer, adenocarcinoma. The patient is currently  undergoing a course of concurrent chemoradiation with weekly carboplatin and paclitaxel is status post 6cycles and has been tolerating this treatment fairly well with no significant adverse effects. The patient is now on immune therapy with Imfinzi given every 2 weeks.  She has completed 2 cycles of her treatment and has tolerated it well.  Recommend that she proceed with cycle 3 today as scheduled.  For her cough, recommended that she use Delsym over-the-counter.  The patient will be seen back for follow-up in 2 weeks for evaluation prior to cycle 4 of her Imfinzi.  She was advised to call immediately if she has any concerning symptoms in the interval. The patient voices understanding of current disease status and treatment options and is in agreement with the current care plan.  Neuropathy For her neuropathic pain, we previously gave her gabapentin 300 mg 3 times a day advised her to use a lidocaine patch.  She did not find gabapentin to be helpful taking it 3 times a day so she decreased this to once a day.  She continues use a lidocaine patch intermittently with minimal relief.  She takes Percocet at night which is somewhat helpful but does not take this during the day due to drowsiness.  Discussed with Dr. Julien Nordmann.  Recommend referral to neurology for further management of her neuropathy.  I discussed with the patient that she may use over-the-counter anti-inflammatory such as Aleve twice a day.  She may continue to use the Percocet and lidocaine patches in the interim.    Orders Placed This Encounter  Procedures  . Ambulatory referral to Neurology    Referral Priority:   Routine    Referral Type:   Consultation    Referral Reason:   Specialty Services Required    Requested Specialty:   Neurology    Number of Visits Requested:   [place], [place], AGPCNP-BC, AOCNP 05/10/17

## 2017-05-10 NOTE — Patient Instructions (Signed)

## 2017-05-10 NOTE — Assessment & Plan Note (Signed)
For her neuropathic pain, we previously gave her gabapentin 300 mg 3 times a day advised her to use a lidocaine patch.  She did not find gabapentin to be helpful taking it 3 times a day so she decreased this to once a day.  She continues use a lidocaine patch intermittently with minimal relief.  She takes Percocet at night which is somewhat helpful but does not take this during the day due to drowsiness.  Discussed with Dr. Julien Nordmann.  Recommend referral to neurology for further management of her neuropathy.  I discussed with the patient that she may use over-the-counter anti-inflammatory such as Aleve twice a day.  She may continue to use the Percocet and lidocaine patches in the interim.

## 2017-05-10 NOTE — Assessment & Plan Note (Signed)
This is a very pleasant 61 year old white female recently diagnosed with unresectable a stage IIB non-small cell lung cancer, adenocarcinoma. The patient is currently undergoing a course of concurrent chemoradiation with weekly carboplatin and paclitaxel is status post 6cycles and has been tolerating this treatment fairly well with no significant adverse effects. The patient is now on immune therapy with Imfinzi given every 2 weeks.  She has completed 2 cycles of her treatment and has tolerated it well.  Recommend that she proceed with cycle 3 today as scheduled.  For her cough, recommended that she use Delsym over-the-counter.  The patient will be seen back for follow-up in 2 weeks for evaluation prior to cycle 4 of her Imfinzi.  She was advised to call immediately if she has any concerning symptoms in the interval. The patient voices understanding of current disease status and treatment options and is in agreement with the current care plan.

## 2017-05-11 ENCOUNTER — Encounter: Payer: Self-pay | Admitting: Neurology

## 2017-05-19 ENCOUNTER — Other Ambulatory Visit: Payer: Self-pay | Admitting: Family Medicine

## 2017-05-19 ENCOUNTER — Telehealth: Payer: Self-pay

## 2017-05-19 ENCOUNTER — Telehealth: Payer: Self-pay | Admitting: Family Medicine

## 2017-05-19 ENCOUNTER — Other Ambulatory Visit: Payer: Self-pay

## 2017-05-19 DIAGNOSIS — E039 Hypothyroidism, unspecified: Secondary | ICD-10-CM

## 2017-05-19 MED ORDER — LEVOTHYROXINE SODIUM 75 MCG PO TABS: 75.0000 ug | ORAL_TABLET | Freq: Every day | ORAL | 2 refills | Status: DC

## 2017-05-19 NOTE — Telephone Encounter (Signed)
I spoke with Dr. Doug Sou office regarding the thyroid medication refill.   They are working on it and will get in touch with the pt.

## 2017-05-19 NOTE — Telephone Encounter (Signed)
Please explain to Felicia Herrera that I did not receive TSH results, she has had 2 TSH in the past month and both in normal range.  [This should have been clarified during phone call, TSH lab is under a different provider].  Last TSH slightly lower than prior one but still in normal range.  Refills for Levothyroxine will be sent to her pharmacy.  Thanks, BJ

## 2017-05-19 NOTE — Telephone Encounter (Signed)
[facility] called wanting to know if Dr. [name] had ever prescribed thyroid medication for this pt.    I could not find where she had.   Probably needs as appt.   Pharmacist will let pt know.

## 2017-05-20 NOTE — Telephone Encounter (Signed)
Spoke with pt  Voiced understanding that her TSH labs are in normal range and is under different doctor. Pt is aware that her Thyroid medication was sent to her pharmacy.

## 2017-05-20 NOTE — Telephone Encounter (Signed)
Spoke with pt aware that her thyroid medication was sent to her pharmacy.

## 2017-05-25 ENCOUNTER — Ambulatory Visit (HOSPITAL_BASED_OUTPATIENT_CLINIC_OR_DEPARTMENT_OTHER): Payer: Medicare Other

## 2017-05-25 ENCOUNTER — Ambulatory Visit (HOSPITAL_BASED_OUTPATIENT_CLINIC_OR_DEPARTMENT_OTHER): Payer: Medicare Other | Admitting: Oncology

## 2017-05-25 ENCOUNTER — Encounter: Payer: Self-pay | Admitting: Oncology

## 2017-05-25 ENCOUNTER — Other Ambulatory Visit (HOSPITAL_BASED_OUTPATIENT_CLINIC_OR_DEPARTMENT_OTHER): Payer: Medicare Other

## 2017-05-25 ENCOUNTER — Ambulatory Visit: Payer: Medicare Other

## 2017-05-25 VITALS — BP 128/69 | HR 92 | Temp 98.3°F | Resp 19 | Ht 62.0 in | Wt 145.8 lb

## 2017-05-25 DIAGNOSIS — C3492 Malignant neoplasm of unspecified part of left bronchus or lung: Secondary | ICD-10-CM

## 2017-05-25 DIAGNOSIS — C3411 Malignant neoplasm of upper lobe, right bronchus or lung: Secondary | ICD-10-CM

## 2017-05-25 DIAGNOSIS — D709 Neutropenia, unspecified: Secondary | ICD-10-CM | POA: Diagnosis not present

## 2017-05-25 DIAGNOSIS — Z5112 Encounter for antineoplastic immunotherapy: Secondary | ICD-10-CM

## 2017-05-25 DIAGNOSIS — Z95828 Presence of other vascular implants and grafts: Secondary | ICD-10-CM | POA: Insufficient documentation

## 2017-05-25 LAB — CBC WITH DIFFERENTIAL/PLATELET
BASO%: 0.2 % (ref 0.0–2.0)
BASOS ABS: 0 10*3/uL (ref 0.0–0.1)
EOS%: 1.2 % (ref 0.0–7.0)
Eosinophils Absolute: 0.1 10*3/uL (ref 0.0–0.5)
HCT: 33.9 % — ABNORMAL LOW (ref 34.8–46.6)
HEMOGLOBIN: 10.8 g/dL — AB (ref 11.6–15.9)
LYMPH%: 5.9 % — ABNORMAL LOW (ref 14.0–49.7)
MCH: 28.3 pg (ref 25.1–34.0)
MCHC: 31.9 g/dL (ref 31.5–36.0)
MCV: 89 fL (ref 79.5–101.0)
MONO#: 0.8 10*3/uL (ref 0.1–0.9)
MONO%: 8.8 % (ref 0.0–14.0)
NEUT#: 7.5 10*3/uL — ABNORMAL HIGH (ref 1.5–6.5)
NEUT%: 83.9 % — AB (ref 38.4–76.8)
Platelets: 485 10*3/uL — ABNORMAL HIGH (ref 145–400)
RBC: 3.81 10*6/uL (ref 3.70–5.45)
RDW: 14.1 % (ref 11.2–14.5)
WBC: 8.9 10*3/uL (ref 3.9–10.3)
lymph#: 0.5 10*3/uL — ABNORMAL LOW (ref 0.9–3.3)

## 2017-05-25 LAB — COMPREHENSIVE METABOLIC PANEL
ALT: 9 U/L (ref 0–55)
AST: 12 U/L (ref 5–34)
Albumin: 3.1 g/dL — ABNORMAL LOW (ref 3.5–5.0)
Alkaline Phosphatase: 90 U/L (ref 40–150)
Anion Gap: 10 mEq/L (ref 3–11)
BUN: 13.8 mg/dL (ref 7.0–26.0)
CALCIUM: 9.2 mg/dL (ref 8.4–10.4)
CHLORIDE: 105 meq/L (ref 98–109)
CO2: 24 meq/L (ref 22–29)
CREATININE: 0.7 mg/dL (ref 0.6–1.1)
EGFR: >60 mL/min/{1.73_m2} (ref >60)
GLUCOSE: 100 mg/dL (ref 70–140)
Potassium: 4.2 mEq/L (ref 3.5–5.1)
SODIUM: 139 meq/L (ref 136–145)
Total Bilirubin: 0.3 mg/dL (ref 0.20–1.20)
Total Protein: 7.5 g/dL (ref 6.4–8.3)

## 2017-05-25 MED ORDER — HEPARIN SOD (PORK) LOCK FLUSH 100 UNIT/ML IV SOLN
500.0000 [IU] | Freq: Once | INTRAVENOUS | Status: AC | PRN
  Administered 2017-05-25: 500 [IU]
  Filled 2017-05-25: qty 5

## 2017-05-25 MED ORDER — OXYCODONE-ACETAMINOPHEN 5-325 MG PO TABS: 1.0000 | ORAL_TABLET | Freq: Four times a day (QID) | ORAL | 0 refills | Status: DC | PRN

## 2017-05-25 MED ORDER — SODIUM CHLORIDE 0.9 % IV SOLN
Freq: Once | INTRAVENOUS | Status: AC
  Administered 2017-05-25: 15:00:00 via INTRAVENOUS

## 2017-05-25 MED ORDER — SODIUM CHLORIDE 0.9% FLUSH
10.0000 mL | Freq: Once | INTRAVENOUS | Status: AC
  Administered 2017-05-25: 10 mL
  Filled 2017-05-25: qty 10

## 2017-05-25 MED ORDER — SODIUM CHLORIDE 0.9 % IV SOLN
740.0000 mg | Freq: Once | INTRAVENOUS | Status: AC
  Administered 2017-05-25: 740 mg via INTRAVENOUS
  Filled 2017-05-25: qty 10

## 2017-05-25 MED ORDER — SODIUM CHLORIDE 0.9% FLUSH
10.0000 mL | INTRAVENOUS | Status: DC | PRN
  Administered 2017-05-25: 10 mL
  Filled 2017-05-25: qty 10

## 2017-05-25 NOTE — Progress Notes (Signed)
[facility] OFFICE PROGRESS NOTE  Felicia, [name], MD [place] [address]  DIAGNOSIS: Stage IIB(T2b, N1, M0) non-small cell lung cancer, adenocarcinoma presented with large left upper lobe lung mass in addition to left hilar lymphadenopathy diagnosed in April 2018. Her previous workup was done at [facility] in [facility]. There was insufficient material to perform the molecular studies.  GUARDANT 360 Molecular studies:BRCA2 N39f. Negative for EGFR, ALK, ROS1, BRAF mutations.  PRIOR THERAPY: A course of concurrent chemoradiation with weekly carboplatin for AUC of 2 and paclitaxel 45 MG/M2. First dose 01/24/2017.Status post 6cycles.  CURRENT THERAPY: Consolidation therapy with Imfinzi given every 2 weeks. First dose 04/13/2017.  INTERVAL HISTORY: Felicia Ciaravino89y.o. female returns for routine follow-up visit by herself.  Patient is feeling fine today except for ongoing neuropathic pain to her right axilla from prior surgery.  She is requesting a refill on Percocet today.  She has stopped taking the gabapentin that was prescribed to her and has stopped using the lidocaine patches since they were not helping.  The patient takes Percocet at bedtime only.  The patient has an appointment with neurology pending.  The patient denies fevers and chills.  Denies chest pain, shortness breath, cough, hemoptysis.  Denies nausea, vomiting, constipation, diarrhea.  The patient is here for evaluation prior to cycle #4 of her immunotherapy.  MEDICAL HISTORY: Past Medical History:  Diagnosis Date  . [place], [place] 12/27[place]  . Cancer ([facility]    Lun cancer: Right Dx 2008, s/p lobectomy. Left Dx 09/2016  . COPD (chronic obstructive pulmonary disease) ([facility]   . Encounter for antineoplastic chemotherapy 12/27/2016  . History of radiation therapy 01/24/17-03/08/17   left lung 2 Gy in 30 fractions  . Hyperlipidemia   .  Hypertension   . Stroke ([facility]     ALLERGIES:  has No Known Allergies.  MEDICATIONS:  Current Outpatient Medications  Medication Sig Dispense Refill  . aspirin EC 81 MG tablet Take 81 mg by mouth daily.    .Marland Kitchenatorvastatin (LIPITOR) 40 MG tablet Take 1 tablet (40 mg total) by mouth daily. 90 tablet 1  . Dextromethorphan HBr (COUGH SUPPRESSANT PO) Take by mouth.    . fluticasone (FLONASE) 50 MCG/ACT nasal spray Place 1 spray into both nostrils 2 (two) times daily. 16 Herrera 1  . fluticasone furoate-vilanterol (BREO ELLIPTA) 200-25 MCG/INH AEPB Inhale 1 puff into the lungs daily. 60 each 5  . gabapentin (NEURONTIN) 300 MG capsule Take 1 capsule (300 mg total) by mouth 3 (three) times daily. (Patient not taking: Reported on 05/25/2017) 90 capsule 0  . levothyroxine (SYNTHROID, LEVOTHROID) 75 MCG tablet Take 1 tablet (75 mcg total) by mouth daily before breakfast. 90 tablet 2  . lidocaine-prilocaine (EMLA) cream Apply 1 application topically as needed. (Patient not taking: Reported on 03/07/2017) 30 Herrera 0  . omeprazole (PRILOSEC) 20 MG capsule Take 20 mg by mouth daily.    .Marland KitchenoxyCODONE-acetaminophen (PERCOCET/ROXICET) 5-325 MG tablet Take 1 tablet by mouth every 6 (six) hours as needed for severe pain. 30 tablet 0  . prochlorperazine (COMPAZINE) 10 MG tablet Take 1 tablet (10 mg total) by mouth every 6 (six) hours as needed for nausea or vomiting. 30 tablet 0   No current facility-administered medications for this visit.     SURGICAL HISTORY:  Past Surgical History:  Procedure Laterality Date  . BREAST BIOPSY     several. Denies Hx of breast  cancer.  . IR FLUORO GUIDE PORT INSERTION RIGHT  02/04/2017  . IR US GUIDE VASC ACCESS RIGHT  02/04/2017  . THORACOTOMY/LOBECTOMY Right 2008  . TONSILLECTOMY  1960    REVIEW OF SYSTEMS:   Review of Systems  Constitutional: Negative for appetite change, chills, fatigue, fever and unexpected weight change.  HENT:   Negative for mouth sores, nosebleeds, sore  throat and trouble swallowing.   Eyes: Negative for eye problems and icterus.  Respiratory: Negative for cough, hemoptysis, shortness of breath and wheezing.   Cardiovascular: Negative for chest pain and leg swelling.  Gastrointestinal: Negative for abdominal pain, constipation, diarrhea, nausea and vomiting.  Genitourinary: Negative for bladder incontinence, difficulty urinating, dysuria, frequency and hematuria.   Musculoskeletal: Negative for back pain, gait problem, neck pain and neck stiffness.  Skin: Negative for itching and rash.  Neurological: Negative for dizziness, extremity weakness, gait problem, headaches, light-headedness and seizures. Positive for neuropathic pain to her right axillary area at her prior surgical site. Hematological: Negative for adenopathy. Does not bruise/bleed easily.  Psychiatric/Behavioral: Negative for confusion, depression and sleep disturbance. The patient is not nervous/anxious.     PHYSICAL EXAMINATION:  Blood pressure 128/69, pulse 92, temperature 98.3 F (36.8 C), temperature source Oral, resp. rate 19, height '5\' 2"'$  (1.575 m), weight 145 lb 12.8 oz (66.1 kg), SpO2 99 %.  ECOG PERFORMANCE STATUS: 1 - Symptomatic but completely ambulatory  Physical Exam  Constitutional: Oriented to person, place, and time and well-developed, well-nourished, and in no distress. No distress.  HENT:  Head: Normocephalic and atraumatic.  Mouth/Throat: Oropharynx is clear and moist. No oropharyngeal exudate.  Eyes: Conjunctivae are normal. Right eye exhibits no discharge. Left eye exhibits no discharge. No scleral icterus.  Neck: Normal range of motion. Neck supple.  Cardiovascular: Normal rate, regular rhythm, normal heart sounds and intact distal pulses.   Pulmonary/Chest: Effort normal and breath sounds normal. No respiratory distress. No wheezes. No rales.  Abdominal: Soft. Bowel sounds are normal. Exhibits no distension and no mass. There is no tenderness.   Musculoskeletal: Normal range of motion. Exhibits no edema.  Lymphadenopathy:    No cervical adenopathy.  Neurological: Alert and oriented to person, place, and time. Exhibits normal muscle tone. Gait normal. Coordination normal.  Skin: Skin is warm and dry. No rash noted. Not diaphoretic. No erythema. No pallor.  Psychiatric: Mood, memory and judgment normal.  Vitals reviewed.  LABORATORY DATA: Lab Results  Component Value Date   WBC 8.9 05/25/2017   HGB 10.8 (L) 05/25/2017   HCT 33.9 (L) 05/25/2017   MCV 89.0 05/25/2017   PLT 485 (H) 05/25/2017      Chemistry      Component Value Date/Time   NA 139 05/25/2017 1415   K 4.2 05/25/2017 1415   CL 102 11/04/2016 1255   CO2 24 05/25/2017 1415   BUN 13.8 05/25/2017 1415   CREATININE 0.7 05/25/2017 1415      Component Value Date/Time   CALCIUM 9.2 05/25/2017 1415   ALKPHOS 90 05/25/2017 1415   AST 12 05/25/2017 1415   ALT 9 05/25/2017 1415   BILITOT 0.30 05/25/2017 1415       RADIOGRAPHIC STUDIES:  No results found.   ASSESSMENT/PLAN:  Lung cancer [facility]) This is a very pleasant 61 year old white female recently diagnosed with unresectable a stage IIB non-small cell lung cancer, adenocarcinoma. The patient is currently undergoing a course of concurrent chemoradiation with weekly carboplatin and paclitaxel is status post 6cycles and has been  tolerating this treatment fairly well with no significant adverse effects. The patient is now on immune therapy with Imfinzi given every 2 weeks.  She has completed 3 cycles of her treatment and has tolerated it well.  Recommend that she proceed with cycle 4 today as scheduled.  The patient will be seen back for follow-up in 2 weeks for evaluation prior to cycle 5 of her Imfinzi.  For the neuropathic pain, a prescription for Percocet was refilled for her today.  I have again encouraged her to use the lidocaine patches.  She has an appointment with neurology pending.  She was  advised to call immediately if she has any concerning symptoms in the interval. The patient voices understanding of current disease status and treatment options and is in agreement with the current care plan.  Orders Placed This Encounter  Procedures  . Comprehensive metabolic panel    Standing Status:   Future    Number of Occurrences:   1    Standing Expiration Date:   05/25/2018  . Comprehensive metabolic panel    Standing Status:   Standing    Number of Occurrences:   10    Standing Expiration Date:   05/26/2018    Mikey Bussing, DNP, AGPCNP-BC, AOCNP 05/26/17

## 2017-05-25 NOTE — Patient Instructions (Signed)
[facility] Discharge Instructions for Patients Receiving Chemotherapy  Today you received the following chemotherapy agents: Imfinzi.  To help prevent nausea and vomiting after your treatment, we encourage you to take your nausea medication as directed.   If you develop nausea and vomiting that is not controlled by your nausea medication, call the clinic.   BELOW ARE SYMPTOMS THAT SHOULD BE REPORTED IMMEDIATELY:  *FEVER GREATER THAN 100.5 F  *CHILLS WITH OR WITHOUT FEVER  NAUSEA AND VOMITING THAT IS NOT CONTROLLED WITH YOUR NAUSEA MEDICATION  *UNUSUAL SHORTNESS OF BREATH  *UNUSUAL BRUISING OR BLEEDING  TENDERNESS IN MOUTH AND THROAT WITH OR WITHOUT PRESENCE OF ULCERS  *URINARY PROBLEMS  *BOWEL PROBLEMS  UNUSUAL RASH Items with * indicate a potential emergency and should be followed up as soon as possible.  Feel free to call the clinic should you have any questions or concerns. The clinic phone number is [phone].  Please show the CHEMO ALERT CARD at check-in to the Emergency Department and triage nurse.   

## 2017-05-26 ENCOUNTER — Encounter: Payer: Self-pay | Admitting: Oncology

## 2017-05-26 NOTE — Assessment & Plan Note (Signed)
This is a very pleasant 61 year old white female recently diagnosed with unresectable a stage IIB non-small cell lung cancer, adenocarcinoma. The patient is currently undergoing a course of concurrent chemoradiation with weekly carboplatin and paclitaxel is status post 6cycles and has been tolerating this treatment fairly well with no significant adverse effects. The patient is now on immune therapy with Imfinzi given every 2 weeks.  She has completed 3 cycles of her treatment and has tolerated it well.  Recommend that she proceed with cycle 4 today as scheduled.  The patient will be seen back for follow-up in 2 weeks for evaluation prior to cycle 5 of her Imfinzi.  For the neuropathic pain, a prescription for Percocet was refilled for her today.  I have again encouraged her to use the lidocaine patches.  She has an appointment with neurology pending.  She was advised to call immediately if she has any concerning symptoms in the interval. The patient voices understanding of current disease status and treatment options and is in agreement with the current care plan.

## 2017-06-08 ENCOUNTER — Ambulatory Visit: Payer: Medicare Other

## 2017-06-08 ENCOUNTER — Ambulatory Visit (HOSPITAL_BASED_OUTPATIENT_CLINIC_OR_DEPARTMENT_OTHER): Payer: Medicare Other | Admitting: Internal Medicine

## 2017-06-08 ENCOUNTER — Ambulatory Visit (HOSPITAL_BASED_OUTPATIENT_CLINIC_OR_DEPARTMENT_OTHER): Payer: Medicare Other

## 2017-06-08 ENCOUNTER — Telehealth: Payer: Self-pay | Admitting: Internal Medicine

## 2017-06-08 ENCOUNTER — Other Ambulatory Visit (HOSPITAL_BASED_OUTPATIENT_CLINIC_OR_DEPARTMENT_OTHER): Payer: Medicare Other

## 2017-06-08 ENCOUNTER — Encounter: Payer: Self-pay | Admitting: Internal Medicine

## 2017-06-08 VITALS — BP 145/76 | HR 94 | Temp 98.4°F | Resp 18 | Ht 62.0 in | Wt 143.3 lb

## 2017-06-08 DIAGNOSIS — Z5112 Encounter for antineoplastic immunotherapy: Secondary | ICD-10-CM | POA: Diagnosis present

## 2017-06-08 DIAGNOSIS — C3411 Malignant neoplasm of upper lobe, right bronchus or lung: Secondary | ICD-10-CM

## 2017-06-08 DIAGNOSIS — C3492 Malignant neoplasm of unspecified part of left bronchus or lung: Secondary | ICD-10-CM

## 2017-06-08 DIAGNOSIS — Z95828 Presence of other vascular implants and grafts: Secondary | ICD-10-CM

## 2017-06-08 DIAGNOSIS — R5383 Other fatigue: Secondary | ICD-10-CM

## 2017-06-08 DIAGNOSIS — I10 Essential (primary) hypertension: Secondary | ICD-10-CM | POA: Diagnosis not present

## 2017-06-08 LAB — CBC WITH DIFFERENTIAL/PLATELET
BASO%: 0.6 % (ref 0.0–2.0)
BASOS ABS: 0 10*3/uL (ref 0.0–0.1)
EOS ABS: 0.1 10*3/uL (ref 0.0–0.5)
EOS%: 0.8 % (ref 0.0–7.0)
HCT: 33.7 % — ABNORMAL LOW (ref 34.8–46.6)
HGB: 11 g/dL — ABNORMAL LOW (ref 11.6–15.9)
LYMPH%: 7.2 % — AB (ref 14.0–49.7)
MCH: 27.8 pg (ref 25.1–34.0)
MCHC: 32.7 g/dL (ref 31.5–36.0)
MCV: 85.1 fL (ref 79.5–101.0)
MONO#: 0.4 10*3/uL (ref 0.1–0.9)
MONO%: 5 % (ref 0.0–14.0)
NEUT#: 6.7 10*3/uL — ABNORMAL HIGH (ref 1.5–6.5)
NEUT%: 86.4 % — AB (ref 38.4–76.8)
PLATELETS: 431 10*3/uL — AB (ref 145–400)
RBC: 3.96 10*6/uL (ref 3.70–5.45)
RDW: 15.3 % — ABNORMAL HIGH (ref 11.2–14.5)
WBC: 7.8 10*3/uL (ref 3.9–10.3)
lymph#: 0.6 10*3/uL — ABNORMAL LOW (ref 0.9–3.3)

## 2017-06-08 LAB — COMPREHENSIVE METABOLIC PANEL
ALT: 8 U/L (ref 0–55)
ANION GAP: 11 meq/L (ref 3–11)
AST: 8 U/L (ref 5–34)
Albumin: 3.3 g/dL — ABNORMAL LOW (ref 3.5–5.0)
Alkaline Phosphatase: 107 U/L (ref 40–150)
BILIRUBIN TOTAL: 0.43 mg/dL (ref 0.20–1.20)
BUN: 14 mg/dL (ref 7.0–26.0)
CO2: 25 meq/L (ref 22–29)
Calcium: 9.3 mg/dL (ref 8.4–10.4)
Chloride: 103 mEq/L (ref 98–109)
Creatinine: 0.7 mg/dL (ref 0.6–1.1)
Glucose: 132 mg/dl (ref 70–140)
POTASSIUM: 3.3 meq/L — AB (ref 3.5–5.1)
Sodium: 139 mEq/L (ref 136–145)
TOTAL PROTEIN: 7.4 g/dL (ref 6.4–8.3)

## 2017-06-08 LAB — TSH: TSH: 1.023 m[IU]/L (ref 0.308–3.960)

## 2017-06-08 MED ORDER — SODIUM CHLORIDE 0.9% FLUSH
10.0000 mL | Freq: Once | INTRAVENOUS | Status: AC
  Administered 2017-06-08: 10 mL
  Filled 2017-06-08: qty 10

## 2017-06-08 MED ORDER — DURVALUMAB 500 MG/10ML IV SOLN
9.2000 mg/kg | Freq: Once | INTRAVENOUS | Status: AC
  Administered 2017-06-08: 620 mg via INTRAVENOUS
  Filled 2017-06-08: qty 10

## 2017-06-08 MED ORDER — SODIUM CHLORIDE 0.9% FLUSH
10.0000 mL | INTRAVENOUS | Status: DC | PRN
  Administered 2017-06-08: 10 mL
  Filled 2017-06-08: qty 10

## 2017-06-08 MED ORDER — SODIUM CHLORIDE 0.9 % IV SOLN
Freq: Once | INTRAVENOUS | Status: AC
  Administered 2017-06-08: 11:00:00 via INTRAVENOUS

## 2017-06-08 MED ORDER — HEPARIN SOD (PORK) LOCK FLUSH 100 UNIT/ML IV SOLN
500.0000 [IU] | Freq: Once | INTRAVENOUS | Status: AC | PRN
  Administered 2017-06-08: 500 [IU]
  Filled 2017-06-08: qty 5

## 2017-06-08 NOTE — Progress Notes (Signed)
[place] [phone]   [phone]  OFFICE PROGRESS NOTE  [name], [name], MD [place] [address]  DIAGNOSIS: Stage IIB (T2b, N1, M0) non-small cell lung cancer, adenocarcinoma presented with large left upper lobe lung mass in addition to left hilar lymphadenopathy diagnosed in April 2018. Her previous workup was done at [facility] in [facility]. There was insufficient material to perform the molecular studies.  GU[facility] 360 Molecular studies: BRCA2 N325f. Negative for EGFR, ALK, ROS1, BRAF mutations.  PRIOR THERAPY:  A course of concurrent chemoradiation with weekly carboplatin for AUC of 2 and paclitaxel 45 MG/M2. First dose 01/24/2017. Status post 6 cycles.  CURRENT THERAPY: Consolidation immunotherapy with Imfinzi (Durvalumab) 10 mg/KG every 2 weeks status post 4 cycles.  INTERVAL HISTORY: Felicia Prestia61y.o. female returns to the clinic today for follow-up visit.  The patient continues to tolerate the consolidation immunotherapy with Imfinzi (Durvalumab) fairly well.  She denied having any significant skin rash or diarrhea.  She continues to have intermittent pain on the right side of the chest at the surgical scar.  She was Percocet but only once at nighttime.  She denied having any fever or chills.  She has no nausea, vomiting, diarrhea or constipation.  She is here today for evaluation before starting cycle #5.  MEDICAL HISTORY: Past Medical History:  Diagnosis Date  . [place]en[place] 2 [place] [place]  . Cancer ([facility]    Lun cancer: Right Dx 2008, s/p lobectomy. Left Dx 09/2016  . [facility] [place] disease[place]   . Encounter for antineoplastic chemotherapy 12/27/2016  . History of radiation therapy 01/24/17-03/08/17   left lung 2 Gy in 30 fractions  . Hyperlipidemia   . Hypertension   . Stroke ([facility]     ALLERGIES:  has No Known  Allergies.  MEDICATIONS:  Current Outpatient Medications  Medication Sig Dispense Refill  . aspirin EC 81 MG tablet Take 81 mg by mouth daily.    .Marland Kitchenatorvastatin (LIPITOR) 40 MG tablet Take 1 tablet (40 mg total) by mouth daily. 90 tablet 1  . Dextromethorphan HBr (COUGH SUPPRESSANT PO) Take by mouth.    . fluticasone (FLONASE) 50 MCG/ACT nasal spray Place 1 spray into both nostrils 2 (two) times daily. 16 Herrera 1  . fluticasone furoate-vilanterol (BREO ELLIPTA) 200-25 MCG/INH AEPB Inhale 1 puff into the lungs daily. 60 each 5  . gabapentin (NEURONTIN) 300 MG capsule Take 1 capsule (300 mg total) by mouth 3 (three) times daily. (Patient not taking: Reported on 05/25/2017) 90 capsule 0  . levothyroxine (SYNTHROID, LEVOTHROID) 75 MCG tablet Take 1 tablet (75 mcg total) by mouth daily before breakfast. 90 tablet 2  . lidocaine-prilocaine (EMLA) cream Apply 1 application topically as needed. (Patient not taking: Reported on 03/07/2017) 30 Herrera 0  . omeprazole (PRILOSEC) 20 MG capsule Take 20 mg by mouth daily.    .Marland KitchenoxyCODONE-acetaminophen (PERCOCET/ROXICET) 5-325 MG tablet Take 1 tablet by mouth every 6 (six) hours as needed for severe pain. 30 tablet 0  . prochlorperazine (COMPAZINE) 10 MG tablet Take 1 tablet (10 mg total) by mouth every 6 (six) hours as needed for nausea or vomiting. 30 tablet 0   No current facility-administered medications for this visit.     SURGICAL HISTORY:  Past Surgical History:  Procedure Laterality Date  . BREAST BIOPSY     several. Denies Hx of breast cancer.  . IR  FLUORO GUIDE PORT INSERTION RIGHT  02/04/2017  . IR US GUIDE VASC ACCESS RIGHT  02/04/2017  . THORACOTOMY/LOBECTOMY Right 2008  . TONSILLECTOMY  1960    REVIEW OF SYSTEMS:  A comprehensive review of systems was negative except for: Respiratory: positive for pleurisy/chest pain   PHYSICAL EXAMINATION: General appearance: alert, cooperative and no distress Head: Normocephalic, without obvious abnormality,  atraumatic Neck: no adenopathy, no JVD, supple, symmetrical, trachea midline and thyroid not enlarged, symmetric, no tenderness/mass/nodules Lymph nodes: Cervical, supraclavicular, and axillary nodes normal. Resp: clear to auscultation bilaterally Back: symmetric, no curvature. ROM normal. No CVA tenderness. Cardio: regular rate and rhythm, S1, S2 normal, no murmur, click, rub or gallop GI: soft, non-tender; bowel sounds normal; no masses,  no organomegaly Extremities: extremities normal, atraumatic, no cyanosis or edema  ECOG PERFORMANCE STATUS: 1 - Symptomatic but completely ambulatory  Blood pressure (!) 145/76, pulse 94, temperature 98.4 F (36.9 C), temperature source Oral, resp. rate 18, height '5\' 2"'$  (1.575 m), weight 143 lb 4.8 oz (65 kg), SpO2 93 %.  LABORATORY DATA: Lab Results  Component Value Date   WBC 7.8 06/08/2017   HGB 11.0 (L) 06/08/2017   HCT 33.7 (L) 06/08/2017   MCV 85.1 06/08/2017   PLT 431 (H) 06/08/2017      Chemistry      Component Value Date/Time   NA 139 05/25/2017 1415   K 4.2 05/25/2017 1415   CL 102 11/04/2016 1255   CO2 24 05/25/2017 1415   BUN 13.8 05/25/2017 1415   CREATININE 0.7 05/25/2017 1415      Component Value Date/Time   CALCIUM 9.2 05/25/2017 1415   ALKPHOS 90 05/25/2017 1415   AST 12 05/25/2017 1415   ALT 9 05/25/2017 1415   BILITOT 0.30 05/25/2017 1415       RADIOGRAPHIC STUDIES: No results found.  ASSESSMENT AND PLAN: This is a very pleasant 61 years old white female recently diagnosed with unresectable a stage IIB non-small cell lung cancer, adenocarcinoma. The patient underwent a course of concurrent chemoradiation with weekly carboplatin and paclitaxel status post 6 cycles.  She tolerated the treatment well and had partial response. She is currently undergoing consolidation treatment with immunotherapy with Imfinzi (Durvalumab) status post 4 cycles and has been tolerating this treatment very well. I recommended for her to  proceed with cycle #5 today as a scheduled. For the right sided chest pain, she will continue on Percocet on as-needed basis but she use it mainly at nighttime at this point. The patient will come back for follow-up visit in 2 weeks for evaluation before starting cycle #6. She was advised to call immediately if she has any concerning symptoms in the interval. The patient voices understanding of current disease status and treatment options and is in agreement with the current care plan. All questions were answered. The patient knows to call the clinic with any problems, questions or concerns. We can certainly see the patient much sooner if necessary. I spent 10 minutes counseling the patient face to face. The total time spent in the appointment was 15 minutes.  Disclaimer: This note was dictated with voice recognition software. Similar sounding words can inadvertently be transcribed and may not be corrected upon review.

## 2017-06-08 NOTE — Telephone Encounter (Signed)
Scheduled appt per 12/19 los - Gave patient AVS and calender per los.

## 2017-06-08 NOTE — Patient Instructions (Signed)
[facility] Discharge Instructions for Patients Receiving Chemotherapy  Today you received the following chemotherapy agents Durvalumab.  To help prevent nausea and vomiting after your treatment, we encourage you to take your nausea medication as prescribed.  If you develop nausea and vomiting that is not controlled by your nausea medication, call the clinic.   BELOW ARE SYMPTOMS THAT SHOULD BE REPORTED IMMEDIATELY:  *FEVER GREATER THAN 100.5 F  *CHILLS WITH OR WITHOUT FEVER  NAUSEA AND VOMITING THAT IS NOT CONTROLLED WITH YOUR NAUSEA MEDICATION  *UNUSUAL SHORTNESS OF BREATH  *UNUSUAL BRUISING OR BLEEDING  TENDERNESS IN MOUTH AND THROAT WITH OR WITHOUT PRESENCE OF ULCERS  *URINARY PROBLEMS  *BOWEL PROBLEMS  UNUSUAL RASH Items with * indicate a potential emergency and should be followed up as soon as possible.  Feel free to call the clinic should you have any questions or concerns. The clinic phone number is [phone].  Please show the [place] at check-in to the Emergency Department and triage nurse.

## 2017-06-08 NOTE — Patient Instructions (Signed)
Implanted Port Home Guide An implanted port is a type of central line that is placed under the skin. Central lines are used to provide IV access when treatment or nutrition needs to be given through a person's veins. Implanted ports are used for long-term IV access. An implanted port may be placed because:  You need IV medicine that would be irritating to the small veins in your hands or arms.  You need long-term IV medicines, such as antibiotics.  You need IV nutrition for a long period.  You need frequent blood draws for lab tests.  You need dialysis.  Implanted ports are usually placed in the chest area, but they can also be placed in the upper arm, the abdomen, or the leg. An implanted port has two main parts:  Reservoir. The reservoir is round and will appear as a small, raised area under your skin. The reservoir is the part where a needle is inserted to give medicines or draw blood.  Catheter. The catheter is a thin, flexible tube that extends from the reservoir. The catheter is placed into a large vein. Medicine that is inserted into the reservoir goes into the catheter and then into the vein.  How will I care for my incision site? Do not get the incision site wet. Bathe or shower as directed by your health care provider. How is my port accessed? Special steps must be taken to access the port:  Before the port is accessed, a numbing cream can be placed on the skin. This helps numb the skin over the port site.  Your health care provider uses a sterile technique to access the port. ? Your health care provider must put on a mask and sterile gloves. ? The skin over your port is cleaned carefully with an antiseptic and allowed to dry. ? The port is gently pinched between sterile gloves, and a needle is inserted into the port.  Only "non-coring" port needles should be used to access the port. Once the port is accessed, a blood return should be checked. This helps ensure that the port  is in the vein and is not clogged.  If your port needs to remain accessed for a constant infusion, a clear (transparent) bandage will be placed over the needle site. The bandage and needle will need to be changed every week, or as directed by your health care provider.  Keep the bandage covering the needle clean and dry. Do not get it wet. Follow your health care provider's instructions on how to take a shower or bath while the port is accessed.  If your port does not need to stay accessed, no bandage is needed over the port.  What is flushing? Flushing helps keep the port from getting clogged. Follow your health care provider's instructions on how and when to flush the port. Ports are usually flushed with saline solution or a medicine called heparin. The need for flushing will depend on how the port is used.  If the port is used for intermittent medicines or blood draws, the port will need to be flushed: ? After medicines have been given. ? After blood has been drawn. ? As part of routine maintenance.  If a constant infusion is running, the port may not need to be flushed.  How long will my port stay implanted? The port can stay in for as long as your health care provider thinks it is needed. When it is time for the port to come out, surgery will be   done to remove it. The procedure is similar to the one performed when the port was put in. When should I seek immediate medical care? When you have an implanted port, you should seek immediate medical care if:  You notice a bad smell coming from the incision site.  You have swelling, redness, or drainage at the incision site.  You have more swelling or pain at the port site or the surrounding area.  You have a fever that is not controlled with medicine.  This information is not intended to replace advice given to you by your health care provider. Make sure you discuss any questions you have with your health care provider. Document  Released: 06/07/2005 Document Revised: 11/13/2015 Document Reviewed: 02/12/2013 Elsevier Interactive Patient Education  2017 [employer].  

## 2017-06-10 ENCOUNTER — Telehealth: Payer: Self-pay | Admitting: *Deleted

## 2017-06-10 NOTE — Telephone Encounter (Signed)
TCT patient-left vm message regarding pt's K+ level of 3.3 from 06/08/17. Advised pt to increase K+ with dietary supplements-food choices rich in potassium. Gave list of appropriate foods. And our call back # if she had any questions.

## 2017-06-22 ENCOUNTER — Other Ambulatory Visit (HOSPITAL_BASED_OUTPATIENT_CLINIC_OR_DEPARTMENT_OTHER): Payer: Medicare Other

## 2017-06-22 ENCOUNTER — Encounter: Payer: Self-pay | Admitting: Nurse Practitioner

## 2017-06-22 ENCOUNTER — Inpatient Hospital Stay: Payer: Medicare Other | Attending: Nurse Practitioner | Admitting: Nurse Practitioner

## 2017-06-22 ENCOUNTER — Ambulatory Visit (HOSPITAL_BASED_OUTPATIENT_CLINIC_OR_DEPARTMENT_OTHER): Payer: Medicare Other

## 2017-06-22 ENCOUNTER — Ambulatory Visit: Payer: Medicare Other

## 2017-06-22 VITALS — BP 117/63 | HR 95 | Temp 98.3°F | Resp 17 | Ht 62.0 in | Wt 143.0 lb

## 2017-06-22 DIAGNOSIS — Z5112 Encounter for antineoplastic immunotherapy: Secondary | ICD-10-CM

## 2017-06-22 DIAGNOSIS — C3411 Malignant neoplasm of upper lobe, right bronchus or lung: Secondary | ICD-10-CM

## 2017-06-22 DIAGNOSIS — G629 Polyneuropathy, unspecified: Secondary | ICD-10-CM | POA: Insufficient documentation

## 2017-06-22 DIAGNOSIS — R05 Cough: Secondary | ICD-10-CM | POA: Insufficient documentation

## 2017-06-22 DIAGNOSIS — Z9221 Personal history of antineoplastic chemotherapy: Secondary | ICD-10-CM | POA: Insufficient documentation

## 2017-06-22 DIAGNOSIS — C3432 Malignant neoplasm of lower lobe, left bronchus or lung: Secondary | ICD-10-CM | POA: Insufficient documentation

## 2017-06-22 DIAGNOSIS — C3492 Malignant neoplasm of unspecified part of left bronchus or lung: Secondary | ICD-10-CM

## 2017-06-22 DIAGNOSIS — Z95828 Presence of other vascular implants and grafts: Secondary | ICD-10-CM

## 2017-06-22 DIAGNOSIS — Z923 Personal history of irradiation: Secondary | ICD-10-CM | POA: Insufficient documentation

## 2017-06-22 DIAGNOSIS — R0789 Other chest pain: Secondary | ICD-10-CM | POA: Insufficient documentation

## 2017-06-22 DIAGNOSIS — Z79899 Other long term (current) drug therapy: Secondary | ICD-10-CM | POA: Insufficient documentation

## 2017-06-22 LAB — COMPREHENSIVE METABOLIC PANEL
ALT: 15 U/L (ref 0–55)
AST: 12 U/L (ref 5–34)
Albumin: 3.6 g/dL (ref 3.5–5.0)
Alkaline Phosphatase: 93 U/L (ref 40–150)
Anion Gap: 10 mEq/L (ref 3–11)
BILIRUBIN TOTAL: 0.25 mg/dL (ref 0.20–1.20)
BUN: 13.6 mg/dL (ref 7.0–26.0)
CHLORIDE: 106 meq/L (ref 98–109)
CO2: 26 meq/L (ref 22–29)
Calcium: 9.4 mg/dL (ref 8.4–10.4)
Creatinine: 0.7 mg/dL (ref 0.6–1.1)
Glucose: 100 mg/dl (ref 70–140)
POTASSIUM: 3.9 meq/L (ref 3.5–5.1)
SODIUM: 142 meq/L (ref 136–145)
TOTAL PROTEIN: 7.5 g/dL (ref 6.4–8.3)

## 2017-06-22 LAB — CBC WITH DIFFERENTIAL/PLATELET
BASO%: 0.7 % (ref 0.0–2.0)
BASOS ABS: 0.1 10*3/uL (ref 0.0–0.1)
EOS%: 1.3 % (ref 0.0–7.0)
Eosinophils Absolute: 0.1 10*3/uL (ref 0.0–0.5)
HCT: 35.5 % (ref 34.8–46.6)
HGB: 11.5 g/dL — ABNORMAL LOW (ref 11.6–15.9)
LYMPH%: 7.8 % — AB (ref 14.0–49.7)
MCH: 27.1 pg (ref 25.1–34.0)
MCHC: 32.4 g/dL (ref 31.5–36.0)
MCV: 83.5 fL (ref 79.5–101.0)
MONO#: 0.6 10*3/uL (ref 0.1–0.9)
MONO%: 7.8 % (ref 0.0–14.0)
NEUT#: 6.9 10*3/uL — ABNORMAL HIGH (ref 1.5–6.5)
NEUT%: 82.4 % — AB (ref 38.4–76.8)
Platelets: 332 10*3/uL (ref 145–400)
RBC: 4.25 10*6/uL (ref 3.70–5.45)
RDW: 14.8 % — ABNORMAL HIGH (ref 11.2–14.5)
WBC: 8.3 10*3/uL (ref 3.9–10.3)
lymph#: 0.7 10*3/uL — ABNORMAL LOW (ref 0.9–3.3)

## 2017-06-22 MED ORDER — SODIUM CHLORIDE 0.9 % IV SOLN
Freq: Once | INTRAVENOUS | Status: AC
  Administered 2017-06-22: 16:00:00 via INTRAVENOUS

## 2017-06-22 MED ORDER — SODIUM CHLORIDE 0.9 % IV SOLN
9.2000 mg/kg | Freq: Once | INTRAVENOUS | Status: AC
  Administered 2017-06-22: 620 mg via INTRAVENOUS
  Filled 2017-06-22: qty 10

## 2017-06-22 MED ORDER — OXYCODONE-ACETAMINOPHEN 5-325 MG PO TABS: 1.0000 | ORAL_TABLET | Freq: Four times a day (QID) | ORAL | 0 refills | Status: DC | PRN

## 2017-06-22 MED ORDER — HEPARIN SOD (PORK) LOCK FLUSH 100 UNIT/ML IV SOLN
500.0000 [IU] | Freq: Once | INTRAVENOUS | Status: AC | PRN
  Administered 2017-06-22: 500 [IU]
  Filled 2017-06-22: qty 5

## 2017-06-22 MED ORDER — SODIUM CHLORIDE 0.9% FLUSH
10.0000 mL | INTRAVENOUS | Status: AC | PRN
  Administered 2017-06-22: 10 mL
  Filled 2017-06-22: qty 10

## 2017-06-22 MED ORDER — SODIUM CHLORIDE 0.9% FLUSH
10.0000 mL | Freq: Once | INTRAVENOUS | Status: AC
  Administered 2017-06-22: 10 mL
  Filled 2017-06-22: qty 10

## 2017-06-22 NOTE — Progress Notes (Signed)
  [place] OFFICE PROGRESS NOTE   DIAGNOSIS: Stage IIB(T2b, N1, M0) non-small cell lung cancer, adenocarcinoma presented with large left upper lobe lung mass in addition to left hilar lymphadenopathy diagnosed in April 2018. Her previous workup was done at [facility] in [facility]. There was insufficient material to perform the molecular studies.  GUARD[facility] 360 Molecular studies: BRCA2 N355f. Negative for EGFR, ALK, ROS1, BRAF mutations.  PRIOR THERAPY:  A course of concurrent chemoradiation with weekly carboplatin for AUC of 2 and paclitaxel 45 MG/M2. First dose 01/24/2017. Status post 6 cycles.  CURRENT THERAPY: Consolidation immunotherapy with Imfinzi (Durvalumab) 10 mg/KG every 2 weeks status post 5 cycles.     INTERVAL HISTORY:   Ms. MJaegerreturns as scheduled.  She completed cycle 5 Imfinzi 06/08/2017.  She denies nausea/vomiting.  No mouth sores.  No diarrhea.  No rash.  She has a persistent cough, may be slightly worse over the past few days.  No fever or shortness of breath.  She continues to have pain at the right chest.  She takes Percocet as needed.  Objective:  Vital signs in last 24 hours:  Blood pressure 117/63, pulse 95, temperature 98.3 F (36.8 C), temperature source Oral, resp. rate 17, height '5\' 2"'$  (1.575 m), weight 143 lb (64.9 kg), SpO2 97 %.    HEENT: No thrush or ulcers. Resp: Lungs clear bilaterally. Cardio: Regular rate and rhythm. GI: Abdomen soft and nontender.  No hepatomegaly. Vascular: No leg edema. Neuro: Alert and oriented. Skin: No rash. Port-A-Cath without erythema.   Lab Results:  Lab Results  Component Value Date   WBC 8.3 06/22/2017   HGB 11.5 (L) 06/22/2017   HCT 35.5 06/22/2017   MCV 83.5 06/22/2017   PLT 332 06/22/2017   NEUTROABS 6.9 (H) 06/22/2017    Imaging:  No results found.  Medications: I have reviewed the patient's current medications.  Assessment/Plan: 1. Unresectable  stage IIB non-small cell lung cancer, adenocarcinoma, status post concurrent chemoradiation with weekly carboplatin/Taxol x 7 cycles; initiation of consolidation Imfinzi 04/13/2017. 2. Pain right chest wall.  She takes Percocet as needed.   Disposition: Ms. MStradaappears stable.  She is currently receiving consolidation immunotherapy with imfinzi.  She has completed 5 cycles.  Plan to proceed with cycle 6 today as scheduled.  She will have a restaging CT scan of the chest prior to her next visit in 2 weeks.  She will return for follow-up on 07/06/2017.  She will contact the office in the interim with any problems.    LNed CardANP/GNP-BC   06/22/2017  3:26 PM

## 2017-06-22 NOTE — Patient Instructions (Signed)
[facility] Discharge Instructions for Patients Receiving Chemotherapy  Today you received the following chemotherapy agents Durvalumab  To help prevent nausea and vomiting after your treatment, we encourage you to take your nausea medication as directed    If you develop nausea and vomiting that is not controlled by your nausea medication, call the clinic.   BELOW ARE SYMPTOMS THAT SHOULD BE REPORTED IMMEDIATELY:  *FEVER GREATER THAN 100.5 F  *CHILLS WITH OR WITHOUT FEVER  NAUSEA AND VOMITING THAT IS NOT CONTROLLED WITH YOUR NAUSEA MEDICATION  *UNUSUAL SHORTNESS OF BREATH  *UNUSUAL BRUISING OR BLEEDING  TENDERNESS IN MOUTH AND THROAT WITH OR WITHOUT PRESENCE OF ULCERS  *URINARY PROBLEMS  *BOWEL PROBLEMS  UNUSUAL RASH Items with * indicate a potential emergency and should be followed up as soon as possible.  Feel free to call the clinic should you have any questions or concerns. The clinic phone number is [phone].  Please show the [place] at check-in to the Emergency Department and triage nurse.

## 2017-07-04 ENCOUNTER — Encounter (HOSPITAL_COMMUNITY): Payer: Self-pay | Admitting: Radiology

## 2017-07-04 ENCOUNTER — Ambulatory Visit (HOSPITAL_COMMUNITY)
Admission: RE | Admit: 2017-07-04 | Discharge: 2017-07-04 | Disposition: A | Discharge status: Home / Self Care | Payer: Medicare Other | Source: Ambulatory Visit | Attending: Nurse Practitioner | Admitting: Nurse Practitioner

## 2017-07-04 DIAGNOSIS — R918 Other nonspecific abnormal finding of lung field: Secondary | ICD-10-CM | POA: Insufficient documentation

## 2017-07-04 DIAGNOSIS — C3492 Malignant neoplasm of unspecified part of left bronchus or lung: Secondary | ICD-10-CM

## 2017-07-04 DIAGNOSIS — Z923 Personal history of irradiation: Secondary | ICD-10-CM | POA: Diagnosis not present

## 2017-07-04 DIAGNOSIS — C3490 Malignant neoplasm of unspecified part of unspecified bronchus or lung: Secondary | ICD-10-CM | POA: Diagnosis not present

## 2017-07-04 DIAGNOSIS — I700 Atherosclerosis of aorta: Secondary | ICD-10-CM | POA: Diagnosis not present

## 2017-07-04 DIAGNOSIS — C3412 Malignant neoplasm of upper lobe, left bronchus or lung: Secondary | ICD-10-CM | POA: Insufficient documentation

## 2017-07-04 DIAGNOSIS — Z902 Acquired absence of lung [part of]: Secondary | ICD-10-CM | POA: Diagnosis not present

## 2017-07-04 MED ORDER — HEPARIN SOD (PORK) LOCK FLUSH 100 UNIT/ML IV SOLN
INTRAVENOUS | Status: AC
  Filled 2017-07-04: qty 5

## 2017-07-04 MED ORDER — IOPAMIDOL (ISOVUE-300) INJECTION 61%
INTRAVENOUS | Status: AC
  Administered 2017-07-04: 75 mL via INTRAVENOUS
  Filled 2017-07-04: qty 75

## 2017-07-04 MED ORDER — HEPARIN SOD (PORK) LOCK FLUSH 100 UNIT/ML IV SOLN
500.0000 [IU] | Freq: Once | INTRAVENOUS | Status: AC
  Administered 2017-07-04: 500 [IU] via INTRAVENOUS

## 2017-07-04 MED ORDER — IOPAMIDOL (ISOVUE-300) INJECTION 61%
75.0000 mL | Freq: Once | INTRAVENOUS | Status: AC | PRN
  Administered 2017-07-04: 75 mL via INTRAVENOUS

## 2017-07-06 ENCOUNTER — Inpatient Hospital Stay: Payer: Medicare Other

## 2017-07-06 ENCOUNTER — Inpatient Hospital Stay (HOSPITAL_BASED_OUTPATIENT_CLINIC_OR_DEPARTMENT_OTHER): Payer: Medicare Other | Admitting: Oncology

## 2017-07-06 ENCOUNTER — Telehealth: Payer: Self-pay | Admitting: Oncology

## 2017-07-06 ENCOUNTER — Encounter: Payer: Self-pay | Admitting: Oncology

## 2017-07-06 VITALS — BP 138/67 | HR 87 | Temp 97.7°F | Resp 18 | Ht 62.0 in | Wt 143.5 lb

## 2017-07-06 DIAGNOSIS — C3412 Malignant neoplasm of upper lobe, left bronchus or lung: Secondary | ICD-10-CM | POA: Diagnosis not present

## 2017-07-06 DIAGNOSIS — C3432 Malignant neoplasm of lower lobe, left bronchus or lung: Secondary | ICD-10-CM | POA: Diagnosis not present

## 2017-07-06 DIAGNOSIS — C3411 Malignant neoplasm of upper lobe, right bronchus or lung: Secondary | ICD-10-CM

## 2017-07-06 DIAGNOSIS — R0789 Other chest pain: Secondary | ICD-10-CM | POA: Diagnosis not present

## 2017-07-06 DIAGNOSIS — Z95828 Presence of other vascular implants and grafts: Secondary | ICD-10-CM

## 2017-07-06 DIAGNOSIS — C3492 Malignant neoplasm of unspecified part of left bronchus or lung: Secondary | ICD-10-CM

## 2017-07-06 DIAGNOSIS — Z923 Personal history of irradiation: Secondary | ICD-10-CM | POA: Diagnosis not present

## 2017-07-06 DIAGNOSIS — Z5112 Encounter for antineoplastic immunotherapy: Secondary | ICD-10-CM | POA: Diagnosis not present

## 2017-07-06 DIAGNOSIS — Z9221 Personal history of antineoplastic chemotherapy: Secondary | ICD-10-CM | POA: Diagnosis not present

## 2017-07-06 DIAGNOSIS — R05 Cough: Secondary | ICD-10-CM | POA: Diagnosis not present

## 2017-07-06 DIAGNOSIS — G629 Polyneuropathy, unspecified: Secondary | ICD-10-CM | POA: Diagnosis not present

## 2017-07-06 DIAGNOSIS — Z79899 Other long term (current) drug therapy: Secondary | ICD-10-CM | POA: Diagnosis not present

## 2017-07-06 DIAGNOSIS — R5383 Other fatigue: Secondary | ICD-10-CM

## 2017-07-06 LAB — CBC WITH DIFFERENTIAL/PLATELET
BASOS ABS: 0 10*3/uL (ref 0.0–0.1)
Basophils Relative: 0 %
EOS PCT: 2 %
Eosinophils Absolute: 0.2 10*3/uL (ref 0.0–0.5)
HEMATOCRIT: 35.3 % (ref 34.8–46.6)
Hemoglobin: 11.2 g/dL — ABNORMAL LOW (ref 11.6–15.9)
LYMPHS PCT: 11 %
Lymphs Abs: 0.7 10*3/uL — ABNORMAL LOW (ref 0.9–3.3)
MCH: 26.7 pg (ref 25.1–34.0)
MCHC: 31.7 g/dL (ref 31.5–36.0)
MCV: 84 fL (ref 79.5–101.0)
MONO ABS: 0.6 10*3/uL (ref 0.1–0.9)
Monocytes Relative: 9 %
NEUTROS ABS: 5.2 10*3/uL (ref 1.5–6.5)
Neutrophils Relative %: 78 %
PLATELETS: 377 10*3/uL (ref 145–400)
RBC: 4.2 MIL/uL (ref 3.70–5.45)
RDW: 13.8 % (ref 11.2–16.1)
WBC: 6.7 10*3/uL (ref 3.9–10.3)

## 2017-07-06 LAB — COMPREHENSIVE METABOLIC PANEL
ALT: 13 U/L (ref 0–55)
AST: 12 U/L (ref 5–34)
Albumin: 3.4 g/dL — ABNORMAL LOW (ref 3.5–5.0)
Alkaline Phosphatase: 90 U/L (ref 40–150)
Anion gap: 10 (ref 3–11)
BUN: 9 mg/dL (ref 7–26)
CHLORIDE: 105 mmol/L (ref 98–109)
CO2: 28 mmol/L (ref 22–29)
CREATININE: 0.74 mg/dL (ref 0.60–1.10)
Calcium: 9.3 mg/dL (ref 8.4–10.4)
Glucose, Bld: 98 mg/dL (ref 70–140)
Potassium: 3.7 mmol/L (ref 3.3–4.7)
Sodium: 143 mmol/L (ref 136–145)
TOTAL PROTEIN: 7.1 g/dL (ref 6.4–8.3)
Total Bilirubin: 0.2 mg/dL (ref 0.2–1.2)

## 2017-07-06 LAB — TSH: TSH: 1.507 u[IU]/mL (ref 0.308–3.960)

## 2017-07-06 MED ORDER — ALTEPLASE 2 MG IJ SOLR
2.0000 mg | Freq: Once | INTRAMUSCULAR | Status: AC
  Administered 2017-07-06: 2 mg
  Filled 2017-07-06: qty 2

## 2017-07-06 MED ORDER — SODIUM CHLORIDE 0.9% FLUSH
10.0000 mL | INTRAVENOUS | Status: DC | PRN
  Administered 2017-07-06: 10 mL
  Filled 2017-07-06: qty 10

## 2017-07-06 MED ORDER — SODIUM CHLORIDE 0.9 % IV SOLN
9.2000 mg/kg | Freq: Once | INTRAVENOUS | Status: AC
  Administered 2017-07-06: 620 mg via INTRAVENOUS
  Filled 2017-07-06: qty 10

## 2017-07-06 MED ORDER — ALTEPLASE 2 MG IJ SOLR
INTRAMUSCULAR | Status: AC
  Filled 2017-07-06: qty 2

## 2017-07-06 MED ORDER — HEPARIN SOD (PORK) LOCK FLUSH 100 UNIT/ML IV SOLN
500.0000 [IU] | Freq: Once | INTRAVENOUS | Status: AC | PRN
  Administered 2017-07-06: 500 [IU]
  Filled 2017-07-06: qty 5

## 2017-07-06 MED ORDER — SODIUM CHLORIDE 0.9 % IV SOLN
Freq: Once | INTRAVENOUS | Status: AC
  Administered 2017-07-06: 10:00:00 via INTRAVENOUS

## 2017-07-06 MED ORDER — SODIUM CHLORIDE 0.9% FLUSH
10.0000 mL | Freq: Once | INTRAVENOUS | Status: AC
  Administered 2017-07-06: 10 mL
  Filled 2017-07-06: qty 10

## 2017-07-06 NOTE — Progress Notes (Signed)
[facility] OFFICE PROGRESS NOTE  [name], [name], MD [place] [address]  DIAGNOSIS: Stage IIB(T2b, N1, M0) non-small cell lung cancer, adenocarcinoma presented with large left upper lobe lung mass in addition to left hilar lymphadenopathy diagnosed in April 2018. Her previous workup was done at [facility] in [facility]. There was insufficient material to perform the molecular studies.  GUARDANT 360 Molecular studies: BRCA2 N387f. Negative for EGFR, ALK, ROS1, BRAF mutations.  PRIOR THERAPY: A course of concurrent chemoradiation with weekly carboplatin for AUC of 2 and paclitaxel 45 MG/M2. First dose 01/24/2017. Status post 6 cycles.  CURRENT THERAPY: Consolidation immunotherapy with Imfinzi (Durvalumab) 10 mg/KG every 2 weeks status post 6 cycles.  INTERVAL HISTORY: Felicia Veronica62y.o. female returns for routine follow-up visit by herself.  The patient is feeling fine today with no specific complaints except for ongoing neuropathic pain to her right chest.  She uses 1 Percocet at bedtime to help with the pain.  She was previously trialed on gabapentin and lidocaine patches which she reports did not work.  She is awaiting an appointment with neurology for further evaluation and management.  Patient denies fevers and chills.  Denies chest pain, shortness of breath, hemoptysis.  She has an ongoing dry persistent cough.  Denies nausea, vomiting, constipation, diarrhea.  Patient is here for evaluation prior to cycle #7 of Imfinzi and to review her restaging CT scan of the chest.  MEDICAL HISTORY: Past Medical History:  Diagnosis Date  . Adenocarcinoma of left lung, stage 2 ([facility] 12/27/2016  . Cancer ([facility]    Lun cancer: Right Dx 2008, s/p lobectomy. Left Dx 09/2016  . COPD (chronic obstructive pulmonary disease) ([facility]   . Encounter for antineoplastic chemotherapy 12/27/2016  . History of radiation therapy 01/24/17-03/08/17   left  lung 2 Gy in 30 fractions  . Hyperlipidemia   . Hypertension   . Stroke ([facility]     ALLERGIES:  has No Known Allergies.  MEDICATIONS:  Current Outpatient Medications  Medication Sig Dispense Refill  . aspirin EC 81 MG tablet Take 81 mg by mouth daily.    .Marland Kitchenatorvastatin (LIPITOR) 40 MG tablet Take 1 tablet (40 mg total) by mouth daily. 90 tablet 1  . Dextromethorphan HBr (COUGH SUPPRESSANT PO) Take by mouth.    . fluticasone (FLONASE) 50 MCG/ACT nasal spray Place 1 spray into both nostrils 2 (two) times daily. 16 g 1  . fluticasone furoate-vilanterol (BREO ELLIPTA) 200-25 MCG/INH AEPB Inhale 1 puff into the lungs daily. 60 each 5  . levothyroxine (SYNTHROID, LEVOTHROID) 75 MCG tablet Take 1 tablet (75 mcg total) by mouth daily before breakfast. 90 tablet 2  . lidocaine-prilocaine (EMLA) cream Apply 1 application topically as needed. 30 g 0  . omeprazole (PRILOSEC) 20 MG capsule Take 20 mg by mouth daily.    .Marland KitchenoxyCODONE-acetaminophen (PERCOCET/ROXICET) 5-325 MG tablet Take 1 tablet by mouth every 6 (six) hours as needed for severe pain. 30 tablet 0  . gabapentin (NEURONTIN) 300 MG capsule Take 1 capsule (300 mg total) by mouth 3 (three) times daily. (Patient not taking: Reported on 07/06/2017) 90 capsule 0  . prochlorperazine (COMPAZINE) 10 MG tablet Take 1 tablet (10 mg total) by mouth every 6 (six) hours as needed for nausea or vomiting. (Patient not taking: Reported on 07/06/2017) 30 tablet 0   No current facility-administered medications for this visit.    Facility-Administered Medications Ordered in Other Visits  Medication Dose Route  Frequency Provider Last Rate Last Dose  . 0.9 %  sodium chloride infusion   Intravenous Once Curt Bears, MD      . durvalumab (IMFINZI) 620 mg in sodium chloride 0.9 % 100 mL chemo infusion  9.2 mg/kg (Treatment Plan Recorded) Intravenous Once Curt Bears, MD      . heparin lock flush 100 unit/mL  500 Units Intracatheter Once PRN Curt Bears,  MD      . sodium chloride flush (NS) 0.9 % injection 10 mL  10 mL Intracatheter PRN Curt Bears, MD   10 mL at 06/22/17 1747  . sodium chloride flush (NS) 0.9 % injection 10 mL  10 mL Intracatheter PRN Curt Bears, MD        SURGICAL HISTORY:  Past Surgical History:  Procedure Laterality Date  . BREAST BIOPSY     several. Denies Hx of breast cancer.  . IR FLUORO GUIDE PORT INSERTION RIGHT  02/04/2017  . IR US GUIDE VASC ACCESS RIGHT  02/04/2017  . THORACOTOMY/LOBECTOMY Right 2008  . TONSILLECTOMY  1960    REVIEW OF SYSTEMS:   Review of Systems  Constitutional: Negative for appetite change, chills, fatigue, fever and unexpected weight change.  HENT:   Negative for mouth sores, nosebleeds, sore throat and trouble swallowing.   Eyes: Negative for eye problems and icterus.  Respiratory: Negative for hemoptysis, shortness of breath and wheezing.  Positive for ongoing nonproductive cough. Cardiovascular: Negative for chest pain and leg swelling.  Gastrointestinal: Negative for abdominal pain, constipation, diarrhea, nausea and vomiting.  Genitourinary: Negative for bladder incontinence, difficulty urinating, dysuria, frequency and hematuria.   Musculoskeletal: Negative for back pain, gait problem, neck pain and neck stiffness.  Skin: Negative for itching and rash.  Neurological: Negative for dizziness, extremity weakness, gait problem, headaches, light-headedness and seizures. Positive for right chest neuropathic pain. Hematological: Negative for adenopathy. Does not bruise/bleed easily.  Psychiatric/Behavioral: Negative for confusion, depression and sleep disturbance. The patient is not nervous/anxious.     PHYSICAL EXAMINATION:  Blood pressure 138/67, pulse 87, temperature 97.7 F (36.5 C), temperature source Oral, resp. rate 18, height '5\' 2"'$  (1.575 m), weight 143 lb 8 oz (65.1 kg), SpO2 95 %.  ECOG PERFORMANCE STATUS: 1 - Symptomatic but completely ambulatory  Physical Exam   Constitutional: Oriented to person, place, and time and well-developed, well-nourished, and in no distress. No distress.  HENT:  Head: Normocephalic and atraumatic.  Mouth/Throat: Oropharynx is clear and moist. No oropharyngeal exudate.  Eyes: Conjunctivae are normal. Right eye exhibits no discharge. Left eye exhibits no discharge. No scleral icterus.  Neck: Normal range of motion. Neck supple.  Cardiovascular: Normal rate, regular rhythm, normal heart sounds and intact distal pulses.   Pulmonary/Chest: Effort normal and breath sounds normal. No respiratory distress. No wheezes. No rales.  Abdominal: Soft. Bowel sounds are normal. Exhibits no distension and no mass. There is no tenderness.  Musculoskeletal: Normal range of motion. Exhibits no edema.  Lymphadenopathy:    No cervical adenopathy.  Neurological: Alert and oriented to person, place, and time. Exhibits normal muscle tone. Gait normal. Coordination normal.  Skin: Skin is warm and dry. No rash noted. Not diaphoretic. No erythema. No pallor.  Psychiatric: Mood, memory and judgment normal.  Vitals reviewed.  LABORATORY DATA: Lab Results  Component Value Date   WBC 6.7 07/06/2017   HGB 11.2 (L) 07/06/2017   HCT 35.3 07/06/2017   MCV 84.0 07/06/2017   PLT 377 07/06/2017      Chemistry  Component Value Date/Time   NA 143 07/06/2017 0742   NA 142 06/22/2017 1335   K 3.7 07/06/2017 0742   K 3.9 06/22/2017 1335   CL 105 07/06/2017 0742   CO2 28 07/06/2017 0742   CO2 26 06/22/2017 1335   BUN 9 07/06/2017 0742   BUN 13.6 06/22/2017 1335   CREATININE 0.74 07/06/2017 0742   CREATININE 0.7 06/22/2017 1335      Component Value Date/Time   CALCIUM 9.3 07/06/2017 0742   CALCIUM 9.4 06/22/2017 1335   ALKPHOS 90 07/06/2017 0742   ALKPHOS 93 06/22/2017 1335   AST 12 07/06/2017 0742   AST 12 06/22/2017 1335   ALT 13 07/06/2017 0742   ALT 15 06/22/2017 1335   BILITOT 0.2 07/06/2017 0742   BILITOT 0.25 06/22/2017 1335        RADIOGRAPHIC STUDIES:  Ct Chest W Contrast  Result Date: 07/04/2017 CLINICAL DATA:  Left lung cancer, status post chemotherapy and XRT, immunotherapy ongoing. History of right lung cancer in 2008, status post surgery. Severe right chest pain. Chronic cough. EXAM: CT CHEST WITH CONTRAST TECHNIQUE: Multidetector CT imaging of the chest was performed during intravenous contrast administration. CONTRAST:  75 mL Isovue 300 IV COMPARISON:  04/04/2017 FINDINGS: Cardiovascular: Heart is normal in size. Rightward cardiomediastinal shift. No evidence of thoracic aortic aneurysm. Atherosclerotic calcifications of the aortic arch. Right chest port terminating at the cavoatrial junction. Mediastinum/Nodes: No suspicious mediastinal lymphadenopathy. Visualized thyroid unremarkable. Lungs/Pleura: Status post right pneumonectomy with tracheal stent extending into the right mainstem bronchus. Associated small right pleural collection. 1.7 x 2.6 cm irregular left upper lobe pulmonary nodule anteriorly (series 7/image 51), previously 1.8 x 3.9 cm. Surrounding radiation changes in the left upper lobe/paramediastinal region (series 7/image 57). Minimal tree-in-bud nodularity in the left lower lobe (series 7/image 104), new, raising the possibility of mild superimposed infection. No pneumothorax. Upper Abdomen: Visualized upper abdomen is grossly unremarkable. Musculoskeletal: Visualized osseous structures are within normal limits. IMPRESSION: 1.7 x 2.6 cm left upper lobe pulmonary nodule, corresponding to known primary bronchogenic neoplasm, decreased. Surrounding radiation changes. Mild tree-in-bud nodularity in the left lower lobe, raising the possibility of mild superimposed infection. Status post right pneumonectomy. Aortic Atherosclerosis (ICD10-I70.0). Electronically Signed   By: Julian Hy M.D.   On: 07/04/2017 15:48     ASSESSMENT/PLAN:  Adenocarcinoma of left lung, stage 2 ([place]) This is a very pleasant  62 year old white female recently diagnosed with unresectable a stage IIB non-small cell lung cancer, adenocarcinoma. The patient underwent a course of concurrent chemoradiation with weekly carboplatin and paclitaxel status post 6 cycles.  She tolerated the treatment well and had partial response. She is currently undergoing consolidation treatment with immunotherapy with Imfinzi (Durvalumab) status post 6 cycles and has been tolerating this treatment very well.  The patient was seen with Dr. Julien Nordmann.  Restaging CT scan of the chest was discussed with the patient.  This shows improvement in her disease.  Recommend that she continue on Imfinzi.  She will proceed with cycle 7 today as scheduled. The patient will follow up in 2 weeks for evaluation prior to cycle #8.  For her ongoing right chest neuropathic pain, she will continue Percocet at bedtime.  I have made a referral to our neuro-oncologist for further evaluation and management of her neuropathic pain.  She was advised to call immediately if she has any concerning symptoms in the interval. The patient voices understanding of current disease status and treatment options and is in agreement  with the current care plan. All questions were answered. The patient knows to call the clinic with any problems, questions or concerns. We can certainly see the patient much sooner if necessary.  Orders Placed This Encounter  Procedures  . Ambulatory referral to Neurology    Referral Priority:   Routine    Referral Type:   Consultation    Referral Reason:   Specialty Services Required    Referred to Provider:   Ventura Sellers, MD    Requested Specialty:   Neurology    Number of Visits Requested:   1     Mikey Bussing, [place], AGPCNP-BC, AOCNP 07/06/17  ADDENDUM: Hematology/Oncology Attending: I had a face-to-face encounter with the patient today.  I recommended her care plan.  This is a very pleasant 62 years old white female with unresectable  stage IIb non-small cell lung cancer, adenocarcinoma status post a course of concurrent chemoradiation and she is currently undergoing consolidation immunotherapy was Imfinzi (Durvalumab) status post 6 cycles.  She has been tolerating this treatment fairly well with no concerning complaints.  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 She had repeat CT scan of the chest performed recently.  I personally and independently reviewed the scan images and discussed the results with the patient today.  Her scan showed further improvement of her disease.  I recommended for the patient to continue her current consolidation immunotherapy with Imfinzi (Durvalumab) and she will proceed with cycle #7 today. She will come back for follow-up visit in 2 weeks for evaluation before the next dose of her treatment. For the neuropathy of the right side of the chest, she is scheduled to see neurology for evaluation next month. The patient was advised to call immediately if she has any concerning symptoms in the interval.  Disclaimer: This note was dictated with voice recognition software. Similar sounding words can inadvertently be transcribed and may be missed upon review. Eilleen Kempf, MD 07/06/17

## 2017-07-06 NOTE — Patient Instructions (Signed)
[facility] Discharge Instructions for Patients Receiving Chemotherapy  Today you received the following chemotherapy agents: Imfinzi.  To help prevent nausea and vomiting after your treatment, we encourage you to take your nausea medication as directed.   If you develop nausea and vomiting that is not controlled by your nausea medication, call the clinic.   BELOW ARE SYMPTOMS THAT SHOULD BE REPORTED IMMEDIATELY:  *FEVER GREATER THAN 100.5 F  *CHILLS WITH OR WITHOUT FEVER  NAUSEA AND VOMITING THAT IS NOT CONTROLLED WITH YOUR NAUSEA MEDICATION  *UNUSUAL SHORTNESS OF BREATH  *UNUSUAL BRUISING OR BLEEDING  TENDERNESS IN MOUTH AND THROAT WITH OR WITHOUT PRESENCE OF ULCERS  *URINARY PROBLEMS  *BOWEL PROBLEMS  UNUSUAL RASH Items with * indicate a potential emergency and should be followed up as soon as possible.  Feel free to call the clinic should you have any questions or concerns. The clinic phone number is [phone].  Please show the CHEMO ALERT CARD at check-in to the Emergency Department and triage nurse.   

## 2017-07-06 NOTE — Telephone Encounter (Signed)
Scheduled appt per 1/16 los - pt to get an updated schedule in the treatment area.

## 2017-07-06 NOTE — Assessment & Plan Note (Signed)
This is a very pleasant 62 year old white female recently diagnosed with unresectable a stage IIB non-small cell lung cancer, adenocarcinoma. The patient underwent a course of concurrent chemoradiation with weekly carboplatin and paclitaxel status post 6 cycles.  She tolerated the treatment well and had partial response. She is currently undergoing consolidation treatment with immunotherapy with Imfinzi (Durvalumab) status post 6 cycles and has been tolerating this treatment very well.  The patient was seen with Dr. Julien Nordmann.  Restaging CT scan of the chest was discussed with the patient.  This shows improvement in her disease.  Recommend that she continue on Imfinzi.  She will proceed with cycle 7 today as scheduled. The patient will follow up in 2 weeks for evaluation prior to cycle #8.  For her ongoing right chest neuropathic pain, she will continue Percocet at bedtime.  I have made a referral to our neuro-oncologist for further evaluation and management of her neuropathic pain.  She was advised to call immediately if she has any concerning symptoms in the interval. The patient voices understanding of current disease status and treatment options and is in agreement with the current care plan. All questions were answered. The patient knows to call the clinic with any problems, questions or concerns. We can certainly see the patient much sooner if necessary.

## 2017-07-06 NOTE — Progress Notes (Signed)
No blood return from port after flushing several times and repositioning. Cathflow was released desk nurse called (Tammi) to administer. Labs were drawn by Lab 3. Malvina Schadler LPN

## 2017-07-07 ENCOUNTER — Ambulatory Visit: Payer: Medicare Other | Admitting: Radiation Oncology

## 2017-07-13 ENCOUNTER — Encounter: Payer: Self-pay | Admitting: Radiation Oncology

## 2017-07-13 ENCOUNTER — Encounter: Payer: Self-pay | Admitting: Internal Medicine

## 2017-07-13 ENCOUNTER — Ambulatory Visit
Admission: RE | Admit: 2017-07-13 | Discharge: 2017-07-13 | Disposition: A | Discharge status: Home / Self Care | Payer: Medicare Other | Source: Ambulatory Visit | Attending: Radiation Oncology | Admitting: Radiation Oncology

## 2017-07-13 ENCOUNTER — Telehealth: Payer: Self-pay | Admitting: Internal Medicine

## 2017-07-13 ENCOUNTER — Inpatient Hospital Stay (HOSPITAL_BASED_OUTPATIENT_CLINIC_OR_DEPARTMENT_OTHER): Payer: Medicare Other | Admitting: Internal Medicine

## 2017-07-13 ENCOUNTER — Other Ambulatory Visit: Payer: Self-pay

## 2017-07-13 VITALS — BP 149/75 | HR 108 | Temp 98.8°F | Resp 18 | Wt 139.6 lb

## 2017-07-13 DIAGNOSIS — R05 Cough: Secondary | ICD-10-CM | POA: Diagnosis not present

## 2017-07-13 DIAGNOSIS — Z7952 Long term (current) use of systemic steroids: Secondary | ICD-10-CM | POA: Diagnosis not present

## 2017-07-13 DIAGNOSIS — Z902 Acquired absence of lung [part of]: Secondary | ICD-10-CM | POA: Diagnosis not present

## 2017-07-13 DIAGNOSIS — Z923 Personal history of irradiation: Secondary | ICD-10-CM | POA: Insufficient documentation

## 2017-07-13 DIAGNOSIS — C3492 Malignant neoplasm of unspecified part of left bronchus or lung: Secondary | ICD-10-CM | POA: Insufficient documentation

## 2017-07-13 DIAGNOSIS — C3412 Malignant neoplasm of upper lobe, left bronchus or lung: Secondary | ICD-10-CM | POA: Diagnosis not present

## 2017-07-13 DIAGNOSIS — G629 Polyneuropathy, unspecified: Secondary | ICD-10-CM | POA: Insufficient documentation

## 2017-07-13 DIAGNOSIS — I700 Atherosclerosis of aorta: Secondary | ICD-10-CM | POA: Diagnosis not present

## 2017-07-13 DIAGNOSIS — M792 Neuralgia and neuritis, unspecified: Secondary | ICD-10-CM | POA: Diagnosis not present

## 2017-07-13 DIAGNOSIS — Z79891 Long term (current) use of opiate analgesic: Secondary | ICD-10-CM | POA: Insufficient documentation

## 2017-07-13 DIAGNOSIS — Z08 Encounter for follow-up examination after completed treatment for malignant neoplasm: Secondary | ICD-10-CM | POA: Diagnosis not present

## 2017-07-13 DIAGNOSIS — C3432 Malignant neoplasm of lower lobe, left bronchus or lung: Secondary | ICD-10-CM | POA: Diagnosis not present

## 2017-07-13 DIAGNOSIS — Z5112 Encounter for antineoplastic immunotherapy: Secondary | ICD-10-CM | POA: Diagnosis not present

## 2017-07-13 DIAGNOSIS — L539 Erythematous condition, unspecified: Secondary | ICD-10-CM | POA: Diagnosis not present

## 2017-07-13 DIAGNOSIS — R0789 Other chest pain: Secondary | ICD-10-CM | POA: Diagnosis not present

## 2017-07-13 DIAGNOSIS — G580 Intercostal neuropathy: Secondary | ICD-10-CM

## 2017-07-13 DIAGNOSIS — Z7982 Long term (current) use of aspirin: Secondary | ICD-10-CM | POA: Insufficient documentation

## 2017-07-13 DIAGNOSIS — R530 Neoplastic (malignant) related fatigue: Secondary | ICD-10-CM | POA: Diagnosis not present

## 2017-07-13 DIAGNOSIS — Y842 Radiological procedure and radiotherapy as the cause of abnormal reaction of the patient, or of later complication, without mention of misadventure at the time of the procedure: Secondary | ICD-10-CM | POA: Insufficient documentation

## 2017-07-13 DIAGNOSIS — Z79899 Other long term (current) drug therapy: Secondary | ICD-10-CM | POA: Insufficient documentation

## 2017-07-13 MED ORDER — PREGABALIN 50 MG PO CAPS: 50.0000 mg | ORAL_CAPSULE | Freq: Two times a day (BID) | ORAL | 1 refills | Status: DC

## 2017-07-13 MED ORDER — METHYLPREDNISOLONE 4 MG PO TABS: ORAL_TABLET | ORAL | 0 refills | Status: DC

## 2017-07-13 NOTE — Telephone Encounter (Signed)
Scheduled appt per 1/23 los - Gave patient AVS and calender per los.  

## 2017-07-13 NOTE — Patient Instructions (Signed)
Thank you for choosing [facility] to provide your oncology and hematology care.  To afford each patient quality time with our providers, please arrive 30 minutes before your scheduled appointment time.  If you arrive late for your appointment, you may be asked to reschedule.  We strive to give you quality time with our providers, and arriving late affects you and other patients whose appointments are after yours.   If you are a no show for multiple scheduled visits, you may be dismissed from the clinic at the providers discretion.    Again, thank you for choosing [facility], our hope is that these requests will decrease the amount of time that you wait before being seen by our physicians.  ______________________________________________________________________  Should you have questions after your visit to the [facility], please contact our office at [phone] between the hours of 8:30 and 4:30 p.m.    Voicemails left after 4:30p.m will not be returned until the following business day.    For prescription refill requests, please have your pharmacy contact us directly.  Please also try to allow 48 hours for prescription requests.    Please contact the scheduling department for questions regarding scheduling.  For scheduling of procedures such as PET scans, CT scans, MRI, Ultrasound, etc please contact central scheduling at [phone].    Resources For Cancer Patients and Caregivers:   Oncolink.org:  A wonderful resource for patients and healthcare providers for information regarding your disease, ways to tract your treatment, what to expect, etc.     American Cancer Society:  800-227-2345  Can help patients locate various types of support and financial assistance  Cancer Care: 1-800-813-HOPE (4673) Provides financial assistance, online support groups, medication/co-pay assistance.    [place] DSS:  336-641-3447 Where to apply for food  stamps, Medicaid, and utility assistance  Medicare Rights Center: 800-333-4114 Helps people with Medicare understand their rights and benefits, navigate the Medicare system, and secure the quality healthcare they deserve  SCAT: 336-333-6589 [place] [employer]'s shared-ride transportation service for eligible riders who have a disability that prevents them from riding the fixed route bus.    For additional information on assistance programs please contact our social worker:   Grier Hock/Abigail Elmore:  [phone]            

## 2017-07-13 NOTE — Progress Notes (Signed)
Felicia Herrera is here for follow up.  She continues to have pain in her right side.  She is taking percocet at [facility] and also saw Dr. Mickeal Skinner today and was given a medrol dosepak and lyrica.  She reports having a cold with a productive cough that started a week ago.  She reports her shortness of breath has improved.  She denies having any hemoptysis.  She will have her next infusion of Imfinzi on 07/20/17.  She reports her energy level is up and down.  BP 132/76 (BP Location: Left Arm, Patient Position: Sitting)   Pulse 96   Temp 98.7 F (37.1 C) (Oral)   Ht 5\' 2"  (1.575 m)   Wt 142 lb (64.4 kg)   SpO2 94%   BMI 25.97 kg/m    Wt Readings from Last 3 Encounters:  07/13/17 142 lb (64.4 kg)  07/13/17 139 lb 9.6 oz (63.3 kg)  07/06/17 143 lb 8 oz (65.1 kg)

## 2017-07-13 NOTE — Progress Notes (Signed)
Radiation Oncology         [phone] ________________________________  Name: Felicia Herrera MRN: [id number]  Date: 07/13/2017  DOB: 05/02/1956  Follow-Up Visit Note  CC: [name], [name], MD  Curt Bears, MD    ICD-10-CM   1. Adenocarcinoma of left lung, stage 2 (HCC) C34.92     Diagnosis:   Stage IIB (T2b, N1, M0) non-small cell lung cancer adenocarcinoma   Interval Since Last Radiation:  4 months  Narrative:  The patient returns today for routine follow-up. She was evaluated by neuro-oncologist, Dr. Mickeal Skinner earlier today and prescribed a medrol dosepak and lyrica for her symptoms. She will pick up those medications later today. She has since d/c use of gabapentin. She reports that she will go off of the oxycodone when she has better pain control. She will have her next infusion of Imfinzi on 07/20/17 which she is tolerating well at this time.    Since her last visit to the office, she underwent a CT chest with contrast on 07/04/17 with results revealing: IMPRESSION: 1.7 x 2.6 cm left upper lobe pulmonary nodule, corresponding to known primary bronchogenic neoplasm, decreased. Surrounding radiation changes. Mild tree-in-bud nodularity in the left lower lobe, raising the possibility of mild superimposed infection. Status post right pneumonectomy. Aortic Atherosclerosis        On review of systems, she reports persistent 8/10 right sided pain which she treats with 1 perocet nightly, recent URI flare with productive cough x 1 week, intermittent fatigue. She denies breathing issues, hemoptysis, home oxygen use, and any other symptoms.                        ALLERGIES:  has No Known Allergies.  Meds: Current Outpatient Medications  Medication Sig Dispense Refill  . aspirin EC 81 MG tablet Take 81 mg by mouth daily.    Marland Kitchen atorvastatin (LIPITOR) 40 MG tablet Take 1 tablet (40 mg total) by mouth daily. 90 tablet 1  . Dextromethorphan HBr (COUGH SUPPRESSANT PO) Take by mouth.    .  fluticasone (FLONASE) 50 MCG/ACT nasal spray Place 1 spray into both nostrils 2 (two) times daily. 16 g 1  . fluticasone furoate-vilanterol (BREO ELLIPTA) 200-25 MCG/INH AEPB Inhale 1 puff into the lungs daily. 60 each 5  . levothyroxine (SYNTHROID, LEVOTHROID) 75 MCG tablet Take 1 tablet (75 mcg total) by mouth daily before breakfast. 90 tablet 2  . omeprazole (PRILOSEC) 20 MG capsule Take 20 mg by mouth daily.    Marland Kitchen oxyCODONE-acetaminophen (PERCOCET/ROXICET) 5-325 MG tablet Take 1 tablet by mouth every 6 (six) hours as needed for severe pain. 30 tablet 0  . prochlorperazine (COMPAZINE) 10 MG tablet Take 1 tablet (10 mg total) by mouth every 6 (six) hours as needed for nausea or vomiting. 30 tablet 0  . lidocaine-prilocaine (EMLA) cream Apply 1 application topically as needed. (Patient not taking: Reported on 07/13/2017) 30 g 0  . methylPREDNISolone (MEDROL) 4 MG tablet 21 tablet dose pack taper as directed (Patient not taking: Reported on 07/13/2017) 21 tablet 0  . pregabalin (LYRICA) 50 MG capsule Take 1 capsule (50 mg total) by mouth 2 (two) times daily. (Patient not taking: Reported on 07/13/2017) 60 capsule 1   No current facility-administered medications for this encounter.    Facility-Administered Medications Ordered in Other Encounters  Medication Dose Route Frequency Provider Last Rate Last Dose  . sodium chloride flush (NS) 0.9 % injection 10 mL  10 mL Intracatheter PRN  Curt Bears, MD   10 mL at 06/22/17 1747    Physical Findings: The patient is in no acute distress. Patient is alert and oriented.  height is 5\' 2"  (1.575 m) and weight is 142 lb (64.4 kg). Her oral temperature is 98.7 F (37.1 C). Her blood pressure is 132/76 and her pulse is 96. Her oxygen saturation is 94%. Marland Kitchen  Heart has regular rate and rhythm. Left lung clear. No palpable cervical, supraclavicular, or axillary adenopathy. Abdomen soft, non-tender, normal bowel sounds. Pt has significant erythema along the  thoracotomy scar suspicious for a yeast infection. Careful exam shows no open areas into the chest at this location.   Lab Findings: Lab Results  Component Value Date   WBC 6.7 07/06/2017   HGB 11.2 (L) 07/06/2017   HCT 35.3 07/06/2017   MCV 84.0 07/06/2017   PLT 377 07/06/2017    Radiographic Findings: Ct Chest W Contrast  Result Date: 07/04/2017 CLINICAL DATA:  Left lung cancer, status post chemotherapy and XRT, immunotherapy ongoing. History of right lung cancer in 2008, status post surgery. Severe right chest pain. Chronic cough. EXAM: CT CHEST WITH CONTRAST TECHNIQUE: Multidetector CT imaging of the chest was performed during intravenous contrast administration. CONTRAST:  75 mL Isovue 300 IV COMPARISON:  04/04/2017 FINDINGS: Cardiovascular: Heart is normal in size. Rightward cardiomediastinal shift. No evidence of thoracic aortic aneurysm. Atherosclerotic calcifications of the aortic arch. Right chest port terminating at the cavoatrial junction. Mediastinum/Nodes: No suspicious mediastinal lymphadenopathy. Visualized thyroid unremarkable. Lungs/Pleura: Status post right pneumonectomy with tracheal stent extending into the right mainstem bronchus. Associated small right pleural collection. 1.7 x 2.6 cm irregular left upper lobe pulmonary nodule anteriorly (series 7/image 51), previously 1.8 x 3.9 cm. Surrounding radiation changes in the left upper lobe/paramediastinal region (series 7/image 57). Minimal tree-in-bud nodularity in the left lower lobe (series 7/image 104), new, raising the possibility of mild superimposed infection. No pneumothorax. Upper Abdomen: Visualized upper abdomen is grossly unremarkable. Musculoskeletal: Visualized osseous structures are within normal limits. IMPRESSION: 1.7 x 2.6 cm left upper lobe pulmonary nodule, corresponding to known primary bronchogenic neoplasm, decreased. Surrounding radiation changes. Mild tree-in-bud nodularity in the left lower lobe, raising the  possibility of mild superimposed infection. Status post right pneumonectomy. Aortic Atherosclerosis (ICD10-I70.0). Electronically Signed   By: Julian Hy M.D.   On: 07/04/2017 15:48    Impression:  The patient is recovering from the effects of radiation. No clinic evidence of recurrence on exam. CT Chest shows good shrinkage and the patient is on immunotherapy and is tolerating it well. Patient has had intractable intercostal neuropathic pain, etiology unknown  Plan:  Pain medication recently changed by Dr. Mickeal Skinner for neuropathic pain. Maintain f/u in medical oncology as scheduled. Pt was given antifungal powder with miconazole and nitrate today. Follow up with Radiation Oncology PRN.    ____________________________________ -----------------------------------  Blair Promise, PhD, MD  This document serves as a record of services personally performed by Gery Pray, MD. It was created on his behalf by [facility], a trained medical scribe. The creation of this record is based on the scribe's personal observations and the provider's statements to them. This document has been checked and approved by the attending provider.

## 2017-07-13 NOTE — Progress Notes (Signed)
[facility] [place], [place] [address] [phone]   New Patient Evaluation  Date of Service: 07/13/17 Patient Name: Felicia Herrera Patient MRN: [id number] Patient DOB: 12-Jun-1956 Provider: Ventura Sellers, MD  Identifying Statement:  Britteney Ayotte is a 62 y.o. 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    Referring Provider: [name], [name], MD [address] [place], [place] [address]  Primary Cancer: NSCLC Stage II  Oncologic History:   Adenocarcinoma of left lung, stage 2 ([place])   12/27/2016 Initial Diagnosis    Adenocarcinoma of left lung, stage 2 ([place])      02/28/2017 Genetic Testing    Patient had genetic testing due to a BRCA2, N3442f variant being identified on her [employer] testing.  She has a family history of pancreatic, lung, colon and brain cancer.  The Multi-Cancer Panel was ordered.  The Multi-Cancer Panel offered by [facility] includes sequencing and/or deletion duplication testing of the following 83 genes: ALK, APC, ATM, AXIN2,BAP1,  BARD1, BLM, BMPR1A, BRCA1, BRCA2, BRIP1, CASR, CDC73, CDH1, CDK4, CDKN1B, CDKN1C, CDKN2A (p14ARF), CDKN2A (p16INK4a), CEBPA, CHEK2, CTNNA1, DICER1, DIS3L2, EGFR (c.2369C>T, p.Thr790Met variant only), EPCAM (Deletion/duplication testing only), FH, FLCN, GATA2, GPC3, GREM1 (Promoter region deletion/duplication testing only), HOXB13 (c.251G>A, p.Gly84Glu), HRAS, KIT, MAX, MEN1, MET, MITF (c.952G>A, p.Glu318Lys variant only), MLH1, MSH2, MSH3, MSH6, MUTYH, NBN, NF1, NF2, NTHL1, PALB2, PDGFRA, PHOX2B, PMS2, POLD1, POLE, POT1, PRKAR1A, PTCH1, PTEN, RAD50, RAD51C, RAD51D, RB1, RECQL4, RET, RUNX1, SDHAF2, SDHA (sequence changes only), SDHB, SDHC, SDHD, SMAD4, SMARCA4, SMARCB1, SMARCE1, STK11, SUFU, TERC, TERT, TMEM127, TP53, TSC1, TSC2, VHL, WRN and WT1.    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   Germline analysis of the BRCA2 gene was negative.   The date of this test report is 02/28/2017.         History of Present Illness: The patient's records from the referring physician were obtained and reviewed and the patient interviewed to confirm this HPI.  CIszabella Hebenstreitpresents today for consultation regarding chronic pain in her right chest wall, radiating from site of previous surgery [place] reaching the midline.  The pain is described as sharp/lancinating, 8/10, persistent, and disabling.  It is not affected by position or time of day.  It has been present since the onset of chemo-radiation this summer, onset was rather abrupt.  Gapapentin and Percocet have not been helpful, nor has lidocaine patch.  Percocet has been weaned to nightly from three times per day because of drowsiness.   Medications: Current Outpatient Medications on File Prior to Visit  Medication Sig Dispense Refill  . aspirin EC 81 MG tablet Take 81 mg by mouth daily.    .Marland Kitchenatorvastatin (LIPITOR) 40 MG tablet Take 1 tablet (40 mg total) by mouth daily. 90 tablet 1  . Dextromethorphan HBr (COUGH SUPPRESSANT PO) Take by mouth.    . fluticasone (FLONASE) 50 MCG/ACT nasal spray Place 1 spray into both nostrils 2 (two) times daily. 16 g 1  . fluticasone furoate-vilanterol (BREO ELLIPTA) 200-25 MCG/INH AEPB Inhale 1 puff into the lungs daily. 60 each 5  . levothyroxine (SYNTHROID, LEVOTHROID) 75 MCG tablet Take 1 tablet (75 mcg total) by mouth daily before breakfast. 90 tablet 2  . lidocaine-prilocaine (EMLA) cream  Apply 1 application topically as needed. 30 g 0  . omeprazole (PRILOSEC) 20 MG capsule Take 20 mg by mouth daily.    Marland Kitchen oxyCODONE-acetaminophen (PERCOCET/ROXICET) 5-325 MG tablet Take 1 tablet by mouth  every 6 (six) hours as needed for severe pain. 30 tablet 0  . prochlorperazine (COMPAZINE) 10 MG tablet Take 1 tablet (10 mg total) by mouth every 6 (six) hours as needed for nausea or vomiting. 30 tablet 0   Current Facility-Administered Medications on File Prior to Visit  Medication Dose Route Frequency Provider Last Rate Last Dose  . sodium chloride flush (NS) 0.9 % injection 10 mL  10 mL Intracatheter PRN Curt Bears, MD   10 mL at 06/22/17 1747    Allergies: No Known Allergies Past Medical History:  Past Medical History:  Diagnosis Date  . Adenocarcinoma of left lung, stage 2 ([place]) 12/27/2016  . Cancer [facility])    Lun cancer: Right Dx 2008, s/p lobectomy. Left Dx 09/2016  . COPD (chronic obstructive pulmonary disease) ([place])   . Encounter for antineoplastic chemotherapy 12/27/2016  . History of radiation therapy 01/24/17-03/08/17   left lung 2 Gy in 30 fractions  . Hyperlipidemia   . Hypertension   . Stroke [facility])    Past Surgical History:  Past Surgical History:  Procedure Laterality Date  . BREAST BIOPSY     several. Denies Hx of breast cancer.  . IR FLUORO GUIDE PORT INSERTION RIGHT  02/04/2017  . IR US GUIDE VASC ACCESS RIGHT  02/04/2017  . THORACOTOMY/LOBECTOMY Right 2008  . TONSILLECTOMY  1960   Social History:  Social History   Socioeconomic History  . Marital status: Single    Spouse name: Not on file  . Number of children: Not on file  . Years of education: Not on file  . Highest education level: Not on file  Social Needs  . Financial resource strain: Not on file  . Food insecurity - worry: Not on file  . Food insecurity - inability: Not on file  . Transportation needs - medical: Not on file  . Transportation needs - non-medical: Not on file  Occupational History  . Occupation: Biomedical scientist  Tobacco Use  . Smoking status: Former Smoker    Packs/day: 1.00    Years: 30.00    Pack years: 30.00    Types: Cigarettes    Last attempt to quit: 06/21/2005    Years  since quitting: 12.0  . Smokeless tobacco: Never Used  Substance and Sexual Activity  . Alcohol use: No  . Drug use: No  . Sexual activity: Not Currently  Other Topics Concern  . 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    Family History:  Family History  Problem Relation Age of Onset  . Arthritis Mother   . Lung cancer Mother 73       d.45 from cancer  . Hypertension Father   . Arthritis Father   . Pancreatic cancer Sister 60       d.60 from cancer  . Brain cancer Brother        d.~70  . Colon cancer Brother        d.~70  . Hypertension Brother   . Mental retardation Brother   . Melanoma Brother 62  . Lung cancer Maternal Aunt 66  . Colon cancer Maternal Grandfather   . Depression Neg Hx   . Heart disease Neg Hx     Review of Systems: Constitutional:  Cough, congestion right now Eyes: Denies blurriness of vision Ears, nose, mouth, throat, and face: Denies mucositis or sore throat Respiratory: Denies cough, dyspnea or wheezes Cardiovascular: Denies palpitation, chest discomfort or lower extremity swelling Gastrointestinal:  Denies nausea, constipation, diarrhea GU: Denies dysuria or incontinence Skin: Denies abnormal skin rashes Neurological: Per HPI Musculoskeletal: Denies joint pain, back or neck discomfort. No decrease in ROM Behavioral/Psych: Denies anxiety, disturbance in thought content, and mood instability   Physical Exam: Vitals:   07/13/17 0939  BP: (!) 149/75  Pulse: (!) 108  Resp: 18  Temp: 98.8 F (37.1 C)  SpO2: 92%   KPS: 90. General: Alert, cooperative, pleasant, in no acute distress Head: Craniotomy scar noted, dry and intact. EENT: No conjunctival injection or scleral icterus. Oral mucosa moist Lungs: Resp effort normal Cardiac: Regular rate and rhythm Abdomen: Soft, non-distended abdomen Skin: Soft tissue depression beneath right breast, erythematous (surgical site). Extremities: No clubbing or  edema  Neurologic Exam: Mental Status: Awake, alert, attentive to examiner. Oriented to self and environment. Language is fluent with intact comprehension.  Cranial Nerves: Visual acuity is grossly normal. Visual fields are full. Extra-ocular movements intact. No ptosis. Face is symmetric, tongue midline. Motor: Tone and bulk are normal. Power is full in both arms and legs. Reflexes are symmetric, no pathologic reflexes present. Intact finger to nose bilaterally Sensory: Intact to light touch and temperature Gait: Normal and tandem gait is normal.   Labs: I have reviewed the data as listed    Component Value Date/Time   NA 143 07/06/2017 0742   NA 142 06/22/2017 1335   K 3.7 07/06/2017 0742   K 3.9 06/22/2017 1335   CL 105 07/06/2017 0742   CO2 28 07/06/2017 0742   CO2 26 06/22/2017 1335   GLUCOSE 98 07/06/2017 0742   GLUCOSE 100 06/22/2017 1335   BUN 9 07/06/2017 0742   BUN 13.6 06/22/2017 1335   CREATININE 0.74 07/06/2017 0742   CREATININE 0.7 06/22/2017 1335   CALCIUM 9.3 07/06/2017 0742   CALCIUM 9.4 06/22/2017 1335   PROT 7.1 07/06/2017 0742   PROT 7.5 06/22/2017 1335   ALBUMIN 3.4 (L) 07/06/2017 0742   ALBUMIN 3.6 06/22/2017 1335   AST 12 07/06/2017 0742   AST 12 06/22/2017 1335   ALT 13 07/06/2017 0742   ALT 15 06/22/2017 1335   ALKPHOS 90 07/06/2017 0742   ALKPHOS 93 06/22/2017 1335   BILITOT 0.2 07/06/2017 0742   BILITOT 0.25 06/22/2017 1335   GFRNONAA >60 07/06/2017 0742   GFRAA >60 07/06/2017 0742   Lab Results  Component Value Date   WBC 6.7 07/06/2017   NEUTROABS 5.2 07/06/2017   HGB 11.2 (L) 07/06/2017   HCT 35.3 07/06/2017   MCV 84.0 07/06/2017   PLT 377 07/06/2017     Assessment/Plan 1. Intractable intercostal neuropathic pain  Ms. Gaugh has chest wall pain which, by history, is suggestive of a neuropathic etiology.  It is likely related to damage from prior surgery which may have been exacerbated by recent chemoradiation.  Imaging and exam  do not demonstrate active pathophysiology at site of pain. At this time intervention is only symptomatic.  Part of the problem right now could be overuse of analgesia, as opiates are not the therapy of choice for this type of problem.  We recommended a solumedrol dose pack to lessen the inflammation and potentially allow further weaning of Percocet.  We recommended initiating Lyrica '50mg'$  BID because she has failed three prior medications (Gabapentin, Percocet,  Lidocaine patch).  This will likely need further uptitration.  We asked Ms. Nordgren to return in ~1 month for further medication adjustments.    We spent twenty additional minutes teaching regarding the natural history, biology, and historical experience in the treatment of neurologic complications of cancer. We also provided teaching sheets for the patient to take home as an additional resource.  We appreciate the opportunity to participate in the care of Kari Baars.   All questions were answered. The patient knows to call the clinic with any problems, questions or concerns. No barriers to learning were detected.  The total time spent in the encounter was 45 minutes and more than 50% was on counseling and review of test results   Ventura Sellers, MD Medical Director of Neuro-Oncology [facility] at [place] 07/13/17 1:32 PM

## 2017-07-19 ENCOUNTER — Encounter: Payer: Self-pay | Admitting: Internal Medicine

## 2017-07-19 NOTE — Progress Notes (Signed)
Called patient from 3 day look-back to introduce myself as Arboriculturist and to ask if she has any financial questions or concerns regarding treatment. Left voicemail with my contact name and number.

## 2017-07-20 ENCOUNTER — Inpatient Hospital Stay: Payer: Medicare Other

## 2017-07-20 ENCOUNTER — Encounter: Payer: Self-pay | Admitting: Internal Medicine

## 2017-07-20 ENCOUNTER — Inpatient Hospital Stay (HOSPITAL_BASED_OUTPATIENT_CLINIC_OR_DEPARTMENT_OTHER): Payer: Medicare Other | Admitting: Internal Medicine

## 2017-07-20 ENCOUNTER — Encounter: Payer: Self-pay | Admitting: General Practice

## 2017-07-20 ENCOUNTER — Encounter: Payer: Self-pay | Admitting: *Deleted

## 2017-07-20 VITALS — BP 139/76 | HR 104 | Temp 97.8°F | Resp 21 | Ht 62.0 in | Wt 138.4 lb

## 2017-07-20 DIAGNOSIS — Z79899 Other long term (current) drug therapy: Secondary | ICD-10-CM | POA: Diagnosis not present

## 2017-07-20 DIAGNOSIS — C3492 Malignant neoplasm of unspecified part of left bronchus or lung: Secondary | ICD-10-CM

## 2017-07-20 DIAGNOSIS — C3411 Malignant neoplasm of upper lobe, right bronchus or lung: Secondary | ICD-10-CM

## 2017-07-20 DIAGNOSIS — Z5112 Encounter for antineoplastic immunotherapy: Secondary | ICD-10-CM

## 2017-07-20 DIAGNOSIS — C3412 Malignant neoplasm of upper lobe, left bronchus or lung: Secondary | ICD-10-CM

## 2017-07-20 DIAGNOSIS — R0789 Other chest pain: Secondary | ICD-10-CM | POA: Diagnosis not present

## 2017-07-20 DIAGNOSIS — R05 Cough: Secondary | ICD-10-CM | POA: Diagnosis not present

## 2017-07-20 DIAGNOSIS — C3432 Malignant neoplasm of lower lobe, left bronchus or lung: Secondary | ICD-10-CM | POA: Diagnosis not present

## 2017-07-20 DIAGNOSIS — Z95828 Presence of other vascular implants and grafts: Secondary | ICD-10-CM

## 2017-07-20 DIAGNOSIS — G629 Polyneuropathy, unspecified: Secondary | ICD-10-CM | POA: Diagnosis not present

## 2017-07-20 LAB — COMPREHENSIVE METABOLIC PANEL WITH GFR
ALT: 10 U/L (ref 0–55)
AST: 10 U/L (ref 5–34)
Albumin: 3.4 g/dL — ABNORMAL LOW (ref 3.5–5.0)
Alkaline Phosphatase: 95 U/L (ref 40–150)
Anion gap: 10 (ref 3–11)
BUN: 19 mg/dL (ref 7–26)
CO2: 29 mmol/L (ref 22–29)
Calcium: 9.3 mg/dL (ref 8.4–10.4)
Chloride: 102 mmol/L (ref 98–109)
Creatinine, Ser: 0.8 mg/dL (ref 0.60–1.10)
GFR calc Af Amer: >60 mL/min
GFR calc non Af Amer: >60 mL/min
Glucose, Bld: 91 mg/dL (ref 70–140)
Potassium: 3.6 mmol/L (ref 3.5–5.1)
Sodium: 141 mmol/L (ref 136–145)
Total Bilirubin: 0.3 mg/dL (ref 0.2–1.2)
Total Protein: 7.3 g/dL (ref 6.4–8.3)

## 2017-07-20 LAB — CBC WITH DIFFERENTIAL/PLATELET
Basophils Absolute: 0.1 10*3/uL (ref 0.0–0.1)
Basophils Relative: 1 %
EOS ABS: 0 10*3/uL (ref 0.0–0.5)
Eosinophils Relative: 0 %
HEMATOCRIT: 37.6 % (ref 34.8–46.6)
HEMOGLOBIN: 12.3 g/dL (ref 11.6–15.9)
LYMPHS PCT: 9 %
Lymphs Abs: 0.9 10*3/uL (ref 0.9–3.3)
MCH: 26.2 pg (ref 25.1–34.0)
MCHC: 32.6 g/dL (ref 31.5–36.0)
MCV: 80.2 fL (ref 79.5–101.0)
Monocytes Absolute: 0.8 10*3/uL (ref 0.1–0.9)
Monocytes Relative: 8 %
NEUTROS ABS: 8.5 10*3/uL — AB (ref 1.5–6.5)
NEUTROS PCT: 82 %
Platelets: 493 10*3/uL — ABNORMAL HIGH (ref 145–400)
RBC: 4.69 MIL/uL (ref 3.70–5.45)
RDW: 15.1 % — ABNORMAL HIGH (ref 11.2–14.5)
WBC: 10.4 10*3/uL — AB (ref 3.9–10.3)

## 2017-07-20 MED ORDER — HEPARIN SOD (PORK) LOCK FLUSH 100 UNIT/ML IV SOLN
500.0000 [IU] | Freq: Once | INTRAVENOUS | Status: AC | PRN
  Administered 2017-07-20: 500 [IU]
  Filled 2017-07-20: qty 5

## 2017-07-20 MED ORDER — SODIUM CHLORIDE 0.9 % IV SOLN
Freq: Once | INTRAVENOUS | Status: AC
  Administered 2017-07-20: 11:00:00 via INTRAVENOUS

## 2017-07-20 MED ORDER — SODIUM CHLORIDE 0.9 % IV SOLN
620.0000 mg | Freq: Once | INTRAVENOUS | Status: AC
  Administered 2017-07-20: 620 mg via INTRAVENOUS
  Filled 2017-07-20: qty 2.4

## 2017-07-20 MED ORDER — AZITHROMYCIN 250 MG PO TABS: ORAL_TABLET | ORAL | 0 refills | Status: DC

## 2017-07-20 MED ORDER — SODIUM CHLORIDE 0.9% FLUSH
10.0000 mL | Freq: Once | INTRAVENOUS | Status: AC
  Administered 2017-07-20: 10 mL
  Filled 2017-07-20: qty 10

## 2017-07-20 MED ORDER — SODIUM CHLORIDE 0.9% FLUSH
10.0000 mL | INTRAVENOUS | Status: DC | PRN
  Administered 2017-07-20: 10 mL
  Filled 2017-07-20: qty 10

## 2017-07-20 NOTE — Patient Instructions (Signed)
[facility] Discharge Instructions for Patients Receiving Chemotherapy  Today you received the following chemotherapy agents: Imfinzi.  To help prevent nausea and vomiting after your treatment, we encourage you to take your nausea medication as directed.   If you develop nausea and vomiting that is not controlled by your nausea medication, call the clinic.   BELOW ARE SYMPTOMS THAT SHOULD BE REPORTED IMMEDIATELY:  *FEVER GREATER THAN 100.5 F  *CHILLS WITH OR WITHOUT FEVER  NAUSEA AND VOMITING THAT IS NOT CONTROLLED WITH YOUR NAUSEA MEDICATION  *UNUSUAL SHORTNESS OF BREATH  *UNUSUAL BRUISING OR BLEEDING  TENDERNESS IN MOUTH AND THROAT WITH OR WITHOUT PRESENCE OF ULCERS  *URINARY PROBLEMS  *BOWEL PROBLEMS  UNUSUAL RASH Items with * indicate a potential emergency and should be followed up as soon as possible.  Feel free to call the clinic should you have any questions or concerns. The clinic phone number is [phone].  Please show the CHEMO ALERT CARD at check-in to the Emergency Department and triage nurse.   

## 2017-07-20 NOTE — Progress Notes (Signed)
Oncology Nurse Navigator Documentation  Oncology Nurse Navigator Flowsheets 07/20/2017  Navigator Location [facility]  Navigator Encounter Type Clinic/MDC;Treatment/I spoke with patient at clinic today.  She is doing well without complaints but did seem a little anxious.  I followed up with her in chemo and gave her some information about support groups at the cancer center.  I thought this would help talk about her cancer journey.  I will update CSW on her seeming anxious.   Treatment Phase Treatment  Barriers/Navigation Needs Education;Coordination of Care  Education Other  Interventions Education;Coordination of Care  Coordination of Care Other  Education Method Verbal;Written  Support Groups/Services Other  Acuity Level 2  Time Spent with Patient 76

## 2017-07-20 NOTE — Progress Notes (Signed)
[place] CSW Progress Note  Received referral from Nurse Navigator to assess for social support needs.  Met w patient in infusion room to explore needs and resources.  Per patient, she moved to [place] from [place] 7 months ago after being diagnosed w cancer.  Had anticipated more support from brother who lives in [place].  However, patient has struggled to establish self w healthy psychosocial support in new community.  Is often limited by illness, "I spend most of my time in my house watching TV."  Formerly had group of friends, church and work activities - now is mostly communicating via internet w friends out of state.  CSW provided active listening and emotional support.  Reviewed patient's desires for community, provided information on [employer] and [employer].  Completed SCAT application so patient can access disability transportation in the community.  Provided information on church based cancer support program as well as several online support communities [employer], television/film set and [employer]), encouraged patient to investigate.  Referral made to chaplain.  CSW will check on patient during next visit, also provided contact information to patient in case needs arise prior to next chemo treatment.  Edwyna Shell, LCSW Clinical Social Worker Phone:  [phone]

## 2017-07-20 NOTE — Progress Notes (Signed)
[place] [phone]   Fax:(336) [phone]  OFFICE PROGRESS NOTE  Herrera, [name], MD [place] [address]  DIAGNOSIS: Stage IIB (T2b, N1, M0) non-small cell lung cancer, adenocarcinoma presented with large left upper lobe lung mass in addition to left hilar lymphadenopathy diagnosed in April 2018. Her previous workup was done at [facility] in [facility]. There was insufficient material to perform the molecular studies.  GUARDANT 360 Molecular studies: BRCA2 N365f. Negative for EGFR, ALK, ROS1, BRAF mutations.  PRIOR THERAPY:  A course of concurrent chemoradiation with weekly carboplatin for AUC of 2 and paclitaxel 45 MG/M2. First dose 01/24/2017. Status post 6 cycles.  CURRENT THERAPY: Consolidation immunotherapy with Imfinzi (Durvalumab) 10 mg/KG every 2 weeks status post 7 cycles.  INTERVAL HISTORY: Felicia Stantz62y.o. female returns to the clinic today for follow-up visit.  The patient is feeling fine today with no specific complaints except for cough productive of yellowish sputum.  She was seen recently by her primary care physician and was giving prednisone dose pak that she completed yesterday.  She denied having any chest pain or hemoptysis.  She has no significant weight loss or night sweats.  She has no fever or chills.  The patient denied having any nausea, vomiting, diarrhea or constipation.  She is here today for evaluation before starting cycle #8.  MEDICAL HISTORY: Past Medical History:  Diagnosis Date  . [place], [place] 2 [place]  . Cancer ([facility]    Lun cancer: Right Dx 2008, s/p lobectomy. Left Dx 09/2016  . COPD (chronic obstructive pulmonary disease) ([place]   . Encounter for antineoplastic chemotherapy 12/27/2016  . History of radiation therapy 01/24/17-03/08/17   left lung 2 Gy in 30 fractions  . Hyperlipidemia   . Hypertension   . Stroke ([facility]      ALLERGIES:  has No Known Allergies.  MEDICATIONS:  Current Outpatient Medications  Medication Sig Dispense Refill  . aspirin EC 81 MG tablet Take 81 mg by mouth daily.    .Marland Kitchenatorvastatin (LIPITOR) 40 MG tablet Take 1 tablet (40 mg total) by mouth daily. 90 tablet 1  . Dextromethorphan HBr (COUGH SUPPRESSANT PO) Take by mouth.    . fluticasone (FLONASE) 50 MCG/ACT nasal spray Place 1 spray into both nostrils 2 (two) times daily. 16 g 1  . fluticasone furoate-vilanterol (BREO ELLIPTA) 200-25 MCG/INH AEPB Inhale 1 puff into the lungs daily. 60 each 5  . levothyroxine (SYNTHROID, LEVOTHROID) 75 MCG tablet Take 1 tablet (75 mcg total) by mouth daily before breakfast. 90 tablet 2  . lidocaine-prilocaine (EMLA) cream Apply 1 application topically as needed. (Patient not taking: Reported on 07/13/2017) 30 g 0  . methylPREDNISolone (MEDROL) 4 MG tablet 21 tablet dose pack taper as directed (Patient not taking: Reported on 07/13/2017) 21 tablet 0  . omeprazole (PRILOSEC) 20 MG capsule Take 20 mg by mouth daily.    .Marland KitchenoxyCODONE-acetaminophen (PERCOCET/ROXICET) 5-325 MG tablet Take 1 tablet by mouth every 6 (six) hours as needed for severe pain. 30 tablet 0  . pregabalin (LYRICA) 50 MG capsule Take 1 capsule (50 mg total) by mouth 2 (two) times daily. (Patient not taking: Reported on 07/13/2017) 60 capsule 1  . prochlorperazine (COMPAZINE) 10 MG tablet Take 1 tablet (10 mg total) by mouth every 6 (six) hours as needed for nausea or vomiting. 30 tablet 0   No current facility-administered medications for this visit.  Facility-Administered Medications Ordered in Other Visits  Medication Dose Route Frequency Provider Last Rate Last Dose  . sodium chloride flush (NS) 0.9 % injection 10 mL  10 mL Intracatheter PRN Curt Bears, MD   10 mL at 06/22/17 1747    SURGICAL HISTORY:  Past Surgical History:  Procedure Laterality Date  . BREAST BIOPSY     several. Denies Hx of breast cancer.  . IR FLUORO  GUIDE PORT INSERTION RIGHT  02/04/2017  . IR US GUIDE VASC ACCESS RIGHT  02/04/2017  . THORACOTOMY/LOBECTOMY Right 2008  . TONSILLECTOMY  1960    REVIEW OF SYSTEMS:  A comprehensive review of systems was negative except for: Respiratory: positive for cough and sputum   PHYSICAL EXAMINATION: General appearance: alert, cooperative and no distress Head: Normocephalic, without obvious abnormality, atraumatic Neck: no adenopathy, no JVD, supple, symmetrical, trachea midline and thyroid not enlarged, symmetric, no tenderness/mass/nodules Lymph nodes: Cervical, supraclavicular, and axillary nodes normal. Resp: clear to auscultation bilaterally Back: symmetric, no curvature. ROM normal. No CVA tenderness. Cardio: regular rate and rhythm, S1, S2 normal, no murmur, click, rub or gallop GI: soft, non-tender; bowel sounds normal; no masses,  no organomegaly Extremities: extremities normal, atraumatic, no cyanosis or edema  ECOG PERFORMANCE STATUS: 1 - Symptomatic but completely ambulatory  Blood pressure 139/76, pulse (!) 104, temperature 97.8 F (36.6 C), temperature source Oral, resp. rate (!) 21, height '5\' 2"'$  (1.575 m), weight 138 lb 6.4 oz (62.8 kg), SpO2 96 %.  LABORATORY DATA: Lab Results  Component Value Date   WBC 10.4 (H) 07/20/2017   HGB 12.3 07/20/2017   HCT 37.6 07/20/2017   MCV 80.2 07/20/2017   PLT 493 (H) 07/20/2017      Chemistry      Component Value Date/Time   NA 143 07/06/2017 0742   NA 142 06/22/2017 1335   K 3.7 07/06/2017 0742   K 3.9 06/22/2017 1335   CL 105 07/06/2017 0742   CO2 28 07/06/2017 0742   CO2 26 06/22/2017 1335   BUN 9 07/06/2017 0742   BUN 13.6 06/22/2017 1335   CREATININE 0.74 07/06/2017 0742   CREATININE 0.7 06/22/2017 1335      Component Value Date/Time   CALCIUM 9.3 07/06/2017 0742   CALCIUM 9.4 06/22/2017 1335   ALKPHOS 90 07/06/2017 0742   ALKPHOS 93 06/22/2017 1335   AST 12 07/06/2017 0742   AST 12 06/22/2017 1335   ALT 13 07/06/2017  0742   ALT 15 06/22/2017 1335   BILITOT 0.2 07/06/2017 0742   BILITOT 0.25 06/22/2017 1335       RADIOGRAPHIC STUDIES: Ct Chest W Contrast  Result Date: 07/04/2017 CLINICAL DATA:  Left lung cancer, status post chemotherapy and XRT, immunotherapy ongoing. History of right lung cancer in 2008, status post surgery. Severe right chest pain. Chronic cough. EXAM: CT CHEST WITH CONTRAST TECHNIQUE: Multidetector CT imaging of the chest was performed during intravenous contrast administration. CONTRAST:  75 mL Isovue 300 IV COMPARISON:  04/04/2017 FINDINGS: Cardiovascular: Heart is normal in size. Rightward cardiomediastinal shift. No evidence of thoracic aortic aneurysm. Atherosclerotic calcifications of the aortic arch. Right chest port terminating at the cavoatrial junction. Mediastinum/Nodes: No suspicious mediastinal lymphadenopathy. Visualized thyroid unremarkable. Lungs/Pleura: Status post right pneumonectomy with tracheal stent extending into the right mainstem bronchus. Associated small right pleural collection. 1.7 x 2.6 cm irregular left upper lobe pulmonary nodule anteriorly (series 7/image 51), previously 1.8 x 3.9 cm. Surrounding radiation changes in the left upper lobe/paramediastinal region (series 7/image 57).  Minimal tree-in-bud nodularity in the left lower lobe (series 7/image 104), new, raising the possibility of mild superimposed infection. No pneumothorax. Upper Abdomen: Visualized upper abdomen is grossly unremarkable. Musculoskeletal: Visualized osseous structures are within normal limits. IMPRESSION: 1.7 x 2.6 cm left upper lobe pulmonary nodule, corresponding to known primary bronchogenic neoplasm, decreased. Surrounding radiation changes. Mild tree-in-bud nodularity in the left lower lobe, raising the possibility of mild superimposed infection. Status post right pneumonectomy. Aortic Atherosclerosis (ICD10-I70.0). Electronically Signed   By: Julian Hy M.D.   On: 07/04/2017 15:48     ASSESSMENT AND PLAN: This is a very pleasant 62 years old white female recently diagnosed with unresectable a stage IIB non-small cell lung cancer, adenocarcinoma. The patient underwent a course of concurrent chemoradiation with weekly carboplatin and paclitaxel status post 6 cycles.  She tolerated the treatment well and had partial response. She is currently undergoing consolidation treatment with immunotherapy with Imfinzi (Durvalumab) status post 7 cycles. The patient continues to tolerate this treatment fairly well.  I recommended for her to proceed with cycle #8 today as a schedule. For the productive cough, I will start her on Z-Pak. I will see her back for follow-up visit in 2 weeks for evaluation before starting cycle #9. The patient was advised to call immediately if she has any concerning symptoms in the interval. The patient voices understanding of current disease status and treatment options and is in agreement with the current care plan. All questions were answered. The patient knows to call the clinic with any problems, questions or concerns. We can certainly see the patient much sooner if necessary. I spent 10 minutes counseling the patient face to face. The total time spent in the appointment was 15 minutes.  Disclaimer: This note was dictated with voice recognition software. Similar sounding words can inadvertently be transcribed and may not be corrected upon review.

## 2017-07-25 ENCOUNTER — Telehealth: Payer: Self-pay | Admitting: *Deleted

## 2017-07-25 NOTE — Telephone Encounter (Signed)
Oncology Nurse Navigator Documentation  Oncology Nurse Navigator Flowsheets 07/25/2017  Navigator Location [facility]  Navigator Encounter Type Telephone/I called to follow up with Felicia Herrera regarding getting connected with support services. I was unable to reach but did leave a vm message for her to call me with my name and phone number.   Telephone Outgoing Call  Treatment Phase Treatment  Barriers/Navigation Needs Education  Education Other  Interventions Education  Education Method Verbal  Acuity Level 1  Time Spent with Patient 15

## 2017-08-03 ENCOUNTER — Encounter: Payer: Self-pay | Admitting: Internal Medicine

## 2017-08-03 ENCOUNTER — Telehealth: Payer: Self-pay

## 2017-08-03 ENCOUNTER — Inpatient Hospital Stay (HOSPITAL_BASED_OUTPATIENT_CLINIC_OR_DEPARTMENT_OTHER): Payer: Medicare Other | Admitting: Internal Medicine

## 2017-08-03 ENCOUNTER — Inpatient Hospital Stay: Payer: Medicare Other | Attending: Nurse Practitioner

## 2017-08-03 ENCOUNTER — Encounter: Payer: Self-pay | Admitting: General Practice

## 2017-08-03 ENCOUNTER — Inpatient Hospital Stay: Payer: Medicare Other

## 2017-08-03 DIAGNOSIS — C3492 Malignant neoplasm of unspecified part of left bronchus or lung: Secondary | ICD-10-CM

## 2017-08-03 DIAGNOSIS — Z923 Personal history of irradiation: Secondary | ICD-10-CM | POA: Insufficient documentation

## 2017-08-03 DIAGNOSIS — Z9221 Personal history of antineoplastic chemotherapy: Secondary | ICD-10-CM

## 2017-08-03 DIAGNOSIS — R5383 Other fatigue: Secondary | ICD-10-CM

## 2017-08-03 DIAGNOSIS — R0789 Other chest pain: Secondary | ICD-10-CM | POA: Diagnosis not present

## 2017-08-03 DIAGNOSIS — C3411 Malignant neoplasm of upper lobe, right bronchus or lung: Secondary | ICD-10-CM

## 2017-08-03 DIAGNOSIS — Z95828 Presence of other vascular implants and grafts: Secondary | ICD-10-CM

## 2017-08-03 DIAGNOSIS — C3412 Malignant neoplasm of upper lobe, left bronchus or lung: Secondary | ICD-10-CM

## 2017-08-03 DIAGNOSIS — Z79899 Other long term (current) drug therapy: Secondary | ICD-10-CM | POA: Diagnosis not present

## 2017-08-03 DIAGNOSIS — Z5112 Encounter for antineoplastic immunotherapy: Secondary | ICD-10-CM | POA: Insufficient documentation

## 2017-08-03 LAB — CBC WITH DIFFERENTIAL/PLATELET
Basophils Absolute: 0.1 10*3/uL (ref 0.0–0.1)
Basophils Relative: 1 %
EOS PCT: 2 %
Eosinophils Absolute: 0.1 10*3/uL (ref 0.0–0.5)
HEMATOCRIT: 35.5 % (ref 34.8–46.6)
HEMOGLOBIN: 11.4 g/dL — AB (ref 11.6–15.9)
LYMPHS ABS: 0.8 10*3/uL — AB (ref 0.9–3.3)
Lymphocytes Relative: 13 %
MCH: 25.7 pg (ref 25.1–34.0)
MCHC: 32.1 g/dL (ref 31.5–36.0)
MCV: 80.2 fL (ref 79.5–101.0)
Monocytes Absolute: 0.5 10*3/uL (ref 0.1–0.9)
Monocytes Relative: 9 %
NEUTROS ABS: 4.5 10*3/uL (ref 1.5–6.5)
Neutrophils Relative %: 75 %
PLATELETS: 302 10*3/uL (ref 145–400)
RBC: 4.43 MIL/uL (ref 3.70–5.45)
RDW: 16.2 % — ABNORMAL HIGH (ref 11.2–14.5)
WBC: 5.9 10*3/uL (ref 3.9–10.3)

## 2017-08-03 LAB — COMPREHENSIVE METABOLIC PANEL
ALT: 10 U/L (ref 0–55)
AST: 12 U/L (ref 5–34)
Albumin: 3.4 g/dL — ABNORMAL LOW (ref 3.5–5.0)
Alkaline Phosphatase: 86 U/L (ref 40–150)
Anion gap: 10 (ref 3–11)
BUN: 16 mg/dL (ref 7–26)
CHLORIDE: 106 mmol/L (ref 98–109)
CO2: 25 mmol/L (ref 22–29)
CREATININE: 0.73 mg/dL (ref 0.60–1.10)
Calcium: 9.2 mg/dL (ref 8.4–10.4)
GFR calc non Af Amer: >60 mL/min (ref >60)
Glucose, Bld: 97 mg/dL (ref 70–140)
Potassium: 4 mmol/L (ref 3.5–5.1)
Sodium: 141 mmol/L (ref 136–145)
Total Bilirubin: 0.4 mg/dL (ref 0.2–1.2)
Total Protein: 7.1 g/dL (ref 6.4–8.3)

## 2017-08-03 LAB — TSH: TSH: 1.855 u[IU]/mL (ref 0.308–3.960)

## 2017-08-03 MED ORDER — SODIUM CHLORIDE 0.9 % IV SOLN
Freq: Once | INTRAVENOUS | Status: AC
  Administered 2017-08-03: 11:00:00 via INTRAVENOUS

## 2017-08-03 MED ORDER — HEPARIN SOD (PORK) LOCK FLUSH 100 UNIT/ML IV SOLN
500.0000 [IU] | Freq: Once | INTRAVENOUS | Status: AC | PRN
  Administered 2017-08-03: 500 [IU]
  Filled 2017-08-03: qty 5

## 2017-08-03 MED ORDER — SODIUM CHLORIDE 0.9% FLUSH
10.0000 mL | INTRAVENOUS | Status: DC | PRN
  Administered 2017-08-03: 10 mL
  Filled 2017-08-03: qty 10

## 2017-08-03 MED ORDER — SODIUM CHLORIDE 0.9 % IV SOLN
620.0000 mg | Freq: Once | INTRAVENOUS | Status: AC
  Administered 2017-08-03: 620 mg via INTRAVENOUS
  Filled 2017-08-03: qty 10

## 2017-08-03 MED ORDER — SODIUM CHLORIDE 0.9% FLUSH
10.0000 mL | Freq: Once | INTRAVENOUS | Status: AC
  Administered 2017-08-03: 10 mL
  Filled 2017-08-03: qty 10

## 2017-08-03 MED ORDER — OXYCODONE-ACETAMINOPHEN 5-325 MG PO TABS: 1.0000 | ORAL_TABLET | Freq: Four times a day (QID) | ORAL | 0 refills | Status: DC | PRN

## 2017-08-03 NOTE — Progress Notes (Signed)
[place] [phone]   Fax:(336) [phone]  OFFICE PROGRESS NOTE  [name], [name], MD [place] [address]  DIAGNOSIS: Stage IIB (T2b, N1, M0) non-small cell lung cancer, adenocarcinoma presented with large left upper lobe lung mass in addition to left hilar lymphadenopathy diagnosed in April 2018. Her previous workup was done at [facility] in [facility]. There was insufficient material to perform the molecular studies.  GUARDANT 360 Molecular studies: BRCA2 N348f. Negative for EGFR, ALK, ROS1, BRAF mutations.  PRIOR THERAPY:  A course of concurrent chemoradiation with weekly carboplatin for AUC of 2 and paclitaxel 45 MG/M2. First dose 01/24/2017. Status post 6 cycles.  CURRENT THERAPY: Consolidation immunotherapy with Imfinzi (Durvalumab) 10 mg/KG every 2 weeks status post 8 cycles.  INTERVAL HISTORY: Felicia Bunn62y.o. female returns to the clinic today for follow-up visit.  The patient continues to tolerate her treatment with immunotherapy fairly well.  She has some weakness in her legs this morning when she started walking from a sitting position.  She denied having any chest pain, shortness of breath, cough or hemoptysis.  She denied having any weight loss or night sweats.  She has no nausea, vomiting, diarrhea or constipation.  She is here today for evaluation before starting cycle #9 of her treatment.  MEDICAL HISTORY: Past Medical History:  Diagnosis Date  . [place] 12[place]  . Cancer ([facility]    Lun cancer: Right Dx 2008, s/p lobectomy. Left Dx 09/2016  . [place] obstructive pulmonary disease) ([place]   . Encounter for antineoplastic chemotherapy 12/27/2016  . History of radiation therapy 01/24/17-03/08/17   left lung 2 Gy in 30 fractions  . Hyperlipidemia   . Hypertension   . Stroke ([facility]     ALLERGIES:  has No Known Allergies.  MEDICATIONS:  Current  Outpatient Medications  Medication Sig Dispense Refill  . aspirin EC 81 MG tablet Take 81 mg by mouth daily.    .Marland Kitchenatorvastatin (LIPITOR) 40 MG tablet Take 1 tablet (40 mg total) by mouth daily. 90 tablet 1  . Dextromethorphan HBr (COUGH SUPPRESSANT PO) Take by mouth.    . fluticasone (FLONASE) 50 MCG/ACT nasal spray Place 1 spray into both nostrils 2 (two) times daily. 16 g 1  . fluticasone furoate-vilanterol (BREO ELLIPTA) 200-25 MCG/INH AEPB Inhale 1 puff into the lungs daily. 60 each 5  . levothyroxine (SYNTHROID, LEVOTHROID) 75 MCG tablet Take 1 tablet (75 mcg total) by mouth daily before breakfast. 90 tablet 2  . lidocaine-prilocaine (EMLA) cream Apply 1 application topically as needed. 30 g 0  . omeprazole (PRILOSEC) 20 MG capsule Take 20 mg by mouth daily.    .Marland KitchenoxyCODONE-acetaminophen (PERCOCET/ROXICET) 5-325 MG tablet Take 1 tablet by mouth every 6 (six) hours as needed for severe pain. 30 tablet 0  . pregabalin (LYRICA) 50 MG capsule Take 1 capsule (50 mg total) by mouth 2 (two) times daily. 60 capsule 1  . prochlorperazine (COMPAZINE) 10 MG tablet Take 1 tablet (10 mg total) by mouth every 6 (six) hours as needed for nausea or vomiting. (Patient not taking: Reported on 08/03/2017) 30 tablet 0   No current facility-administered medications for this visit.    Facility-Administered Medications Ordered in Other Visits  Medication Dose Route Frequency Provider Last Rate Last Dose  . sodium chloride flush (NS) 0.9 % injection 10 mL  10 mL Intracatheter PRN MCurt Bears MD  10 mL at 06/22/17 1747    SURGICAL HISTORY:  Past Surgical History:  Procedure Laterality Date  . BREAST BIOPSY     several. Denies Hx of breast cancer.  . IR FLUORO GUIDE PORT INSERTION RIGHT  02/04/2017  . IR US GUIDE VASC ACCESS RIGHT  02/04/2017  . THORACOTOMY/LOBECTOMY Right 2008  . TONSILLECTOMY  1960    REVIEW OF SYSTEMS:  A comprehensive review of systems was negative except for: Musculoskeletal:  positive for muscle weakness   PHYSICAL EXAMINATION: General appearance: alert, cooperative and no distress Head: Normocephalic, without obvious abnormality, atraumatic Neck: no adenopathy, no JVD, supple, symmetrical, trachea midline and thyroid not enlarged, symmetric, no tenderness/mass/nodules Lymph nodes: Cervical, supraclavicular, and axillary nodes normal. Resp: clear to auscultation bilaterally Back: symmetric, no curvature. ROM normal. No CVA tenderness. Cardio: regular rate and rhythm, S1, S2 normal, no murmur, click, rub or gallop GI: soft, non-tender; bowel sounds normal; no masses,  no organomegaly Extremities: extremities normal, atraumatic, no cyanosis or edema  ECOG PERFORMANCE STATUS: 1 - Symptomatic but completely ambulatory  Blood pressure 138/69, pulse 97, temperature 98.5 F (36.9 C), temperature source Oral, resp. rate 18, height '5\' 2"'$  (1.575 m), weight 141 lb 8 oz (64.2 kg), SpO2 96 %.  LABORATORY DATA: Lab Results  Component Value Date   WBC 5.9 08/03/2017   HGB 11.4 (L) 08/03/2017   HCT 35.5 08/03/2017   MCV 80.2 08/03/2017   PLT 302 08/03/2017      Chemistry      Component Value Date/Time   NA 141 08/03/2017 0912   NA 142 06/22/2017 1335   K 4.0 08/03/2017 0912   K 3.9 06/22/2017 1335   CL 106 08/03/2017 0912   CO2 25 08/03/2017 0912   CO2 26 06/22/2017 1335   BUN 16 08/03/2017 0912   BUN 13.6 06/22/2017 1335   CREATININE 0.73 08/03/2017 0912   CREATININE 0.7 06/22/2017 1335      Component Value Date/Time   CALCIUM 9.2 08/03/2017 0912   CALCIUM 9.4 06/22/2017 1335   ALKPHOS 86 08/03/2017 0912   ALKPHOS 93 06/22/2017 1335   AST 12 08/03/2017 0912   AST 12 06/22/2017 1335   ALT 10 08/03/2017 0912   ALT 15 06/22/2017 1335   BILITOT 0.4 08/03/2017 0912   BILITOT 0.25 06/22/2017 1335       RADIOGRAPHIC STUDIES: Ct Chest W Contrast  Result Date: 07/04/2017 CLINICAL DATA:  Left lung cancer, status post chemotherapy and XRT, immunotherapy  ongoing. History of right lung cancer in 2008, status post surgery. Severe right chest pain. Chronic cough. EXAM: CT CHEST WITH CONTRAST TECHNIQUE: Multidetector CT imaging of the chest was performed during intravenous contrast administration. CONTRAST:  75 mL Isovue 300 IV COMPARISON:  04/04/2017 FINDINGS: Cardiovascular: Heart is normal in size. Rightward cardiomediastinal shift. No evidence of thoracic aortic aneurysm. Atherosclerotic calcifications of the aortic arch. Right chest port terminating at the cavoatrial junction. Mediastinum/Nodes: No suspicious mediastinal lymphadenopathy. Visualized thyroid unremarkable. Lungs/Pleura: Status post right pneumonectomy with tracheal stent extending into the right mainstem bronchus. Associated small right pleural collection. 1.7 x 2.6 cm irregular left upper lobe pulmonary nodule anteriorly (series 7/image 51), previously 1.8 x 3.9 cm. Surrounding radiation changes in the left upper lobe/paramediastinal region (series 7/image 57). Minimal tree-in-bud nodularity in the left lower lobe (series 7/image 104), new, raising the possibility of mild superimposed infection. No pneumothorax. Upper Abdomen: Visualized upper abdomen is grossly unremarkable. Musculoskeletal: Visualized osseous structures are within normal limits. IMPRESSION: 1.7 x 2.6  cm left upper lobe pulmonary nodule, corresponding to known primary bronchogenic neoplasm, decreased. Surrounding radiation changes. Mild tree-in-bud nodularity in the left lower lobe, raising the possibility of mild superimposed infection. Status post right pneumonectomy. Aortic Atherosclerosis (ICD10-I70.0). Electronically Signed   By: Julian Hy M.D.   On: 07/04/2017 15:48    ASSESSMENT AND PLAN: This is a very pleasant 62 years old white female recently diagnosed with unresectable a stage IIB non-small cell lung cancer, adenocarcinoma. The patient underwent a course of concurrent chemoradiation with weekly carboplatin  and paclitaxel status post 6 cycles.  She tolerated the treatment well and had partial response. She is currently undergoing consolidation treatment with immunotherapy with Imfinzi (Durvalumab) status post 8 cycles. The patient continues to tolerate this treatment fairly well with no concerning complaints. I recommended for her to proceed with cycle #9 today as a scheduled. I will see her back for follow-up visit in 2 weeks for evaluation before starting cycle #10. For pain management, I gave her a refill of Percocet. The patient voices understanding of current disease status and treatment options and is in agreement with the current care plan. All questions were answered. The patient knows to call the clinic with any problems, questions or concerns. We can certainly see the patient much sooner if necessary. I spent 10 minutes counseling the patient face to face. The total time spent in the appointment was 15 minutes.  Disclaimer: This note was dictated with voice recognition software. Similar sounding words can inadvertently be transcribed and may not be corrected upon review.

## 2017-08-03 NOTE — Progress Notes (Signed)
[place] Spiritual Care Note  Referred by Webb Silversmith Cunningham/LCSW for additional spiritual/emotional/social support. Met Felicia Herrera briefly in infusion today. She was very receptive, exclaiming, "I want to make an appointment with you!" She has my card and brochure, and plans to f/u by phone when she has a better sense of her energy and schedule. She knows that we can meet in infusion, in my office, or by phone as she desires.   [place], [place], [facility] Pager [phone] Voicemail [phone]

## 2017-08-03 NOTE — Patient Instructions (Signed)
Felicia Herrera Discharge Instructions for Patients Receiving Chemotherapy  Today you received the following chemotherapy agents durvalumab (Imfinzi)  To help prevent nausea and vomiting after your treatment, we encourage you to take your nausea medication as directed    If you develop nausea and vomiting that is not controlled by your nausea medication, call the clinic.   BELOW ARE SYMPTOMS THAT SHOULD BE REPORTED IMMEDIATELY:  *FEVER GREATER THAN 100.5 F  *CHILLS WITH OR WITHOUT FEVER  NAUSEA AND VOMITING THAT IS NOT CONTROLLED WITH YOUR NAUSEA MEDICATION  *UNUSUAL SHORTNESS OF BREATH  *UNUSUAL BRUISING OR BLEEDING  TENDERNESS IN MOUTH AND THROAT WITH OR WITHOUT PRESENCE OF ULCERS  *URINARY PROBLEMS  *BOWEL PROBLEMS  UNUSUAL RASH Items with * indicate a potential emergency and should be followed up as soon as possible.  Feel free to call the clinic should you have any questions or concerns. The clinic phone number is [phone].  Please show the [employer] at check-in to the Emergency Department and triage nurse.

## 2017-08-03 NOTE — Telephone Encounter (Signed)
Infusion will print avs and calender for patient. Per 2/13 los

## 2017-08-10 ENCOUNTER — Telehealth: Payer: Self-pay | Admitting: Internal Medicine

## 2017-08-10 NOTE — Telephone Encounter (Signed)
Patient called in to cancel appt with Dr Mickeal Skinner.

## 2017-08-11 ENCOUNTER — Ambulatory Visit: Payer: Medicare Other | Admitting: Internal Medicine

## 2017-08-11 ENCOUNTER — Other Ambulatory Visit: Payer: Self-pay | Admitting: Medical Oncology

## 2017-08-11 DIAGNOSIS — C3411 Malignant neoplasm of upper lobe, right bronchus or lung: Secondary | ICD-10-CM

## 2017-08-17 ENCOUNTER — Other Ambulatory Visit: Payer: Medicare Other

## 2017-08-17 ENCOUNTER — Inpatient Hospital Stay (HOSPITAL_BASED_OUTPATIENT_CLINIC_OR_DEPARTMENT_OTHER): Payer: Medicare Other | Admitting: Nurse Practitioner

## 2017-08-17 ENCOUNTER — Ambulatory Visit: Payer: Medicare Other | Admitting: Neurology

## 2017-08-17 ENCOUNTER — Telehealth: Payer: Self-pay | Admitting: Nurse Practitioner

## 2017-08-17 ENCOUNTER — Inpatient Hospital Stay: Payer: Medicare Other

## 2017-08-17 ENCOUNTER — Encounter: Payer: Self-pay | Admitting: Nurse Practitioner

## 2017-08-17 VITALS — BP 141/74 | HR 69 | Temp 98.3°F | Resp 17 | Ht 62.0 in | Wt 141.7 lb

## 2017-08-17 DIAGNOSIS — Z5112 Encounter for antineoplastic immunotherapy: Secondary | ICD-10-CM | POA: Diagnosis not present

## 2017-08-17 DIAGNOSIS — R0789 Other chest pain: Secondary | ICD-10-CM

## 2017-08-17 DIAGNOSIS — C3412 Malignant neoplasm of upper lobe, left bronchus or lung: Secondary | ICD-10-CM

## 2017-08-17 DIAGNOSIS — C3411 Malignant neoplasm of upper lobe, right bronchus or lung: Secondary | ICD-10-CM

## 2017-08-17 DIAGNOSIS — C3492 Malignant neoplasm of unspecified part of left bronchus or lung: Secondary | ICD-10-CM

## 2017-08-17 DIAGNOSIS — Z95828 Presence of other vascular implants and grafts: Secondary | ICD-10-CM

## 2017-08-17 DIAGNOSIS — Z9221 Personal history of antineoplastic chemotherapy: Secondary | ICD-10-CM

## 2017-08-17 DIAGNOSIS — Z923 Personal history of irradiation: Secondary | ICD-10-CM | POA: Diagnosis not present

## 2017-08-17 DIAGNOSIS — Z79899 Other long term (current) drug therapy: Secondary | ICD-10-CM | POA: Diagnosis not present

## 2017-08-17 LAB — CBC WITH DIFFERENTIAL (CANCER CENTER ONLY)
Basophils Absolute: 0 10*3/uL (ref 0.0–0.1)
Basophils Relative: 0 %
Eosinophils Absolute: 0.1 10*3/uL (ref 0.0–0.5)
Eosinophils Relative: 1 %
HEMATOCRIT: 35.3 % (ref 34.8–46.6)
Hemoglobin: 11.2 g/dL — ABNORMAL LOW (ref 11.6–15.9)
LYMPHS ABS: 0.7 10*3/uL — AB (ref 0.9–3.3)
Lymphocytes Relative: 10 %
MCH: 26.1 pg (ref 25.1–34.0)
MCHC: 31.7 g/dL (ref 31.5–36.0)
MCV: 82.3 fL (ref 79.5–101.0)
MONO ABS: 0.6 10*3/uL (ref 0.1–0.9)
MONOS PCT: 9 %
NEUTROS ABS: 5.7 10*3/uL (ref 1.5–6.5)
Neutrophils Relative %: 80 %
PLATELETS: 346 10*3/uL (ref 145–400)
RBC: 4.29 MIL/uL (ref 3.70–5.45)
RDW: 17.3 % — ABNORMAL HIGH (ref 11.2–14.5)
WBC Count: 7.1 10*3/uL (ref 3.9–10.3)

## 2017-08-17 LAB — CMP (CANCER CENTER ONLY)
ALT: 11 U/L (ref 0–55)
AST: 11 U/L (ref 5–34)
Albumin: 3.4 g/dL — ABNORMAL LOW (ref 3.5–5.0)
Alkaline Phosphatase: 88 U/L (ref 40–150)
Anion gap: 9 (ref 3–11)
BILIRUBIN TOTAL: 0.4 mg/dL (ref 0.2–1.2)
BUN: 21 mg/dL (ref 7–26)
CO2: 25 mmol/L (ref 22–29)
Calcium: 9.3 mg/dL (ref 8.4–10.4)
Chloride: 106 mmol/L (ref 98–109)
Creatinine: 0.77 mg/dL (ref 0.60–1.10)
Glucose, Bld: 117 mg/dL (ref 70–140)
Potassium: 3.9 mmol/L (ref 3.5–5.1)
Sodium: 140 mmol/L (ref 136–145)
TOTAL PROTEIN: 7 g/dL (ref 6.4–8.3)

## 2017-08-17 MED ORDER — SODIUM CHLORIDE 0.9 % IV SOLN: Freq: Once | INTRAVENOUS | Status: DC

## 2017-08-17 MED ORDER — SODIUM CHLORIDE 0.9% FLUSH
10.0000 mL | INTRAVENOUS | Status: DC | PRN
  Administered 2017-08-17: 10 mL
  Filled 2017-08-17: qty 10

## 2017-08-17 MED ORDER — SODIUM CHLORIDE 0.9% FLUSH
10.0000 mL | Freq: Once | INTRAVENOUS | Status: AC
  Administered 2017-08-17: 10 mL
  Filled 2017-08-17: qty 10

## 2017-08-17 MED ORDER — HEPARIN SOD (PORK) LOCK FLUSH 100 UNIT/ML IV SOLN
500.0000 [IU] | Freq: Once | INTRAVENOUS | Status: AC | PRN
  Administered 2017-08-17: 500 [IU]
  Filled 2017-08-17: qty 5

## 2017-08-17 MED ORDER — SODIUM CHLORIDE 0.9 % IV SOLN
620.0000 mg | Freq: Once | INTRAVENOUS | Status: AC
  Administered 2017-08-17: 620 mg via INTRAVENOUS
  Filled 2017-08-17: qty 2.4

## 2017-08-17 NOTE — Telephone Encounter (Signed)
No los. 2/27

## 2017-08-17 NOTE — Progress Notes (Signed)
  [place] OFFICE PROGRESS NOTE  DIAGNOSIS: Stage IIB(T2b, N1, M0) non-small cell lung cancer, adenocarcinoma presented with large left upper lobe lung mass in addition to left hilar lymphadenopathy diagnosed in April 2018. Her previous workup was done at [facility] in [facility]. There was insufficient material to perform the molecular studies.  GUARDANT 360 Molecular studies: BRCA2 N342f. Negative for EGFR, ALK, ROS1, BRAF mutations.  PRIOR THERAPY:  A course of concurrent chemoradiation with weekly carboplatin for AUC of 2 and paclitaxel 45 MG/M2. First dose 01/24/2017. Status post 6 cycles.  CURRENT THERAPY: Consolidation immunotherapy with Imfinzi (Durvalumab) 10 mg/KG every 2 weeks status post 9 cycles.   INTERVAL HISTORY:   Ms. MBrisbinreturns as scheduled.  She completed cycle 9 Durvalumab 08/03/2017.  He denies nausea/vomiting.  No mouth sores.  No diarrhea.  No rash.  She has stable dyspnea on exertion.  No fever.  She continues to have a cough.  She notes improvement with her cough medication.  She continues to have right-sided chest wall pain.  The pain is worse at nighttime.  She takes 1 Percocet and is able to sleep.  She does not require Percocet during the day.  Objective:  Vital signs in last 24 hours:  Blood pressure (!) 141/74, pulse 69, temperature 98.3 F (36.8 C), temperature source Oral, resp. rate 17, height '5\' 2"'$  (1.575 m), weight 141 lb 11.2 oz (64.3 kg), SpO2 97 %.    HEENT: No thrush or ulcers. Lymphatics: No palpable cervical or supraclavicular lymph nodes. Resp: Lungs clear bilaterally. Cardio: Regular rate and rhythm. GI: Abdomen soft and nontender.  No hepatomegaly. Vascular: No leg edema.  Calves soft and nontender. Neuro: Alert and oriented. Skin: No rash. Port-A-Cath without erythema.  Lab Results:  Lab Results  Component Value Date   WBC 7.1 08/17/2017   HGB 11.4 (L) 08/03/2017   HCT 35.3  08/17/2017   MCV 82.3 08/17/2017   PLT 346 08/17/2017   NEUTROABS 5.7 08/17/2017    Imaging:  No results found.  Medications: I have reviewed the patient's current medications.  Assessment/Plan: 1. Unresectable stage IIb non-small cell lung cancer, adenocarcinoma, status post concurrent chemoradiation with weekly carboplatin/Taxol x7 cycles; initiation of consolidation Durvalumab 04/13/2017; 9 cycles completed to date. 2. Right chest wall pain.  She continues Percocet as needed.   Disposition: Ms. MAldermanhas completed 9 cycles of consolidation Durvalumab.  Plan to proceed with cycle 10 today as scheduled.  She continues to have right chest wall pain.  She will continue Percocet as needed.  She will return for labs, follow-up and the next cycle of Durvalumab in 2 weeks.  She will contact the office in the interim with any problems.    LNed CardANP/GNP-BC   08/17/2017  12:41 PM

## 2017-08-17 NOTE — Patient Instructions (Signed)
[place] [facility] Discharge Instructions for Patients Receiving Chemotherapy  Today you received the following chemotherapy agents durvalumab (Imfinzi)  To help prevent nausea and vomiting after your treatment, we encourage you to take your nausea medication as directed    If you develop nausea and vomiting that is not controlled by your nausea medication, call the clinic.   BELOW ARE SYMPTOMS THAT SHOULD BE REPORTED IMMEDIATELY:  *FEVER GREATER THAN 100.5 F  *CHILLS WITH OR WITHOUT FEVER  NAUSEA AND VOMITING THAT IS NOT CONTROLLED WITH YOUR NAUSEA MEDICATION  *UNUSUAL SHORTNESS OF BREATH  *UNUSUAL BRUISING OR BLEEDING  TENDERNESS IN MOUTH AND THROAT WITH OR WITHOUT PRESENCE OF ULCERS  *URINARY PROBLEMS  *BOWEL PROBLEMS  UNUSUAL RASH Items with * indicate a potential emergency and should be followed up as soon as possible.  Feel free to call the clinic should you have any questions or concerns. The clinic phone number is [phone].  Please show the [facility] at check-in to the Emergency Department and triage nurse.

## 2017-08-31 ENCOUNTER — Telehealth: Payer: Self-pay

## 2017-08-31 ENCOUNTER — Inpatient Hospital Stay: Payer: Medicare Other

## 2017-08-31 ENCOUNTER — Encounter: Payer: Self-pay | Admitting: Internal Medicine

## 2017-08-31 ENCOUNTER — Inpatient Hospital Stay (HOSPITAL_BASED_OUTPATIENT_CLINIC_OR_DEPARTMENT_OTHER): Payer: Medicare Other | Admitting: Internal Medicine

## 2017-08-31 ENCOUNTER — Encounter: Payer: Self-pay | Admitting: General Practice

## 2017-08-31 ENCOUNTER — Inpatient Hospital Stay: Payer: Medicare Other | Attending: Nurse Practitioner

## 2017-08-31 VITALS — BP 163/86 | HR 98 | Temp 97.8°F | Resp 18 | Ht 62.0 in | Wt 142.6 lb

## 2017-08-31 DIAGNOSIS — C3412 Malignant neoplasm of upper lobe, left bronchus or lung: Secondary | ICD-10-CM | POA: Insufficient documentation

## 2017-08-31 DIAGNOSIS — C3492 Malignant neoplasm of unspecified part of left bronchus or lung: Secondary | ICD-10-CM

## 2017-08-31 DIAGNOSIS — Z79899 Other long term (current) drug therapy: Secondary | ICD-10-CM | POA: Insufficient documentation

## 2017-08-31 DIAGNOSIS — Z95828 Presence of other vascular implants and grafts: Secondary | ICD-10-CM

## 2017-08-31 DIAGNOSIS — Z923 Personal history of irradiation: Secondary | ICD-10-CM | POA: Diagnosis not present

## 2017-08-31 DIAGNOSIS — Z8673 Personal history of transient ischemic attack (TIA), and cerebral infarction without residual deficits: Secondary | ICD-10-CM | POA: Diagnosis not present

## 2017-08-31 DIAGNOSIS — E785 Hyperlipidemia, unspecified: Secondary | ICD-10-CM | POA: Insufficient documentation

## 2017-08-31 DIAGNOSIS — R5382 Chronic fatigue, unspecified: Secondary | ICD-10-CM

## 2017-08-31 DIAGNOSIS — Z9221 Personal history of antineoplastic chemotherapy: Secondary | ICD-10-CM

## 2017-08-31 DIAGNOSIS — Z7982 Long term (current) use of aspirin: Secondary | ICD-10-CM | POA: Diagnosis not present

## 2017-08-31 DIAGNOSIS — Z5112 Encounter for antineoplastic immunotherapy: Secondary | ICD-10-CM | POA: Diagnosis not present

## 2017-08-31 DIAGNOSIS — J449 Chronic obstructive pulmonary disease, unspecified: Secondary | ICD-10-CM | POA: Diagnosis not present

## 2017-08-31 DIAGNOSIS — I10 Essential (primary) hypertension: Secondary | ICD-10-CM | POA: Diagnosis not present

## 2017-08-31 DIAGNOSIS — R5383 Other fatigue: Secondary | ICD-10-CM

## 2017-08-31 DIAGNOSIS — C3411 Malignant neoplasm of upper lobe, right bronchus or lung: Secondary | ICD-10-CM

## 2017-08-31 LAB — CBC WITH DIFFERENTIAL (CANCER CENTER ONLY)
BASOS ABS: 0 10*3/uL (ref 0.0–0.1)
Basophils Relative: 1 %
EOS PCT: 2 %
Eosinophils Absolute: 0.1 10*3/uL (ref 0.0–0.5)
HCT: 35.5 % (ref 34.8–46.6)
Hemoglobin: 11.3 g/dL — ABNORMAL LOW (ref 11.6–15.9)
Lymphocytes Relative: 12 %
Lymphs Abs: 0.9 10*3/uL (ref 0.9–3.3)
MCH: 26.3 pg (ref 25.1–34.0)
MCHC: 31.8 g/dL (ref 31.5–36.0)
MCV: 82.6 fL (ref 79.5–101.0)
MONO ABS: 0.6 10*3/uL (ref 0.1–0.9)
Monocytes Relative: 8 %
NEUTROS ABS: 6.4 10*3/uL (ref 1.5–6.5)
Neutrophils Relative %: 77 %
PLATELETS: 359 10*3/uL (ref 145–400)
RBC: 4.3 MIL/uL (ref 3.70–5.45)
RDW: 18.1 % — AB (ref 11.2–14.5)
WBC: 8.1 10*3/uL (ref 3.9–10.3)

## 2017-08-31 LAB — COMPREHENSIVE METABOLIC PANEL
ALK PHOS: 89 U/L (ref 40–150)
ALT: 9 U/L (ref 0–55)
ANION GAP: 6 (ref 3–11)
AST: 13 U/L (ref 5–34)
Albumin: 3.5 g/dL (ref 3.5–5.0)
BILIRUBIN TOTAL: 0.4 mg/dL (ref 0.2–1.2)
BUN: 12 mg/dL (ref 7–26)
CALCIUM: 9.9 mg/dL (ref 8.4–10.4)
CO2: 30 mmol/L — ABNORMAL HIGH (ref 22–29)
Chloride: 104 mmol/L (ref 98–109)
Creatinine, Ser: 0.72 mg/dL (ref 0.60–1.10)
GFR calc non Af Amer: >60 mL/min (ref >60)
GLUCOSE: 98 mg/dL (ref 70–140)
Potassium: 3.6 mmol/L (ref 3.5–5.1)
Sodium: 140 mmol/L (ref 136–145)
TOTAL PROTEIN: 7.3 g/dL (ref 6.4–8.3)

## 2017-08-31 LAB — TSH: TSH: 2.17 u[IU]/mL (ref 0.308–3.960)

## 2017-08-31 MED ORDER — HEPARIN SOD (PORK) LOCK FLUSH 100 UNIT/ML IV SOLN
500.0000 [IU] | Freq: Once | INTRAVENOUS | Status: AC | PRN
  Administered 2017-08-31: 500 [IU]
  Filled 2017-08-31: qty 5

## 2017-08-31 MED ORDER — SODIUM CHLORIDE 0.9% FLUSH
10.0000 mL | INTRAVENOUS | Status: DC | PRN
  Administered 2017-08-31: 10 mL
  Filled 2017-08-31: qty 10

## 2017-08-31 MED ORDER — OXYCODONE-ACETAMINOPHEN 5-325 MG PO TABS: 1.0000 | ORAL_TABLET | Freq: Four times a day (QID) | ORAL | 0 refills | Status: DC | PRN

## 2017-08-31 MED ORDER — SODIUM CHLORIDE 0.9 % IV SOLN
Freq: Once | INTRAVENOUS | Status: AC
  Administered 2017-08-31: 10:00:00 via INTRAVENOUS

## 2017-08-31 MED ORDER — SODIUM CHLORIDE 0.9 % IV SOLN
620.0000 mg | Freq: Once | INTRAVENOUS | Status: AC
  Administered 2017-08-31: 620 mg via INTRAVENOUS
  Filled 2017-08-31: qty 10

## 2017-08-31 MED ORDER — SODIUM CHLORIDE 0.9% FLUSH
10.0000 mL | Freq: Once | INTRAVENOUS | Status: AC
  Administered 2017-08-31: 10 mL
  Filled 2017-08-31: qty 10

## 2017-08-31 NOTE — Patient Instructions (Signed)
[facility] Discharge Instructions for Patients Receiving Chemotherapy  Today you received the following chemotherapy agents durvalumab (Imfinzi)  To help prevent nausea and vomiting after your treatment, we encourage you to take your nausea medication as directed    If you develop nausea and vomiting that is not controlled by your nausea medication, call the clinic.   BELOW ARE SYMPTOMS THAT SHOULD BE REPORTED IMMEDIATELY:  *FEVER GREATER THAN 100.5 F  *CHILLS WITH OR WITHOUT FEVER  NAUSEA AND VOMITING THAT IS NOT CONTROLLED WITH YOUR NAUSEA MEDICATION  *UNUSUAL SHORTNESS OF BREATH  *UNUSUAL BRUISING OR BLEEDING  TENDERNESS IN MOUTH AND THROAT WITH OR WITHOUT PRESENCE OF ULCERS  *URINARY PROBLEMS  *BOWEL PROBLEMS  UNUSUAL RASH Items with * indicate a potential emergency and should be followed up as soon as possible.  Feel free to call the clinic should you have any questions or concerns. The clinic phone number is [phone].  Please show the [facility] at check-in to the Emergency Department and triage nurse.

## 2017-08-31 NOTE — Telephone Encounter (Signed)
Patient already schedule march- April. Per 3/13 los.Printed avs and calender of upcoming appointment.

## 2017-08-31 NOTE — Progress Notes (Signed)
[facility] CSW Progress Notes  Follow up visit w patient while in infusion room.  Patient reports she is doing "much better."  Using [employer] transport to get out independently which has lifted her mood significantly.  Planning to attend [employer] activities in future.  Attended support group at local church which is "too far away"; however, plans to connect w local church community.  Reports that she is encouraged and coping w stress of treatment well.  Edwyna Shell, LCSW Clinical Social Worker Phone:  [phone]

## 2017-08-31 NOTE — Progress Notes (Signed)
[facility] [phone]   [phone]  OFFICE PROGRESS NOTE  Herrera, [name], MD [place] [address]  DIAGNOSIS: Stage IIB (T2b, N1, M0) non-small cell lung cancer, adenocarcinoma presented with large left upper lobe lung mass in addition to left hilar lymphadenopathy diagnosed in April 2018. Her previous workup was done at [facility] in [facility]. There was insufficient material to perform the molecular studies.  GUARDANT 360 Molecular studies: BRCA2 N337f. Negative for EGFR, ALK, ROS1, BRAF mutations.  PRIOR THERAPY:  A course of concurrent chemoradiation with weekly carboplatin for AUC of 2 and paclitaxel 45 MG/M2. First dose 01/24/2017. Status post 6 cycles.  CURRENT THERAPY: Consolidation immunotherapy with Imfinzi (Durvalumab) 10 mg/KG every 2 weeks status post 10 cycles.  INTERVAL HISTORY: Felicia Fudala89y.o. female returns to the clinic today for follow-up visit.  The patient is feeling fine today with no specific complaints.  She denied having any chest pain, shortness of breath, cough or hemoptysis.  She denied having any fever or chills.  She has no nausea, vomiting, diarrhea or constipation.  She continues to tolerate her treatment with immunotherapy fairly well.  She is here today for evaluation before starting cycle #11.   MEDICAL HISTORY: Past Medical History:  Diagnosis Date  . [place], [place] 2 [place] 12[place]  . Cancer ( [facility]    Lun cancer: Right Dx 2008, s/p lobectomy. Left Dx 09/2016  . COPD (chronic obstructive pulmonary disease) ([place]   . Encounter for antineoplastic chemotherapy 12/27/2016  . History of radiation therapy 01/24/17-03/08/17   left lung 2 Gy in 30 fractions  . Hyperlipidemia   . Hypertension   . Stroke ([facility]     ALLERGIES:  has No Known Allergies.  MEDICATIONS:  Current Outpatient Medications  Medication Sig Dispense Refill  . aspirin  EC 81 MG tablet Take 81 mg by mouth daily.    .Marland Kitchenatorvastatin (LIPITOR) 40 MG tablet Take 1 tablet (40 mg total) by mouth daily. 90 tablet 1  . Dextromethorphan HBr (COUGH SUPPRESSANT PO) Take by mouth.    . fluticasone (FLONASE) 50 MCG/ACT nasal spray Place 1 spray into both nostrils 2 (two) times daily. 16 g 1  . fluticasone furoate-vilanterol (BREO ELLIPTA) 200-25 MCG/INH AEPB Inhale 1 puff into the lungs daily. 60 each 5  . levothyroxine (SYNTHROID, LEVOTHROID) 75 MCG tablet Take 1 tablet (75 mcg total) by mouth daily before breakfast. 90 tablet 2  . lidocaine-prilocaine (EMLA) cream Apply 1 application topically as needed. 30 g 0  . omeprazole (PRILOSEC) 20 MG capsule Take 20 mg by mouth daily.    .Marland KitchenoxyCODONE-acetaminophen (PERCOCET/ROXICET) 5-325 MG tablet Take 1 tablet by mouth every 6 (six) hours as needed for severe pain. 30 tablet 0  . pregabalin (LYRICA) 50 MG capsule Take 1 capsule (50 mg total) by mouth 2 (two) times daily. 60 capsule 1  . prochlorperazine (COMPAZINE) 10 MG tablet Take 1 tablet (10 mg total) by mouth every 6 (six) hours as needed for nausea or vomiting. 30 tablet 0   No current facility-administered medications for this visit.    Facility-Administered Medications Ordered in Other Visits  Medication Dose Route Frequency Provider Last Rate Last Dose  . sodium chloride flush (NS) 0.9 % injection 10 mL  10 mL Intracatheter PRN MCurt Bears MD   10 mL at 06/22/17 1747    SURGICAL HISTORY:  Past Surgical History:  Procedure Laterality Date  .  BREAST BIOPSY     several. Denies Hx of breast cancer.  . IR FLUORO GUIDE PORT INSERTION RIGHT  02/04/2017  . IR US GUIDE VASC ACCESS RIGHT  02/04/2017  . THORACOTOMY/LOBECTOMY Right 2008  . TONSILLECTOMY  1960    REVIEW OF SYSTEMS:  A comprehensive review of systems was negative except for: Constitutional: positive for fatigue   PHYSICAL EXAMINATION: General appearance: alert, cooperative and no distress Head:  Normocephalic, without obvious abnormality, atraumatic Neck: no adenopathy, no JVD, supple, symmetrical, trachea midline and thyroid not enlarged, symmetric, no tenderness/mass/nodules Lymph nodes: Cervical, supraclavicular, and axillary nodes normal. Resp: clear to auscultation bilaterally Back: symmetric, no curvature. ROM normal. No CVA tenderness. Cardio: regular rate and rhythm, S1, S2 normal, no murmur, click, rub or gallop GI: soft, non-tender; bowel sounds normal; no masses,  no organomegaly Extremities: extremities normal, atraumatic, no cyanosis or edema  ECOG PERFORMANCE STATUS: 1 - Symptomatic but completely ambulatory  Blood pressure (!) 163/86, pulse 98, temperature 97.8 F (36.6 C), temperature source Oral, resp. rate 18, height '5\' 2"'$  (1.575 m), weight 142 lb 9.6 oz (64.7 kg), SpO2 94 %.  LABORATORY DATA: Lab Results  Component Value Date   WBC 7.1 08/17/2017   HGB 11.4 (L) 08/03/2017   HCT 35.3 08/17/2017   MCV 82.3 08/17/2017   PLT 346 08/17/2017      Chemistry      Component Value Date/Time   NA 140 08/17/2017 1124   NA 142 06/22/2017 1335   K 3.9 08/17/2017 1124   K 3.9 06/22/2017 1335   CL 106 08/17/2017 1124   CO2 25 08/17/2017 1124   CO2 26 06/22/2017 1335   BUN 21 08/17/2017 1124   BUN 13.6 06/22/2017 1335   CREATININE 0.77 08/17/2017 1124   CREATININE 0.7 06/22/2017 1335      Component Value Date/Time   CALCIUM 9.3 08/17/2017 1124   CALCIUM 9.4 06/22/2017 1335   ALKPHOS 88 08/17/2017 1124   ALKPHOS 93 06/22/2017 1335   AST 11 08/17/2017 1124   AST 12 06/22/2017 1335   ALT 11 08/17/2017 1124   ALT 15 06/22/2017 1335   BILITOT 0.4 08/17/2017 1124   BILITOT 0.25 06/22/2017 1335       RADIOGRAPHIC STUDIES: No results found.  ASSESSMENT AND PLAN: This is a very pleasant 62 years old white female recently diagnosed with unresectable a stage IIB non-small cell lung cancer, adenocarcinoma. The patient underwent a course of concurrent  chemoradiation with weekly carboplatin and paclitaxel status post 6 cycles.  She tolerated the treatment well and had partial response. She is currently undergoing consolidation treatment with immunotherapy with Imfinzi (Durvalumab) status post 10 cycles. She continues to tolerate her treatment well with no concerning complaints.  I recommended for the patient to proceed with cycle #11 today as a scheduled. I will see her back for follow-up visit in 2 weeks for evaluation before the next cycle of her treatment. For pain management, I gave her a refill of Percocet. She was advised to call immediately if she has any concerning symptoms in the interval. The patient voices understanding of current disease status and treatment options and is in agreement with the current care plan. All questions were answered. The patient knows to call the clinic with any problems, questions or concerns. We can certainly see the patient much sooner if necessary. I spent 10 minutes counseling the patient face to face. The total time spent in the appointment was 15 minutes.  Disclaimer: This note was  dictated with voice recognition software. Similar sounding words can inadvertently be transcribed and may not be corrected upon review.       

## 2017-09-08 ENCOUNTER — Telehealth: Payer: Self-pay

## 2017-09-08 NOTE — Telephone Encounter (Signed)
Received call from patient "Can I have an antibiotic called in to [facility]? I just don't feel well". Patient reports symptoms of coughing, tickle in throat, and sore sinues. Patient states symptoms are identical to those that she has when she has "sinusitis". Dr. Julien Nordmann contacted and giving above info. Dr. Julien Nordmann recommends patient be seen in Symptom management clinic, urgent care, or PCP  Office. Patient declines to visit clinic today stating "I'd rather stay home in the warmth with my bath robe on. I'm not going to come in today. I'll see what I feel like tomorrow." Patient advised to call back if any worsening symptoms or report to ER for any urgent symptoms. Patient verbalized understanding.

## 2017-09-12 ENCOUNTER — Other Ambulatory Visit: Payer: Self-pay | Admitting: Family Medicine

## 2017-09-12 DIAGNOSIS — Z8709 Personal history of other diseases of the respiratory system: Secondary | ICD-10-CM

## 2017-09-14 ENCOUNTER — Encounter: Payer: Self-pay | Admitting: Oncology

## 2017-09-14 ENCOUNTER — Inpatient Hospital Stay (HOSPITAL_BASED_OUTPATIENT_CLINIC_OR_DEPARTMENT_OTHER): Payer: Medicare Other | Admitting: Oncology

## 2017-09-14 ENCOUNTER — Inpatient Hospital Stay: Payer: Medicare Other

## 2017-09-14 ENCOUNTER — Ambulatory Visit: Payer: Medicare Other

## 2017-09-14 VITALS — BP 147/67 | HR 80 | Temp 98.5°F | Resp 18 | Ht 62.0 in | Wt 141.9 lb

## 2017-09-14 DIAGNOSIS — C3411 Malignant neoplasm of upper lobe, right bronchus or lung: Secondary | ICD-10-CM | POA: Diagnosis not present

## 2017-09-14 DIAGNOSIS — Z7982 Long term (current) use of aspirin: Secondary | ICD-10-CM | POA: Diagnosis not present

## 2017-09-14 DIAGNOSIS — Z8673 Personal history of transient ischemic attack (TIA), and cerebral infarction without residual deficits: Secondary | ICD-10-CM | POA: Diagnosis not present

## 2017-09-14 DIAGNOSIS — Z5112 Encounter for antineoplastic immunotherapy: Secondary | ICD-10-CM

## 2017-09-14 DIAGNOSIS — I10 Essential (primary) hypertension: Secondary | ICD-10-CM | POA: Diagnosis not present

## 2017-09-14 DIAGNOSIS — R5382 Chronic fatigue, unspecified: Secondary | ICD-10-CM

## 2017-09-14 DIAGNOSIS — Z79899 Other long term (current) drug therapy: Secondary | ICD-10-CM

## 2017-09-14 DIAGNOSIS — C3492 Malignant neoplasm of unspecified part of left bronchus or lung: Secondary | ICD-10-CM

## 2017-09-14 DIAGNOSIS — E785 Hyperlipidemia, unspecified: Secondary | ICD-10-CM

## 2017-09-14 DIAGNOSIS — J449 Chronic obstructive pulmonary disease, unspecified: Secondary | ICD-10-CM | POA: Diagnosis not present

## 2017-09-14 DIAGNOSIS — C3412 Malignant neoplasm of upper lobe, left bronchus or lung: Secondary | ICD-10-CM | POA: Diagnosis not present

## 2017-09-14 LAB — CBC WITH DIFFERENTIAL (CANCER CENTER ONLY)
BASOS PCT: 1 %
Basophils Absolute: 0 10*3/uL (ref 0.0–0.1)
EOS ABS: 0.1 10*3/uL (ref 0.0–0.5)
Eosinophils Relative: 3 %
HCT: 35 % (ref 34.8–46.6)
HEMOGLOBIN: 11.3 g/dL — AB (ref 11.6–15.9)
LYMPHS ABS: 0.9 10*3/uL (ref 0.9–3.3)
Lymphocytes Relative: 17 %
MCH: 26.8 pg (ref 25.1–34.0)
MCHC: 32.3 g/dL (ref 31.5–36.0)
MCV: 82.9 fL (ref 79.5–101.0)
Monocytes Absolute: 0.5 10*3/uL (ref 0.1–0.9)
Monocytes Relative: 10 %
NEUTROS PCT: 69 %
Neutro Abs: 3.8 10*3/uL (ref 1.5–6.5)
Platelet Count: 343 10*3/uL (ref 145–400)
RBC: 4.22 MIL/uL (ref 3.70–5.45)
RDW: 18.4 % — ABNORMAL HIGH (ref 11.2–14.5)
WBC: 5.4 10*3/uL (ref 3.9–10.3)

## 2017-09-14 LAB — CMP (CANCER CENTER ONLY)
ALT: 11 U/L (ref 0–55)
AST: 15 U/L (ref 5–34)
Albumin: 3.6 g/dL (ref 3.5–5.0)
Alkaline Phosphatase: 91 U/L (ref 40–150)
Anion gap: 12 — ABNORMAL HIGH (ref 3–11)
BUN: 16 mg/dL (ref 7–26)
CHLORIDE: 105 mmol/L (ref 98–109)
CO2: 25 mmol/L (ref 22–29)
CREATININE: 0.77 mg/dL (ref 0.60–1.10)
Calcium: 9.5 mg/dL (ref 8.4–10.4)
Glucose, Bld: 91 mg/dL (ref 70–140)
Potassium: 3.9 mmol/L (ref 3.5–5.1)
Sodium: 142 mmol/L (ref 136–145)
Total Bilirubin: 0.5 mg/dL (ref 0.2–1.2)
Total Protein: 7.2 g/dL (ref 6.4–8.3)

## 2017-09-14 LAB — TSH: TSH: 1.703 u[IU]/mL (ref 0.308–3.960)

## 2017-09-14 MED ORDER — SODIUM CHLORIDE 0.9% FLUSH
10.0000 mL | INTRAVENOUS | Status: DC | PRN
  Administered 2017-09-14: 10 mL
  Filled 2017-09-14: qty 10

## 2017-09-14 MED ORDER — SODIUM CHLORIDE 0.9 % IV SOLN
Freq: Once | INTRAVENOUS | Status: AC
  Administered 2017-09-14: 11:00:00 via INTRAVENOUS

## 2017-09-14 MED ORDER — HEPARIN SOD (PORK) LOCK FLUSH 100 UNIT/ML IV SOLN
500.0000 [IU] | Freq: Once | INTRAVENOUS | Status: AC | PRN
  Administered 2017-09-14: 500 [IU]
  Filled 2017-09-14: qty 5

## 2017-09-14 MED ORDER — SODIUM CHLORIDE 0.9 % IV SOLN
620.0000 mg | Freq: Once | INTRAVENOUS | Status: AC
  Administered 2017-09-14: 620 mg via INTRAVENOUS
  Filled 2017-09-14: qty 2.4

## 2017-09-14 NOTE — Patient Instructions (Signed)
[place] [facility] Discharge Instructions for Patients Receiving Chemotherapy  Today you received the following chemotherapy agents durvalumab (Imfinzi)  To help prevent nausea and vomiting after your treatment, we encourage you to take your nausea medication as directed    If you develop nausea and vomiting that is not controlled by your nausea medication, call the clinic.   BELOW ARE SYMPTOMS THAT SHOULD BE REPORTED IMMEDIATELY:  *FEVER GREATER THAN 100.5 F  *CHILLS WITH OR WITHOUT FEVER  NAUSEA AND VOMITING THAT IS NOT CONTROLLED WITH YOUR NAUSEA MEDICATION  *UNUSUAL SHORTNESS OF BREATH  *UNUSUAL BRUISING OR BLEEDING  TENDERNESS IN MOUTH AND THROAT WITH OR WITHOUT PRESENCE OF ULCERS  *URINARY PROBLEMS  *BOWEL PROBLEMS  UNUSUAL RASH Items with * indicate a potential emergency and should be followed up as soon as possible.  Feel free to call the clinic should you have any questions or concerns. The clinic phone number is [phone].  Please show the [facility] at check-in to the Emergency Department and triage nurse.

## 2017-09-14 NOTE — Assessment & Plan Note (Signed)
This is a very pleasant 62 year old white female recently diagnosed with unresectable a stage IIB non-small cell lung cancer, adenocarcinoma. The patient underwent a course of concurrent chemoradiation with weekly carboplatin and paclitaxel status post 6 cycles.  She tolerated the treatment well and had partial response. She is currently undergoing consolidation treatment with immunotherapy with Imfinzi (Durvalumab) status post 11 cycles. She continues to tolerate her treatment well with no concerning complaints.  I recommended for the patient to proceed with cycle #12 today as a scheduled. The patient will have a restaging CT scan of the chest prior to her next visit. She will follow-up in 2 weeks for evaluation prior to her next cycle of treatment and to review her restaging CT scan of the chest.  For pain management, she will continue Percocet as needed.  She was advised to call immediately if she has any concerning symptoms in the interval. The patient voices understanding of current disease status and treatment options and is in agreement with the current care plan. All questions were answered. The patient knows to call the clinic with any problems, questions or concerns. We can certainly see the patient much sooner if necessary.

## 2017-09-14 NOTE — Progress Notes (Signed)
[facility] OFFICE PROGRESS NOTE  [name], [name], MD [place] [address]  DIAGNOSIS: Stage IIB(T2b, N1, M0) non-small cell lung cancer, adenocarcinoma presented with large left upper lobe lung mass in addition to left hilar lymphadenopathy diagnosed in April 2018. Her previous workup was done at [facility] in [facility]. There was insufficient material to perform the molecular studies.  GUARDANT 360 Molecular studies: BRCA2 N347f. Negative for EGFR, ALK, ROS1, BRAF mutations.  PRIOR THERAPY: A course of concurrent chemoradiation with weekly carboplatin for AUC of 2 and paclitaxel 45 MG/M2. First dose 01/24/2017. Status post 6 cycles.  CURRENT THERAPY: Consolidation immunotherapy with Imfinzi (Durvalumab) 10 mg/KG every 2 weeks status post 11 cycles.  INTERVAL HISTORY: CVerlin Duke62y.o. female returns for routine follow-up visit by herself.  The patient is feeling fine today and has no specific complaints except for her ongoing neuropathic pain.  She uses oxycodone as needed.  Denies fevers and chills.  Denies chest pain, shortness breath, cough, hemoptysis.  Denies nausea, vomiting, constipation, diarrhea.  Denies skin rashes.  She continues to tolerate her treatment with Imfinzi fairly well.  The patient is here for evaluation prior to starting cycle #12.  MEDICAL HISTORY: Past Medical History:  Diagnosis Date  . [place], [place] 2 [place] 12[place]  . Cancer ([facility]    Lun cancer: Right Dx 2008, s/p lobectomy. Left Dx 09/2016  . [place] disease) [place]   . Encounter for antineoplastic chemotherapy 12/27/2016  . History of radiation therapy 01/24/17-03/08/17   left lung 2 Gy in 30 fractions  . Hyperlipidemia   . Hypertension   . Stroke ([facility]     ALLERGIES:  has No Known Allergies.  MEDICATIONS:  Current Outpatient Medications  Medication Sig Dispense Refill  . aspirin EC  81 MG tablet Take 81 mg by mouth daily.    .Marland Kitchenatorvastatin (LIPITOR) 40 MG tablet Take 1 tablet (40 mg total) by mouth daily. 90 tablet 1  . BREO ELLIPTA 200-25 MCG/INH AEPB INHALE ONE DOSE BY MOUTH DAILY 60 each 0  . Dextromethorphan HBr (COUGH SUPPRESSANT PO) Take by mouth.    . fluticasone (FLONASE) 50 MCG/ACT nasal spray Place 1 spray into both nostrils 2 (two) times daily. 16 g 1  . fluticasone furoate-vilanterol (BREO ELLIPTA) 200-25 MCG/INH AEPB Inhale 1 puff into the lungs daily. 60 each 5  . levothyroxine (SYNTHROID, LEVOTHROID) 75 MCG tablet Take 1 tablet (75 mcg total) by mouth daily before breakfast. 90 tablet 2  . lidocaine-prilocaine (EMLA) cream Apply 1 application topically as needed. 30 g 0  . omeprazole (PRILOSEC) 20 MG capsule Take 20 mg by mouth daily.    .Marland KitchenoxyCODONE-acetaminophen (PERCOCET/ROXICET) 5-325 MG tablet Take 1 tablet by mouth every 6 (six) hours as needed for severe pain. 30 tablet 0  . pregabalin (LYRICA) 50 MG capsule Take 1 capsule (50 mg total) by mouth 2 (two) times daily. 60 capsule 1  . prochlorperazine (COMPAZINE) 10 MG tablet Take 1 tablet (10 mg total) by mouth every 6 (six) hours as needed for nausea or vomiting. 30 tablet 0   No current facility-administered medications for this visit.    Facility-Administered Medications Ordered in Other Visits  Medication Dose Route Frequency Provider Last Rate Last Dose  . sodium chloride flush (NS) 0.9 % injection 10 mL  10 mL Intracatheter PRN MCurt Bears MD   10 mL at 06/22/17 1747    SURGICAL HISTORY:  Past Surgical History:  Procedure Laterality Date  . BREAST BIOPSY     several. Denies Hx of breast cancer.  . IR FLUORO GUIDE PORT INSERTION RIGHT  02/04/2017  . IR US GUIDE VASC ACCESS RIGHT  02/04/2017  . THORACOTOMY/LOBECTOMY Right 2008  . TONSILLECTOMY  1960    REVIEW OF SYSTEMS:   Review of Systems  Constitutional: Negative for appetite change, chills, fatigue, fever and unexpected weight  change.  HENT:   Negative for mouth sores, nosebleeds, sore throat and trouble swallowing.   Eyes: Negative for eye problems and icterus.  Respiratory: Negative for cough, hemoptysis, shortness of breath and wheezing.   Cardiovascular: Negative for chest pain and leg swelling.  Gastrointestinal: Negative for abdominal pain, constipation, diarrhea, nausea and vomiting.  Genitourinary: Negative for bladder incontinence, difficulty urinating, dysuria, frequency and hematuria.   Musculoskeletal: Negative for back pain, gait problem, neck pain and neck stiffness.  Skin: Negative for itching and rash.  Neurological: Negative for dizziness, extremity weakness, gait problem, headaches, light-headedness and seizures. Positive for neuropathic pain to her right chest. Hematological: Negative for adenopathy. Does not bruise/bleed easily.  Psychiatric/Behavioral: Negative for confusion, depression and sleep disturbance. The patient is not nervous/anxious.     PHYSICAL EXAMINATION:  Blood pressure (!) 147/67, pulse 80, temperature 98.5 F (36.9 C), temperature source Oral, resp. rate 18, height 5' 2" (1.575 m), weight 141 lb 14.4 oz (64.4 kg), SpO2 98 %.  ECOG PERFORMANCE STATUS: 1 - Symptomatic but completely ambulatory  Physical Exam  Constitutional: Oriented to person, place, and time and well-developed, well-nourished, and in no distress. No distress.  HENT:  Head: Normocephalic and atraumatic.  Mouth/Throat: Oropharynx is clear and moist. No oropharyngeal exudate.  Eyes: Conjunctivae are normal. Right eye exhibits no discharge. Left eye exhibits no discharge. No scleral icterus.  Neck: Normal range of motion. Neck supple.  Cardiovascular: Normal rate, regular rhythm, normal heart sounds and intact distal pulses.   Pulmonary/Chest: Effort normal and breath sounds normal. No respiratory distress. No wheezes. No rales.  Abdominal: Soft. Bowel sounds are normal. Exhibits no distension and no mass.  There is no tenderness.  Musculoskeletal: Normal range of motion. Exhibits no edema.  Lymphadenopathy:    No cervical adenopathy.  Neurological: Alert and oriented to person, place, and time. Exhibits normal muscle tone. Gait normal. Coordination normal.  Skin: Skin is warm and dry. No rash noted. Not diaphoretic. No erythema. No pallor.  Psychiatric: Mood, memory and judgment normal.  Vitals reviewed.  LABORATORY DATA: Lab Results  Component Value Date   WBC 5.4 09/14/2017   HGB 11.4 (L) 08/03/2017   HCT 35.0 09/14/2017   MCV 82.9 09/14/2017   PLT 343 09/14/2017      Chemistry      Component Value Date/Time   NA 142 09/14/2017 0921   NA 142 06/22/2017 1335   K 3.9 09/14/2017 0921   K 3.9 06/22/2017 1335   CL 105 09/14/2017 0921   CO2 25 09/14/2017 0921   CO2 26 06/22/2017 1335   BUN 16 09/14/2017 0921   BUN 13.6 06/22/2017 1335   CREATININE 0.77 09/14/2017 0921   CREATININE 0.7 06/22/2017 1335      Component Value Date/Time   CALCIUM 9.5 09/14/2017 0921   CALCIUM 9.4 06/22/2017 1335   ALKPHOS 91 09/14/2017 0921   ALKPHOS 93 06/22/2017 1335   AST 15 09/14/2017 0921   AST 12 06/22/2017 1335   ALT 11 09/14/2017 0921   ALT 15 06/22/2017 1335  BILITOT 0.5 09/14/2017 0921   BILITOT 0.25 06/22/2017 1335       RADIOGRAPHIC STUDIES:  No results found.   ASSESSMENT/PLAN:  Lung cancer [facility]) This is a very pleasant 63 year old white female recently diagnosed with unresectable a stage IIB non-small cell lung cancer, adenocarcinoma. The patient underwent a course of concurrent chemoradiation with weekly carboplatin and paclitaxel status post 6 cycles.  She tolerated the treatment well and had partial response. She is currently undergoing consolidation treatment with immunotherapy with Imfinzi (Durvalumab) status post 11 cycles. She continues to tolerate her treatment well with no concerning complaints.  I recommended for the patient to proceed with cycle #12 today as a  scheduled. The patient will have a restaging CT scan of the chest prior to her next visit. She will follow-up in 2 weeks for evaluation prior to her next cycle of treatment and to review her restaging CT scan of the chest.  For pain management, she will continue Percocet as needed.  She was advised to call immediately if she has any concerning symptoms in the interval. The patient voices understanding of current disease status and treatment options and is in agreement with the current care plan. All questions were answered. The patient knows to call the clinic with any problems, questions or concerns. We can certainly see the patient much sooner if necessary.   Orders Placed This Encounter  Procedures  . CT CHEST W CONTRAST    Standing Status:   Future    Standing Expiration Date:   09/15/2018    Order Specific Question:   If indicated for the ordered procedure, I authorize the administration of contrast media per Radiology protocol    Answer:   Yes    Order Specific Question:   Preferred imaging location?    Answer:   [facility]    Order Specific Question:   Radiology Contrast Protocol - do NOT remove file path    Answer:   [email or web address]    Order Specific Question:   Reason for Exam additional comments    Answer:   Lung cancer. Restaging.    Order Specific Question:   Is patient pregnant?    Answer:   No   Mikey Bussing, DNP, AGPCNP-BC, AOCNP 09/14/17

## 2017-09-14 NOTE — Patient Instructions (Signed)
Implanted Port Home Guide An implanted port is a type of central line that is placed under the skin. Central lines are used to provide IV access when treatment or nutrition needs to be given through a person's veins. Implanted ports are used for long-term IV access. An implanted port may be placed because:  You need IV medicine that would be irritating to the small veins in your hands or arms.  You need long-term IV medicines, such as antibiotics.  You need IV nutrition for a long period.  You need frequent blood draws for lab tests.  You need dialysis.  Implanted ports are usually placed in the chest area, but they can also be placed in the upper arm, the abdomen, or the leg. An implanted port has two main parts:  Reservoir. The reservoir is round and will appear as a small, raised area under your skin. The reservoir is the part where a needle is inserted to give medicines or draw blood.  Catheter. The catheter is a thin, flexible tube that extends from the reservoir. The catheter is placed into a large vein. Medicine that is inserted into the reservoir goes into the catheter and then into the vein.  How will I care for my incision site? Do not get the incision site wet. Bathe or shower as directed by your health care provider. How is my port accessed? Special steps must be taken to access the port:  Before the port is accessed, a numbing cream can be placed on the skin. This helps numb the skin over the port site.  Your health care provider uses a sterile technique to access the port. ? Your health care provider must put on a mask and sterile gloves. ? The skin over your port is cleaned carefully with an antiseptic and allowed to dry. ? The port is gently pinched between sterile gloves, and a needle is inserted into the port.  Only "non-coring" port needles should be used to access the port. Once the port is accessed, a blood return should be checked. This helps ensure that the port  is in the vein and is not clogged.  If your port needs to remain accessed for a constant infusion, a clear (transparent) bandage will be placed over the needle site. The bandage and needle will need to be changed every week, or as directed by your health care provider.  Keep the bandage covering the needle clean and dry. Do not get it wet. Follow your health care provider's instructions on how to take a shower or bath while the port is accessed.  If your port does not need to stay accessed, no bandage is needed over the port.  What is flushing? Flushing helps keep the port from getting clogged. Follow your health care provider's instructions on how and when to flush the port. Ports are usually flushed with saline solution or a medicine called heparin. The need for flushing will depend on how the port is used.  If the port is used for intermittent medicines or blood draws, the port will need to be flushed: ? After medicines have been given. ? After blood has been drawn. ? As part of routine maintenance.  If a constant infusion is running, the port may not need to be flushed.  How long will my port stay implanted? The port can stay in for as long as your health care provider thinks it is needed. When it is time for the port to come out, surgery will be   done to remove it. The procedure is similar to the one performed when the port was put in. When should I seek immediate medical care? When you have an implanted port, you should seek immediate medical care if:  You notice a bad smell coming from the incision site.  You have swelling, redness, or drainage at the incision site.  You have more swelling or pain at the port site or the surrounding area.  You have a fever that is not controlled with medicine.  This information is not intended to replace advice given to you by your health care provider. Make sure you discuss any questions you have with your health care provider. Document  Released: 06/07/2005 Document Revised: 11/13/2015 Document Reviewed: 02/12/2013 Elsevier Interactive Patient Education  2017 [employer].  

## 2017-09-15 ENCOUNTER — Telehealth: Payer: Self-pay | Admitting: *Deleted

## 2017-09-15 NOTE — Telephone Encounter (Signed)
Pharmacy requesting new Rx's for the following medications: BREO ELLIPTA INH 200-25 MCG, LIPITOR 40 MG TAB, FLONASE 50 MCG, PRILOSEC 20 MG CAP LEVOTHYROXINE 75 MCG TAB  LAST OV: 11/04/2016

## 2017-09-18 NOTE — Telephone Encounter (Signed)
I think she follows with pulmonologist.  Felicia Herrera

## 2017-09-19 ENCOUNTER — Other Ambulatory Visit: Payer: Self-pay | Admitting: Family Medicine

## 2017-09-20 ENCOUNTER — Telehealth: Payer: Self-pay | Admitting: *Deleted

## 2017-09-20 NOTE — Telephone Encounter (Signed)
"  The NP told me I'd receive a call to schedule scans.  No one has called yet."  Radiology Central scheduling number provided before call transfer.  Scheduler provided patient insurance information.

## 2017-09-23 ENCOUNTER — Ambulatory Visit (HOSPITAL_COMMUNITY)
Admission: RE | Admit: 2017-09-23 | Discharge: 2017-09-23 | Disposition: A | Discharge status: Home / Self Care | Payer: Medicare Other | Source: Ambulatory Visit | Attending: Oncology | Admitting: Oncology

## 2017-09-23 DIAGNOSIS — Z923 Personal history of irradiation: Secondary | ICD-10-CM | POA: Diagnosis not present

## 2017-09-23 DIAGNOSIS — I700 Atherosclerosis of aorta: Secondary | ICD-10-CM | POA: Diagnosis not present

## 2017-09-23 DIAGNOSIS — C3411 Malignant neoplasm of upper lobe, right bronchus or lung: Secondary | ICD-10-CM | POA: Insufficient documentation

## 2017-09-23 DIAGNOSIS — R918 Other nonspecific abnormal finding of lung field: Secondary | ICD-10-CM | POA: Insufficient documentation

## 2017-09-23 DIAGNOSIS — Z902 Acquired absence of lung [part of]: Secondary | ICD-10-CM | POA: Insufficient documentation

## 2017-09-23 DIAGNOSIS — C3492 Malignant neoplasm of unspecified part of left bronchus or lung: Secondary | ICD-10-CM | POA: Diagnosis not present

## 2017-09-23 DIAGNOSIS — J439 Emphysema, unspecified: Secondary | ICD-10-CM | POA: Diagnosis not present

## 2017-09-23 MED ORDER — IOHEXOL 300 MG/ML  SOLN
75.0000 mL | Freq: Once | INTRAMUSCULAR | Status: AC | PRN
  Administered 2017-09-23: 75 mL via INTRAVENOUS

## 2017-09-28 ENCOUNTER — Encounter: Payer: Self-pay | Admitting: Internal Medicine

## 2017-09-28 ENCOUNTER — Inpatient Hospital Stay: Payer: Medicare Other

## 2017-09-28 ENCOUNTER — Telehealth: Payer: Self-pay | Admitting: Internal Medicine

## 2017-09-28 ENCOUNTER — Inpatient Hospital Stay: Payer: Medicare Other | Attending: Nurse Practitioner | Admitting: Internal Medicine

## 2017-09-28 ENCOUNTER — Other Ambulatory Visit: Payer: Self-pay | Admitting: Internal Medicine

## 2017-09-28 VITALS — BP 142/68 | HR 79 | Temp 98.6°F | Resp 18 | Ht 62.0 in | Wt 142.2 lb

## 2017-09-28 DIAGNOSIS — J449 Chronic obstructive pulmonary disease, unspecified: Secondary | ICD-10-CM | POA: Diagnosis not present

## 2017-09-28 DIAGNOSIS — Z79899 Other long term (current) drug therapy: Secondary | ICD-10-CM | POA: Insufficient documentation

## 2017-09-28 DIAGNOSIS — Z95828 Presence of other vascular implants and grafts: Secondary | ICD-10-CM

## 2017-09-28 DIAGNOSIS — E785 Hyperlipidemia, unspecified: Secondary | ICD-10-CM | POA: Diagnosis not present

## 2017-09-28 DIAGNOSIS — C3411 Malignant neoplasm of upper lobe, right bronchus or lung: Secondary | ICD-10-CM

## 2017-09-28 DIAGNOSIS — I700 Atherosclerosis of aorta: Secondary | ICD-10-CM | POA: Diagnosis not present

## 2017-09-28 DIAGNOSIS — Z8673 Personal history of transient ischemic attack (TIA), and cerebral infarction without residual deficits: Secondary | ICD-10-CM | POA: Diagnosis not present

## 2017-09-28 DIAGNOSIS — Z923 Personal history of irradiation: Secondary | ICD-10-CM | POA: Insufficient documentation

## 2017-09-28 DIAGNOSIS — C3412 Malignant neoplasm of upper lobe, left bronchus or lung: Secondary | ICD-10-CM | POA: Insufficient documentation

## 2017-09-28 DIAGNOSIS — Z5112 Encounter for antineoplastic immunotherapy: Secondary | ICD-10-CM | POA: Diagnosis not present

## 2017-09-28 DIAGNOSIS — I10 Essential (primary) hypertension: Secondary | ICD-10-CM

## 2017-09-28 DIAGNOSIS — C3492 Malignant neoplasm of unspecified part of left bronchus or lung: Secondary | ICD-10-CM

## 2017-09-28 DIAGNOSIS — Z7982 Long term (current) use of aspirin: Secondary | ICD-10-CM | POA: Insufficient documentation

## 2017-09-28 DIAGNOSIS — Z9221 Personal history of antineoplastic chemotherapy: Secondary | ICD-10-CM | POA: Diagnosis not present

## 2017-09-28 LAB — CMP (CANCER CENTER ONLY)
ALBUMIN: 3.7 g/dL (ref 3.5–5.0)
ALT: 13 U/L (ref 0–55)
ANION GAP: 10 (ref 3–11)
AST: 14 U/L (ref 5–34)
Alkaline Phosphatase: 93 U/L (ref 40–150)
BILIRUBIN TOTAL: 0.3 mg/dL (ref 0.2–1.2)
BUN: 18 mg/dL (ref 7–26)
CHLORIDE: 107 mmol/L (ref 98–109)
CO2: 25 mmol/L (ref 22–29)
Calcium: 9.4 mg/dL (ref 8.4–10.4)
Creatinine: 0.76 mg/dL (ref 0.60–1.10)
GFR, Est AFR Am: >60 mL/min (ref >60)
GFR, Estimated: >60 mL/min (ref >60)
GLUCOSE: 114 mg/dL (ref 70–140)
POTASSIUM: 3.9 mmol/L (ref 3.5–5.1)
SODIUM: 142 mmol/L (ref 136–145)
TOTAL PROTEIN: 7.2 g/dL (ref 6.4–8.3)

## 2017-09-28 LAB — CBC WITH DIFFERENTIAL (CANCER CENTER ONLY)
BASOS ABS: 0.1 10*3/uL (ref 0.0–0.1)
Basophils Relative: 1 %
EOS PCT: 2 %
Eosinophils Absolute: 0.1 10*3/uL (ref 0.0–0.5)
HEMATOCRIT: 34.7 % — AB (ref 34.8–46.6)
Hemoglobin: 11.4 g/dL — ABNORMAL LOW (ref 11.6–15.9)
LYMPHS ABS: 0.8 10*3/uL — AB (ref 0.9–3.3)
LYMPHS PCT: 14 %
MCH: 27 pg (ref 25.1–34.0)
MCHC: 32.8 g/dL (ref 31.5–36.0)
MCV: 82.5 fL (ref 79.5–101.0)
Monocytes Absolute: 0.6 10*3/uL (ref 0.1–0.9)
Monocytes Relative: 9 %
Neutro Abs: 4.5 10*3/uL (ref 1.5–6.5)
Neutrophils Relative %: 74 %
PLATELETS: 311 10*3/uL (ref 145–400)
RBC: 4.2 MIL/uL (ref 3.70–5.45)
RDW: 19.5 % — ABNORMAL HIGH (ref 11.2–14.5)
WBC Count: 6 10*3/uL (ref 3.9–10.3)

## 2017-09-28 MED ORDER — SODIUM CHLORIDE 0.9 % IV SOLN
Freq: Once | INTRAVENOUS | Status: AC
  Administered 2017-09-28: 10:00:00 via INTRAVENOUS

## 2017-09-28 MED ORDER — OMEPRAZOLE 20 MG PO CPDR: 20.0000 mg | DELAYED_RELEASE_CAPSULE | Freq: Every day | ORAL | 1 refills | Status: DC

## 2017-09-28 MED ORDER — HEPARIN SOD (PORK) LOCK FLUSH 100 UNIT/ML IV SOLN
500.0000 [IU] | Freq: Once | INTRAVENOUS | Status: AC | PRN
  Administered 2017-09-28: 500 [IU]
  Filled 2017-09-28: qty 5

## 2017-09-28 MED ORDER — SODIUM CHLORIDE 0.9 % IV SOLN
620.0000 mg | Freq: Once | INTRAVENOUS | Status: AC
  Administered 2017-09-28: 620 mg via INTRAVENOUS
  Filled 2017-09-28: qty 10

## 2017-09-28 MED ORDER — SODIUM CHLORIDE 0.9% FLUSH
10.0000 mL | Freq: Once | INTRAVENOUS | Status: AC
  Administered 2017-09-28: 10 mL
  Filled 2017-09-28: qty 10

## 2017-09-28 MED ORDER — SODIUM CHLORIDE 0.9% FLUSH
10.0000 mL | INTRAVENOUS | Status: DC | PRN
  Administered 2017-09-28: 10 mL
  Filled 2017-09-28: qty 10

## 2017-09-28 NOTE — Telephone Encounter (Signed)
Appts already scheduled per 4/10 los - Gave patient AVS and calender per los.

## 2017-09-28 NOTE — Patient Instructions (Addendum)
[place] Discharge Instructions for Patients Receiving Chemotherapy  Today you received the following chemotherapy agents: Durvalumab (Imfinzi)   To help prevent nausea and vomiting after your treatment, we encourage you to take your nausea medication as directed  If you develop nausea and vomiting that is not controlled by your nausea medication, call the clinic.   BELOW ARE SYMPTOMS THAT SHOULD BE REPORTED IMMEDIATELY:  *FEVER GREATER THAN 100.5 F  *CHILLS WITH OR WITHOUT FEVER  NAUSEA AND VOMITING THAT IS NOT CONTROLLED WITH YOUR NAUSEA MEDICATION  *UNUSUAL SHORTNESS OF BREATH  *UNUSUAL BRUISING OR BLEEDING  TENDERNESS IN MOUTH AND THROAT WITH OR WITHOUT PRESENCE OF ULCERS  *URINARY PROBLEMS  *BOWEL PROBLEMS  UNUSUAL RASH Items with * indicate a potential emergency and should be followed up as soon as possible.  Feel free to call the clinic you have any questions or concerns. The clinic phone number is [phone].

## 2017-09-28 NOTE — Progress Notes (Signed)
[place] Telephone:(336) [phone]   Fax:(336) [phone]  OFFICE PROGRESS NOTE  [name], [name], MD [place] [address]  DIAGNOSIS: Stage IIB (T2b, N1, M0) non-small cell lung cancer, adenocarcinoma presented with large left upper lobe lung mass in addition to left hilar lymphadenopathy diagnosed in April 2018. Her previous workup was done at [facility] in [facility]. There was insufficient material to perform the molecular studies.  GUARDANT 360 Molecular studies: BRCA2 N387f. Negative for EGFR, ALK, ROS1, BRAF mutations.  PRIOR THERAPY:  A course of concurrent chemoradiation with weekly carboplatin for AUC of 2 and paclitaxel 45 MG/M2. First dose 01/24/2017. Status post 6 cycles.  CURRENT THERAPY: Consolidation immunotherapy with Imfinzi (Durvalumab) 10 mg/KG every 2 weeks status post 12 cycles.  INTERVAL HISTORY: Felicia Mancini89y.o. female returns to the clinic today for follow-up visit.  She is feeling fine with no specific complaints.  She continues to tolerate her treatment with Imfinzi (Durvalumab) fairly well.  She joined the [employer] now doing exercise at regular basis.  She denied having any chest pain, shortness of breath, cough or hemoptysis.  She denied having any fever or chills.  She has no nausea, vomiting, diarrhea or constipation.  She has no significant weight loss or night sweats.  The patient had repeat CT scan of the chest performed recently and she is here for evaluation and discussion of her scan results.  MEDICAL HISTORY: Past Medical History:  Diagnosis Date  . Adenocarcinoma of left lung, stage 2 ([facility] 12/27/2016  . Cancer ([facility]    Lun cancer: Right Dx 2008, s/p lobectomy. Left Dx 09/2016  . COPD [place]ch[place] obstructive pulmonary disease) ([place]   . Encounter for antineoplastic chemotherapy 12/27/2016  . History of radiation therapy 01/24/17-03/08/17   left lung 2 Gy in 30 fractions  .  Hyperlipidemia   . Hypertension   . Stroke ([facility]     ALLERGIES:  has No Known Allergies.  MEDICATIONS:  Current Outpatient Medications  Medication Sig Dispense Refill  . aspirin EC 81 MG tablet Take 81 mg by mouth daily.    .Marland Kitchenatorvastatin (LIPITOR) 40 MG tablet Take 1 tablet (40 mg total) by mouth daily. 90 tablet 1  . BREO ELLIPTA 200-25 MCG/INH AEPB INHALE ONE DOSE BY MOUTH DAILY 60 each 0  . Dextromethorphan HBr (COUGH SUPPRESSANT PO) Take by mouth.    . fluticasone (FLONASE) 50 MCG/ACT nasal spray Place 1 spray into both nostrils 2 (two) times daily. 16 g 1  . fluticasone furoate-vilanterol (BREO ELLIPTA) 200-25 MCG/INH AEPB Inhale 1 puff into the lungs daily. 60 each 5  . levothyroxine (SYNTHROID, LEVOTHROID) 75 MCG tablet Take 1 tablet (75 mcg total) by mouth daily before breakfast. 90 tablet 2  . lidocaine-prilocaine (EMLA) cream Apply 1 application topically as needed. 30 g 0  . omeprazole (PRILOSEC) 20 MG capsule Take 20 mg by mouth daily.    .Marland KitchenoxyCODONE-acetaminophen (PERCOCET/ROXICET) 5-325 MG tablet Take 1 tablet by mouth every 6 (six) hours as needed for severe pain. 30 tablet 0  . pregabalin (LYRICA) 50 MG capsule Take 1 capsule (50 mg total) by mouth 2 (two) times daily. 60 capsule 1  . prochlorperazine (COMPAZINE) 10 MG tablet Take 1 tablet (10 mg total) by mouth every 6 (six) hours as needed for nausea or vomiting. 30 tablet 0   No current facility-administered medications for this visit.    Facility-Administered Medications Ordered in Other Visits  Medication Dose Route Frequency Provider Last Rate Last Dose  . sodium chloride flush (NS) 0.9 % injection 10 mL  10 mL Intracatheter PRN Curt Bears, MD   10 mL at 06/22/17 1747    SURGICAL HISTORY:  Past Surgical History:  Procedure Laterality Date  . BREAST BIOPSY     several. Denies Hx of breast cancer.  . IR FLUORO GUIDE PORT INSERTION RIGHT  02/04/2017  . IR US GUIDE VASC ACCESS RIGHT  02/04/2017  .  THORACOTOMY/LOBECTOMY Right 2008  . TONSILLECTOMY  1960    REVIEW OF SYSTEMS:  Constitutional: negative Eyes: negative Ears, nose, mouth, throat, and face: negative Respiratory: negative Cardiovascular: negative Gastrointestinal: negative Genitourinary:negative Integument/breast: negative Hematologic/lymphatic: negative Musculoskeletal:negative Neurological: negative Behavioral/Psych: negative Endocrine: negative Allergic/Immunologic: negative   PHYSICAL EXAMINATION: General appearance: alert, cooperative and no distress Head: Normocephalic, without obvious abnormality, atraumatic Neck: no adenopathy, no JVD, supple, symmetrical, trachea midline and thyroid not enlarged, symmetric, no tenderness/mass/nodules Lymph nodes: Cervical, supraclavicular, and axillary nodes normal. Resp: clear to auscultation bilaterally Back: symmetric, no curvature. ROM normal. No CVA tenderness. Cardio: regular rate and rhythm, S1, S2 normal, no murmur, click, rub or gallop GI: soft, non-tender; bowel sounds normal; no masses,  no organomegaly Extremities: extremities normal, atraumatic, no cyanosis or edema Neurologic: Alert and oriented X 3, normal strength and tone. Normal symmetric reflexes. Normal coordination and gait  ECOG PERFORMANCE STATUS: 1 - Symptomatic but completely ambulatory  Blood pressure (!) 142/68, pulse 79, temperature 98.6 F (37 C), temperature source Oral, resp. rate 18, height _0  (1.575 m), weight 142 lb 3.2 oz (64.5 kg), SpO2 97 %.  LABORATORY DATA: Lab Results  Component Value Date   WBC 6.0 09/28/2017   HGB 11.4 (L) 08/03/2017   HCT 34.7 (L) 09/28/2017   MCV 82.5 09/28/2017   PLT 311 09/28/2017      Chemistry      Component Value Date/Time   NA 142 09/14/2017 0921   NA 142 06/22/2017 1335   K 3.9 09/14/2017 0921   K 3.9 06/22/2017 1335   CL 105 09/14/2017 0921   CO2 25 09/14/2017 0921   CO2 26 06/22/2017 1335   BUN 16 09/14/2017 0921   BUN 13.6  06/22/2017 1335   CREATININE 0.77 09/14/2017 0921   CREATININE 0.7 06/22/2017 1335      Component Value Date/Time   CALCIUM 9.5 09/14/2017 0921   CALCIUM 9.4 06/22/2017 1335   ALKPHOS 91 09/14/2017 0921   ALKPHOS 93 06/22/2017 1335   AST 15 09/14/2017 0921   AST 12 06/22/2017 1335   ALT 11 09/14/2017 0921   ALT 15 06/22/2017 1335   BILITOT 0.5 09/14/2017 0921   BILITOT 0.25 06/22/2017 1335       RADIOGRAPHIC STUDIES: Ct Chest W Contrast  Result Date: 09/23/2017 CLINICAL DATA:  Followup left lung cancer. Status post radiation therapy. Immunotherapy. Remote history of right lung cancer. EXAM: CT CHEST WITH CONTRAST TECHNIQUE: Multidetector CT imaging of the chest was performed during intravenous contrast administration. CONTRAST:  59m OMNIPAQUE IOHEXOL 300 MG/ML  SOLN COMPARISON:  CT scan 07/04/2017 FINDINGS: Cardiovascular: The heart is normal in size. No pericardial effusion. Stable tortuosity and calcification of the thoracic aorta. Stable coronary artery calcifications. Mediastinum/Nodes: No mediastinal or hilar mass or lymphadenopathy. Small scattered lymph nodes are stable. Lungs/Pleura: Stable surgical changes from right pneumonectomy. There is a stent in the trachea and right mainstem bronchus. Stable residual fluid and soft tissue debris in the right pleural space. The left lung  demonstrates hyperexpansion cross the midline. Ill-defined mass in the left lung measures 23.5 x 12 mm and previously measured 39.5 x 18 mm. Surrounding radiation changes are noted. No new/acute pulmonary findings. New 8 mm nodule at the left lung base on image number 105 could be a metastatic focus. No other pulmonary nodules are demonstrated. Upper Abdomen: No significant upper abdominal findings. No hepatic or adrenal gland lesions. Musculoskeletal: No breast masses, supraclavicular or axillary lymphadenopathy. No significant bony findings. No evidence of osseous metastatic disease. Slight progressive superior  endplate fracture involving an upper thoracic vertebral body. IMPRESSION: 1. Slight further regression of the left lung lesion with surrounding radiation changes. 2. Stable surgical changes from a right pneumonectomy. 3. New 8 mm pulmonary nodule at the left lung base, potential metastatic focus. Short-term followup chest CT suggested in 3 months. 4. No enlarged mediastinal/hilar lymph nodes and no evidence of upper abdominal metastatic disease. 5. Stable emphysematous changes in the left lung. Aortic Atherosclerosis (ICD10-I70.0) and Emphysema (ICD10-J43.9). Electronically Signed   By: Marijo Sanes M.D.   On: 09/23/2017 13:05    ASSESSMENT AND PLAN: This is a very pleasant 62 years old white female recently diagnosed with unresectable a stage IIB non-small cell lung cancer, adenocarcinoma. The patient underwent a course of concurrent chemoradiation with weekly carboplatin and paclitaxel status post 6 cycles.  She tolerated the treatment well and had partial response. She is currently undergoing consolidation treatment with immunotherapy with Imfinzi (Durvalumab) status post 12 cycles. The patient continues to tolerate this treatment well with no concerning complaints. She had repeat CT scan of the chest that showed no concerning findings for disease progression except for questionable new 8 mm pulmonary nodules at the left lung base concerning for potential metastatic focus and close follow-up was recommended. I personally and independently reviewed the scan images and discussed the results with the patient today.  I recommended for her to continue her current treatment with Imfinzi (Durvalumab) for now.  I will monitor the new pulmonary nodule closely on the upcoming scan. For hypertension, she was advised to take her blood pressure medication as prescribed and to consult with her primary care physician for adjustment of her medication if needed. She will come back for follow-up visit in 2 weeks for  evaluation before starting cycle #14. The patient was advised to call immediately if she has any concerning symptoms in the interval. The patient voices understanding of current disease status and treatment options and is in agreement with the current care plan. All questions were answered. The patient knows to call the clinic with any problems, questions or concerns. We can certainly see the patient much sooner if necessary.  Disclaimer: This note was dictated with voice recognition software. Similar sounding words can inadvertently be transcribed and may not be corrected upon review.

## 2017-09-30 ENCOUNTER — Other Ambulatory Visit: Payer: Self-pay | Admitting: Oncology

## 2017-09-30 ENCOUNTER — Telehealth: Payer: Self-pay | Admitting: *Deleted

## 2017-09-30 DIAGNOSIS — C3492 Malignant neoplasm of unspecified part of left bronchus or lung: Secondary | ICD-10-CM

## 2017-09-30 MED ORDER — OXYCODONE-ACETAMINOPHEN 5-325 MG PO TABS: 1.0000 | ORAL_TABLET | Freq: Four times a day (QID) | ORAL | 0 refills | Status: DC | PRN

## 2017-09-30 NOTE — Telephone Encounter (Signed)
"  I'm calling for a refill of pain medication."

## 2017-09-30 NOTE — Telephone Encounter (Signed)
"  I'm trying to have a prescription refilled."

## 2017-10-03 ENCOUNTER — Telehealth: Payer: Self-pay | Admitting: Medical Oncology

## 2017-10-03 NOTE — Telephone Encounter (Signed)
LVM to call back with name of sleep med needed.

## 2017-10-04 ENCOUNTER — Telehealth: Payer: Self-pay | Admitting: Medical Oncology

## 2017-10-04 NOTE — Telephone Encounter (Signed)
Pt notified that rx ready.

## 2017-10-06 ENCOUNTER — Encounter: Payer: Self-pay | Admitting: General Practice

## 2017-10-06 NOTE — Progress Notes (Signed)
[place] Note  Set 1:30 appt in my office on Wednesday 4/14 per pt request.   Suzette Battiest, Elias-Fela Solis, [facility] Pager [phone] Voicemail [phone]

## 2017-10-12 ENCOUNTER — Encounter: Payer: Self-pay | Admitting: Internal Medicine

## 2017-10-12 ENCOUNTER — Inpatient Hospital Stay: Payer: Medicare Other

## 2017-10-12 ENCOUNTER — Inpatient Hospital Stay (HOSPITAL_BASED_OUTPATIENT_CLINIC_OR_DEPARTMENT_OTHER): Payer: Medicare Other | Admitting: Internal Medicine

## 2017-10-12 ENCOUNTER — Telehealth: Payer: Self-pay | Admitting: Internal Medicine

## 2017-10-12 DIAGNOSIS — Z79899 Other long term (current) drug therapy: Secondary | ICD-10-CM | POA: Diagnosis not present

## 2017-10-12 DIAGNOSIS — Z9221 Personal history of antineoplastic chemotherapy: Secondary | ICD-10-CM

## 2017-10-12 DIAGNOSIS — I10 Essential (primary) hypertension: Secondary | ICD-10-CM | POA: Diagnosis not present

## 2017-10-12 DIAGNOSIS — Z7982 Long term (current) use of aspirin: Secondary | ICD-10-CM | POA: Diagnosis not present

## 2017-10-12 DIAGNOSIS — C3492 Malignant neoplasm of unspecified part of left bronchus or lung: Secondary | ICD-10-CM

## 2017-10-12 DIAGNOSIS — Z8673 Personal history of transient ischemic attack (TIA), and cerebral infarction without residual deficits: Secondary | ICD-10-CM | POA: Diagnosis not present

## 2017-10-12 DIAGNOSIS — I700 Atherosclerosis of aorta: Secondary | ICD-10-CM | POA: Diagnosis not present

## 2017-10-12 DIAGNOSIS — C3412 Malignant neoplasm of upper lobe, left bronchus or lung: Secondary | ICD-10-CM | POA: Diagnosis not present

## 2017-10-12 DIAGNOSIS — Z923 Personal history of irradiation: Secondary | ICD-10-CM | POA: Diagnosis not present

## 2017-10-12 DIAGNOSIS — J449 Chronic obstructive pulmonary disease, unspecified: Secondary | ICD-10-CM

## 2017-10-12 DIAGNOSIS — E785 Hyperlipidemia, unspecified: Secondary | ICD-10-CM

## 2017-10-12 DIAGNOSIS — Z5112 Encounter for antineoplastic immunotherapy: Secondary | ICD-10-CM | POA: Diagnosis not present

## 2017-10-12 DIAGNOSIS — R5382 Chronic fatigue, unspecified: Secondary | ICD-10-CM

## 2017-10-12 DIAGNOSIS — C3411 Malignant neoplasm of upper lobe, right bronchus or lung: Secondary | ICD-10-CM

## 2017-10-12 DIAGNOSIS — Z95828 Presence of other vascular implants and grafts: Secondary | ICD-10-CM

## 2017-10-12 LAB — CBC WITH DIFFERENTIAL (CANCER CENTER ONLY)
Basophils Absolute: 0 10*3/uL (ref 0.0–0.1)
Basophils Relative: 0 %
EOS ABS: 0.1 10*3/uL (ref 0.0–0.5)
EOS PCT: 1 %
HCT: 34.3 % — ABNORMAL LOW (ref 34.8–46.6)
HEMOGLOBIN: 10.9 g/dL — AB (ref 11.6–15.9)
LYMPHS ABS: 1 10*3/uL (ref 0.9–3.3)
Lymphocytes Relative: 15 %
MCH: 26.8 pg (ref 25.1–34.0)
MCHC: 31.8 g/dL (ref 31.5–36.0)
MCV: 84.5 fL (ref 79.5–101.0)
MONO ABS: 0.6 10*3/uL (ref 0.1–0.9)
MONOS PCT: 9 %
Neutro Abs: 5.1 10*3/uL (ref 1.5–6.5)
Neutrophils Relative %: 75 %
PLATELETS: 311 10*3/uL (ref 145–400)
RBC: 4.06 MIL/uL (ref 3.70–5.45)
RDW: 17 % — ABNORMAL HIGH (ref 11.2–14.5)
WBC Count: 6.9 10*3/uL (ref 3.9–10.3)

## 2017-10-12 LAB — CMP (CANCER CENTER ONLY)
ALK PHOS: 86 U/L (ref 40–150)
ALT: 15 U/L (ref 0–55)
AST: 14 U/L (ref 5–34)
Albumin: 3.6 g/dL (ref 3.5–5.0)
Anion gap: 11 (ref 3–11)
BUN: 19 mg/dL (ref 7–26)
CHLORIDE: 107 mmol/L (ref 98–109)
CO2: 24 mmol/L (ref 22–29)
CREATININE: 0.75 mg/dL (ref 0.60–1.10)
Calcium: 9.5 mg/dL (ref 8.4–10.4)
GFR, Est AFR Am: >60 mL/min (ref >60)
GFR, Estimated: >60 mL/min (ref >60)
GLUCOSE: 96 mg/dL (ref 70–140)
Potassium: 4 mmol/L (ref 3.5–5.1)
SODIUM: 142 mmol/L (ref 136–145)
Total Bilirubin: 0.3 mg/dL (ref 0.2–1.2)
Total Protein: 7.1 g/dL (ref 6.4–8.3)

## 2017-10-12 LAB — TSH: TSH: 2.584 u[IU]/mL (ref 0.308–3.960)

## 2017-10-12 MED ORDER — HEPARIN SOD (PORK) LOCK FLUSH 100 UNIT/ML IV SOLN
500.0000 [IU] | Freq: Once | INTRAVENOUS | Status: AC | PRN
  Administered 2017-10-12: 500 [IU]
  Filled 2017-10-12: qty 5

## 2017-10-12 MED ORDER — OXYCODONE-ACETAMINOPHEN 5-325 MG PO TABS: 1.0000 | ORAL_TABLET | Freq: Four times a day (QID) | ORAL | 0 refills | Status: DC | PRN

## 2017-10-12 MED ORDER — SODIUM CHLORIDE 0.9% FLUSH
10.0000 mL | Freq: Once | INTRAVENOUS | Status: AC
  Administered 2017-10-12: 10 mL
  Filled 2017-10-12: qty 10

## 2017-10-12 MED ORDER — SODIUM CHLORIDE 0.9% FLUSH
10.0000 mL | INTRAVENOUS | Status: DC | PRN
  Administered 2017-10-12: 10 mL
  Filled 2017-10-12: qty 10

## 2017-10-12 MED ORDER — SODIUM CHLORIDE 0.9 % IV SOLN
Freq: Once | INTRAVENOUS | Status: AC
  Administered 2017-10-12: 11:00:00 via INTRAVENOUS

## 2017-10-12 MED ORDER — DURVALUMAB 500 MG/10ML IV SOLN
9.2000 mg/kg | Freq: Once | INTRAVENOUS | Status: AC
  Administered 2017-10-12: 620 mg via INTRAVENOUS
  Filled 2017-10-12: qty 10

## 2017-10-12 NOTE — Patient Instructions (Signed)
[place] Discharge Instructions for Patients Receiving Chemotherapy  Today you received the following chemotherapy agents: Durvalumab (Imfinzi).   To help prevent nausea and vomiting after your treatment, we encourage you to take your nausea medication as directed  If you develop nausea and vomiting that is not controlled by your nausea medication, call the clinic.   BELOW ARE SYMPTOMS THAT SHOULD BE REPORTED IMMEDIATELY:  *FEVER GREATER THAN 100.5 F  *CHILLS WITH OR WITHOUT FEVER  NAUSEA AND VOMITING THAT IS NOT CONTROLLED WITH YOUR NAUSEA MEDICATION  *UNUSUAL SHORTNESS OF BREATH  *UNUSUAL BRUISING OR BLEEDING  TENDERNESS IN MOUTH AND THROAT WITH OR WITHOUT PRESENCE OF ULCERS  *URINARY PROBLEMS  *BOWEL PROBLEMS  UNUSUAL RASH Items with * indicate a potential emergency and should be followed up as soon as possible.  Feel free to call the clinic you have any questions or concerns. The clinic phone number is [phone].

## 2017-10-12 NOTE — Progress Notes (Signed)
[place] Telephone:(336) [phone]   Fax:(336) [phone]  OFFICE PROGRESS NOTE  [place], [name], MD [place] [address]  DIAGNOSIS: Stage IIB (T2b, N1, M0) non-small cell lung cancer, adenocarcinoma presented with large left upper lobe lung mass in addition to left hilar lymphadenopathy diagnosed in April 2018. Her previous workup was done at [facility] in [facility]. There was insufficient material to perform the molecular studies.  GUARDANT 360 Molecular studies: BRCA2 N354f. Negative for EGFR, ALK, ROS1, BRAF mutations.  PRIOR THERAPY:  A course of concurrent chemoradiation with weekly carboplatin for AUC of 2 and paclitaxel 45 MG/M2. First dose 01/24/2017. Status post 6 cycles.  CURRENT THERAPY: Consolidation immunotherapy with Imfinzi (Durvalumab) 10 mg/KG every 2 weeks status post 13 cycles.  INTERVAL HISTORY: Felicia Hassan62y.o. female returns to the clinic today for follow-up visit.  The patient continues to tolerate her treatment with Imfinzi (Durvalumab) fairly well.  She denied having any shortness of breath, cough or hemoptysis but she had intermittent right-sided chest pain.  She is requesting refill of Percocet.  She denied having any fever or chills.  She has no nausea, vomiting, diarrhea or constipation.  She is here today for evaluation before starting cycle #14.   MEDICAL HISTORY: Past Medical History:  Diagnosis Date  . Adenocarcinoma of left lung, stage 2 ([facility] 12/27/2016  . Cancer ([facility]    Lun cancer: Right Dx 2008, s/p lobectomy. Left Dx 09/2016  . COPD (chronic obstructive pulmonary disease) ([facility]   . Encounter for antineoplastic chemotherapy 12/27/2016  . History of radiation therapy 01/24/17-03/08/17   left lung 2 Gy in 30 fractions  . Hyperlipidemia   . Hypertension   . Stroke ([facility]     ALLERGIES:  has No Known Allergies.  MEDICATIONS:  Current Outpatient Medications  Medication Sig  Dispense Refill  . aspirin EC 81 MG tablet Take 81 mg by mouth daily.    .Marland Kitchenatorvastatin (LIPITOR) 40 MG tablet Take 1 tablet (40 mg total) by mouth daily. 90 tablet 1  . BREO ELLIPTA 200-25 MCG/INH AEPB INHALE ONE DOSE BY MOUTH DAILY 60 each 0  . Dextromethorphan HBr (COUGH SUPPRESSANT PO) Take by mouth.    . fluticasone (FLONASE) 50 MCG/ACT nasal spray Place 1 spray into both nostrils 2 (two) times daily. 16 g 1  . fluticasone furoate-vilanterol (BREO ELLIPTA) 200-25 MCG/INH AEPB Inhale 1 puff into the lungs daily. 60 each 5  . levothyroxine (SYNTHROID, LEVOTHROID) 75 MCG tablet Take 1 tablet (75 mcg total) by mouth daily before breakfast. 90 tablet 2  . lidocaine-prilocaine (EMLA) cream Apply 1 application topically as needed. 30 g 0  . omeprazole (PRILOSEC) 20 MG capsule Take 1 capsule (20 mg total) by mouth daily. 30 capsule 1  . oxyCODONE-acetaminophen (PERCOCET/ROXICET) 5-325 MG tablet Take 1 tablet by mouth every 6 (six) hours as needed for severe pain. 30 tablet 0  . pregabalin (LYRICA) 50 MG capsule Take 1 capsule (50 mg total) by mouth 2 (two) times daily. 60 capsule 1  . prochlorperazine (COMPAZINE) 10 MG tablet Take 1 tablet (10 mg total) by mouth every 6 (six) hours as needed for nausea or vomiting. 30 tablet 0   No current facility-administered medications for this visit.    Facility-Administered Medications Ordered in Other Visits  Medication Dose Route Frequency Provider Last Rate Last Dose  . sodium chloride flush (NS) 0.9 % injection 10 mL  10 mL Intracatheter PRN  Curt Bears, MD   10 mL at 06/22/17 1747    SURGICAL HISTORY:  Past Surgical History:  Procedure Laterality Date  . BREAST BIOPSY     several. Denies Hx of breast cancer.  . IR FLUORO GUIDE PORT INSERTION RIGHT  02/04/2017  . IR US GUIDE VASC ACCESS RIGHT  02/04/2017  . THORACOTOMY/LOBECTOMY Right 2008  . TONSILLECTOMY  1960    REVIEW OF SYSTEMS:  A comprehensive review of systems was negative except  for: Respiratory: positive for pleurisy/chest pain   PHYSICAL EXAMINATION: General appearance: alert, cooperative and no distress Head: Normocephalic, without obvious abnormality, atraumatic Neck: no adenopathy, no JVD, supple, symmetrical, trachea midline and thyroid not enlarged, symmetric, no tenderness/mass/nodules Lymph nodes: Cervical, supraclavicular, and axillary nodes normal. Resp: clear to auscultation bilaterally Back: symmetric, no curvature. ROM normal. No CVA tenderness. Cardio: regular rate and rhythm, S1, S2 normal, no murmur, click, rub or gallop GI: soft, non-tender; bowel sounds normal; no masses,  no organomegaly Extremities: extremities normal, atraumatic, no cyanosis or edema  ECOG PERFORMANCE STATUS: 1 - Symptomatic but completely ambulatory  Blood pressure 131/79, pulse 79, temperature 97.6 F (36.4 C), temperature source Oral, resp. rate 18, height '5\' 2"'$  (1.575 m), weight 146 lb 8 oz (66.5 kg), SpO2 96 %.  LABORATORY DATA: Lab Results  Component Value Date   WBC 6.0 09/28/2017   HGB 11.4 (L) 09/28/2017   HCT 34.7 (L) 09/28/2017   MCV 82.5 09/28/2017   PLT 311 09/28/2017      Chemistry      Component Value Date/Time   NA 142 09/28/2017 0838   NA 142 06/22/2017 1335   K 3.9 09/28/2017 0838   K 3.9 06/22/2017 1335   CL 107 09/28/2017 0838   CO2 25 09/28/2017 0838   CO2 26 06/22/2017 1335   BUN 18 09/28/2017 0838   BUN 13.6 06/22/2017 1335   CREATININE 0.76 09/28/2017 0838   CREATININE 0.7 06/22/2017 1335      Component Value Date/Time   CALCIUM 9.4 09/28/2017 0838   CALCIUM 9.4 06/22/2017 1335   ALKPHOS 93 09/28/2017 0838   ALKPHOS 93 06/22/2017 1335   AST 14 09/28/2017 0838   AST 12 06/22/2017 1335   ALT 13 09/28/2017 0838   ALT 15 06/22/2017 1335   BILITOT 0.3 09/28/2017 0838   BILITOT 0.25 06/22/2017 1335       RADIOGRAPHIC STUDIES: Ct Chest W Contrast  Result Date: 09/23/2017 CLINICAL DATA:  Followup left lung cancer. Status post  radiation therapy. Immunotherapy. Remote history of right lung cancer. EXAM: CT CHEST WITH CONTRAST TECHNIQUE: Multidetector CT imaging of the chest was performed during intravenous contrast administration. CONTRAST:  75m OMNIPAQUE IOHEXOL 300 MG/ML  SOLN COMPARISON:  CT scan 07/04/2017 FINDINGS: Cardiovascular: The heart is normal in size. No pericardial effusion. Stable tortuosity and calcification of the thoracic aorta. Stable coronary artery calcifications. Mediastinum/Nodes: No mediastinal or hilar mass or lymphadenopathy. Small scattered lymph nodes are stable. Lungs/Pleura: Stable surgical changes from right pneumonectomy. There is a stent in the trachea and right mainstem bronchus. Stable residual fluid and soft tissue debris in the right pleural space. The left lung demonstrates hyperexpansion cross the midline. Ill-defined mass in the left lung measures 23.5 x 12 mm and previously measured 39.5 x 18 mm. Surrounding radiation changes are noted. No new/acute pulmonary findings. New 8 mm nodule at the left lung base on image number 105 could be a metastatic focus. No other pulmonary nodules are demonstrated. Upper Abdomen: No significant  upper abdominal findings. No hepatic or adrenal gland lesions. Musculoskeletal: No breast masses, supraclavicular or axillary lymphadenopathy. No significant bony findings. No evidence of osseous metastatic disease. Slight progressive superior endplate fracture involving an upper thoracic vertebral body. IMPRESSION: 1. Slight further regression of the left lung lesion with surrounding radiation changes. 2. Stable surgical changes from a right pneumonectomy. 3. New 8 mm pulmonary nodule at the left lung base, potential metastatic focus. Short-term followup chest CT suggested in 3 months. 4. No enlarged mediastinal/hilar lymph nodes and no evidence of upper abdominal metastatic disease. 5. Stable emphysematous changes in the left lung. Aortic Atherosclerosis (ICD10-I70.0) and  Emphysema (ICD10-J43.9). Electronically Signed   By: Marijo Sanes M.D.   On: 09/23/2017 13:05    ASSESSMENT AND PLAN: This is a very pleasant 62 years old white female recently diagnosed with unresectable a stage IIB non-small cell lung cancer, adenocarcinoma. The patient underwent a course of concurrent chemoradiation with weekly carboplatin and paclitaxel status post 6 cycles.  She tolerated the treatment well and had partial response. She is currently undergoing consolidation treatment with immunotherapy with Imfinzi (Durvalumab) status post 13 cycles. She tolerated the last cycle of her treatment well with no concerning complaints. I recommended for her to proceed with cycle #14 today as a scheduled. For pain management, I gave her a refill of Percocet today. I will see the patient back for follow-up visit in 2 weeks for evaluation before the next cycle of her treatment. She was advised to call immediately if she has any concerning symptoms in the interval. The patient voices understanding of current disease status and treatment options and is in agreement with the current care plan. All questions were answered. The patient knows to call the clinic with any problems, questions or concerns. We can certainly see the patient much sooner if necessary.  Disclaimer: This note was dictated with voice recognition software. Similar sounding words can inadvertently be transcribed and may not be corrected upon review.

## 2017-10-12 NOTE — Telephone Encounter (Signed)
appts already scheduled per 4/24

## 2017-10-13 ENCOUNTER — Encounter: Payer: Self-pay | Admitting: General Practice

## 2017-10-13 NOTE — Progress Notes (Signed)
[place] Note  [facility] phoned Ms Oppedisano yesterday on my behalf to postpone our appt. Tessa Lerner by phone today, rescheduling for during her next infusion on May 8, which will be a more efficient use of her time.   [place], [place], [facility] Pager [phone] Voicemail [phone]

## 2017-10-18 ENCOUNTER — Ambulatory Visit (INDEPENDENT_AMBULATORY_CARE_PROVIDER_SITE_OTHER): Payer: Medicare Other | Admitting: Family Medicine

## 2017-10-18 ENCOUNTER — Encounter: Payer: Self-pay | Admitting: Family Medicine

## 2017-10-18 ENCOUNTER — Other Ambulatory Visit (HOSPITAL_COMMUNITY)
Admission: RE | Admit: 2017-10-18 | Discharge: 2017-10-18 | Disposition: A | Discharge status: Home / Self Care | Payer: Medicare Other | Source: Ambulatory Visit | Attending: Family Medicine | Admitting: Family Medicine

## 2017-10-18 VITALS — BP 118/84 | HR 74 | Temp 98.8°F | Resp 16 | Ht 62.0 in | Wt 154.4 lb

## 2017-10-18 DIAGNOSIS — E785 Hyperlipidemia, unspecified: Secondary | ICD-10-CM | POA: Insufficient documentation

## 2017-10-18 DIAGNOSIS — Z124 Encounter for screening for malignant neoplasm of cervix: Secondary | ICD-10-CM

## 2017-10-18 DIAGNOSIS — E039 Hypothyroidism, unspecified: Secondary | ICD-10-CM

## 2017-10-18 DIAGNOSIS — Z86018 Personal history of other benign neoplasm: Secondary | ICD-10-CM | POA: Diagnosis not present

## 2017-10-18 DIAGNOSIS — Z780 Asymptomatic menopausal state: Secondary | ICD-10-CM

## 2017-10-18 DIAGNOSIS — Z131 Encounter for screening for diabetes mellitus: Secondary | ICD-10-CM

## 2017-10-18 DIAGNOSIS — J310 Chronic rhinitis: Secondary | ICD-10-CM | POA: Insufficient documentation

## 2017-10-18 DIAGNOSIS — Z1239 Encounter for other screening for malignant neoplasm of breast: Secondary | ICD-10-CM

## 2017-10-18 DIAGNOSIS — Z0000 Encounter for general adult medical examination without abnormal findings: Secondary | ICD-10-CM | POA: Diagnosis not present

## 2017-10-18 DIAGNOSIS — K219 Gastro-esophageal reflux disease without esophagitis: Secondary | ICD-10-CM | POA: Diagnosis not present

## 2017-10-18 DIAGNOSIS — R6889 Other general symptoms and signs: Secondary | ICD-10-CM | POA: Diagnosis not present

## 2017-10-18 LAB — LIPID PANEL
CHOL/HDL RATIO: 2
Cholesterol: 152 mg/dL (ref 0–200)
HDL: 65.2 mg/dL (ref >39.00)
LDL Cholesterol: 67 mg/dL (ref 0–99)
NONHDL: 86.55
Triglycerides: 99 mg/dL (ref 0.0–149.0)
VLDL: 19.8 mg/dL (ref 0.0–40.0)

## 2017-10-18 LAB — HEMOGLOBIN A1C: HEMOGLOBIN A1C: 6 % (ref 4.6–6.5)

## 2017-10-18 MED ORDER — OMEPRAZOLE 20 MG PO CPDR: 20.0000 mg | DELAYED_RELEASE_CAPSULE | ORAL | 1 refills | Status: DC | PRN

## 2017-10-18 MED ORDER — LEVOTHYROXINE SODIUM 75 MCG PO TABS: 75.0000 ug | ORAL_TABLET | Freq: Every day | ORAL | 3 refills | Status: DC

## 2017-10-18 MED ORDER — ZOSTER VAC RECOMB ADJUVANTED 50 MCG/0.5ML IM SUSR: INTRAMUSCULAR | 1 refills | Status: DC

## 2017-10-18 MED ORDER — FLUTICASONE PROPIONATE 50 MCG/ACT NA SUSP: 1.0000 | Freq: Two times a day (BID) | NASAL | 6 refills | Status: DC

## 2017-10-18 MED ORDER — ATORVASTATIN CALCIUM 40 MG PO TABS: 40.0000 mg | ORAL_TABLET | Freq: Every day | ORAL | 3 refills | Status: DC

## 2017-10-18 NOTE — Progress Notes (Addendum)
HPI:   [name] is a 62 y.o. female, who is here today for her routine physical and follow up on some chronic medical problems..  Last CPE: Over a year ago.  Regular exercise 3 or more time per week: Just started going to the gym : [place]. Following a healthy diet: Yes. She lives alone, bother lives close by.   Chronic medical problems: Hypothyroidism, hyperlipidemia, lung cancer, and GERD among some.   Pap smear about 2 to 3 years ago Hx of abnormal pap smears: Over 10 years ago. Hx of STD's: negative  Immunization History  Administered Date(s) Administered  . Influenza,inj,Quad PF,6+ Mos 03/21/2016, 02/22/2017    Mammogram: 2 years ago.  She is reporting history of days breast, has had needle biopsies before, denies any history of breast cancer. Colonoscopy: 04/2016. DEXA: N/A  Hep C screening: She has not had this in the past.  She has no concerns today.  Chronic problems:  Hyperlipidemia: CVA with residual RUE paresis.  Currently on atorvastatin 40 mg daily. Following a low fat diet: Yes.  She has not noted side effects with medication.  Hypothyroidism:  Currently she is on levothyroxine 75 mcg daily. Tolerating medication well, no side effects reported. She has not noted dysphagia, palpitations, abdominal pain, changes in bowel habits, tremor, cold/heat intolerance.  Lab Results  Component Value Date   TSH 2.584 10/12/2017    GERD: Currently she is on omeprazole 20 mg daily as needed. She tries to avoid foods that could exacerbate symptoms.  Allergic rhinitis, symptoms well controlled with Flonase nasal spray.  Review of Systems  Constitutional: Negative for appetite change, fatigue and fever.  HENT: Negative for dental problem, hearing loss, nosebleeds, sore throat, trouble swallowing and voice change.   Eyes: Negative for redness and visual disturbance.  Respiratory: Positive for cough.  Negative for shortness of breath and wheezing.   Cardiovascular: Negative for chest pain and leg swelling.  Gastrointestinal: Negative for abdominal pain, blood in stool, nausea and vomiting.       No changes in bowel habits.  Endocrine: Negative for cold intolerance, heat intolerance, polydipsia, polyphagia and polyuria.  Genitourinary: Negative for decreased urine volume, dysuria, hematuria, vaginal bleeding and vaginal discharge.       No breast tenderness or nipple discharge.  Musculoskeletal: Negative for back pain and myalgias.  Skin: Negative for pallor and rash.  Allergic/Immunologic: Positive for environmental allergies.  Neurological: Negative for syncope, weakness and headaches.  Psychiatric/Behavioral: Negative for confusion and sleep disturbance. The patient is not nervous/anxious.   All other systems reviewed and are negative.     Current Outpatient Medications on File Prior to Visit  Medication Sig Dispense Refill  . aspirin EC 81 MG tablet Take 81 mg by mouth daily.    Marland Kitchen BREO ELLIPTA 200-25 MCG/INH AEPB INHALE ONE DOSE BY MOUTH DAILY 60 each 0  . oxyCODONE-acetaminophen (PERCOCET/ROXICET) 5-325 MG tablet Take 1 tablet by mouth every 6 (six) hours as needed for severe pain. 30 tablet 0  . prochlorperazine (COMPAZINE) 10 MG tablet Take 1 tablet (10 mg total) by mouth every 6 (six) hours as needed for nausea or vomiting. 30 tablet 0   Current Facility-Administered Medications on File Prior to Visit  Medication Dose Route Frequency Provider Last Rate Last Dose  . sodium chloride flush (NS) 0.9 % injection 10 mL  10 mL Intracatheter PRN Curt Bears, MD   10 mL at 06/22/17 1747  Past Medical History:  Diagnosis Date  . Adenocarcinoma of left lung, stage 2 ([place]) 12/27/2016  . Cancer [facility])    Lun cancer: Right Dx 2008, s/p lobectomy. Left Dx 09/2016  . COPD (chronic obstructive pulmonary disease) ([place])   . Encounter for antineoplastic chemotherapy 12/27/2016  . History  of radiation therapy 01/24/17-03/08/17   left lung 2 Gy in 30 fractions  . Hyperlipidemia   . Hypertension   . Stroke [facility])     Past Surgical History:  Procedure Laterality Date  . BREAST BIOPSY     several. Denies Hx of breast cancer.  . IR FLUORO GUIDE PORT INSERTION RIGHT  02/04/2017  . IR US GUIDE VASC ACCESS RIGHT  02/04/2017  . THORACOTOMY/LOBECTOMY Right 2008  . TONSILLECTOMY  1960    No Known Allergies  Family History  Problem Relation Age of Onset  . Arthritis Mother   . Lung cancer Mother 74       d.45 from cancer  . Hypertension Father   . Arthritis Father   . Pancreatic cancer Sister 60       d.60 from cancer  . Brain cancer Brother        d.~70  . Colon cancer Brother        d.~70  . Hypertension Brother   . Mental retardation Brother   . Melanoma Brother 62  . Lung cancer Maternal Aunt 66  . Colon cancer Maternal Grandfather   . Depression Neg Hx   . Heart disease Neg Hx     Social History   Socioeconomic History  . Marital status: Single    Spouse name: Not on file  . Number of children: Not on file  . Years of education: Not on file  . Highest education level: Not on file  Occupational History  . Occupation: Chief Strategy Officer  . Financial resource strain: Not on file  . Food insecurity:    Worry: Not on file    Inability: Not on file  . Transportation needs:    Medical: Not on file    Non-medical: Not on file  Tobacco Use  . Smoking status: Former Smoker    Packs/day: 1.00    Years: 30.00    Pack years: 30.00    Types: Cigarettes    Last attempt to quit: 06/21/2005    Years since quitting: 12.3  . Smokeless tobacco: Never Used  Substance and Sexual Activity  . Alcohol use: No  . Drug use: No  . Sexual activity: Not Currently  Lifestyle  . Physical activity:    Days per week: Not on file    Minutes per session: Not on file  . Stress: Not on file  Relationships  . Social connections:    Talks on phone: Not on file    Gets  together: Not on file    Attends religious service: Not on file    Active member of club or organization: Not on file    Attends meetings of clubs or organizations: Not on file    Relationship status: Not on file  Other Topics Concern  . 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      Vitals:   10/18/17 1057  BP: 118/84  Pulse: 74  Resp: 16  Temp: 98.8 F (37.1 C)  SpO2: 96%   Body mass index is 28.24 kg/m.   Wt Readings from Last 3 Encounters:  10/18/17 154 lb 6 oz (  70 kg)  10/12/17 146 lb 8 oz (66.5 kg)  09/28/17 142 lb 3.2 oz (64.5 kg)    Physical Exam  Nursing note and vitals reviewed. Constitutional: She is oriented to person, place, and time. She appears well-developed. No distress.  HENT:  Head: Normocephalic and atraumatic.  Right Ear: Hearing, tympanic membrane, external ear and ear canal normal.  Left Ear: Hearing, tympanic membrane, external ear and ear canal normal.  Mouth/Throat: Uvula is midline, oropharynx is clear and moist and mucous membranes are normal.  Eyes: Pupils are equal, round, and reactive to light. Conjunctivae and EOM are normal.  Neck: No tracheal deviation present. No thyromegaly present.  Cardiovascular: Normal rate and regular rhythm.  No murmur heard. Pulses:      Dorsalis pedis pulses are 2+ on the right side, and 2+ on the left side.  Respiratory: Effort normal and breath sounds normal. No respiratory distress. No breast swelling or tenderness.  GI: Soft. She exhibits no mass. There is no hepatomegaly. There is no tenderness.  Genitourinary: No breast swelling or tenderness. There is no rash, tenderness or lesion on the right labia. There is no rash, tenderness or lesion on the left labia. Uterus is not enlarged and not tender. Cervix exhibits no motion tenderness and no discharge. Right adnexum displays no mass, no tenderness and no fullness. Left adnexum displays no mass, no tenderness and no fullness. No  erythema or bleeding in the vagina. No vaginal discharge found.  Genitourinary Comments: Breast: No masses, skin abnormalities, or nipple discharge appreciated bilaterally. Vaginal atrophy, she has some discomfort upon pelvic examination. Pap smear collected.  Musculoskeletal: She exhibits no edema.  Limitation of ROM of right shoulder. Muscle atrophy, contractures and limitation of movement of elbow,wrist,and IP joints right hand.   Lymphadenopathy:    She has no cervical adenopathy.       Right: No inguinal adenopathy present.       Left: No inguinal adenopathy present.  Neurological: She is alert and oriented to person, place, and time. No cranial nerve deficit. Gait normal.  Reflex Scores:      Bicep reflexes are 2+ on the right side and 2+ on the left side.      Patellar reflexes are 2+ on the right side and 2+ on the left side. Mildly unstable gait with no assistance.  Skin: Skin is warm. No rash noted. No erythema.  Psychiatric: She has a normal mood and affect. Her speech is normal.  Well groomed, good eye contact.      ASSESSMENT AND PLAN:  Ms. Felicia Herrera was here today annual physical examination and follow-up on chronic problems.  Orders Placed This Encounter  Procedures  . DEXAScan  . Mammogram Digital Screening  . Hemoglobin A1c  . Lipid panel  . Ambulatory referral to Dermatology    Lab Results  Component Value Date   CHOL 152 10/18/2017   HDL 65.20 10/18/2017   LDLCALC 67 10/18/2017   TRIG 99.0 10/18/2017   CHOLHDL 2 10/18/2017    Routine general medical examination at a health care facility  We discussed the importance of regular physical activity and healthy diet for prevention of chronic illness and/or complications. Preventive guidelines reviewed. Vaccination up to date. She states that she cannot get shingles vaccine, Rx given ,so she can discussed with her oncologist.  Ca++ and vit D supplementation recommended. Next CPE in a  year.  Rhinitis, chronic  Stable. No changes in current management. Nasal irrigations with saline. F/U  in 12 months.  -     fluticasone (FLONASE) 50 MCG/ACT nasal spray; Place 1 spray into both nostrils 2 (two) times daily.  Cervical cancer screening -     Cytology - PAP ([place])  Breast cancer screening -     Mammogram Digital Screening; Future  Asymptomatic postmenopausal estrogen deficiency -     DEXAScan; Future  Diabetes mellitus screening -     Hemoglobin A1c  History of atypical skin mole -     Ambulatory referral to Dermatology  Other orders -     Zoster Vaccine Adjuvanted [facility]) injection; 0.5 ml in muscle and repeat in 8 weeks   Hyperlipidemia No changes seen, atorvastatin 40 mg daily. Continue low-fat diet. Further recommendations will be given according to lipid panel results. Follow-up in 6 to 12 months.  GERD (gastroesophageal reflux disease) Problem is stable and well-controlled. Continue Omeprazole 20 mg daily as needed. Follow-up in 12 months, before if needed.  Primary hypothyroidism Recent TSH in normal range. No changes in current management. Follow-up in 12 months.            Return in 1 year (on 10/19/2018).          Cherell Colvin G. [name], MD  [facility]. [place] office.

## 2017-10-18 NOTE — Assessment & Plan Note (Signed)
Problem is stable and well-controlled. Continue Omeprazole 20 mg daily as needed. Follow-up in 12 months, before if needed.

## 2017-10-18 NOTE — Patient Instructions (Signed)
A few things to remember from today's visit:   Routine general medical examination at a health care facility  Primary hypothyroidism - Plan: levothyroxine (SYNTHROID, LEVOTHROID) 75 MCG tablet  Gastroesophageal reflux disease, esophagitis presence not specified - Plan: omeprazole (PRILOSEC) 20 MG capsule  Hyperlipidemia, unspecified hyperlipidemia type - Plan: atorvastatin (LIPITOR) 40 MG tablet, Lipid panel  Rhinitis, chronic - Plan: fluticasone (FLONASE) 50 MCG/ACT nasal spray  Cervical cancer screening - Plan: Cytology - PAP ([place])  Breast cancer screening - Plan: Mammogram Digital Screening  Asymptomatic postmenopausal estrogen deficiency - Plan: DEXAScan  Diabetes mellitus screening - Plan: Hemoglobin A1c  Today you have you routine preventive visit.  At least 150 minutes of moderate exercise per week, daily brisk walking for 15-30 min is a good exercise option. Healthy diet low in saturated (animal) fats and sweets and consisting of fresh fruits and vegetables, lean meats such as fish and white chicken and whole grains.  These are some of recommendations for screening depending of age and risk factors:   - Vaccines:  Tdap vaccine every 10 years.  Shingles vaccine recommended at age 69, could be given after 61 years of age but not sure about insurance coverage.   Pneumonia vaccines:  Prevnar 13 at 65 and Pneumovax at 85. Sometimes Pneumovax is giving earlier if history of smoking, lung disease,diabetes,kidney disease among some.    Screening for diabetes at age 31 and every 3 years.  Cervical cancer prevention:  Pap smear starts at 61 years of age and continues periodically until 61 years old in low risk women. Pap smear every 3 years between 57 and 45 years old. Pap smear every 3-5 years between women 67 and older if pap smear negative and HPV screening negative.   -Breast cancer: Mammogram: There is disagreement between experts about when to start screening  in low risk asymptomatic female but recent recommendations are to start screening at 52 and not later than 61 years old , every 1-2 years and after 61 yo q 2 years. Screening is recommended until 61 years old but some women can continue screening depending of healthy issues.   Colon cancer screening: starts at 61 years old until 61 years old.    Also recommended:  1. Dental visit- Brush and floss your teeth twice daily; visit your dentist twice a year. 2. Eye doctor- Get an eye exam at least every 2 years. 3. Helmet use- Always wear a helmet when riding a bicycle, motorcycle, rollerblading or skateboarding. 4. Safe sex- If you may be exposed to sexually transmitted infections, use a condom. 5. Seat belts- Seat belts can save your live; always wear one. 6. Smoke/Carbon Monoxide detectors- These detectors need to be installed on the appropriate level of your home. Replace batteries at least once a year. 7. Skin cancer- When out in the sun please cover up and use sunscreen 15 SPF or higher. 8. Violence- If anyone is threatening or hurting you, please tell your healthcare provider.  9. Drink alcohol in moderation- Limit alcohol intake to one drink or less per day. Never drink and drive.  Please be sure medication list is accurate. If a new problem present, please set up appointment sooner than planned today.

## 2017-10-18 NOTE — Assessment & Plan Note (Signed)
No changes seen, atorvastatin 40 mg daily. Continue low-fat diet. Further recommendations will be given according to lipid panel results. Follow-up in 6 to 12 months.

## 2017-10-18 NOTE — Assessment & Plan Note (Signed)
Recent TSH in normal range. No changes in current management. Follow-up in 12 months.

## 2017-10-20 LAB — CYTOLOGY - PAP
Diagnosis: NEGATIVE
HPV (WINDOPATH): NOT DETECTED

## 2017-10-21 ENCOUNTER — Other Ambulatory Visit: Payer: Self-pay | Admitting: *Deleted

## 2017-10-21 ENCOUNTER — Telehealth: Payer: Self-pay | Admitting: Family Medicine

## 2017-10-21 DIAGNOSIS — K219 Gastro-esophageal reflux disease without esophagitis: Secondary | ICD-10-CM

## 2017-10-21 MED ORDER — OMEPRAZOLE 20 MG PO CPDR: 20.0000 mg | DELAYED_RELEASE_CAPSULE | Freq: Every day | ORAL | 3 refills | Status: DC

## 2017-10-21 NOTE — Telephone Encounter (Signed)
Left message for pt to contact pulmonologist for refill of medication per notes on current prescription.

## 2017-10-21 NOTE — Telephone Encounter (Signed)
Copied from [place] [id number]. Topic: Quick Communication - See Telephone Encounter >> Oct 21, 2017 12:57 PM Hewitt Shorts wrote: Pt is needing Dr. [name] to call this number at [facility] [phone] for breo ellipta   Best number [phone]

## 2017-10-23 ENCOUNTER — Encounter: Payer: Self-pay | Admitting: Family Medicine

## 2017-10-24 ENCOUNTER — Other Ambulatory Visit: Payer: Self-pay | Admitting: *Deleted

## 2017-10-24 ENCOUNTER — Telehealth: Payer: Self-pay | Admitting: Internal Medicine

## 2017-10-24 DIAGNOSIS — Z8709 Personal history of other diseases of the respiratory system: Secondary | ICD-10-CM

## 2017-10-24 MED ORDER — FLUTICASONE FUROATE-VILANTEROL 200-25 MCG/INH IN AEPB: 1.0000 | INHALATION_SPRAY | Freq: Every day | RESPIRATORY_TRACT | 0 refills | Status: DC

## 2017-10-24 NOTE — Telephone Encounter (Signed)
This has been sent in. Patient is aware.

## 2017-10-26 ENCOUNTER — Inpatient Hospital Stay: Payer: Medicare Other

## 2017-10-26 ENCOUNTER — Inpatient Hospital Stay: Payer: Medicare Other | Attending: Nurse Practitioner

## 2017-10-26 ENCOUNTER — Encounter: Payer: Self-pay | Admitting: Internal Medicine

## 2017-10-26 ENCOUNTER — Encounter: Payer: Self-pay | Admitting: General Practice

## 2017-10-26 ENCOUNTER — Inpatient Hospital Stay (HOSPITAL_BASED_OUTPATIENT_CLINIC_OR_DEPARTMENT_OTHER): Payer: Medicare Other | Admitting: Internal Medicine

## 2017-10-26 ENCOUNTER — Telehealth: Payer: Self-pay

## 2017-10-26 VITALS — BP 122/66 | HR 77 | Temp 97.7°F | Resp 18 | Ht 62.0 in | Wt 144.7 lb

## 2017-10-26 DIAGNOSIS — R590 Localized enlarged lymph nodes: Secondary | ICD-10-CM | POA: Insufficient documentation

## 2017-10-26 DIAGNOSIS — J449 Chronic obstructive pulmonary disease, unspecified: Secondary | ICD-10-CM | POA: Diagnosis not present

## 2017-10-26 DIAGNOSIS — Z95828 Presence of other vascular implants and grafts: Secondary | ICD-10-CM

## 2017-10-26 DIAGNOSIS — E785 Hyperlipidemia, unspecified: Secondary | ICD-10-CM

## 2017-10-26 DIAGNOSIS — Z9221 Personal history of antineoplastic chemotherapy: Secondary | ICD-10-CM | POA: Diagnosis not present

## 2017-10-26 DIAGNOSIS — C3492 Malignant neoplasm of unspecified part of left bronchus or lung: Secondary | ICD-10-CM

## 2017-10-26 DIAGNOSIS — Z5112 Encounter for antineoplastic immunotherapy: Secondary | ICD-10-CM | POA: Diagnosis not present

## 2017-10-26 DIAGNOSIS — I700 Atherosclerosis of aorta: Secondary | ICD-10-CM | POA: Diagnosis not present

## 2017-10-26 DIAGNOSIS — Z8673 Personal history of transient ischemic attack (TIA), and cerebral infarction without residual deficits: Secondary | ICD-10-CM

## 2017-10-26 DIAGNOSIS — Z7982 Long term (current) use of aspirin: Secondary | ICD-10-CM | POA: Diagnosis not present

## 2017-10-26 DIAGNOSIS — C3411 Malignant neoplasm of upper lobe, right bronchus or lung: Secondary | ICD-10-CM

## 2017-10-26 DIAGNOSIS — I10 Essential (primary) hypertension: Secondary | ICD-10-CM

## 2017-10-26 DIAGNOSIS — C3412 Malignant neoplasm of upper lobe, left bronchus or lung: Secondary | ICD-10-CM | POA: Diagnosis not present

## 2017-10-26 DIAGNOSIS — R5382 Chronic fatigue, unspecified: Secondary | ICD-10-CM | POA: Diagnosis not present

## 2017-10-26 DIAGNOSIS — Z923 Personal history of irradiation: Secondary | ICD-10-CM | POA: Diagnosis not present

## 2017-10-26 DIAGNOSIS — Z79899 Other long term (current) drug therapy: Secondary | ICD-10-CM

## 2017-10-26 LAB — CMP (CANCER CENTER ONLY)
ALBUMIN: 3.6 g/dL (ref 3.5–5.0)
ALT: 12 U/L (ref 0–55)
ANION GAP: 7 (ref 3–11)
AST: 15 U/L (ref 5–34)
Alkaline Phosphatase: 98 U/L (ref 40–150)
BILIRUBIN TOTAL: 0.3 mg/dL (ref 0.2–1.2)
BUN: 15 mg/dL (ref 7–26)
CO2: 29 mmol/L (ref 22–29)
Calcium: 9.3 mg/dL (ref 8.4–10.4)
Chloride: 106 mmol/L (ref 98–109)
Creatinine: 0.77 mg/dL (ref 0.60–1.10)
GFR, Est AFR Am: >60 mL/min (ref >60)
GLUCOSE: 87 mg/dL (ref 70–140)
POTASSIUM: 3.5 mmol/L (ref 3.5–5.1)
Sodium: 142 mmol/L (ref 136–145)
TOTAL PROTEIN: 7.1 g/dL (ref 6.4–8.3)

## 2017-10-26 LAB — CBC WITH DIFFERENTIAL (CANCER CENTER ONLY)
BASOS ABS: 0.1 10*3/uL (ref 0.0–0.1)
Basophils Relative: 1 %
Eosinophils Absolute: 0.1 10*3/uL (ref 0.0–0.5)
Eosinophils Relative: 2 %
HEMATOCRIT: 33.4 % — AB (ref 34.8–46.6)
HEMOGLOBIN: 10.9 g/dL — AB (ref 11.6–15.9)
LYMPHS PCT: 19 %
Lymphs Abs: 0.8 10*3/uL — ABNORMAL LOW (ref 0.9–3.3)
MCH: 27.3 pg (ref 25.1–34.0)
MCHC: 32.6 g/dL (ref 31.5–36.0)
MCV: 83.7 fL (ref 79.5–101.0)
Monocytes Absolute: 0.4 10*3/uL (ref 0.1–0.9)
Monocytes Relative: 10 %
NEUTROS ABS: 3 10*3/uL (ref 1.5–6.5)
Neutrophils Relative %: 68 %
Platelet Count: 295 10*3/uL (ref 145–400)
RBC: 3.99 MIL/uL (ref 3.70–5.45)
RDW: 16.9 % — ABNORMAL HIGH (ref 11.2–14.5)
WBC: 4.4 10*3/uL (ref 3.9–10.3)

## 2017-10-26 MED ORDER — SODIUM CHLORIDE 0.9 % IV SOLN
Freq: Once | INTRAVENOUS | Status: AC
  Administered 2017-10-26: 11:00:00 via INTRAVENOUS

## 2017-10-26 MED ORDER — SODIUM CHLORIDE 0.9 % IV SOLN
620.0000 mg | Freq: Once | INTRAVENOUS | Status: AC
  Administered 2017-10-26: 620 mg via INTRAVENOUS
  Filled 2017-10-26: qty 10

## 2017-10-26 MED ORDER — SODIUM CHLORIDE 0.9% FLUSH
10.0000 mL | Freq: Once | INTRAVENOUS | Status: AC
  Administered 2017-10-26: 10 mL
  Filled 2017-10-26: qty 10

## 2017-10-26 MED ORDER — SODIUM CHLORIDE 0.9% FLUSH
10.0000 mL | INTRAVENOUS | Status: DC | PRN
  Administered 2017-10-26: 10 mL
  Filled 2017-10-26: qty 10

## 2017-10-26 MED ORDER — HEPARIN SOD (PORK) LOCK FLUSH 100 UNIT/ML IV SOLN
500.0000 [IU] | Freq: Once | INTRAVENOUS | Status: AC | PRN
  Administered 2017-10-26: 500 [IU]
  Filled 2017-10-26: qty 5

## 2017-10-26 NOTE — Patient Instructions (Signed)
[facility] Discharge Instructions for Patients Receiving Chemotherapy  Today you received the following chemotherapy agents: Imfinzi.  To help prevent nausea and vomiting after your treatment, we encourage you to take your nausea medication as directed.   If you develop nausea and vomiting that is not controlled by your nausea medication, call the clinic.   BELOW ARE SYMPTOMS THAT SHOULD BE REPORTED IMMEDIATELY:  *FEVER GREATER THAN 100.5 F  *CHILLS WITH OR WITHOUT FEVER  NAUSEA AND VOMITING THAT IS NOT CONTROLLED WITH YOUR NAUSEA MEDICATION  *UNUSUAL SHORTNESS OF BREATH  *UNUSUAL BRUISING OR BLEEDING  TENDERNESS IN MOUTH AND THROAT WITH OR WITHOUT PRESENCE OF ULCERS  *URINARY PROBLEMS  *BOWEL PROBLEMS  UNUSUAL RASH Items with * indicate a potential emergency and should be followed up as soon as possible.  Feel free to call the clinic should you have any questions or concerns. The clinic phone number is [phone].  Please show the CHEMO ALERT CARD at check-in to the Emergency Department and triage nurse.   

## 2017-10-26 NOTE — Progress Notes (Signed)
[place] Spiritual Care Note  Followed up with Malachy Mood in infusion today for what became a >90 conversation about personal growth, healing, and challenges of family dynamics related to and predating ca dx. She desires local counseling referral for continued support, and Spiritual Care can be an adjunct/addition to that work.  Provided empathic listening, emotional support, normalization of feelings, affirmation of strengths, and questions for reflection and discernment. We plan to f/u with further appts, and I plan to phone with counseling referral suggestions per her request.   Chaplain Shelva Majestic, [facility] Pager [phone] Voicemail [phone]

## 2017-10-26 NOTE — Patient Instructions (Signed)

## 2017-10-26 NOTE — Progress Notes (Signed)
[place] [phone]   [phone]  OFFICE PROGRESS NOTE  Herrera, [name], MD [place] [address]  DIAGNOSIS: Stage IIB (T2b, N1, M0) non-small cell lung cancer, adenocarcinoma presented with large left upper lobe lung mass in addition to left hilar lymphadenopathy diagnosed in April 2018. Her previous workup was done at [facility] in [facility]. There was insufficient material to perform the molecular studies.  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 Negative for EGFR, ALK, ROS1, BRAF mutations.  PRIOR THERAPY:  A course of concurrent chemoradiation with weekly carboplatin for AUC of 2 and paclitaxel 45 MG/M2. First dose 01/24/2017. Status post 6 cycles.  CURRENT THERAPY: Consolidation immunotherapy with Imfinzi (Durvalumab) 10 mg/KG every 2 weeks status post 14 cycles.  INTERVAL HISTORY: CChelsey Redondo62y.o. female returns to the clinic today for follow-up visit.  The patient is feeling fine today with no concerning complaints.  She denied having any chest pain, shortness of breath, cough or hemoptysis.  She exercises at regular basis.  She denied having any weight loss or night sweats.  She has no fever or chills.  She continues to tolerate her treatment with Imfinzi (Durvalumab) fairly well.  She is here for evaluation before starting cycle #15.   MEDICAL HISTORY: Past Medical History:  Diagnosis Date  . [place]  . Cancer ([facility]    Lun cancer: Right Dx 2008, s/p lobectomy. Left Dx 09/2016  . [place]   . Encounter for antineoplastic chemotherapy 12/27/2016  . History of radiation therapy 01/24/17-03/08/17   left lung 2 Gy in 30 fractions  . Hyperlipidemia   . Hypertension   . Stroke ([facility]     ALLERGIES:  has No Known Allergies.  MEDICATIONS:  Current Outpatient Medications  Medication Sig  Dispense Refill  . aspirin EC 81 MG tablet Take 81 mg by mouth daily.    .Marland Kitchenatorvastatin (LIPITOR) 40 MG tablet Take 1 tablet (40 mg total) by mouth daily. 90 tablet 3  . fluticasone (FLONASE) 50 MCG/ACT nasal spray Place 1 spray into both nostrils 2 (two) times daily. 16 g 6  . fluticasone furoate-vilanterol (BREO ELLIPTA) 200-25 MCG/INH AEPB Inhale 1 puff into the lungs daily. 60 each 0  . levothyroxine (SYNTHROID, LEVOTHROID) 75 MCG tablet Take 1 tablet (75 mcg total) by mouth daily before breakfast. 90 tablet 3  . omeprazole (PRILOSEC) 20 MG capsule Take 1 capsule (20 mg total) by mouth daily. 90 capsule 3  . oxyCODONE-acetaminophen (PERCOCET/ROXICET) 5-325 MG tablet Take 1 tablet by mouth every 6 (six) hours as needed for severe pain. 30 tablet 0  . prochlorperazine (COMPAZINE) 10 MG tablet Take 1 tablet (10 mg total) by mouth every 6 (six) hours as needed for nausea or vomiting. 30 tablet 0  . Zoster Vaccine Adjuvanted ([facility] injection 0.5 ml in muscle and repeat in 8 weeks 0.5 mL 1   No current facility-administered medications for this visit.    Facility-Administered Medications Ordered in Other Visits  Medication Dose Route Frequency Provider Last Rate Last Dose  . sodium chloride flush (NS) 0.9 % injection 10 mL  10 mL Intracatheter PRN MCurt Bears MD   10 mL at 06/22/17 1747    SURGICAL HISTORY:  Past Surgical History:  Procedure Laterality Date  . BREAST BIOPSY     several. Denies Hx of breast cancer.  . IR FLUORO GUIDE  PORT INSERTION RIGHT  02/04/2017  . IR US GUIDE VASC ACCESS RIGHT  02/04/2017  . THORACOTOMY/LOBECTOMY Right 2008  . TONSILLECTOMY  1960    REVIEW OF SYSTEMS:  A comprehensive review of systems was negative.   PHYSICAL EXAMINATION: General appearance: alert, cooperative and no distress Head: Normocephalic, without obvious abnormality, atraumatic Neck: no adenopathy, no JVD, supple, symmetrical, trachea midline and thyroid not enlarged, symmetric, no  tenderness/mass/nodules Lymph nodes: Cervical, supraclavicular, and axillary nodes normal. Resp: clear to auscultation bilaterally Back: symmetric, no curvature. ROM normal. No CVA tenderness. Cardio: regular rate and rhythm, S1, S2 normal, no murmur, click, rub or gallop GI: soft, non-tender; bowel sounds normal; no masses,  no organomegaly Extremities: extremities normal, atraumatic, no cyanosis or edema  ECOG PERFORMANCE STATUS: 1 - Symptomatic but completely ambulatory  Blood pressure 122/66, pulse 77, temperature 97.7 F (36.5 C), temperature source Oral, resp. rate 18, height '5\' 2"'$  (1.575 m), weight 144 lb 11.2 oz (65.6 kg), SpO2 97 %.  LABORATORY DATA: Lab Results  Component Value Date   WBC 4.4 10/26/2017   HGB 10.9 (L) 10/26/2017   HCT 33.4 (L) 10/26/2017   MCV 83.7 10/26/2017   PLT 295 10/26/2017      Chemistry      Component Value Date/Time   NA 142 10/26/2017 0900   NA 142 06/22/2017 1335   K 3.5 10/26/2017 0900   K 3.9 06/22/2017 1335   CL 106 10/26/2017 0900   CO2 29 10/26/2017 0900   CO2 26 06/22/2017 1335   BUN 15 10/26/2017 0900   BUN 13.6 06/22/2017 1335   CREATININE 0.77 10/26/2017 0900   CREATININE 0.7 06/22/2017 1335      Component Value Date/Time   CALCIUM 9.3 10/26/2017 0900   CALCIUM 9.4 06/22/2017 1335   ALKPHOS 98 10/26/2017 0900   ALKPHOS 93 06/22/2017 1335   AST 15 10/26/2017 0900   AST 12 06/22/2017 1335   ALT 12 10/26/2017 0900   ALT 15 06/22/2017 1335   BILITOT 0.3 10/26/2017 0900   BILITOT 0.25 06/22/2017 1335       RADIOGRAPHIC STUDIES: No results found.  ASSESSMENT AND PLAN: This is a very pleasant 62 years old white female recently diagnosed with unresectable a stage IIB non-small cell lung cancer, adenocarcinoma. The patient underwent a course of concurrent chemoradiation with weekly carboplatin and paclitaxel status post 6 cycles.  She tolerated the treatment well and had partial response. She is currently undergoing  consolidation treatment with immunotherapy with Imfinzi (Durvalumab) status post 14 cycles. The patient continues to tolerate this treatment well.  I recommended for her to proceed with cycle #15 today as a scheduled. She will come back for follow-up visit in 2 weeks for evaluation before cycle #16. She was advised to call if she has any concerning symptoms in the interval. The patient voices understanding of current disease status and treatment options and is in agreement with the current care plan. All questions were answered. The patient knows to call the clinic with any problems, questions or concerns. We can certainly see the patient much sooner if necessary.  Disclaimer: This note was dictated with voice recognition software. Similar sounding words can inadvertently be transcribed and may not be corrected upon review.

## 2017-10-26 NOTE — Telephone Encounter (Signed)
Printed avs and calender of upcoming appointment per 5/8 los

## 2017-10-27 DIAGNOSIS — R6889 Other general symptoms and signs: Secondary | ICD-10-CM | POA: Diagnosis not present

## 2017-11-02 ENCOUNTER — Telehealth: Payer: Self-pay | Admitting: Internal Medicine

## 2017-11-02 DIAGNOSIS — Z8709 Personal history of other diseases of the respiratory system: Secondary | ICD-10-CM

## 2017-11-02 DIAGNOSIS — D1801 Hemangioma of skin and subcutaneous tissue: Secondary | ICD-10-CM | POA: Diagnosis not present

## 2017-11-02 DIAGNOSIS — L570 Actinic keratosis: Secondary | ICD-10-CM | POA: Diagnosis not present

## 2017-11-02 DIAGNOSIS — L821 Other seborrheic keratosis: Secondary | ICD-10-CM | POA: Diagnosis not present

## 2017-11-02 DIAGNOSIS — R6889 Other general symptoms and signs: Secondary | ICD-10-CM | POA: Diagnosis not present

## 2017-11-02 DIAGNOSIS — D485 Neoplasm of uncertain behavior of skin: Secondary | ICD-10-CM | POA: Diagnosis not present

## 2017-11-02 NOTE — Telephone Encounter (Signed)
Called and spoke with pt stating to her we needed to schedule an OV for her before we could send a script of the Breo to her pharmacy for her.  Pt expressed understanding. OV scheduled for pt with MR 5/20 at 11:00.  Nothing further needed at this time.

## 2017-11-02 NOTE — Telephone Encounter (Signed)
Pt needs to have a f/u appt and then we can take care of her breo script for her.  Pt last seen by MR 02/22/17 and was to f/u 6 months from that visit. Pt currently does not have a f/u appt scheduled at our office.  Attempted to calll pt to see if I could schedule an appt for her but no answer.  Left message for pt to return call.

## 2017-11-02 NOTE — Telephone Encounter (Signed)
Patient is returning call. CB is [phone]

## 2017-11-07 ENCOUNTER — Ambulatory Visit (INDEPENDENT_AMBULATORY_CARE_PROVIDER_SITE_OTHER): Payer: Medicare Other | Admitting: Internal Medicine

## 2017-11-07 ENCOUNTER — Encounter: Payer: Self-pay | Admitting: Internal Medicine

## 2017-11-07 VITALS — BP 124/82 | HR 88 | Ht 62.0 in | Wt 147.6 lb

## 2017-11-07 DIAGNOSIS — J449 Chronic obstructive pulmonary disease, unspecified: Secondary | ICD-10-CM

## 2017-11-07 DIAGNOSIS — Z8709 Personal history of other diseases of the respiratory system: Secondary | ICD-10-CM | POA: Diagnosis not present

## 2017-11-07 DIAGNOSIS — Z902 Acquired absence of lung [part of]: Secondary | ICD-10-CM | POA: Diagnosis not present

## 2017-11-07 MED ORDER — FLUTICASONE FUROATE-VILANTEROL 200-25 MCG/INH IN AEPB: 1.0000 | INHALATION_SPRAY | Freq: Every day | RESPIRATORY_TRACT | 1 refills | Status: DC

## 2017-11-07 NOTE — Patient Instructions (Addendum)
ICD-10-CM   1. Stage 2 moderate COPD by GOLD classification ([place]) J44.9   2. COPD Z87.09 fluticasone furoate-vilanterol (BREO ELLIPTA) 200-25 MCG/INH AEPB    Stable disaes Minimal baseline symptoms No evidence of PFO and right to left shunt due to right pneumonectomy  Plan Continue breoo 200/25 daily Albuterol as needed Flu shot in fall  Followu[ 9 months or sooner if needed; cAT score at followup  - if continuing to do well, then reduce breo dose at followup

## 2017-11-07 NOTE — Progress Notes (Signed)
Subjective:     Patient ID: Felicia Herrera, female   DOB: 1956/06/21, 62 y.o.   MRN: [id number]  HPI PCP Herrera, [name], MD   HPI  IOV 12/09/2016  Chief Complaint  Patient presents with  . Pulmonary Consult    Pt referred by Felicia Herrera for malignant neoplasm of left lung. Pt denies SOB and CP/tightness.  Pt c/o dry cough.    Felicia Herrera 29-Nov-1955 [address] [place] [address] - 62 year old female referred by Dr. [name] for evaluation of COPD and lung cancer  At age 6 she moved from [place] where she was born to [facility] where she lived all her life. She used to smoke heavily in the past quit 11 years ago. 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 This happened in the aftermath of right lobectomy/pneumonectomy according to her history for lung cancer. After the lung cancer was in complete remission and she was leading a functional life. Then in 2018 under surveillance she's had a recurrence of lung cancer in the left lung in the area behind her left breast. Apparently this is been biopsy confirmed at [facility] in [facility]. She recollects having had CT scan of the chest and the PET scan back in the spring 2018. Apparently she i' need of chemotherapy and radiation therapy but because she was living alone in [place] she was asked to relocate to [facility] by her brother lives. She currently lives by herself and is awaiting a roommate. She uses a cane to get around. But she is otherwise functional and does not have any dysphagia or cough or dyspnea.  She denies a diagnosis of COPD although he does mention in the chart and she does admit that her Advair helps with seems somewhat contradictory. She has smoked heavily in the past      OV 02/22/2017  Chief Complaint  Patient presents with  . Follow-up    Pt has 2 weeks of chemo left than  done. Pt has daily productive cough with yellow mucus.Pt is requesting flu shot wanting to know if thats okay with chemo.     FU COPD  She has lung cancer and COPD. Recently relocated from [place]. After I saw her she was establishing oncology and is going through carboplatin and Taxol chemotherapy. She states she is stable. She is on Spiriva for her COPD. She had pulmonary function test July 2008 shows Gold stage II COPD. Because of insurance reasons a primary care switching her to [facility] which is going to start. Overall she's stable. Mild dyspnea no active issues. She wants to have flu shot.    OV 11/07/2017  Chief Complaint  Patient presents with  . Follow-up    Pt states she has been doing well since last visit. Denies any complaints or concerns.    Advanced lung cancer on immunotherapy.  Has moderate COPD.  She is status post pneumonectomy.  This is routine follow-up.  Her COPD status is stable.  She feels excellent.  COPD CAT score is 10.  She really did not make this appointment but because she ran out of Breo she made the appointment to get a refill.  She has no complaints.  She is getting her immunotherapy infusions on a scheduled basis with Dr. Julien Herrera.  Visualization of the CT scan of the chest done in April 2019 show status post pneumonectomy and  with complete rotation of the heart to the ipsilateral side of the pneumonectomy.  She is at risk for right-to-left shunting with patent foramen ovale opening but she denies any dizziness or orthopnea proximal nocturnal dyspnea or platypnea or orthodeoxia.  We tested her saturation in different positions and they were all normal.    CAT COPD Symptom & Quality of Life Score ([place]) 0 is no burden. 5 is highest burden 11/07/2017   Never Cough -> Cough all the time 2  No phlegm in chest -> Chest is full of phlegm 2  No chest tightness -> Chest feels very tight 1  No dyspnea for 1 flight stairs/hill -> Very dyspneic for 1 flight  of stairs 2  No limitations for ADL at home -> Very limited with ADL at home 1  Confident leaving home -> Not at all confident leaving home 1  Sleep soundly -> Do not sleep soundly because of lung condition 0  Lots of Energy -> No energy at all 1  TOTAL Score (max 40)  10      Position - room air pulse ox Pulse ox HR  supine 94% 87  Left side down 95% 89  Right side down 95% 85  Standing 97% 104         Results for Felicia Herrera (MRN [id number]) as of 02/22/2017 10:50  Ref. Range 01/17/2017 09:51  FEV1-Post Latest Units: L 1.25  FEV1-%Pred-Post Latest Units: % 52  FEV1-%Change-Post Latest Units: % 15   Results for Felicia Herrera (MRN [id number]) as of 02/22/2017 10:50  Ref. Range 01/17/2017 09:51  DLCO unc Latest Units: ml/min/mmHg 10.43  DLCO unc % pred Latest Units: % 48   IMPRESSION: 1. Slight further regression of the left lung lesion with surrounding radiation changes. 2. Stable surgical changes from a right pneumonectomy. 3. New 8 mm pulmonary nodule at the left lung base, potential metastatic focus. Short-term followup chest CT suggested in 3 months. 4. No enlarged mediastinal/hilar lymph nodes and no evidence of upper abdominal metastatic disease. 5. Stable emphysematous changes in the left lung.  Aortic Atherosclerosis (ICD10-I70.0) and Emphysema (ICD10-J43.9).   Electronically Signed   By: Marijo Sanes M.D.   On: 09/23/2017 13:05     has a past medical history of Adenocarcinoma of left lung, stage 2 ([place]) (12/27/2016), Cancer ([place]), COPD (chronic obstructive pulmonary disease) ([place]), Encounter for antineoplastic chemotherapy (12/27/2016), History of radiation therapy (01/24/17-03/08/17), Hyperlipidemia, Hypertension, and Stroke ([place]).   reports that she quit smoking about 12 years ago. Her smoking use included cigarettes. She has a 30.00 pack-year smoking history. She has never used smokeless tobacco.  Past Surgical History:  Procedure Laterality  Date  . BREAST BIOPSY     several. Denies Hx of breast cancer.  . IR FLUORO GUIDE PORT INSERTION RIGHT  02/04/2017  . IR US GUIDE VASC ACCESS RIGHT  02/04/2017  . THORACOTOMY/LOBECTOMY Right 2008  . TONSILLECTOMY  1960    No Known Allergies  Immunization History  Administered Date(s) Administered  . Influenza,inj,Quad PF,6+ Mos 03/21/2016, 02/22/2017    Family History  Problem Relation Age of Onset  . Arthritis Mother   . Lung cancer Mother 13       d.45 from cancer  . Hypertension Father   . Arthritis Father   . Pancreatic cancer Sister 60       d.60 from cancer  . Brain cancer Brother        d.~70  . Colon cancer Brother  d.~70  . Hypertension Brother   . Mental retardation Brother   . Melanoma Brother 27  . Lung cancer Maternal Aunt 66  . Colon cancer Maternal Grandfather   . Depression Neg Hx   . Heart disease Neg Hx      Current Outpatient Medications:  .  aspirin EC 81 MG tablet, Take 81 mg by mouth daily., Disp: , Rfl:  .  atorvastatin (LIPITOR) 40 MG tablet, Take 1 tablet (40 mg total) by mouth daily., Disp: 90 tablet, Rfl: 3 .  fluticasone (FLONASE) 50 MCG/ACT nasal spray, Place 1 spray into both nostrils 2 (two) times daily., Disp: 16 Herrera, Rfl: 6 .  fluticasone furoate-vilanterol (BREO ELLIPTA) 200-25 MCG/INH AEPB, Inhale 1 puff into the lungs daily., Disp: 90 each, Rfl: 1 .  levothyroxine (SYNTHROID, LEVOTHROID) 75 MCG tablet, Take 1 tablet (75 mcg total) by mouth daily before breakfast., Disp: 90 tablet, Rfl: 3 .  omeprazole (PRILOSEC) 20 MG capsule, Take 1 capsule (20 mg total) by mouth daily., Disp: 90 capsule, Rfl: 3 .  oxyCODONE-acetaminophen (PERCOCET/ROXICET) 5-325 MG tablet, Take 1 tablet by mouth every 6 (six) hours as needed for severe pain. (Patient not taking: Reported on 11/07/2017), Disp: 30 tablet, Rfl: 0 .  prochlorperazine (COMPAZINE) 10 MG tablet, Take 1 tablet (10 mg total) by mouth every 6 (six) hours as needed for nausea or vomiting.  (Patient not taking: Reported on 11/07/2017), Disp: 30 tablet, Rfl: 0 No current facility-administered medications for this visit.   Facility-Administered Medications Ordered in Other Visits:  .  sodium chloride flush (NS) 0.9 % injection 10 mL, 10 mL, Intracatheter, PRN, Curt Bears, MD, 10 mL at 06/22/17 1747  Review of Systems     Objective:   Physical Exam  Constitutional: She is oriented to person, place, and time. She appears well-developed and well-nourished. No distress.  HENT:  Head: Normocephalic and atraumatic.  Right Ear: External ear normal.  Left Ear: External ear normal.  Mouth/Throat: Oropharynx is clear and moist. No oropharyngeal exudate.  Eyes: Pupils are equal, round, and reactive to light. Conjunctivae and EOM are normal. Right eye exhibits no discharge. Left eye exhibits no discharge. No scleral icterus.  Neck: Normal range of motion. Neck supple. No JVD present. No tracheal deviation present. No thyromegaly present.  Cardiovascular: Normal rate, regular rhythm, normal heart sounds and intact distal pulses. Exam reveals no gallop and no friction rub.  No murmur heard. Pulmonary/Chest: Effort normal and breath sounds normal. No respiratory distress. She has no wheezes. She has no rales. She exhibits no tenderness.  Abdominal: Soft. Bowel sounds are normal. She exhibits no distension and no mass. There is no tenderness. There is no rebound and no guarding.  Musculoskeletal: Normal range of motion. She exhibits no edema or tenderness.  Right shoulder droop  Lymphadenopathy:    She has no cervical adenopathy.  Neurological: She is alert and oriented to person, place, and time. She has normal reflexes. No cranial nerve deficit. She exhibits normal muscle tone. Coordination normal.  Skin: Skin is warm and dry. No rash noted. She is not diaphoretic. No erythema. No pallor.  Psychiatric: She has a normal mood and affect. Her behavior is normal. Judgment and thought  content normal.  Vitals reviewed.  Vitals:   11/07/17 1039  BP: 124/82  Pulse: 88  SpO2: 98%  Weight: 147 lb 9.6 oz (67 kg)  Height: 5\' 2"  (1.575 m)    Estimated body mass index is 27 kg/m as calculated from the  following:   Height as of this encounter: 5\' 2"  (1.575 m).   Weight as of this encounter: 147 lb 9.6 oz (67 kg).      Assessment:       ICD-10-CM   1. Stage 2 moderate COPD by GOLD classification ([place]) J44.9   2. COPD Z87.09 fluticasone furoate-vilanterol (BREO ELLIPTA) 200-25 MCG/INH AEPB  3. Status post pneumonectomy Z90.2        Plan:      Stable disaes Minimal baseline symptoms No evidence of PFO and right to left shunt due to right pneumonectomy  Plan Continue breoo 200/25 daily Albuterol as needed Flu shot in fall  Followu[ 9 months or sooner if needed; cAT score at followup  - if continuing to do well, then reduce breo dose at followup   Dr. Brand Males, M.D., [facility].C.P Pulmonary and Critical Care Medicine Staff Physician, [place] Director - Interstitial Lung Disease  Program  Pulmonary [place] [facility] [place], [place], [address]  Pager: [phone], If no answer or between  15:00h - 7:00h: call [phone] Telephone: [phone]

## 2017-11-09 ENCOUNTER — Inpatient Hospital Stay (HOSPITAL_BASED_OUTPATIENT_CLINIC_OR_DEPARTMENT_OTHER): Payer: Medicare Other | Admitting: Oncology

## 2017-11-09 ENCOUNTER — Inpatient Hospital Stay: Payer: Medicare Other

## 2017-11-09 ENCOUNTER — Encounter: Payer: Self-pay | Admitting: Oncology

## 2017-11-09 ENCOUNTER — Other Ambulatory Visit: Payer: Medicare Other

## 2017-11-09 ENCOUNTER — Telehealth: Payer: Self-pay | Admitting: Internal Medicine

## 2017-11-09 VITALS — BP 135/75 | HR 76 | Temp 98.3°F | Resp 20 | Ht 62.0 in | Wt 148.3 lb

## 2017-11-09 DIAGNOSIS — Z923 Personal history of irradiation: Secondary | ICD-10-CM | POA: Diagnosis not present

## 2017-11-09 DIAGNOSIS — I700 Atherosclerosis of aorta: Secondary | ICD-10-CM

## 2017-11-09 DIAGNOSIS — J449 Chronic obstructive pulmonary disease, unspecified: Secondary | ICD-10-CM

## 2017-11-09 DIAGNOSIS — Z8673 Personal history of transient ischemic attack (TIA), and cerebral infarction without residual deficits: Secondary | ICD-10-CM | POA: Diagnosis not present

## 2017-11-09 DIAGNOSIS — C3411 Malignant neoplasm of upper lobe, right bronchus or lung: Secondary | ICD-10-CM

## 2017-11-09 DIAGNOSIS — Z5112 Encounter for antineoplastic immunotherapy: Secondary | ICD-10-CM

## 2017-11-09 DIAGNOSIS — Z9221 Personal history of antineoplastic chemotherapy: Secondary | ICD-10-CM

## 2017-11-09 DIAGNOSIS — I10 Essential (primary) hypertension: Secondary | ICD-10-CM

## 2017-11-09 DIAGNOSIS — C3492 Malignant neoplasm of unspecified part of left bronchus or lung: Secondary | ICD-10-CM

## 2017-11-09 DIAGNOSIS — E785 Hyperlipidemia, unspecified: Secondary | ICD-10-CM | POA: Diagnosis not present

## 2017-11-09 DIAGNOSIS — Z79899 Other long term (current) drug therapy: Secondary | ICD-10-CM

## 2017-11-09 DIAGNOSIS — C3412 Malignant neoplasm of upper lobe, left bronchus or lung: Secondary | ICD-10-CM | POA: Diagnosis not present

## 2017-11-09 DIAGNOSIS — R590 Localized enlarged lymph nodes: Secondary | ICD-10-CM | POA: Diagnosis not present

## 2017-11-09 DIAGNOSIS — R5382 Chronic fatigue, unspecified: Secondary | ICD-10-CM

## 2017-11-09 DIAGNOSIS — Z7982 Long term (current) use of aspirin: Secondary | ICD-10-CM

## 2017-11-09 DIAGNOSIS — Z95828 Presence of other vascular implants and grafts: Secondary | ICD-10-CM

## 2017-11-09 LAB — CBC WITH DIFFERENTIAL (CANCER CENTER ONLY)
BASOS PCT: 1 %
Basophils Absolute: 0 10*3/uL (ref 0.0–0.1)
EOS ABS: 0.1 10*3/uL (ref 0.0–0.5)
EOS PCT: 2 %
HCT: 33.6 % — ABNORMAL LOW (ref 34.8–46.6)
Hemoglobin: 10.9 g/dL — ABNORMAL LOW (ref 11.6–15.9)
Lymphocytes Relative: 14 %
Lymphs Abs: 0.7 10*3/uL — ABNORMAL LOW (ref 0.9–3.3)
MCH: 26.9 pg (ref 25.1–34.0)
MCHC: 32.3 g/dL (ref 31.5–36.0)
MCV: 83.3 fL (ref 79.5–101.0)
MONO ABS: 0.5 10*3/uL (ref 0.1–0.9)
Monocytes Relative: 10 %
NEUTROS ABS: 3.6 10*3/uL (ref 1.5–6.5)
Neutrophils Relative %: 73 %
PLATELETS: 312 10*3/uL (ref 145–400)
RBC: 4.03 MIL/uL (ref 3.70–5.45)
RDW: 15.1 % — AB (ref 11.2–14.5)
WBC Count: 4.9 10*3/uL (ref 3.9–10.3)

## 2017-11-09 LAB — CMP (CANCER CENTER ONLY)
ALT: 14 U/L (ref 0–55)
ANION GAP: 8 (ref 3–11)
AST: 15 U/L (ref 5–34)
Albumin: 3.6 g/dL (ref 3.5–5.0)
Alkaline Phosphatase: 93 U/L (ref 40–150)
BUN: 13 mg/dL (ref 7–26)
CHLORIDE: 106 mmol/L (ref 98–109)
CO2: 28 mmol/L (ref 22–29)
Calcium: 9.1 mg/dL (ref 8.4–10.4)
Creatinine: 0.71 mg/dL (ref 0.60–1.10)
Glucose, Bld: 84 mg/dL (ref 70–140)
Potassium: 3.8 mmol/L (ref 3.5–5.1)
SODIUM: 142 mmol/L (ref 136–145)
Total Bilirubin: 0.3 mg/dL (ref 0.2–1.2)
Total Protein: 7 g/dL (ref 6.4–8.3)

## 2017-11-09 LAB — TSH: TSH: 1.906 u[IU]/mL (ref 0.308–3.960)

## 2017-11-09 MED ORDER — OXYCODONE-ACETAMINOPHEN 5-325 MG PO TABS: 1.0000 | ORAL_TABLET | Freq: Four times a day (QID) | ORAL | 0 refills | Status: DC | PRN

## 2017-11-09 MED ORDER — SODIUM CHLORIDE 0.9 % IV SOLN
9.3000 mg/kg | Freq: Once | INTRAVENOUS | Status: AC
  Administered 2017-11-09: 620 mg via INTRAVENOUS
  Filled 2017-11-09: qty 10

## 2017-11-09 MED ORDER — SODIUM CHLORIDE 0.9% FLUSH
10.0000 mL | INTRAVENOUS | Status: DC | PRN
  Administered 2017-11-09: 10 mL
  Filled 2017-11-09: qty 10

## 2017-11-09 MED ORDER — SODIUM CHLORIDE 0.9% FLUSH
10.0000 mL | Freq: Once | INTRAVENOUS | Status: AC
  Administered 2017-11-09: 10 mL
  Filled 2017-11-09: qty 10

## 2017-11-09 MED ORDER — SODIUM CHLORIDE 0.9 % IV SOLN
Freq: Once | INTRAVENOUS | Status: AC
  Administered 2017-11-09: 12:00:00 via INTRAVENOUS

## 2017-11-09 MED ORDER — HEPARIN SOD (PORK) LOCK FLUSH 100 UNIT/ML IV SOLN
500.0000 [IU] | Freq: Once | INTRAVENOUS | Status: AC | PRN
  Administered 2017-11-09: 500 [IU]
  Filled 2017-11-09: qty 5

## 2017-11-09 NOTE — Assessment & Plan Note (Signed)
This is a very pleasant 62 year old white female recently diagnosed with unresectable a stage IIB non-small cell lung cancer, adenocarcinoma. The patient underwent a course of concurrent chemoradiation with weekly carboplatin and paclitaxel status post 6 cycles.  She tolerated the treatment well and had partial response. She is currently undergoing consolidation treatment with immunotherapy with Imfinzi (Durvalumab) status post 15 cycles. The patient continues to tolerate this treatment well.  I recommended for her to proceed with cycle #16 today as a scheduled. She will come back for follow-up visit in 2 weeks for evaluation before cycle #17.  For her pain, I have refilled her Percocet today.  We discussed trying to take half a tablet at bedtime instead of a full tablet.  If she does well with this, we discussed taking half a tablet every other night.  We will reassess her pain at her next visit.  She was advised to call if she has any concerning symptoms in the interval. The patient voices understanding of current disease status and treatment options and is in agreement with the current care plan. All questions were answered. The patient knows to call the clinic with any problems, questions or concerns. We can certainly see the patient much sooner if necessary.

## 2017-11-09 NOTE — Telephone Encounter (Signed)
Appointments scheduled AVS/Calendar printed per 5/22 los

## 2017-11-09 NOTE — Patient Instructions (Signed)
[facility] Discharge Instructions for Patients Receiving Chemotherapy  Today you received the following chemotherapy agents: Imfinzi.  To help prevent nausea and vomiting after your treatment, we encourage you to take your nausea medication as directed.   If you develop nausea and vomiting that is not controlled by your nausea medication, call the clinic.   BELOW ARE SYMPTOMS THAT SHOULD BE REPORTED IMMEDIATELY:  *FEVER GREATER THAN 100.5 F  *CHILLS WITH OR WITHOUT FEVER  NAUSEA AND VOMITING THAT IS NOT CONTROLLED WITH YOUR NAUSEA MEDICATION  *UNUSUAL SHORTNESS OF BREATH  *UNUSUAL BRUISING OR BLEEDING  TENDERNESS IN MOUTH AND THROAT WITH OR WITHOUT PRESENCE OF ULCERS  *URINARY PROBLEMS  *BOWEL PROBLEMS  UNUSUAL RASH Items with * indicate a potential emergency and should be followed up as soon as possible.  Feel free to call the clinic should you have any questions or concerns. The clinic phone number is [phone].  Please show the CHEMO ALERT CARD at check-in to the Emergency Department and triage nurse.   

## 2017-11-09 NOTE — Progress Notes (Signed)
Felicia Herrera OFFICE PROGRESS NOTE  Felicia Felicia Herrera, [name], MD [place] [address]  DIAGNOSIS: Stage IIB(T2b, N1, M0) non-small cell lung cancer, adenocarcinoma presented with large left upper lobe lung mass in addition to left hilar lymphadenopathy diagnosed in April 2018. Her previous workup was done at [facility] in [facility]. There was insufficient material to perform the molecular studies.  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 Negative for EGFR, ALK, ROS1, BRAF mutations.  PRIOR THERAPY:  A course of concurrent chemoradiation with weekly carboplatin for AUC of 2 and paclitaxel 45 MG/M2. First dose 01/24/2017. Status post 6 cycles.  CURRENT THERAPY: Consolidation immunotherapy with Imfinzi (Durvalumab) 10 mg/KG every 2 weeks status post 15 cycles.  INTERVAL HISTORY: [name] female returns for routine follow-up visit by herself.  The patient is feeling fine today and has no specific complaints.  She denies fevers and chills.  Denies chest pain, shortness of breath, cough, hemoptysis.  Denies nausea, vomiting, constipation, diarrhea.  Denies skin rashes.  The patient's pain is well controlled with Percocet.  She requests a refill on this today.  She states she would like to try to wean off of this.  The patient is here for evaluation prior to cycle #16 of her treatment.  MEDICAL HISTORY: Past Medical History:  Diagnosis Date  . Aden[place] of [place] lung, stage 2 [place] 12/27/2016  . Cancer ([facility]    Lun cancer: Right Dx 2008, s/p lobectomy. Left Dx 09/2016  . COPD (chronic obstructive pulmonary disease) ([place]   . Encounter for antineoplastic chemotherapy 12/27/2016  . History of radiation therapy 01/24/17-03/08/17   left lung 2 Gy in 30 fractions  . Hyperlipidemia   . Hypertension   . Stroke ([facility]     ALLERGIES:  has No Known Allergies.  MEDICATIONS:  Current Outpatient Medications  Medication Sig  Dispense Refill  . aspirin EC 81 MG tablet Take 81 mg by mouth daily.    .Marland Kitchenatorvastatin (LIPITOR) 40 MG tablet Take 1 tablet (40 mg total) by mouth daily. 90 tablet 3  . fluticasone (FLONASE) 50 MCG/ACT nasal spray Place 1 spray into both nostrils 2 (two) times daily. 16 Felicia Herrera 6  . fluticasone furoate-vilanterol (BREO ELLIPTA) 200-25 MCG/INH AEPB Inhale 1 puff into the lungs daily. 90 each 1  . levothyroxine (SYNTHROID, LEVOTHROID) 75 MCG tablet Take 1 tablet (75 mcg total) by mouth daily before breakfast. 90 tablet 3  . omeprazole (PRILOSEC) 20 MG capsule Take 1 capsule (20 mg total) by mouth daily. 90 capsule 3  . oxyCODONE-acetaminophen (PERCOCET/ROXICET) 5-325 MG tablet Take 1 tablet by mouth every 6 (six) hours as needed for severe pain. 30 tablet 0  . prochlorperazine (COMPAZINE) 10 MG tablet Take 1 tablet (10 mg total) by mouth every 6 (six) hours as needed for nausea or vomiting. (Patient not taking: Reported on 11/07/2017) 30 tablet 0   No current facility-administered medications for this visit.    Facility-Administered Medications Ordered in Other Visits  Medication Dose Route Frequency Provider Last Rate Last Dose  . durvalumab (IMFINZI) 620 mg in sodium chloride 0.9 % 100 mL chemo infusion  9.3 mg/kg (Treatment Plan Recorded) Intravenous Once MCurt Bears MD 112 mL/hr at 11/09/17 1228 620 mg at 11/09/17 1228  . heparin lock flush 100 unit/mL  500 Units Intracatheter Once PRN MCurt Bears MD      . sodium chloride flush (NS) 0.9 % injection 10 mL  10 mL Intracatheter PRN MJulien Nordmann  Mohamed, MD   10 mL at 06/22/17 1747  . sodium chloride flush (NS) 0.9 % injection 10 mL  10 mL Intracatheter PRN Curt Bears, MD        SURGICAL HISTORY:  Past Surgical History:  Procedure Laterality Date  . BREAST BIOPSY     several. Denies Hx of breast cancer.  . IR FLUORO GUIDE PORT INSERTION RIGHT  02/04/2017  . IR US GUIDE VASC ACCESS RIGHT  02/04/2017  . THORACOTOMY/LOBECTOMY Right 2008   . TONSILLECTOMY  1960    REVIEW OF SYSTEMS:   Review of Systems  Constitutional: Negative for appetite change, chills, fatigue, fever and unexpected weight change.  HENT:   Negative for mouth sores, nosebleeds, sore throat and trouble swallowing.   Eyes: Negative for eye problems and icterus.  Respiratory: Negative for cough, hemoptysis, shortness of breath and wheezing.   Cardiovascular: Negative for chest pain and leg swelling.  Gastrointestinal: Negative for abdominal pain, constipation, diarrhea, nausea and vomiting.  Genitourinary: Negative for bladder incontinence, difficulty urinating, dysuria, frequency and hematuria.   Musculoskeletal: Negative for back pain, gait problem, neck pain and neck stiffness.  Skin: Negative for itching and rash.  Neurological: Negative for dizziness, extremity weakness, gait problem, headaches, light-headedness and seizures. Positive for neuropathic pain to her right chest. Hematological: Negative for adenopathy. Does not bruise/bleed easily.  Psychiatric/Behavioral: Negative for confusion, depression and sleep disturbance. The patient is not nervous/anxious.     PHYSICAL EXAMINATION:  Blood pressure 135/75, pulse 76, temperature 98.3 F (36.8 C), temperature source Oral, resp. rate 20, height '5\' 2"'$  (1.575 m), weight 148 lb 4.8 oz (67.3 kg), SpO2 99 %.  ECOG PERFORMANCE STATUS: 1 - Symptomatic but completely ambulatory  Physical Exam  Constitutional: Oriented to person, place, and time and well-developed, well-nourished, and in no distress. No distress.  HENT:  Head: Normocephalic and atraumatic.  Mouth/Throat: Oropharynx is clear and moist. No oropharyngeal exudate.  Eyes: Conjunctivae are normal. Right eye exhibits no discharge. Left eye exhibits no discharge. No scleral icterus.  Neck: Normal range of motion. Neck supple.  Cardiovascular: Normal rate, regular rhythm, normal heart sounds and intact distal pulses.   Pulmonary/Chest: Effort  normal and breath sounds normal. No respiratory distress. No wheezes. No rales.  Abdominal: Soft. Bowel sounds are normal. Exhibits no distension and no mass. There is no tenderness.  Musculoskeletal: Normal range of motion. Exhibits no edema.  Lymphadenopathy:    No cervical adenopathy.  Neurological: Alert and oriented to person, place, and time. Exhibits normal muscle tone. Gait normal. Coordination normal.  Skin: Skin is warm and dry. No rash noted. Not diaphoretic. No erythema. No pallor.  Psychiatric: Mood, memory and judgment normal.  Vitals reviewed.  LABORATORY DATA: Lab Results  Component Value Date   WBC 4.9 11/09/2017   HGB 10.9 (L) 11/09/2017   HCT 33.6 (L) 11/09/2017   MCV 83.3 11/09/2017   PLT 312 11/09/2017      Chemistry      Component Value Date/Time   NA 142 11/09/2017 1012   NA 142 06/22/2017 1335   K 3.8 11/09/2017 1012   K 3.9 06/22/2017 1335   CL 106 11/09/2017 1012   CO2 28 11/09/2017 1012   CO2 26 06/22/2017 1335   BUN 13 11/09/2017 1012   BUN 13.6 06/22/2017 1335   CREATININE 0.71 11/09/2017 1012   CREATININE 0.7 06/22/2017 1335      Component Value Date/Time   CALCIUM 9.1 11/09/2017 1012   CALCIUM 9.4 06/22/2017 1335  ALKPHOS 93 11/09/2017 1012   ALKPHOS 93 06/22/2017 1335   AST 15 11/09/2017 1012   AST 12 06/22/2017 1335   ALT 14 11/09/2017 1012   ALT 15 06/22/2017 1335   BILITOT 0.3 11/09/2017 1012   BILITOT 0.25 06/22/2017 1335       RADIOGRAPHIC STUDIES:  No results found.   ASSESSMENT/PLAN:  Adenocarcinoma of left lung, stage 2 ([place]) This is a very pleasant 62 year old white female recently diagnosed with unresectable a stage IIB non-small cell lung cancer, adenocarcinoma. The patient underwent a course of concurrent chemoradiation with weekly carboplatin and paclitaxel status post 6 cycles.  She tolerated the treatment well and had partial response. She is currently undergoing consolidation treatment with immunotherapy with  Imfinzi (Durvalumab) status post 15 cycles. The patient continues to tolerate this treatment well.  I recommended for her to proceed with cycle #16 today as a scheduled. She will come back for follow-up visit in 2 weeks for evaluation before cycle #17.  For her pain, I have refilled her Percocet today.  We discussed trying to take half a tablet at bedtime instead of a full tablet.  If she does well with this, we discussed taking half a tablet every other night.  We will reassess her pain at her next visit.  She was advised to call if she has any concerning symptoms in the interval. The patient voices understanding of current disease status and treatment options and is in agreement with the current care plan. All questions were answered. The patient knows to call the clinic with any problems, questions or concerns. We can certainly see the patient much sooner if necessary.   No orders of the defined types were placed in this encounter.  Mikey Bussing, DNP, AGPCNP-BC, AOCNP 11/09/17

## 2017-11-23 ENCOUNTER — Inpatient Hospital Stay: Payer: Medicare Other | Attending: Nurse Practitioner

## 2017-11-23 ENCOUNTER — Inpatient Hospital Stay: Payer: Medicare Other

## 2017-11-23 ENCOUNTER — Inpatient Hospital Stay (HOSPITAL_BASED_OUTPATIENT_CLINIC_OR_DEPARTMENT_OTHER): Payer: Medicare Other | Admitting: Internal Medicine

## 2017-11-23 ENCOUNTER — Encounter: Payer: Self-pay | Admitting: Internal Medicine

## 2017-11-23 ENCOUNTER — Telehealth: Payer: Self-pay | Admitting: Internal Medicine

## 2017-11-23 VITALS — BP 140/66 | HR 72 | Temp 98.3°F | Resp 18 | Ht 62.0 in | Wt 147.6 lb

## 2017-11-23 DIAGNOSIS — C3492 Malignant neoplasm of unspecified part of left bronchus or lung: Secondary | ICD-10-CM

## 2017-11-23 DIAGNOSIS — Z5112 Encounter for antineoplastic immunotherapy: Secondary | ICD-10-CM | POA: Insufficient documentation

## 2017-11-23 DIAGNOSIS — R0789 Other chest pain: Secondary | ICD-10-CM | POA: Diagnosis not present

## 2017-11-23 DIAGNOSIS — Z923 Personal history of irradiation: Secondary | ICD-10-CM

## 2017-11-23 DIAGNOSIS — Z9221 Personal history of antineoplastic chemotherapy: Secondary | ICD-10-CM

## 2017-11-23 DIAGNOSIS — C3412 Malignant neoplasm of upper lobe, left bronchus or lung: Secondary | ICD-10-CM | POA: Diagnosis not present

## 2017-11-23 DIAGNOSIS — Z79899 Other long term (current) drug therapy: Secondary | ICD-10-CM | POA: Diagnosis not present

## 2017-11-23 DIAGNOSIS — C3411 Malignant neoplasm of upper lobe, right bronchus or lung: Secondary | ICD-10-CM

## 2017-11-23 DIAGNOSIS — Z95828 Presence of other vascular implants and grafts: Secondary | ICD-10-CM

## 2017-11-23 LAB — CBC WITH DIFFERENTIAL (CANCER CENTER ONLY)
Basophils Absolute: 0 K/uL (ref 0.0–0.1)
Basophils Relative: 0 %
Eosinophils Absolute: 0.1 K/uL (ref 0.0–0.5)
Eosinophils Relative: 1 %
HCT: 33 % — ABNORMAL LOW (ref 34.8–46.6)
Hemoglobin: 10.7 g/dL — ABNORMAL LOW (ref 11.6–15.9)
Lymphocytes Relative: 14 %
Lymphs Abs: 1 K/uL (ref 0.9–3.3)
MCH: 27.6 pg (ref 25.1–34.0)
MCHC: 32.4 g/dL (ref 31.5–36.0)
MCV: 85.1 fL (ref 79.5–101.0)
Monocytes Absolute: 0.7 K/uL (ref 0.1–0.9)
Monocytes Relative: 9 %
Neutro Abs: 5.5 K/uL (ref 1.5–6.5)
Neutrophils Relative %: 76 %
Platelet Count: 312 K/uL (ref 145–400)
RBC: 3.88 MIL/uL (ref 3.70–5.45)
RDW: 13.9 % (ref 11.2–14.5)
WBC Count: 7.2 K/uL (ref 3.9–10.3)

## 2017-11-23 LAB — CMP (CANCER CENTER ONLY)
ALK PHOS: 94 U/L (ref 40–150)
ALT: 8 U/L (ref 0–55)
ANION GAP: 9 (ref 3–11)
AST: 12 U/L (ref 5–34)
Albumin: 3.6 g/dL (ref 3.5–5.0)
BILIRUBIN TOTAL: 0.3 mg/dL (ref 0.2–1.2)
BUN: 13 mg/dL (ref 7–26)
CALCIUM: 9.1 mg/dL (ref 8.4–10.4)
CO2: 27 mmol/L (ref 22–29)
CREATININE: 0.75 mg/dL (ref 0.60–1.10)
Chloride: 104 mmol/L (ref 98–109)
GFR, Est AFR Am: >60 mL/min (ref >60)
GFR, Estimated: >60 mL/min (ref >60)
Glucose, Bld: 91 mg/dL (ref 70–140)
Potassium: 3.8 mmol/L (ref 3.5–5.1)
SODIUM: 140 mmol/L (ref 136–145)
TOTAL PROTEIN: 7.2 g/dL (ref 6.4–8.3)

## 2017-11-23 MED ORDER — SODIUM CHLORIDE 0.9% FLUSH
10.0000 mL | INTRAVENOUS | Status: DC | PRN
  Administered 2017-11-23: 10 mL
  Filled 2017-11-23: qty 10

## 2017-11-23 MED ORDER — DURVALUMAB 500 MG/10ML IV SOLN
620.0000 mg | Freq: Once | INTRAVENOUS | Status: AC
  Administered 2017-11-23: 620 mg via INTRAVENOUS
  Filled 2017-11-23: qty 10

## 2017-11-23 MED ORDER — HEPARIN SOD (PORK) LOCK FLUSH 100 UNIT/ML IV SOLN
500.0000 [IU] | Freq: Once | INTRAVENOUS | Status: AC | PRN
Start: 2017-11-23 — End: 2017-11-23
  Administered 2017-11-23: 500 [IU]
  Filled 2017-11-23: qty 5

## 2017-11-23 MED ORDER — SODIUM CHLORIDE 0.9 % IV SOLN
Freq: Once | INTRAVENOUS | Status: AC
  Administered 2017-11-23: 11:00:00 via INTRAVENOUS

## 2017-11-23 MED ORDER — SODIUM CHLORIDE 0.9% FLUSH
10.0000 mL | Freq: Once | INTRAVENOUS | Status: AC
  Administered 2017-11-23: 10 mL
  Filled 2017-11-23: qty 10

## 2017-11-23 NOTE — Patient Instructions (Signed)
[facility] Discharge Instructions for Patients Receiving Chemotherapy  Today you received the following chemotherapy agents: Imfinzi.  To help prevent nausea and vomiting after your treatment, we encourage you to take your nausea medication as directed.   If you develop nausea and vomiting that is not controlled by your nausea medication, call the clinic.   BELOW ARE SYMPTOMS THAT SHOULD BE REPORTED IMMEDIATELY:  *FEVER GREATER THAN 100.5 F  *CHILLS WITH OR WITHOUT FEVER  NAUSEA AND VOMITING THAT IS NOT CONTROLLED WITH YOUR NAUSEA MEDICATION  *UNUSUAL SHORTNESS OF BREATH  *UNUSUAL BRUISING OR BLEEDING  TENDERNESS IN MOUTH AND THROAT WITH OR WITHOUT PRESENCE OF ULCERS  *URINARY PROBLEMS  *BOWEL PROBLEMS  UNUSUAL RASH Items with * indicate a potential emergency and should be followed up as soon as possible.  Feel free to call the clinic should you have any questions or concerns. The clinic phone number is [phone].  Please show the CHEMO ALERT CARD at check-in to the Emergency Department and triage nurse.   

## 2017-11-23 NOTE — Progress Notes (Signed)
[place] [phone]   [phone]  OFFICE PROGRESS NOTE  [name], [name], MD [place] [address]  DIAGNOSIS: Stage IIB (T2b, N1, M0) non-small cell lung cancer, adenocarcinoma presented with large left upper lobe lung mass in addition to left hilar lymphadenopathy diagnosed in April 2018. Her previous workup was done at [facility] in [facility]. There was insufficient material to perform the molecular studies.  GUARDANT 360 Molecular studies: BRCA2 N319f. Negative for EGFR, ALK, ROS1, BRAF mutations.  PRIOR THERAPY:  A course of concurrent chemoradiation with weekly carboplatin for AUC of 2 and paclitaxel 45 MG/M2. First dose 01/24/2017. Status post 6 cycles.  CURRENT THERAPY: Consolidation immunotherapy with Imfinzi (Durvalumab) 10 mg/KG every 2 weeks status post 16 cycles.  INTERVAL HISTORY: Felicia Dettmer89y.o. female returns to the clinic today for follow-up visit.  The patient is feeling fine today with no specific complaints except for sneezing and cough.  She is using Flonase and Prilosec with mild improvement.  She denied having any chest pain but continues to have shortness of breath with exertion with no hemoptysis.  She denied having any recent weight loss or night sweats.  She has no nausea, vomiting, diarrhea or constipation.  She continues to tolerate her treatment with Imfinzi (Durvalumab) fairly well.  She is here for evaluation before starting cycle #17.   MEDICAL HISTORY: Past Medical History:  Diagnosis Date  . [place], [place] 2 [place] 12/[place]  . Cancer ([facility]    Lun cancer: Right Dx 2008, s/p lobectomy. Left Dx 09/2016  . COPD (chronic obstructive pulmonary disease) ([facility]   . Encounter for antineoplastic chemotherapy 12/27/2016  . History of radiation therapy 01/24/17-03/08/17   left lung 2 Gy in 30 fractions  . Hyperlipidemia   . Hypertension   . Stroke  ([facility]     ALLERGIES:  has No Known Allergies.  MEDICATIONS:  Current Outpatient Medications  Medication Sig Dispense Refill  . aspirin EC 81 MG tablet Take 81 mg by mouth daily.    .Marland Kitchenatorvastatin (LIPITOR) 40 MG tablet Take 1 tablet (40 mg total) by mouth daily. 90 tablet 3  . fluticasone (FLONASE) 50 MCG/ACT nasal spray Place 1 spray into both nostrils 2 (two) times daily. 16 Herrera 6  . fluticasone furoate-vilanterol (BREO ELLIPTA) 200-25 MCG/INH AEPB Inhale 1 puff into the lungs daily. 90 each 1  . levothyroxine (SYNTHROID, LEVOTHROID) 75 MCG tablet Take 1 tablet (75 mcg total) by mouth daily before breakfast. 90 tablet 3  . omeprazole (PRILOSEC) 20 MG capsule Take 1 capsule (20 mg total) by mouth daily. 90 capsule 3  . oxyCODONE-acetaminophen (PERCOCET/ROXICET) 5-325 MG tablet Take 1 tablet by mouth every 6 (six) hours as needed for severe pain. 30 tablet 0  . prochlorperazine (COMPAZINE) 10 MG tablet Take 1 tablet (10 mg total) by mouth every 6 (six) hours as needed for nausea or vomiting. 30 tablet 0   No current facility-administered medications for this visit.    Facility-Administered Medications Ordered in Other Visits  Medication Dose Route Frequency Provider Last Rate Last Dose  . sodium chloride flush (NS) 0.9 % injection 10 mL  10 mL Intracatheter PRN MCurt Bears MD   10 mL at 06/22/17 1747    SURGICAL HISTORY:  Past Surgical History:  Procedure Laterality Date  . BREAST BIOPSY     several. Denies Hx of breast cancer.  . IR FLUORO GUIDE PORT  INSERTION RIGHT  02/04/2017  . IR US GUIDE VASC ACCESS RIGHT  02/04/2017  . THORACOTOMY/LOBECTOMY Right 2008  . TONSILLECTOMY  1960    REVIEW OF SYSTEMS:  A comprehensive review of systems was negative except for: Respiratory: positive for cough   PHYSICAL EXAMINATION: General appearance: alert, cooperative and no distress Head: Normocephalic, without obvious abnormality, atraumatic Neck: no adenopathy, no JVD, supple,  symmetrical, trachea midline and thyroid not enlarged, symmetric, no tenderness/mass/nodules Lymph nodes: Cervical, supraclavicular, and axillary nodes normal. Resp: clear to auscultation bilaterally Back: symmetric, no curvature. ROM normal. No CVA tenderness. Cardio: regular rate and rhythm, S1, S2 normal, no murmur, click, rub or gallop GI: soft, non-tender; bowel sounds normal; no masses,  no organomegaly Extremities: extremities normal, atraumatic, no cyanosis or edema  ECOG PERFORMANCE STATUS: 1 - Symptomatic but completely ambulatory  Blood pressure 140/66, pulse 72, temperature 98.3 F (36.8 C), temperature source Oral, resp. rate 18, height _0  (1.575 m), weight 147 lb 9.6 oz (67 kg), SpO2 94 %.  LABORATORY DATA: Lab Results  Component Value Date   WBC 7.2 11/23/2017   HGB 10.7 (L) 11/23/2017   HCT 33.0 (L) 11/23/2017   MCV 85.1 11/23/2017   PLT 312 11/23/2017      Chemistry      Component Value Date/Time   NA 140 11/23/2017 0903   NA 142 06/22/2017 1335   K 3.8 11/23/2017 0903   K 3.9 06/22/2017 1335   CL 104 11/23/2017 0903   CO2 27 11/23/2017 0903   CO2 26 06/22/2017 1335   BUN 13 11/23/2017 0903   BUN 13.6 06/22/2017 1335   CREATININE 0.75 11/23/2017 0903   CREATININE 0.7 06/22/2017 1335      Component Value Date/Time   CALCIUM 9.1 11/23/2017 0903   CALCIUM 9.4 06/22/2017 1335   ALKPHOS 94 11/23/2017 0903   ALKPHOS 93 06/22/2017 1335   AST 12 11/23/2017 0903   AST 12 06/22/2017 1335   ALT 8 11/23/2017 0903   ALT 15 06/22/2017 1335   BILITOT 0.3 11/23/2017 0903   BILITOT 0.25 06/22/2017 1335       RADIOGRAPHIC STUDIES: No results found.  ASSESSMENT AND PLAN: This is a very pleasant 62 years old white female recently diagnosed with unresectable a stage IIB non-small cell lung cancer, adenocarcinoma. The patient underwent a course of concurrent chemoradiation with weekly carboplatin and paclitaxel status post 6 cycles.  She tolerated the treatment  well and had partial response. She is currently undergoing consolidation treatment with immunotherapy with Imfinzi (Durvalumab) status post 16 cycles. The patient continues to tolerate this treatment well.  I recommended for her to proceed with cycle #17 today as a schedule.  I will see her back for follow-up visit in 2 weeks for evaluation before cycle #18.  She was advised to call immediately if she has any concerning symptoms in the interval. The patient voices understanding of current disease status and treatment options and is in agreement with the current care plan. All questions were answered. The patient knows to call the clinic with any problems, questions or concerns. We can certainly see the patient much sooner if necessary.  Disclaimer: This note was dictated with voice recognition software. Similar sounding words can inadvertently be transcribed and may not be corrected upon review.

## 2017-11-23 NOTE — Telephone Encounter (Signed)
appt already scheduled per 6/5 los - and treatment plan

## 2017-11-23 NOTE — Progress Notes (Signed)
Patient came in with a letter from patient accounting and [place] application.  Made copies and gave her originals back. Placed in-inter office mail envelope and sent to Patient Accounting/[employer] [address] as requested. Patient has my card for any additional financial questions or concerns.

## 2017-11-29 ENCOUNTER — Encounter: Payer: Self-pay | Admitting: Family Medicine

## 2017-12-06 NOTE — Assessment & Plan Note (Addendum)
This is a very pleasant 62 year old white female recently diagnosed with unresectable a stage IIB non-small cell lung cancer, adenocarcinoma. The patient underwent a course of concurrent chemoradiation with weekly carboplatin and paclitaxel status post 6 cycles.  She tolerated the treatment well and had partial response. She is currently undergoing consolidation treatment with immunotherapy with Imfinzi (Durvalumab) status post 17 cycles. The patient continues to tolerate this treatment well.  I recommended for her to proceed with cycle #18 today as a scheduled.  The patient will have a restaging CT scan of the chest prior to her next visit.  She will follow-up in 2 weeks for evaluation prior to cycle #19 and to review her restaging CT scan results.  For her neuropathic pain to her right chest, I have refilled her Percocet.  She was advised to call immediately if she has any concerning symptoms in the interval. The patient voices understanding of current disease status and treatment options and is in agreement with the current care plan. All questions were answered. The patient knows to call the clinic with any problems, questions or concerns. We can certainly see the patient much sooner if necessary.

## 2017-12-06 NOTE — Progress Notes (Signed)
[facility] OFFICE PROGRESS NOTE  [name], [name], MD [place] [address]  DIAGNOSIS: Stage IIB(T2b, N1, M0) non-small cell lung cancer, adenocarcinoma presented with large left upper lobe lung mass in addition to left hilar lymphadenopathy diagnosed in April 2018. Her previous workup was done at [facility] in [facility]. There was insufficient material to perform the molecular studies.  GUARDANT 360 Molecular studies: BRCA2 N373f. Negative for EGFR, ALK, ROS1, BRAF mutations.  PRIOR THERAPY: A course of concurrent chemoradiation with weekly carboplatin for AUC of 2 and paclitaxel 45 MG/M2. First dose 01/24/2017. Status post 6 cycles.  CURRENT THERAPY: Consolidation immunotherapy with Imfinzi (Durvalumab) 10 mg/KG every 2 weeks status post 17 cycles.  INTERVAL HISTORY: Felicia Len89y.o. female returns for routine follow-up visit by herself.  The patient is feeling fine today and has no specific complaints except for ongoing neuropathic pain to her right chest.  She uses Percocet at bedtime only.  She finds that this is effective.  She reports that she been more active.  She was able to take a walk with minimal rest breaks.  She denies fevers and chills.  Denies chest pain, shortness breath, hemoptysis.  She has an ongoing nonproductive cough.  Denies nausea, vomiting, constipation, diarrhea.  Denies recent weight loss or night sweats.  The patient is here for evaluation prior to cycle #18 of her treatment.  MEDICAL HISTORY: Past Medical History:  Diagnosis Date  . [place] 12/[place]  . Cancer ([facility]    Lun cancer: Right Dx 2008, s/p lobectomy. Left Dx 09/2016  . COPD [place]ch[place] obstructive pulmonary disease) ([facility] [place]   . Encounter for antineoplastic chemotherapy 12/27/2016  . History of radiation therapy 01/24/17-03/08/17   left lung 2 Gy in 30 fractions  . Hyperlipidemia   . Hypertension   .  Stroke ([facility]     ALLERGIES:  has No Known Allergies.  MEDICATIONS:  Current Outpatient Medications  Medication Sig Dispense Refill  . aspirin EC 81 MG tablet Take 81 mg by mouth daily.    .Marland Kitchenatorvastatin (LIPITOR) 40 MG tablet Take 1 tablet (40 mg total) by mouth daily. 90 tablet 3  . fluticasone (FLONASE) 50 MCG/ACT nasal spray Place 1 spray into both nostrils 2 (two) times daily. 16 g 6  . fluticasone furoate-vilanterol (BREO ELLIPTA) 200-25 MCG/INH AEPB Inhale 1 puff into the lungs daily. 90 each 1  . levothyroxine (SYNTHROID, LEVOTHROID) 75 MCG tablet Take 1 tablet (75 mcg total) by mouth daily before breakfast. 90 tablet 3  . omeprazole (PRILOSEC) 20 MG capsule Take 1 capsule (20 mg total) by mouth daily. 90 capsule 3  . oxyCODONE-acetaminophen (PERCOCET/ROXICET) 5-325 MG tablet Take 1 tablet by mouth every 6 (six) hours as needed for severe pain. 30 tablet 0  . prochlorperazine (COMPAZINE) 10 MG tablet Take 1 tablet (10 mg total) by mouth every 6 (six) hours as needed for nausea or vomiting. 30 tablet 0   No current facility-administered medications for this visit.    Facility-Administered Medications Ordered in Other Visits  Medication Dose Route Frequency Provider Last Rate Last Dose  . sodium chloride flush (NS) 0.9 % injection 10 mL  10 mL Intracatheter PRN MCurt Bears MD   10 mL at 06/22/17 1747    SURGICAL HISTORY:  Past Surgical History:  Procedure Laterality Date  . BREAST BIOPSY     several. Denies Hx of breast cancer.  . IR FLUORO GUIDE  PORT INSERTION RIGHT  02/04/2017  . IR US GUIDE VASC ACCESS RIGHT  02/04/2017  . THORACOTOMY/LOBECTOMY Right 2008  . TONSILLECTOMY  1960    REVIEW OF SYSTEMS:   Review of Systems  Constitutional: Negative for appetite change, chills, fatigue, fever and unexpected weight change.  HENT:   Negative for mouth sores, nosebleeds, sore throat and trouble swallowing.   Eyes: Negative for eye problems and icterus.  Respiratory:  Negative for hemoptysis, shortness of breath and wheezing.  Positive for nonproductive cough. Cardiovascular: Negative for leg swelling. Positive for right neuropathic chest pain. Gastrointestinal: Negative for abdominal pain, constipation, diarrhea, nausea and vomiting.  Genitourinary: Negative for bladder incontinence, difficulty urinating, dysuria, frequency and hematuria.   Musculoskeletal: Negative for back pain, gait problem, neck pain and neck stiffness.  Skin: Negative for itching and rash.  Neurological: Negative for dizziness, extremity weakness, gait problem, headaches, light-headedness and seizures.  Hematological: Negative for adenopathy. Does not bruise/bleed easily.  Psychiatric/Behavioral: Negative for confusion, depression and sleep disturbance. The patient is not nervous/anxious.     PHYSICAL EXAMINATION:  Blood pressure 135/69, pulse 70, temperature 97.9 F (36.6 C), temperature source Oral, resp. rate 18, height '5\' 2"'$  (1.575 m), weight 148 lb 14.4 oz (67.5 kg), SpO2 96 %.  ECOG PERFORMANCE STATUS: 1 - Symptomatic but completely ambulatory  Physical Exam  Constitutional: Oriented to person, place, and time and well-developed, well-nourished, and in no distress. No distress.  HENT:  Head: Normocephalic and atraumatic.  Mouth/Throat: Oropharynx is clear and moist. No oropharyngeal exudate.  Eyes: Conjunctivae are normal. Right eye exhibits no discharge. Left eye exhibits no discharge. No scleral icterus.  Neck: Normal range of motion. Neck supple.  Cardiovascular: Normal rate, regular rhythm, normal heart sounds and intact distal pulses.   Pulmonary/Chest: Effort normal and breath sounds normal. No respiratory distress. No wheezes. No rales.  Abdominal: Soft. Bowel sounds are normal. Exhibits no distension and no mass. There is no tenderness.  Musculoskeletal: Normal range of motion. Exhibits no edema.  Lymphadenopathy:    No cervical adenopathy.  Neurological: Alert  and oriented to person, place, and time. Exhibits normal muscle tone. Gait normal. Coordination normal.  Skin: Skin is warm and dry. No rash noted. Not diaphoretic. No erythema. No pallor.  Psychiatric: Mood, memory and judgment normal.  Vitals reviewed.  LABORATORY DATA: Lab Results  Component Value Date   WBC 7.4 12/07/2017   HGB 11.1 (L) 12/07/2017   HCT 33.8 (L) 12/07/2017   MCV 83.2 12/07/2017   PLT 339 12/07/2017      Chemistry      Component Value Date/Time   NA 142 12/07/2017 0935   NA 142 06/22/2017 1335   K 3.6 12/07/2017 0935   K 3.9 06/22/2017 1335   CL 106 12/07/2017 0935   CO2 28 12/07/2017 0935   CO2 26 06/22/2017 1335   BUN 14 12/07/2017 0935   BUN 13.6 06/22/2017 1335   CREATININE 0.75 12/07/2017 0935   CREATININE 0.7 06/22/2017 1335      Component Value Date/Time   CALCIUM 9.3 12/07/2017 0935   CALCIUM 9.4 06/22/2017 1335   ALKPHOS 100 12/07/2017 0935   ALKPHOS 93 06/22/2017 1335   AST 16 12/07/2017 0935   AST 12 06/22/2017 1335   ALT 13 12/07/2017 0935   ALT 15 06/22/2017 1335   BILITOT 0.4 12/07/2017 0935   BILITOT 0.25 06/22/2017 1335       RADIOGRAPHIC STUDIES:  No results found.   ASSESSMENT/PLAN:  Adenocarcinoma of left  lung, stage 2 ([place]) This is a very pleasant 62 year old white female recently diagnosed with unresectable a stage IIB non-small cell lung cancer, adenocarcinoma. The patient underwent a course of concurrent chemoradiation with weekly carboplatin and paclitaxel status post 6 cycles.  She tolerated the treatment well and had partial response. She is currently undergoing consolidation treatment with immunotherapy with Imfinzi (Durvalumab) status post 17 cycles. The patient continues to tolerate this treatment well.  I recommended for her to proceed with cycle #18 today as a scheduled.  The patient will have a restaging CT scan of the chest prior to her next visit.  She will follow-up in 2 weeks for evaluation prior to cycle  #19 and to review her restaging CT scan results.  For her neuropathic pain to her right chest, I have refilled her Percocet.  She was advised to call immediately if she has any concerning symptoms in the interval. The patient voices understanding of current disease status and treatment options and is in agreement with the current care plan. All questions were answered. The patient knows to call the clinic with any problems, questions or concerns. We can certainly see the patient much sooner if necessary.   Orders Placed This Encounter  Procedures  . CT CHEST W CONTRAST    Standing Status:   Future    Standing Expiration Date:   12/08/2018    Order Specific Question:   If indicated for the ordered procedure, I authorize the administration of contrast media per Radiology protocol    Answer:   Yes    Order Specific Question:   Preferred imaging location?    Answer:   [facility]    Order Specific Question:   Radiology Contrast Protocol - do NOT remove file path    Answer:   [email or web address]    Order Specific Question:   ** REASON FOR EXAM (FREE TEXT)    Answer:   Lung cancer. Restaging.    Order Specific Question:   Is patient pregnant?    Answer:   No   Mikey Bussing, DNP, AGPCNP-BC, AOCNP 12/07/17

## 2017-12-07 ENCOUNTER — Encounter: Payer: Self-pay | Admitting: Oncology

## 2017-12-07 ENCOUNTER — Telehealth: Payer: Self-pay | Admitting: Oncology

## 2017-12-07 ENCOUNTER — Inpatient Hospital Stay: Payer: Medicare Other

## 2017-12-07 ENCOUNTER — Other Ambulatory Visit: Payer: Self-pay

## 2017-12-07 ENCOUNTER — Inpatient Hospital Stay (HOSPITAL_BASED_OUTPATIENT_CLINIC_OR_DEPARTMENT_OTHER): Payer: Medicare Other | Admitting: Oncology

## 2017-12-07 VITALS — BP 135/69 | HR 70 | Temp 97.9°F | Resp 18 | Ht 62.0 in | Wt 148.9 lb

## 2017-12-07 DIAGNOSIS — C3412 Malignant neoplasm of upper lobe, left bronchus or lung: Secondary | ICD-10-CM | POA: Diagnosis not present

## 2017-12-07 DIAGNOSIS — C3411 Malignant neoplasm of upper lobe, right bronchus or lung: Secondary | ICD-10-CM

## 2017-12-07 DIAGNOSIS — R0789 Other chest pain: Secondary | ICD-10-CM | POA: Diagnosis not present

## 2017-12-07 DIAGNOSIS — Z79899 Other long term (current) drug therapy: Secondary | ICD-10-CM | POA: Diagnosis not present

## 2017-12-07 DIAGNOSIS — R5382 Chronic fatigue, unspecified: Secondary | ICD-10-CM

## 2017-12-07 DIAGNOSIS — C3492 Malignant neoplasm of unspecified part of left bronchus or lung: Secondary | ICD-10-CM

## 2017-12-07 DIAGNOSIS — Z5112 Encounter for antineoplastic immunotherapy: Secondary | ICD-10-CM

## 2017-12-07 DIAGNOSIS — Z9221 Personal history of antineoplastic chemotherapy: Secondary | ICD-10-CM | POA: Diagnosis not present

## 2017-12-07 DIAGNOSIS — Z95828 Presence of other vascular implants and grafts: Secondary | ICD-10-CM

## 2017-12-07 DIAGNOSIS — Z923 Personal history of irradiation: Secondary | ICD-10-CM

## 2017-12-07 LAB — CBC WITH DIFFERENTIAL (CANCER CENTER ONLY)
BASOS ABS: 0.1 10*3/uL (ref 0.0–0.1)
Basophils Relative: 1 %
EOS ABS: 0.1 10*3/uL (ref 0.0–0.5)
Eosinophils Relative: 1 %
HCT: 33.8 % — ABNORMAL LOW (ref 34.8–46.6)
Hemoglobin: 11.1 g/dL — ABNORMAL LOW (ref 11.6–15.9)
Lymphocytes Relative: 11 %
Lymphs Abs: 0.8 10*3/uL — ABNORMAL LOW (ref 0.9–3.3)
MCH: 27.2 pg (ref 25.1–34.0)
MCHC: 32.7 g/dL (ref 31.5–36.0)
MCV: 83.2 fL (ref 79.5–101.0)
Monocytes Absolute: 0.5 10*3/uL (ref 0.1–0.9)
Monocytes Relative: 7 %
Neutro Abs: 5.9 10*3/uL (ref 1.5–6.5)
Neutrophils Relative %: 80 %
PLATELETS: 339 10*3/uL (ref 145–400)
RBC: 4.06 MIL/uL (ref 3.70–5.45)
RDW: 14 % (ref 11.2–14.5)
WBC: 7.4 10*3/uL (ref 3.9–10.3)

## 2017-12-07 LAB — CMP (CANCER CENTER ONLY)
ALT: 13 U/L (ref 0–55)
AST: 16 U/L (ref 5–34)
Albumin: 3.7 g/dL (ref 3.5–5.0)
Alkaline Phosphatase: 100 U/L (ref 40–150)
Anion gap: 8 (ref 3–11)
BILIRUBIN TOTAL: 0.4 mg/dL (ref 0.2–1.2)
BUN: 14 mg/dL (ref 7–26)
CO2: 28 mmol/L (ref 22–29)
CREATININE: 0.75 mg/dL (ref 0.60–1.10)
Calcium: 9.3 mg/dL (ref 8.4–10.4)
Chloride: 106 mmol/L (ref 98–109)
GFR, Est AFR Am: >60 mL/min (ref >60)
GFR, Estimated: >60 mL/min (ref >60)
Glucose, Bld: 94 mg/dL (ref 70–140)
Potassium: 3.6 mmol/L (ref 3.5–5.1)
Sodium: 142 mmol/L (ref 136–145)
TOTAL PROTEIN: 7.2 g/dL (ref 6.4–8.3)

## 2017-12-07 LAB — TSH: TSH: 2.887 u[IU]/mL (ref 0.308–3.960)

## 2017-12-07 MED ORDER — HEPARIN SOD (PORK) LOCK FLUSH 100 UNIT/ML IV SOLN
500.0000 [IU] | Freq: Once | INTRAVENOUS | Status: AC | PRN
  Administered 2017-12-07: 500 [IU]
  Filled 2017-12-07: qty 5

## 2017-12-07 MED ORDER — SODIUM CHLORIDE 0.9% FLUSH
10.0000 mL | Freq: Once | INTRAVENOUS | Status: AC
  Administered 2017-12-07: 10 mL
  Filled 2017-12-07: qty 10

## 2017-12-07 MED ORDER — SODIUM CHLORIDE 0.9 % IV SOLN
Freq: Once | INTRAVENOUS | Status: AC
  Administered 2017-12-07: 11:00:00 via INTRAVENOUS

## 2017-12-07 MED ORDER — OXYCODONE-ACETAMINOPHEN 5-325 MG PO TABS: 1.0000 | ORAL_TABLET | Freq: Four times a day (QID) | ORAL | 0 refills | Status: DC | PRN

## 2017-12-07 MED ORDER — SODIUM CHLORIDE 0.9% FLUSH
10.0000 mL | INTRAVENOUS | Status: DC | PRN
  Administered 2017-12-07: 10 mL
  Filled 2017-12-07: qty 10

## 2017-12-07 MED ORDER — SODIUM CHLORIDE 0.9 % IV SOLN
620.0000 mg | Freq: Once | INTRAVENOUS | Status: AC
  Administered 2017-12-07: 620 mg via INTRAVENOUS
  Filled 2017-12-07: qty 2.4

## 2017-12-07 NOTE — Patient Instructions (Signed)
[facility] Discharge Instructions for Patients Receiving Chemotherapy  Today you received the following chemotherapy agents Imfinzi.  To help prevent nausea and vomiting after your treatment, we encourage you to take your nausea medication as prescribed.   If you develop nausea and vomiting that is not controlled by your nausea medication, call the clinic.   BELOW ARE SYMPTOMS THAT SHOULD BE REPORTED IMMEDIATELY:  *FEVER GREATER THAN 100.5 F  *CHILLS WITH OR WITHOUT FEVER  NAUSEA AND VOMITING THAT IS NOT CONTROLLED WITH YOUR NAUSEA MEDICATION  *UNUSUAL SHORTNESS OF BREATH  *UNUSUAL BRUISING OR BLEEDING  TENDERNESS IN MOUTH AND THROAT WITH OR WITHOUT PRESENCE OF ULCERS  *URINARY PROBLEMS  *BOWEL PROBLEMS  UNUSUAL RASH Items with * indicate a potential emergency and should be followed up as soon as possible.  Feel free to call the clinic should you have any questions or concerns. The clinic phone number is [phone].  Please show the [place] at check-in to the Emergency Department and triage nurse.

## 2017-12-07 NOTE — Telephone Encounter (Signed)
Gave pt avs and calendar with appts per 6/19 los.  °

## 2017-12-08 ENCOUNTER — Encounter: Payer: Self-pay | Admitting: Family Medicine

## 2017-12-09 ENCOUNTER — Other Ambulatory Visit: Payer: Self-pay | Admitting: *Deleted

## 2017-12-09 DIAGNOSIS — Z1239 Encounter for other screening for malignant neoplasm of breast: Secondary | ICD-10-CM

## 2017-12-13 ENCOUNTER — Encounter: Payer: Self-pay | Admitting: Oncology

## 2017-12-19 ENCOUNTER — Ambulatory Visit (HOSPITAL_COMMUNITY)
Admission: RE | Admit: 2017-12-19 | Discharge: 2017-12-19 | Disposition: A | Discharge status: Home / Self Care | Payer: Medicare Other | Source: Ambulatory Visit | Attending: Oncology | Admitting: Oncology

## 2017-12-19 ENCOUNTER — Encounter (HOSPITAL_COMMUNITY): Payer: Self-pay

## 2017-12-19 DIAGNOSIS — C3492 Malignant neoplasm of unspecified part of left bronchus or lung: Secondary | ICD-10-CM | POA: Diagnosis not present

## 2017-12-19 DIAGNOSIS — Y842 Radiological procedure and radiotherapy as the cause of abnormal reaction of the patient, or of later complication, without mention of misadventure at the time of the procedure: Secondary | ICD-10-CM | POA: Insufficient documentation

## 2017-12-19 DIAGNOSIS — Z902 Acquired absence of lung [part of]: Secondary | ICD-10-CM | POA: Insufficient documentation

## 2017-12-19 DIAGNOSIS — J984 Other disorders of lung: Secondary | ICD-10-CM | POA: Insufficient documentation

## 2017-12-19 MED ORDER — HEPARIN SOD (PORK) LOCK FLUSH 100 UNIT/ML IV SOLN: 500.0000 [IU] | Freq: Once | INTRAVENOUS | Status: DC

## 2017-12-19 MED ORDER — IOHEXOL 300 MG/ML  SOLN
75.0000 mL | Freq: Once | INTRAMUSCULAR | Status: AC | PRN
  Administered 2017-12-19: 75 mL via INTRAVENOUS

## 2017-12-19 MED ORDER — HEPARIN SOD (PORK) LOCK FLUSH 100 UNIT/ML IV SOLN
INTRAVENOUS | Status: AC
  Administered 2017-12-19: 500 [IU]
  Filled 2017-12-19: qty 5

## 2017-12-21 ENCOUNTER — Inpatient Hospital Stay: Payer: Medicare Other

## 2017-12-21 ENCOUNTER — Encounter: Payer: Self-pay | Admitting: Oncology

## 2017-12-21 ENCOUNTER — Inpatient Hospital Stay (HOSPITAL_BASED_OUTPATIENT_CLINIC_OR_DEPARTMENT_OTHER): Payer: Medicare Other | Admitting: Oncology

## 2017-12-21 ENCOUNTER — Inpatient Hospital Stay: Payer: Medicare Other | Attending: Nurse Practitioner

## 2017-12-21 VITALS — BP 127/73 | HR 82 | Temp 97.9°F | Resp 18 | Ht 62.0 in | Wt 148.4 lb

## 2017-12-21 DIAGNOSIS — J449 Chronic obstructive pulmonary disease, unspecified: Secondary | ICD-10-CM | POA: Insufficient documentation

## 2017-12-21 DIAGNOSIS — Z8673 Personal history of transient ischemic attack (TIA), and cerebral infarction without residual deficits: Secondary | ICD-10-CM | POA: Diagnosis not present

## 2017-12-21 DIAGNOSIS — Z9221 Personal history of antineoplastic chemotherapy: Secondary | ICD-10-CM | POA: Diagnosis not present

## 2017-12-21 DIAGNOSIS — Z923 Personal history of irradiation: Secondary | ICD-10-CM | POA: Insufficient documentation

## 2017-12-21 DIAGNOSIS — R21 Rash and other nonspecific skin eruption: Secondary | ICD-10-CM | POA: Diagnosis not present

## 2017-12-21 DIAGNOSIS — C3492 Malignant neoplasm of unspecified part of left bronchus or lung: Secondary | ICD-10-CM

## 2017-12-21 DIAGNOSIS — Z7982 Long term (current) use of aspirin: Secondary | ICD-10-CM | POA: Insufficient documentation

## 2017-12-21 DIAGNOSIS — E785 Hyperlipidemia, unspecified: Secondary | ICD-10-CM | POA: Diagnosis not present

## 2017-12-21 DIAGNOSIS — I10 Essential (primary) hypertension: Secondary | ICD-10-CM | POA: Insufficient documentation

## 2017-12-21 DIAGNOSIS — Z5112 Encounter for antineoplastic immunotherapy: Secondary | ICD-10-CM

## 2017-12-21 DIAGNOSIS — C3411 Malignant neoplasm of upper lobe, right bronchus or lung: Secondary | ICD-10-CM

## 2017-12-21 DIAGNOSIS — Z79899 Other long term (current) drug therapy: Secondary | ICD-10-CM | POA: Insufficient documentation

## 2017-12-21 DIAGNOSIS — C3412 Malignant neoplasm of upper lobe, left bronchus or lung: Secondary | ICD-10-CM | POA: Diagnosis not present

## 2017-12-21 DIAGNOSIS — Z95828 Presence of other vascular implants and grafts: Secondary | ICD-10-CM

## 2017-12-21 LAB — CMP (CANCER CENTER ONLY)
ALK PHOS: 116 U/L (ref 38–126)
ALT: 11 U/L (ref 0–44)
ANION GAP: 8 (ref 5–15)
AST: 12 U/L — ABNORMAL LOW (ref 15–41)
Albumin: 3.8 g/dL (ref 3.5–5.0)
BILIRUBIN TOTAL: 0.5 mg/dL (ref 0.3–1.2)
BUN: 11 mg/dL (ref 6–20)
CALCIUM: 9.5 mg/dL (ref 8.9–10.3)
CO2: 29 mmol/L (ref 22–32)
CREATININE: 0.8 mg/dL (ref 0.44–1.00)
Chloride: 106 mmol/L (ref 98–111)
Glucose, Bld: 103 mg/dL — ABNORMAL HIGH (ref 70–99)
Potassium: 3.8 mmol/L (ref 3.5–5.1)
SODIUM: 143 mmol/L (ref 135–145)
TOTAL PROTEIN: 7.2 g/dL (ref 6.5–8.1)

## 2017-12-21 LAB — CBC WITH DIFFERENTIAL (CANCER CENTER ONLY)
BASOS ABS: 0.1 10*3/uL (ref 0.0–0.1)
BASOS PCT: 1 %
Eosinophils Absolute: 0.1 10*3/uL (ref 0.0–0.5)
Eosinophils Relative: 1 %
HEMATOCRIT: 34.8 % (ref 34.8–46.6)
HEMOGLOBIN: 11.3 g/dL — AB (ref 11.6–15.9)
Lymphocytes Relative: 11 %
Lymphs Abs: 0.8 10*3/uL — ABNORMAL LOW (ref 0.9–3.3)
MCH: 26.9 pg (ref 25.1–34.0)
MCHC: 32.4 g/dL (ref 31.5–36.0)
MCV: 83 fL (ref 79.5–101.0)
MONOS PCT: 6 %
Monocytes Absolute: 0.4 10*3/uL (ref 0.1–0.9)
NEUTROS ABS: 5.4 10*3/uL (ref 1.5–6.5)
NEUTROS PCT: 81 %
Platelet Count: 333 10*3/uL (ref 145–400)
RBC: 4.2 MIL/uL (ref 3.70–5.45)
RDW: 13.9 % (ref 11.2–14.5)
WBC: 6.7 10*3/uL (ref 3.9–10.3)

## 2017-12-21 MED ORDER — SODIUM CHLORIDE 0.9% FLUSH
10.0000 mL | INTRAVENOUS | Status: DC | PRN
  Administered 2017-12-21: 10 mL
  Filled 2017-12-21: qty 10

## 2017-12-21 MED ORDER — HEPARIN SOD (PORK) LOCK FLUSH 100 UNIT/ML IV SOLN
500.0000 [IU] | Freq: Once | INTRAVENOUS | Status: AC | PRN
  Administered 2017-12-21: 500 [IU]
  Filled 2017-12-21: qty 5

## 2017-12-21 MED ORDER — SODIUM CHLORIDE 0.9% FLUSH
10.0000 mL | Freq: Once | INTRAVENOUS | Status: DC
  Filled 2017-12-21: qty 10

## 2017-12-21 MED ORDER — SODIUM CHLORIDE 0.9 % IV SOLN
Freq: Once | INTRAVENOUS | Status: AC
  Administered 2017-12-21: 14:00:00 via INTRAVENOUS

## 2017-12-21 MED ORDER — SODIUM CHLORIDE 0.9 % IV SOLN
620.0000 mg | Freq: Once | INTRAVENOUS | Status: AC
  Administered 2017-12-21: 620 mg via INTRAVENOUS
  Filled 2017-12-21: qty 2.4

## 2017-12-21 MED ORDER — NYSTATIN 100000 UNIT/GM EX CREA: 1.0000 "application " | TOPICAL_CREAM | Freq: Two times a day (BID) | CUTANEOUS | 0 refills | Status: DC

## 2017-12-21 NOTE — Patient Instructions (Signed)
[facility] Discharge Instructions for Patients Receiving Chemotherapy  Today you received the following chemotherapy agents: Imfinzi.  To help prevent nausea and vomiting after your treatment, we encourage you to take your nausea medication as directed.   If you develop nausea and vomiting that is not controlled by your nausea medication, call the clinic.   BELOW ARE SYMPTOMS THAT SHOULD BE REPORTED IMMEDIATELY:  *FEVER GREATER THAN 100.5 F  *CHILLS WITH OR WITHOUT FEVER  NAUSEA AND VOMITING THAT IS NOT CONTROLLED WITH YOUR NAUSEA MEDICATION  *UNUSUAL SHORTNESS OF BREATH  *UNUSUAL BRUISING OR BLEEDING  TENDERNESS IN MOUTH AND THROAT WITH OR WITHOUT PRESENCE OF ULCERS  *URINARY PROBLEMS  *BOWEL PROBLEMS  UNUSUAL RASH Items with * indicate a potential emergency and should be followed up as soon as possible.  Feel free to call the clinic should you have any questions or concerns. The clinic phone number is [phone].  Please show the CHEMO ALERT CARD at check-in to the Emergency Department and triage nurse.   

## 2017-12-21 NOTE — Assessment & Plan Note (Signed)
This is a very pleasant 62 year old white female recently diagnosed with unresectable a stage IIB non-small cell lung cancer, adenocarcinoma. The patient underwent a course of concurrent chemoradiation with weekly carboplatin and paclitaxel status post 6 cycles. She tolerated the treatment well and had partial response. She is currently undergoing consolidation treatment with immunotherapy with Imfinzi (Durvalumab) status post 18cycles. The patient continues to tolerate this treatment well.  She had a restaging CT scan of the chest and is here to discuss the results.  The patient was seen with Dr. Julien Nordmann.  CT scan results were discussed with the patient which showed no evidence of disease progression.  Recommend for her to proceed with cycle #19 of Infinzi today as scheduled. She will follow-up in 2 weeks for evaluation prior to cycle #20.  For her right chest candidiasis, I have recommend that she try to keep this area as dry as possible.  She was given a prescription for nystatin cream to use twice a day on this area.  For her neuropathic pain to her right chest.  She was advised to call immediately if she has any concerning symptoms in the interval. The patient voices understanding of current disease status and treatment options and is in agreement with the current care plan. All questions were answered. The patient knows to call the clinic with any problems, questions or concerns. We can certainly see the patient much sooner if necessary.

## 2017-12-21 NOTE — Progress Notes (Signed)
[facility] OFFICE PROGRESS NOTE  [name], [name], MD [place] [address]  DIAGNOSIS: Stage IIB(T2b, N1, M0) non-small cell lung cancer, adenocarcinoma presented with large left upper lobe lung mass in addition to left hilar lymphadenopathy diagnosed in April 2018. Her previous workup was done at [facility] in [facility]. There was insufficient material to perform the molecular studies.  GUARDANT 360 Molecular studies:BRCA2 N340f. Negative for EGFR, ALK, ROS1, BRAF mutations.  PRIOR THERAPY:A course of concurrent chemoradiation with weekly carboplatin for AUC of 2 and paclitaxel 45 MG/M2. First dose 01/24/2017. Status post 6 cycles.  CURRENT THERAPY: Consolidation immunotherapy with Imfinzi (Durvalumab) 10 mg/KG every 2 weeks status post 18cycles.  INTERVAL HISTORY: Felicia Funderburke62y.o. female returns for routine follow-up visit by herself.  Patient is feeling fine and has no specific complaints of the for ongoing neuropathic pain to her right chest and she notes a rash to this area as well.  She would like this looked at today.  She uses Percocet at bedtime every few days.  She reports that this is been effective for controlling her neuropathic pains.  She denies fevers and chills.  Denies chest pain, shortness breath, cough, hemoptysis.  Denies nausea, vomiting, constipation, diarrhea.  Denies recent weight loss or night sweats.  The patient is here for evaluation prior to cycle #19 of her treatment and to review her restaging CT scan results.  MEDICAL HISTORY: Past Medical History:  Diagnosis Date  . [place]  . [facility] ([facility]    Lun cancer: Right Dx 2008, s/p lobectomy. Left Dx 09/2016  . [place] disease[place]   . Encounter for antineoplastic chemotherapy 12/27/2016  . History of radiation therapy 01/24/17-03/08/17   left lung 2 Gy in 30 fractions  .  Hyperlipidemia   . Hypertension   . Stroke ([facility]     ALLERGIES:  has No Known Allergies.  MEDICATIONS:  Current Outpatient Medications  Medication Sig Dispense Refill  . aspirin EC 81 MG tablet Take 81 mg by mouth daily.    .Marland Kitchenatorvastatin (LIPITOR) 40 MG tablet Take 1 tablet (40 mg total) by mouth daily. 90 tablet 3  . fluticasone (FLONASE) 50 MCG/ACT nasal spray Place 1 spray into both nostrils 2 (two) times daily. 16 Herrera 6  . fluticasone furoate-vilanterol (BREO ELLIPTA) 200-25 MCG/INH AEPB Inhale 1 puff into the lungs daily. 90 each 1  . levothyroxine (SYNTHROID, LEVOTHROID) 75 MCG tablet Take 1 tablet (75 mcg total) by mouth daily before breakfast. 90 tablet 3  . nystatin cream (MYCOSTATIN) Apply 1 application topically 2 (two) times daily. 30 Herrera 0  . omeprazole (PRILOSEC) 20 MG capsule Take 1 capsule (20 mg total) by mouth daily. 90 capsule 3  . oxyCODONE-acetaminophen (PERCOCET/ROXICET) 5-325 MG tablet Take 1 tablet by mouth every 6 (six) hours as needed for severe pain. 30 tablet 0  . prochlorperazine (COMPAZINE) 10 MG tablet Take 1 tablet (10 mg total) by mouth every 6 (six) hours as needed for nausea or vomiting. 30 tablet 0   No current facility-administered medications for this visit.    Facility-Administered Medications Ordered in Other Visits  Medication Dose Route Frequency Provider Last Rate Last Dose  . sodium chloride flush (NS) 0.9 % injection 10 mL  10 mL Intracatheter PRN MCurt Bears MD   10 mL at 06/22/17 1747  . sodium chloride flush (NS) 0.9 % injection 10 mL  10 mL Intracatheter PRN Curt Bears, MD   10 mL at 12/21/17 1600    SURGICAL HISTORY:  Past Surgical History:  Procedure Laterality Date  . BREAST BIOPSY     several. Denies Hx of breast cancer.  . IR FLUORO GUIDE PORT INSERTION RIGHT  02/04/2017  . IR US GUIDE VASC ACCESS RIGHT  02/04/2017  . THORACOTOMY/LOBECTOMY Right 2008  . TONSILLECTOMY  1960    REVIEW OF SYSTEMS:   Review of Systems   Constitutional: Negative for appetite change, chills, fatigue, fever and unexpected weight change.  HENT:   Negative for mouth sores, nosebleeds, sore throat and trouble swallowing.   Eyes: Negative for eye problems and icterus.  Respiratory: Negative for cough, hemoptysis, shortness of breath and wheezing.   Cardiovascular: Negative for chest pain and leg swelling.  Gastrointestinal: Negative for abdominal pain, constipation, diarrhea, nausea and vomiting.  Genitourinary: Negative for bladder incontinence, difficulty urinating, dysuria, frequency and hematuria.   Musculoskeletal: Negative for back pain, gait problem, neck pain and neck stiffness.  Skin: Rash to her right chest wall.  Neurological: Negative for dizziness, extremity weakness, gait problem, headaches, light-headedness and seizures.  Hematological: Negative for adenopathy. Does not bruise/bleed easily.  Psychiatric/Behavioral: Negative for confusion, depression and sleep disturbance. The patient is not nervous/anxious.     PHYSICAL EXAMINATION:  Blood pressure 127/73, pulse 82, temperature 97.9 F (36.6 C), temperature source Oral, resp. rate 18, height _0  (1.575 m), weight 148 lb 6.4 oz (67.3 kg), SpO2 93 %.  ECOG PERFORMANCE STATUS: 1 - Symptomatic but completely ambulatory  Physical Exam  Constitutional: Oriented to person, place, and time and well-developed, well-nourished, and in no distress. No distress.  HENT:  Head: Normocephalic and atraumatic.  Mouth/Throat: Oropharynx is clear and moist. No oropharyngeal exudate.  Eyes: Conjunctivae are normal. Right eye exhibits no discharge. Left eye exhibits no discharge. No scleral icterus.  Neck: Normal range of motion. Neck supple.  Cardiovascular: Normal rate, regular rhythm, normal heart sounds and intact distal pulses.   Pulmonary/Chest: Effort normal and breath sounds normal. No respiratory distress. No wheezes. No rales.  Abdominal: Soft. Bowel sounds are normal.  Exhibits no distension and no mass. There is no tenderness.  Musculoskeletal: Normal range of motion. Exhibits no edema.  Lymphadenopathy:    No cervical adenopathy.  Neurological: Alert and oriented to person, place, and time. Exhibits normal muscle tone. Gait normal. Coordination normal.  Skin: Redness noted to her right chest wall consistent with candidiasis.  Psychiatric: Mood, memory and judgment normal.  Vitals reviewed.  LABORATORY DATA: Lab Results  Component Value Date   WBC 6.7 12/21/2017   HGB 11.3 (L) 12/21/2017   HCT 34.8 12/21/2017   MCV 83.0 12/21/2017   PLT 333 12/21/2017      Chemistry      Component Value Date/Time   NA 143 12/21/2017 1203   NA 142 06/22/2017 1335   K 3.8 12/21/2017 1203   K 3.9 06/22/2017 1335   CL 106 12/21/2017 1203   CO2 29 12/21/2017 1203   CO2 26 06/22/2017 1335   BUN 11 12/21/2017 1203   BUN 13.6 06/22/2017 1335   CREATININE 0.80 12/21/2017 1203   CREATININE 0.7 06/22/2017 1335      Component Value Date/Time   CALCIUM 9.5 12/21/2017 1203   CALCIUM 9.4 06/22/2017 1335   ALKPHOS 116 12/21/2017 1203   ALKPHOS 93 06/22/2017 1335   AST 12 (L) 12/21/2017 1203   AST 12 06/22/2017 1335   ALT 11  12/21/2017 1203   ALT 15 06/22/2017 1335   BILITOT 0.5 12/21/2017 1203   BILITOT 0.25 06/22/2017 1335       RADIOGRAPHIC STUDIES:  Ct Chest W Contrast  Result Date: 12/19/2017 CLINICAL DATA:  Left-sided lung cancer with remote history of right-sided right-sided lung can lung cancer cer. EXAM: CT CHEST WITH CONTRAST TECHNIQUE: Multidetector CT imaging of the chest was performed during intravenous contrast administration. CONTRAST:  14m OMNIPAQUE IOHEXOL 300 MG/ML  SOLN COMPARISON:  09/23/2017 FINDINGS: Cardiovascular: The heart size is normal. No substantial pericardial effusion. Coronary artery calcification is evident. Prominence of the main pulmonary artery raises the question of pulmonary arterial hypertension. Right Port-A-Cath tip is  positioned in the distal SVC. Mediastinum/Nodes: Small mediastinal lymph nodes again noted. No left hilar lymphadenopathy. The esophagus has normal imaging features. There is no axillary lymphadenopathy. Lungs/Pleura: Status post right pneumonectomy with apparent on bloc resection of lateral right chest wall. Distal tracheal stent device again noted. Some collapse of the proximal end of the stent is stable in the interval. Irregular nodular opacity in the retrosternal left upper lobe measures 2.3 x 1.2 cm today, unchanged in the interval. Area of ill-defined patchy tree-in-bud opacity is identified in the central left lung (60:8), new in the interval. Similar changes are noted at the left lung base. Posterior subpleural nodule measured previously at 8 mm is 9 mm today (92:8). Upper Abdomen: Unremarkable. Musculoskeletal: No worrisome lytic or sclerotic osseous abnormality. Superior endplate compression deformity of the T3 vertebral body is stable in the interval. IMPRESSION: 1. Status post right pneumonectomy. 2. Stable irregular anterior left lung nodule with adjacent radiation scarring. 3. Subpleural posterior left lower lobe pulmonary nodule stable to minimally larger today. Close continued attention required as metastatic disease not excluded. Electronically Signed   By: EMisty StanleyM.D.   On: 12/19/2017 15:53     ASSESSMENT/PLAN:  Adenocarcinoma of left lung, stage 2 ([place] This is a very pleasant 62year old white female recently diagnosed with unresectable a stage IIB non-small cell lung cancer, adenocarcinoma. The patient underwent a course of concurrent chemoradiation with weekly carboplatin and paclitaxel status post 6 cycles. She tolerated the treatment well and had partial response. She is currently undergoing consolidation treatment with immunotherapy with Imfinzi (Durvalumab) status post 18cycles. The patient continues to tolerate this treatment well.  She had a restaging CT scan of the  chest and is here to discuss the results.  The patient was seen with Dr. MJulien Nordmann  CT scan results were discussed with the patient which showed no evidence of disease progression.  Recommend for her to proceed with cycle #19 of Infinzi today as scheduled. She will follow-up in 2 weeks for evaluation prior to cycle #20.  For her right chest candidiasis, I have recommend that she try to keep this area as dry as possible.  She was given a prescription for nystatin cream to use twice a day on this area.  For her neuropathic pain to her right chest.  She was advised to call immediately if she has any concerning symptoms in the interval. The patient voices understanding of current disease status and treatment options and is in agreement with the current care plan. All questions were answered. The patient knows to call the clinic with any problems, questions or concerns. We can certainly see the patient much sooner if necessary.   No orders of the defined types were placed in this encounter.  KMikey Bussing DNP, AGPCNP-BC, AOCNP 12/21/17  ADDENDUM: Hematology/Oncology  Attending: I had a face-to-face encounter with the patient.  I recommended her care plan.  This is a very pleasant 62 years old white female with unresectable stage IIb non-small cell lung cancer status post a course of concurrent chemoradiation and she is currently undergoing consolidation treatment with immunotherapy with Imfinzi (Durvalumab) status post 18 cycles.  The patient has been tolerating her treatment with Imfinzi (Durvalumab) fairly well with no concerning complaints. She had repeat CT scan of the chest performed recently.  I personally and independently reviewed the scans and discussed the results with the patient today. Her scan showed no concerning evidence for disease progression.  I recommended for the patient to continue her current treatment with Imfinzi (Durvalumab) and she will proceed with cycle #19 today. The  patient was also given prescription for nystatin for the questionable chest wall candidiasis. She will come back for follow-up visit in 2 weeks for evaluation before the next cycle of her treatment. She was advised to call immediately if she has any concerning symptoms in the interval.  Disclaimer: This note was dictated with voice recognition software. Similar sounding words can inadvertently be transcribed and may be missed upon review. Eilleen Kempf, MD 12/25/17

## 2018-01-02 DIAGNOSIS — R6889 Other general symptoms and signs: Secondary | ICD-10-CM | POA: Diagnosis not present

## 2018-01-02 DIAGNOSIS — Z1231 Encounter for screening mammogram for malignant neoplasm of breast: Secondary | ICD-10-CM | POA: Diagnosis not present

## 2018-01-02 LAB — HM MAMMOGRAPHY

## 2018-01-04 ENCOUNTER — Inpatient Hospital Stay (HOSPITAL_BASED_OUTPATIENT_CLINIC_OR_DEPARTMENT_OTHER): Payer: Medicare Other | Admitting: Oncology

## 2018-01-04 ENCOUNTER — Inpatient Hospital Stay: Payer: Medicare Other

## 2018-01-04 ENCOUNTER — Encounter: Payer: Self-pay | Admitting: Oncology

## 2018-01-04 ENCOUNTER — Telehealth: Payer: Self-pay | Admitting: Oncology

## 2018-01-04 VITALS — BP 134/66 | HR 69 | Temp 99.1°F | Resp 17 | Ht 62.0 in | Wt 151.0 lb

## 2018-01-04 DIAGNOSIS — I10 Essential (primary) hypertension: Secondary | ICD-10-CM | POA: Diagnosis not present

## 2018-01-04 DIAGNOSIS — R21 Rash and other nonspecific skin eruption: Secondary | ICD-10-CM | POA: Diagnosis not present

## 2018-01-04 DIAGNOSIS — J449 Chronic obstructive pulmonary disease, unspecified: Secondary | ICD-10-CM

## 2018-01-04 DIAGNOSIS — R5382 Chronic fatigue, unspecified: Secondary | ICD-10-CM

## 2018-01-04 DIAGNOSIS — Z9221 Personal history of antineoplastic chemotherapy: Secondary | ICD-10-CM | POA: Diagnosis not present

## 2018-01-04 DIAGNOSIS — Z7982 Long term (current) use of aspirin: Secondary | ICD-10-CM | POA: Diagnosis not present

## 2018-01-04 DIAGNOSIS — Z5112 Encounter for antineoplastic immunotherapy: Secondary | ICD-10-CM | POA: Diagnosis not present

## 2018-01-04 DIAGNOSIS — C3492 Malignant neoplasm of unspecified part of left bronchus or lung: Secondary | ICD-10-CM

## 2018-01-04 DIAGNOSIS — Z79899 Other long term (current) drug therapy: Secondary | ICD-10-CM

## 2018-01-04 DIAGNOSIS — Z923 Personal history of irradiation: Secondary | ICD-10-CM | POA: Diagnosis not present

## 2018-01-04 DIAGNOSIS — C3412 Malignant neoplasm of upper lobe, left bronchus or lung: Secondary | ICD-10-CM | POA: Diagnosis not present

## 2018-01-04 DIAGNOSIS — E785 Hyperlipidemia, unspecified: Secondary | ICD-10-CM

## 2018-01-04 DIAGNOSIS — Z95828 Presence of other vascular implants and grafts: Secondary | ICD-10-CM

## 2018-01-04 DIAGNOSIS — Z8673 Personal history of transient ischemic attack (TIA), and cerebral infarction without residual deficits: Secondary | ICD-10-CM | POA: Diagnosis not present

## 2018-01-04 DIAGNOSIS — C3411 Malignant neoplasm of upper lobe, right bronchus or lung: Secondary | ICD-10-CM

## 2018-01-04 LAB — CMP (CANCER CENTER ONLY)
ALBUMIN: 3.5 g/dL (ref 3.5–5.0)
ALT: 12 U/L (ref 0–44)
AST: 13 U/L — AB (ref 15–41)
Alkaline Phosphatase: 103 U/L (ref 38–126)
Anion gap: 6 (ref 5–15)
BUN: 13 mg/dL (ref 6–20)
CHLORIDE: 105 mmol/L (ref 98–111)
CO2: 29 mmol/L (ref 22–32)
CREATININE: 0.75 mg/dL (ref 0.44–1.00)
Calcium: 8.9 mg/dL (ref 8.9–10.3)
GFR, Est AFR Am: >60 mL/min (ref >60)
GFR, Estimated: >60 mL/min (ref >60)
GLUCOSE: 100 mg/dL — AB (ref 70–99)
Potassium: 3.9 mmol/L (ref 3.5–5.1)
SODIUM: 140 mmol/L (ref 135–145)
Total Bilirubin: 0.3 mg/dL (ref 0.3–1.2)
Total Protein: 6.8 g/dL (ref 6.5–8.1)

## 2018-01-04 LAB — CBC WITH DIFFERENTIAL (CANCER CENTER ONLY)
BASOS ABS: 0.1 10*3/uL (ref 0.0–0.1)
BASOS PCT: 1 %
EOS ABS: 0.1 10*3/uL (ref 0.0–0.5)
EOS PCT: 1 %
HCT: 32.3 % — ABNORMAL LOW (ref 34.8–46.6)
HEMOGLOBIN: 10.5 g/dL — AB (ref 11.6–15.9)
LYMPHS PCT: 12 %
Lymphs Abs: 0.7 10*3/uL — ABNORMAL LOW (ref 0.9–3.3)
MCH: 26.9 pg (ref 25.1–34.0)
MCHC: 32.5 g/dL (ref 31.5–36.0)
MCV: 82.7 fL (ref 79.5–101.0)
Monocytes Absolute: 0.5 10*3/uL (ref 0.1–0.9)
Monocytes Relative: 8 %
NEUTROS ABS: 4.8 10*3/uL (ref 1.5–6.5)
Neutrophils Relative %: 78 %
PLATELETS: 329 10*3/uL (ref 145–400)
RBC: 3.91 MIL/uL (ref 3.70–5.45)
RDW: 14.2 % (ref 11.2–14.5)
WBC Count: 6.2 10*3/uL (ref 3.9–10.3)

## 2018-01-04 LAB — TSH: TSH: 2.083 u[IU]/mL (ref 0.308–3.960)

## 2018-01-04 MED ORDER — SODIUM CHLORIDE 0.9% FLUSH
10.0000 mL | Freq: Once | INTRAVENOUS | Status: AC
  Administered 2018-01-04: 10 mL
  Filled 2018-01-04: qty 10

## 2018-01-04 MED ORDER — OXYCODONE-ACETAMINOPHEN 5-325 MG PO TABS: 1.0000 | ORAL_TABLET | Freq: Four times a day (QID) | ORAL | 0 refills | Status: DC | PRN

## 2018-01-04 MED ORDER — SODIUM CHLORIDE 0.9 % IV SOLN
Freq: Once | INTRAVENOUS | Status: AC
Start: 2018-01-04 — End: 2018-01-04
  Administered 2018-01-04: 13:00:00 via INTRAVENOUS

## 2018-01-04 MED ORDER — SODIUM CHLORIDE 0.9% FLUSH
10.0000 mL | INTRAVENOUS | Status: DC | PRN
  Administered 2018-01-04: 10 mL
  Filled 2018-01-04: qty 10

## 2018-01-04 MED ORDER — HEPARIN SOD (PORK) LOCK FLUSH 100 UNIT/ML IV SOLN
500.0000 [IU] | Freq: Once | INTRAVENOUS | Status: AC | PRN
  Administered 2018-01-04: 500 [IU]
  Filled 2018-01-04: qty 5

## 2018-01-04 MED ORDER — SODIUM CHLORIDE 0.9 % IV SOLN
9.2000 mg/kg | Freq: Once | INTRAVENOUS | Status: AC
  Administered 2018-01-04: 620 mg via INTRAVENOUS
  Filled 2018-01-04: qty 10

## 2018-01-04 NOTE — Patient Instructions (Signed)
[facility] Discharge Instructions for Patients Receiving Chemotherapy  Today you received the following chemotherapy agents: Imfinzi.  To help prevent nausea and vomiting after your treatment, we encourage you to take your nausea medication as directed.   If you develop nausea and vomiting that is not controlled by your nausea medication, call the clinic.   BELOW ARE SYMPTOMS THAT SHOULD BE REPORTED IMMEDIATELY:  *FEVER GREATER THAN 100.5 F  *CHILLS WITH OR WITHOUT FEVER  NAUSEA AND VOMITING THAT IS NOT CONTROLLED WITH YOUR NAUSEA MEDICATION  *UNUSUAL SHORTNESS OF BREATH  *UNUSUAL BRUISING OR BLEEDING  TENDERNESS IN MOUTH AND THROAT WITH OR WITHOUT PRESENCE OF ULCERS  *URINARY PROBLEMS  *BOWEL PROBLEMS  UNUSUAL RASH Items with * indicate a potential emergency and should be followed up as soon as possible.  Feel free to call the clinic should you have any questions or concerns. The clinic phone number is [phone].  Please show the CHEMO ALERT CARD at check-in to the Emergency Department and triage nurse.   

## 2018-01-04 NOTE — Assessment & Plan Note (Addendum)
This is a very pleasant 62 year old white female recently diagnosed with unresectable a stage IIB non-small cell lung cancer, adenocarcinoma. The patient underwent a course of concurrent chemoradiation with weekly carboplatin and paclitaxel status post 6 cycles. She tolerated the treatment well and had partial response. She is currently undergoing consolidation treatment with immunotherapy with Imfinzi (Durvalumab) status post 19cycles. The patient continues to tolerate this treatment well.  Recommend for her to proceed with cycle 20 of her treatment today as scheduled. She will follow-up in 2 weeks for evaluation prior to cycle #21.  For her right chest neuropathic pain, she will continue Percocet on as-needed basis.  A refill was provided to her today.  She was advised to call immediately if she has any concerning symptoms in the interval. The patient voices understanding of current disease status and treatment options and is in agreement with the current care plan. All questions were answered. The patient knows to call the clinic with any problems, questions or concerns. We can certainly see the patient much sooner if necessary.

## 2018-01-04 NOTE — Telephone Encounter (Signed)
Gave patient avs and calendar of upcoming appts.  °

## 2018-01-04 NOTE — Progress Notes (Signed)
[place] OFFICE PROGRESS NOTE  [name], [name], MD [place] [address]  DIAGNOSIS: Stage IIB(T2b, N1, M0) non-small cell lung cancer, adenocarcinoma presented with large left upper lobe lung mass in addition to left hilar lymphadenopathy diagnosed in April 2018. Her previous workup was done at [facility] in [facility]. There was insufficient material to perform the molecular studies.  GUARDANT 360 Molecular studies:BRCA2 N340f. Negative for EGFR, ALK, ROS1, BRAF mutations.  PRIOR THERAPY:A course of concurrent chemoradiation with weekly carboplatin for AUC of 2 and paclitaxel 45 MG/M2. First dose 01/24/2017. Status post 6 cycles.  CURRENT THERAPY: Consolidation immunotherapy with Imfinzi (Durvalumab) 10 mg/KG every 2 weeks status post 19cycles.  INTERVAL HISTORY: CKhandi Kernes89y.o. female returns for routine follow-up visit by herself.  The patient is feeling fine today and has no specific complaints except for ongoing pain to her right chest which is the site of her previous surgery.  She uses Percocet at night on an as-needed basis.  The patient denies fevers and chills.  Denies chest pain, shortness breath, cough, hemoptysis.  Denies nausea, vomiting, constipation, diarrhea.  Denies recent weight loss or night sweats.  The patient is here for evaluation prior to starting cycle #20 of her Imfinzi.  MEDICAL HISTORY: Past Medical History:  Diagnosis Date  . Adenocarcinoma of left lung, stage 2 ([facility] 12/27/2016  . Cancer ([facility]    Lun cancer: Right Dx 2008, s/p lobectomy. Left Dx 09/2016  . COPD (chronic obstructive pulmonary disease) ([place]   . Encounter for antineoplastic chemotherapy 12/27/2016  . History of radiation therapy 01/24/17-03/08/17   left lung 2 Gy in 30 fractions  . Hyperlipidemia   . Hypertension   . Stroke ([facility]     ALLERGIES:  has No Known Allergies.  MEDICATIONS:  Current Outpatient Medications   Medication Sig Dispense Refill  . aspirin EC 81 MG tablet Take 81 mg by mouth daily.    .Marland Kitchenatorvastatin (LIPITOR) 40 MG tablet Take 1 tablet (40 mg total) by mouth daily. 90 tablet 3  . fluticasone (FLONASE) 50 MCG/ACT nasal spray Place 1 spray into both nostrils 2 (two) times daily. 16 g 6  . fluticasone furoate-vilanterol (BREO ELLIPTA) 200-25 MCG/INH AEPB Inhale 1 puff into the lungs daily. 90 each 1  . levothyroxine (SYNTHROID, LEVOTHROID) 75 MCG tablet Take 1 tablet (75 mcg total) by mouth daily before breakfast. 90 tablet 3  . nystatin cream (MYCOSTATIN) Apply 1 application topically 2 (two) times daily. 30 g 0  . omeprazole (PRILOSEC) 20 MG capsule Take 1 capsule (20 mg total) by mouth daily. 90 capsule 3  . oxyCODONE-acetaminophen (PERCOCET/ROXICET) 5-325 MG tablet Take 1 tablet by mouth every 6 (six) hours as needed for severe pain. 30 tablet 0  . prochlorperazine (COMPAZINE) 10 MG tablet Take 1 tablet (10 mg total) by mouth every 6 (six) hours as needed for nausea or vomiting. 30 tablet 0   No current facility-administered medications for this visit.    Facility-Administered Medications Ordered in Other Visits  Medication Dose Route Frequency Provider Last Rate Last Dose  . 0.9 %  sodium chloride infusion   Intravenous Once MCurt Bears MD      . durvalumab (IMFINZI) 620 mg in sodium chloride 0.9 % 100 mL chemo infusion  9.2 mg/kg (Treatment Plan Recorded) Intravenous Once MCurt Bears MD      . heparin lock flush 100 unit/mL  500 Units Intracatheter Once PRN MCurt Bears MD      .  sodium chloride flush (NS) 0.9 % injection 10 mL  10 mL Intracatheter PRN Curt Bears, MD   10 mL at 06/22/17 1747  . sodium chloride flush (NS) 0.9 % injection 10 mL  10 mL Intracatheter PRN Curt Bears, MD        SURGICAL HISTORY:  Past Surgical History:  Procedure Laterality Date  . BREAST BIOPSY     several. Denies Hx of breast cancer.  . IR FLUORO GUIDE PORT INSERTION  RIGHT  02/04/2017  . IR US GUIDE VASC ACCESS RIGHT  02/04/2017  . THORACOTOMY/LOBECTOMY Right 2008  . TONSILLECTOMY  1960    REVIEW OF SYSTEMS:   Review of Systems  Constitutional: Negative for appetite change, chills, fatigue, fever and unexpected weight change.  HENT:   Negative for mouth sores, nosebleeds, sore throat and trouble swallowing.   Eyes: Negative for eye problems and icterus.  Respiratory: Negative for cough, hemoptysis, shortness of breath and wheezing.   Cardiovascular: Negative leg swelling. Positive for right-sided chest pain which is unchanged. Gastrointestinal: Negative for abdominal pain, constipation, diarrhea, nausea and vomiting.  Genitourinary: Negative for bladder incontinence, difficulty urinating, dysuria, frequency and hematuria.   Musculoskeletal: Negative for back pain, gait problem, neck pain and neck stiffness.  Skin: Negative for itching and rash.  Neurological: Negative for dizziness, extremity weakness, gait problem, headaches, light-headedness and seizures.  Hematological: Negative for adenopathy. Does not bruise/bleed easily.  Psychiatric/Behavioral: Negative for confusion, depression and sleep disturbance. The patient is not nervous/anxious.     PHYSICAL EXAMINATION:  Blood pressure 134/66, pulse 69, temperature 99.1 F (37.3 C), temperature source Oral, resp. rate 17, height '5\' 2"'$  (1.575 m), weight 151 lb (68.5 kg), SpO2 94 %.  ECOG PERFORMANCE STATUS: 1 - Symptomatic but completely ambulatory  Physical Exam  Constitutional: Oriented to person, place, and time and well-developed, well-nourished, and in no distress. No distress.  HENT:  Head: Normocephalic and atraumatic.  Mouth/Throat: Oropharynx is clear and moist. No oropharyngeal exudate.  Eyes: Conjunctivae are normal. Right eye exhibits no discharge. Left eye exhibits no discharge. No scleral icterus.  Neck: Normal range of motion. Neck supple.  Cardiovascular: Normal rate, regular  rhythm, normal heart sounds and intact distal pulses.   Pulmonary/Chest: Effort normal and breath sounds normal. No respiratory distress. No wheezes. No rales.  Abdominal: Soft. Bowel sounds are normal. Exhibits no distension and no mass. There is no tenderness.  Musculoskeletal: Normal range of motion. Exhibits no edema.  Lymphadenopathy:    No cervical adenopathy.  Neurological: Alert and oriented to person, place, and time. Exhibits normal muscle tone. Gait normal. Coordination normal.  Skin: Skin is warm and dry. No rash noted. Not diaphoretic. No erythema. No pallor.  Psychiatric: Mood, memory and judgment normal.  Vitals reviewed.  LABORATORY DATA: Lab Results  Component Value Date   WBC 6.2 01/04/2018   HGB 10.5 (L) 01/04/2018   HCT 32.3 (L) 01/04/2018   MCV 82.7 01/04/2018   PLT 329 01/04/2018      Chemistry      Component Value Date/Time   NA 140 01/04/2018 1037   NA 142 06/22/2017 1335   K 3.9 01/04/2018 1037   K 3.9 06/22/2017 1335   CL 105 01/04/2018 1037   CO2 29 01/04/2018 1037   CO2 26 06/22/2017 1335   BUN 13 01/04/2018 1037   BUN 13.6 06/22/2017 1335   CREATININE 0.75 01/04/2018 1037   CREATININE 0.7 06/22/2017 1335      Component Value Date/Time   CALCIUM  8.9 01/04/2018 1037   CALCIUM 9.4 06/22/2017 1335   ALKPHOS 103 01/04/2018 1037   ALKPHOS 93 06/22/2017 1335   AST 13 (L) 01/04/2018 1037   AST 12 06/22/2017 1335   ALT 12 01/04/2018 1037   ALT 15 06/22/2017 1335   BILITOT 0.3 01/04/2018 1037   BILITOT 0.25 06/22/2017 1335       RADIOGRAPHIC STUDIES:  Ct Chest W Contrast  Result Date: 12/19/2017 CLINICAL DATA:  Left-sided lung cancer with remote history of right-sided right-sided lung can lung cancer cer. EXAM: CT CHEST WITH CONTRAST TECHNIQUE: Multidetector CT imaging of the chest was performed during intravenous contrast administration. CONTRAST:  13m OMNIPAQUE IOHEXOL 300 MG/ML  SOLN COMPARISON:  09/23/2017 FINDINGS: Cardiovascular: The  heart size is normal. No substantial pericardial effusion. Coronary artery calcification is evident. Prominence of the main pulmonary artery raises the question of pulmonary arterial hypertension. Right Port-A-Cath tip is positioned in the distal SVC. Mediastinum/Nodes: Small mediastinal lymph nodes again noted. No left hilar lymphadenopathy. The esophagus has normal imaging features. There is no axillary lymphadenopathy. Lungs/Pleura: Status post right pneumonectomy with apparent on bloc resection of lateral right chest wall. Distal tracheal stent device again noted. Some collapse of the proximal end of the stent is stable in the interval. Irregular nodular opacity in the retrosternal left upper lobe measures 2.3 x 1.2 cm today, unchanged in the interval. Area of ill-defined patchy tree-in-bud opacity is identified in the central left lung (60:8), new in the interval. Similar changes are noted at the left lung base. Posterior subpleural nodule measured previously at 8 mm is 9 mm today (92:8). Upper Abdomen: Unremarkable. Musculoskeletal: No worrisome lytic or sclerotic osseous abnormality. Superior endplate compression deformity of the T3 vertebral body is stable in the interval. IMPRESSION: 1. Status post right pneumonectomy. 2. Stable irregular anterior left lung nodule with adjacent radiation scarring. 3. Subpleural posterior left lower lobe pulmonary nodule stable to minimally larger today. Close continued attention required as metastatic disease not excluded. Electronically Signed   By: EMisty StanleyM.D.   On: 12/19/2017 15:53     ASSESSMENT/PLAN:  Adenocarcinoma of left lung, stage 2 ([place] This is a very pleasant 62year old white female recently diagnosed with unresectable a stage IIB non-small cell lung cancer, adenocarcinoma. The patient underwent a course of concurrent chemoradiation with weekly carboplatin and paclitaxel status post 6 cycles. She tolerated the treatment well and had partial  response. She is currently undergoing consolidation treatment with immunotherapy with Imfinzi (Durvalumab) status post 19cycles. The patient continues to tolerate this treatment well.  Recommend for her to proceed with cycle 20 of her treatment today as scheduled. She will follow-up in 2 weeks for evaluation prior to cycle #21.  For her right chest neuropathic pain, she will continue Percocet on as-needed basis.  A refill was provided to her today.  She was advised to call immediately if she has any concerning symptoms in the interval. The patient voices understanding of current disease status and treatment options and is in agreement with the current care plan. All questions were answered. The patient knows to call the clinic with any problems, questions or concerns. We can certainly see the patient much sooner if necessary.   No orders of the defined types were placed in this encounter.  KMikey Bussing DNP, AGPCNP-BC, AOCNP 01/04/18

## 2018-01-17 ENCOUNTER — Encounter: Payer: Self-pay | Admitting: Family Medicine

## 2018-01-17 NOTE — Progress Notes (Signed)
[facility] OFF[facility] PROGRESS NOTE  [name], Betty Herrera, Herrera [place] [address]  DIAGNOSIS:Stage IIB(T2b, N1, M0) non-small cell lung cancer, adenocarcinoma presented with large left upper lobe lung mass in addition to left hilar lymphadenopathy diagnosed in April 2018. Her previous workup was done at [facility] in [facility]. There was insufficient material to perform the molecular studies.  GUARDANT 360 Molecular studies:BRCA2 N353f. Negative for EGFR, ALK, ROS1, BRAF mutations.  PRIOR THERAPY:A course of concurrent chemoradiation with weekly carboplatin for AUC of 2 and paclitaxel 45 MG/M2. First dose 01/24/2017. Status post 6 cycles.  CURRENT THERAPY:Consolidation immunotherapy with Imfinzi (Durvalumab) 10 mg/KG every 2 weeks status post 20cycles.  INTERVAL HISTORY: CSpring San65y.o. female returns for routine follow-up visit by herself.  The patient is feeling fine today and has no specific complaints except for noticing increased fatigue over the past week.  She has not been as active as usual due to the heat and humidity.  She denies fevers and chills.  Denies chest pain, shortness of breath, cough, hemoptysis.  Denies nausea, vomiting, constipation, diarrhea.  Denies recent weight loss or night sweats.  The patient is here for evaluation prior to cycle #21 of her Imfinzi.  MEDICAL HISTORY: Past Medical History:  Diagnosis Date  . Aden[place] of [place] lung, stage 2 ([place] 12/27/2016  . Cancer ([facility]    Lun cancer: Right Dx 2008, s/p lobectomy. Left Dx 09/2016  . COPD (chronic obstructive pulmonary disease) ([facility]   . Encounter for antineoplastic chemotherapy 12/27/2016  . History of radiation therapy 01/24/17-03/08/17   left lung 2 Gy in 30 fractions  . Hyperlipidemia   . Hypertension   . Stroke ([facility]     ALLERGIES:  has No Known Allergies.  MEDICATIONS:  Current Outpatient Medications  Medication Sig Dispense  Refill  . aspirin EC 81 MG tablet Take 81 mg by mouth daily.    .Marland Kitchenatorvastatin (LIPITOR) 40 MG tablet Take 1 tablet (40 mg total) by mouth daily. 90 tablet 3  . fluticasone (FLONASE) 50 MCG/ACT nasal spray Place 1 spray into both nostrils 2 (two) times daily. 16 Herrera 6  . fluticasone furoate-vilanterol (BREO ELLIPTA) 200-25 MCG/INH AEPB Inhale 1 puff into the lungs daily. 90 each 1  . levothyroxine (SYNTHROID, LEVOTHROID) 75 MCG tablet Take 1 tablet (75 mcg total) by mouth daily before breakfast. 90 tablet 3  . omeprazole (PRILOSEC) 20 MG capsule Take 1 capsule (20 mg total) by mouth daily. 90 capsule 3  . oxyCODONE-acetaminophen (PERCOCET/ROXICET) 5-325 MG tablet Take 1 tablet by mouth every 6 (six) hours as needed for severe pain. 30 tablet 0  . nystatin cream (MYCOSTATIN) Apply 1 application topically 2 (two) times daily. (Patient not taking: Reported on 01/18/2018) 30 Herrera 0  . prochlorperazine (COMPAZINE) 10 MG tablet Take 1 tablet (10 mg total) by mouth every 6 (six) hours as needed for nausea or vomiting. (Patient not taking: Reported on 01/18/2018) 30 tablet 0   No current facility-administered medications for this visit.    Facility-Administered Medications Ordered in Other Visits  Medication Dose Route Frequency Provider Last Rate Last Dose  . durvalumab (IMFINZI) 620 mg in sodium chloride 0.9 % 100 mL chemo infusion  9.2 mg/kg (Treatment Plan Recorded) Intravenous Once Felicia Herrera      . heparin lock flush 100 unit/mL  500 Units Intracatheter Once PRN Felicia Herrera      . sodium chloride flush (NS) 0.9 % injection  10 mL  10 mL Intracatheter PRN Felicia Herrera   10 mL at 06/22/17 1747  . sodium chloride flush (NS) 0.9 % injection 10 mL  10 mL Intracatheter PRN Felicia Herrera        SURGICAL HISTORY:  Past Surgical History:  Procedure Laterality Date  . BREAST BIOPSY     several. Denies Hx of breast cancer.  . IR FLUORO GUIDE PORT INSERTION RIGHT  02/04/2017  .  IR US GUIDE VASC ACCESS RIGHT  02/04/2017  . THORACOTOMY/LOBECTOMY Right 2008  . TONSILLECTOMY  1960    REVIEW OF SYSTEMS:   Review of Systems  Constitutional: Negative for appetite change, chills, fever and unexpected weight change. Positive for fatigue. HENT:   Negative for mouth sores, nosebleeds, sore throat and trouble swallowing.   Eyes: Negative for eye problems and icterus.  Respiratory: Negative for cough, hemoptysis, shortness of breath and wheezing.   Cardiovascular: Negative for chest pain and leg swelling.  Gastrointestinal: Negative for abdominal pain, constipation, diarrhea, nausea and vomiting.  Genitourinary: Negative for bladder incontinence, difficulty urinating, dysuria, frequency and hematuria.   Musculoskeletal: Negative for back pain, gait problem, neck pain and neck stiffness.  Skin: Negative for itching and rash.  Neurological: Negative for dizziness, extremity weakness, gait problem, headaches, light-headedness and seizures. Positive for ongoing neuropathic pain to her right chest.  She uses Percocet on an as-needed basis. Hematological: Negative for adenopathy. Does not bruise/bleed easily.  Psychiatric/Behavioral: Negative for confusion, depression and sleep disturbance. The patient is not nervous/anxious.     PHYSICAL EXAMINATION:  Blood pressure 135/74, pulse 87, temperature 98.3 F (36.8 C), temperature source Oral, resp. rate 19, height '5\' 2"'$  (1.575 m), weight 150 lb 8 oz (68.3 kg), SpO2 97 %.  ECOG PERFORMANCE STATUS: 1 - Symptomatic but completely ambulatory  Physical Exam  Constitutional: Oriented to person, place, and time and well-developed, well-nourished, and in no distress. No distress.  HENT:  Head: Normocephalic and atraumatic.  Mouth/Throat: Oropharynx is clear and moist. No oropharyngeal exudate.  Eyes: Conjunctivae are normal. Right eye exhibits no discharge. Left eye exhibits no discharge. No scleral icterus.  Neck: Normal range of motion.  Neck supple.  Cardiovascular: Normal rate, regular rhythm, normal heart sounds and intact distal pulses.   Pulmonary/Chest: Effort normal and breath sounds normal. No respiratory distress. No wheezes. No rales.  Abdominal: Soft. Bowel sounds are normal. Exhibits no distension and no mass. There is no tenderness.  Musculoskeletal: Normal range of motion. Exhibits no edema.  Lymphadenopathy:    No cervical adenopathy.  Neurological: Alert and oriented to person, place, and time. Exhibits normal muscle tone. Gait normal. Coordination normal.  Skin: Skin is warm and dry. No rash noted. Not diaphoretic. No erythema. No pallor.  Psychiatric: Mood, memory and judgment normal.  Vitals reviewed.  LABORATORY DATA: Lab Results  Component Value Date   WBC 7.4 01/18/2018   HGB 11.3 (L) 01/18/2018   HCT 34.8 01/18/2018   MCV 82.4 01/18/2018   PLT 314 01/18/2018      Chemistry      Component Value Date/Time   NA 143 01/18/2018 0935   NA 142 06/22/2017 1335   K 3.4 (L) 01/18/2018 0935   K 3.9 06/22/2017 1335   CL 106 01/18/2018 0935   CO2 25 01/18/2018 0935   CO2 26 06/22/2017 1335   BUN 18 01/18/2018 0935   BUN 13.6 06/22/2017 1335   CREATININE 0.78 01/18/2018 0935   CREATININE 0.7 06/22/2017 1335  Component Value Date/Time   CALCIUM 9.4 01/18/2018 0935   CALCIUM 9.4 06/22/2017 1335   ALKPHOS 109 01/18/2018 0935   ALKPHOS 93 06/22/2017 1335   AST 11 (L) 01/18/2018 0935   AST 12 06/22/2017 1335   ALT 10 01/18/2018 0935   ALT 15 06/22/2017 1335   BILITOT 0.4 01/18/2018 0935   BILITOT 0.25 06/22/2017 1335       RADIOGRAPHIC STUDIES:  Ct Chest W Contrast  Result Date: 12/19/2017 CLINICAL DATA:  Left-sided lung cancer with remote history of right-sided right-sided lung can lung cancer cer. EXAM: CT CHEST WITH CONTRAST TECHNIQUE: Multidetector CT imaging of the chest was performed during intravenous contrast administration. CONTRAST:  29m OMNIPAQUE IOHEXOL 300 MG/ML  SOLN  COMPARISON:  09/23/2017 FINDINGS: Cardiovascular: The heart size is normal. No substantial pericardial effusion. Coronary artery calcification is evident. Prominence of the main pulmonary artery raises the question of pulmonary arterial hypertension. Right Port-A-Cath tip is positioned in the distal SVC. Mediastinum/Nodes: Small mediastinal lymph nodes again noted. No left hilar lymphadenopathy. The esophagus has normal imaging features. There is no axillary lymphadenopathy. Lungs/Pleura: Status post right pneumonectomy with apparent on bloc resection of lateral right chest wall. Distal tracheal stent device again noted. Some collapse of the proximal end of the stent is stable in the interval. Irregular nodular opacity in the retrosternal left upper lobe measures 2.3 x 1.2 cm today, unchanged in the interval. Area of ill-defined patchy tree-in-bud opacity is identified in the central left lung (60:8), new in the interval. Similar changes are noted at the left lung base. Posterior subpleural nodule measured previously at 8 mm is 9 mm today (92:8). Upper Abdomen: Unremarkable. Musculoskeletal: No worrisome lytic or sclerotic osseous abnormality. Superior endplate compression deformity of the T3 vertebral body is stable in the interval. IMPRESSION: 1. Status post right pneumonectomy. 2. Stable irregular anterior left lung nodule with adjacent radiation scarring. 3. Subpleural posterior left lower lobe pulmonary nodule stable to minimally larger today. Close continued attention required as metastatic disease not excluded. Electronically Signed   By: EMisty StanleyM.D.   On: 12/19/2017 15:53     ASSESSMENT/PLAN:  Adenocarcinoma of left lung, stage 2 ([place] This is a very pleasant 62year old white female recently diagnosed with unresectable a stage IIB non-small cell lung cancer, adenocarcinoma. The patient underwent a course of concurrent chemoradiation with weekly carboplatin and paclitaxel status post 6 cycles.  She tolerated the treatment well and had partial response. She is currently undergoing consolidation treatment with immunotherapy with Imfinzi (Durvalumab) status post 20cycles. The patient continues to tolerate this treatment well.  Recommend for her to proceed with cycle 21 of her treatment today as scheduled. She will follow-up in 2 weeks for evaluation prior to cycle #22.  For her right chest neuropathic pain, she will continue Percocet on as-needed basis.    For her fatigue, I have encouraged her to become more active.  Labs have been reviewed including her CBC, CMET and TSH from last visit.  All remain stable.  She was advised to call immediately if she has any concerning symptoms in the interval. The patient voices understanding of current disease status and treatment options and is in agreement with the current care plan. All questions were answered. The patient knows to call the clinic with any problems, questions or concerns. We can certainly see the patient much sooner if necessary.   No orders of the defined types were placed in this encounter.  KMikey Bussing DNP, AGPCNP-BC, AOCNP  01/18/18    

## 2018-01-17 NOTE — Assessment & Plan Note (Addendum)
This is a very pleasant 62 year old white female recently diagnosed with unresectable a stage IIB non-small cell lung cancer, adenocarcinoma. The patient underwent a course of concurrent chemoradiation with weekly carboplatin and paclitaxel status post 6 cycles. She tolerated the treatment well and had partial response. She is currently undergoing consolidation treatment with immunotherapy with Imfinzi (Durvalumab) status post 20cycles. The patient continues to tolerate this treatment well.  Recommend for her to proceed with cycle 21 of her treatment today as scheduled. She will follow-up in 2 weeks for evaluation prior to cycle #22.  For her right chest neuropathic pain, she will continue Percocet on as-needed basis.    For her fatigue, I have encouraged her to become more active.  Labs have been reviewed including her CBC, CMET and TSH from last visit.  All remain stable.  She was advised to call immediately if she has any concerning symptoms in the interval. The patient voices understanding of current disease status and treatment options and is in agreement with the current care plan. All questions were answered. The patient knows to call the clinic with any problems, questions or concerns. We can certainly see the patient much sooner if necessary.

## 2018-01-18 ENCOUNTER — Encounter: Payer: Self-pay | Admitting: Oncology

## 2018-01-18 ENCOUNTER — Inpatient Hospital Stay: Payer: Medicare Other

## 2018-01-18 ENCOUNTER — Other Ambulatory Visit: Payer: Self-pay

## 2018-01-18 ENCOUNTER — Inpatient Hospital Stay (HOSPITAL_BASED_OUTPATIENT_CLINIC_OR_DEPARTMENT_OTHER): Payer: Medicare Other | Admitting: Oncology

## 2018-01-18 ENCOUNTER — Telehealth: Payer: Self-pay | Admitting: Oncology

## 2018-01-18 VITALS — BP 135/74 | HR 87 | Temp 98.3°F | Resp 19 | Ht 62.0 in | Wt 150.5 lb

## 2018-01-18 DIAGNOSIS — Z8673 Personal history of transient ischemic attack (TIA), and cerebral infarction without residual deficits: Secondary | ICD-10-CM

## 2018-01-18 DIAGNOSIS — C3492 Malignant neoplasm of unspecified part of left bronchus or lung: Secondary | ICD-10-CM

## 2018-01-18 DIAGNOSIS — Z9221 Personal history of antineoplastic chemotherapy: Secondary | ICD-10-CM | POA: Diagnosis not present

## 2018-01-18 DIAGNOSIS — Z5112 Encounter for antineoplastic immunotherapy: Secondary | ICD-10-CM

## 2018-01-18 DIAGNOSIS — I10 Essential (primary) hypertension: Secondary | ICD-10-CM | POA: Diagnosis not present

## 2018-01-18 DIAGNOSIS — E785 Hyperlipidemia, unspecified: Secondary | ICD-10-CM

## 2018-01-18 DIAGNOSIS — Z95828 Presence of other vascular implants and grafts: Secondary | ICD-10-CM

## 2018-01-18 DIAGNOSIS — Z79899 Other long term (current) drug therapy: Secondary | ICD-10-CM | POA: Diagnosis not present

## 2018-01-18 DIAGNOSIS — J449 Chronic obstructive pulmonary disease, unspecified: Secondary | ICD-10-CM

## 2018-01-18 DIAGNOSIS — C3411 Malignant neoplasm of upper lobe, right bronchus or lung: Secondary | ICD-10-CM

## 2018-01-18 DIAGNOSIS — Z923 Personal history of irradiation: Secondary | ICD-10-CM

## 2018-01-18 DIAGNOSIS — C3412 Malignant neoplasm of upper lobe, left bronchus or lung: Secondary | ICD-10-CM | POA: Diagnosis not present

## 2018-01-18 DIAGNOSIS — R21 Rash and other nonspecific skin eruption: Secondary | ICD-10-CM | POA: Diagnosis not present

## 2018-01-18 DIAGNOSIS — Z7982 Long term (current) use of aspirin: Secondary | ICD-10-CM

## 2018-01-18 LAB — CBC WITH DIFFERENTIAL (CANCER CENTER ONLY)
BASOS PCT: 0 %
Basophils Absolute: 0 10*3/uL (ref 0.0–0.1)
EOS ABS: 0.1 10*3/uL (ref 0.0–0.5)
EOS PCT: 1 %
HCT: 34.8 % (ref 34.8–46.6)
HEMOGLOBIN: 11.3 g/dL — AB (ref 11.6–15.9)
Lymphocytes Relative: 10 %
Lymphs Abs: 0.8 10*3/uL — ABNORMAL LOW (ref 0.9–3.3)
MCH: 26.8 pg (ref 25.1–34.0)
MCHC: 32.5 g/dL (ref 31.5–36.0)
MCV: 82.4 fL (ref 79.5–101.0)
MONOS PCT: 7 %
Monocytes Absolute: 0.5 10*3/uL (ref 0.1–0.9)
NEUTROS PCT: 82 %
Neutro Abs: 6.1 10*3/uL (ref 1.5–6.5)
PLATELETS: 314 10*3/uL (ref 145–400)
RBC: 4.22 MIL/uL (ref 3.70–5.45)
RDW: 14.5 % (ref 11.2–14.5)
WBC Count: 7.4 10*3/uL (ref 3.9–10.3)

## 2018-01-18 LAB — CMP (CANCER CENTER ONLY)
ALK PHOS: 109 U/L (ref 38–126)
ALT: 10 U/L (ref 0–44)
AST: 11 U/L — AB (ref 15–41)
Albumin: 3.6 g/dL (ref 3.5–5.0)
Anion gap: 12 (ref 5–15)
BUN: 18 mg/dL (ref 6–20)
CALCIUM: 9.4 mg/dL (ref 8.9–10.3)
CO2: 25 mmol/L (ref 22–32)
CREATININE: 0.78 mg/dL (ref 0.44–1.00)
Chloride: 106 mmol/L (ref 98–111)
GFR, Est AFR Am: >60 mL/min (ref >60)
Glucose, Bld: 117 mg/dL — ABNORMAL HIGH (ref 70–99)
Potassium: 3.4 mmol/L — ABNORMAL LOW (ref 3.5–5.1)
Sodium: 143 mmol/L (ref 135–145)
Total Bilirubin: 0.4 mg/dL (ref 0.3–1.2)
Total Protein: 7.5 g/dL (ref 6.5–8.1)

## 2018-01-18 MED ORDER — SODIUM CHLORIDE 0.9% FLUSH
10.0000 mL | Freq: Once | INTRAVENOUS | Status: AC
  Administered 2018-01-18: 10 mL
  Filled 2018-01-18: qty 10

## 2018-01-18 MED ORDER — HEPARIN SOD (PORK) LOCK FLUSH 100 UNIT/ML IV SOLN
500.0000 [IU] | Freq: Once | INTRAVENOUS | Status: AC | PRN
  Administered 2018-01-18: 500 [IU]
  Filled 2018-01-18: qty 5

## 2018-01-18 MED ORDER — ALTEPLASE 2 MG IJ SOLR
INTRAMUSCULAR | Status: AC
  Filled 2018-01-18: qty 2

## 2018-01-18 MED ORDER — ALTEPLASE 2 MG IJ SOLR
2.0000 mg | Freq: Once | INTRAMUSCULAR | Status: AC
  Administered 2018-01-18: 2 mg
  Filled 2018-01-18: qty 2

## 2018-01-18 MED ORDER — DURVALUMAB 500 MG/10ML IV SOLN
9.2000 mg/kg | Freq: Once | INTRAVENOUS | Status: AC
  Administered 2018-01-18: 620 mg via INTRAVENOUS
  Filled 2018-01-18: qty 2.4

## 2018-01-18 MED ORDER — SODIUM CHLORIDE 0.9% FLUSH
10.0000 mL | INTRAVENOUS | Status: DC | PRN
  Administered 2018-01-18: 10 mL
  Filled 2018-01-18: qty 10

## 2018-01-18 MED ORDER — SODIUM CHLORIDE 0.9 % IV SOLN
Freq: Once | INTRAVENOUS | Status: AC
  Administered 2018-01-18: 11:00:00 via INTRAVENOUS
  Filled 2018-01-18: qty 250

## 2018-01-18 NOTE — Patient Instructions (Signed)
[facility] Discharge Instructions for Patients Receiving Chemotherapy  Today you received the following chemotherapy agents: Imfinzi.  To help prevent nausea and vomiting after your treatment, we encourage you to take your nausea medication as directed.   If you develop nausea and vomiting that is not controlled by your nausea medication, call the clinic.   BELOW ARE SYMPTOMS THAT SHOULD BE REPORTED IMMEDIATELY:  *FEVER GREATER THAN 100.5 F  *CHILLS WITH OR WITHOUT FEVER  NAUSEA AND VOMITING THAT IS NOT CONTROLLED WITH YOUR NAUSEA MEDICATION  *UNUSUAL SHORTNESS OF BREATH  *UNUSUAL BRUISING OR BLEEDING  TENDERNESS IN MOUTH AND THROAT WITH OR WITHOUT PRESENCE OF ULCERS  *URINARY PROBLEMS  *BOWEL PROBLEMS  UNUSUAL RASH Items with * indicate a potential emergency and should be followed up as soon as possible.  Feel free to call the clinic should you have any questions or concerns. The clinic phone number is [phone].  Please show the CHEMO ALERT CARD at check-in to the Emergency Department and triage nurse.   

## 2018-01-18 NOTE — Telephone Encounter (Signed)
appts already scheduled per 7/31 los. - no additional appts added.

## 2018-01-31 ENCOUNTER — Telehealth: Payer: Self-pay | Admitting: Internal Medicine

## 2018-01-31 NOTE — Telephone Encounter (Signed)
Returned call to patient re message she left about being confused about her schedule. Not able to reach patient and left message asking that she call back when she can re questions about schedule.

## 2018-02-01 ENCOUNTER — Encounter: Payer: Self-pay | Admitting: Internal Medicine

## 2018-02-01 ENCOUNTER — Inpatient Hospital Stay: Payer: Medicare Other

## 2018-02-01 ENCOUNTER — Inpatient Hospital Stay: Payer: Medicare Other | Attending: Nurse Practitioner

## 2018-02-01 ENCOUNTER — Telehealth: Payer: Self-pay | Admitting: Internal Medicine

## 2018-02-01 ENCOUNTER — Inpatient Hospital Stay (HOSPITAL_BASED_OUTPATIENT_CLINIC_OR_DEPARTMENT_OTHER): Payer: Medicare Other | Admitting: Internal Medicine

## 2018-02-01 VITALS — BP 132/63 | HR 73 | Temp 98.0°F | Resp 18 | Ht 62.0 in | Wt 150.3 lb

## 2018-02-01 DIAGNOSIS — Z79899 Other long term (current) drug therapy: Secondary | ICD-10-CM | POA: Insufficient documentation

## 2018-02-01 DIAGNOSIS — I10 Essential (primary) hypertension: Secondary | ICD-10-CM

## 2018-02-01 DIAGNOSIS — Z7982 Long term (current) use of aspirin: Secondary | ICD-10-CM | POA: Insufficient documentation

## 2018-02-01 DIAGNOSIS — G629 Polyneuropathy, unspecified: Secondary | ICD-10-CM | POA: Insufficient documentation

## 2018-02-01 DIAGNOSIS — Z9221 Personal history of antineoplastic chemotherapy: Secondary | ICD-10-CM | POA: Insufficient documentation

## 2018-02-01 DIAGNOSIS — C3492 Malignant neoplasm of unspecified part of left bronchus or lung: Secondary | ICD-10-CM

## 2018-02-01 DIAGNOSIS — E785 Hyperlipidemia, unspecified: Secondary | ICD-10-CM | POA: Diagnosis not present

## 2018-02-01 DIAGNOSIS — C3412 Malignant neoplasm of upper lobe, left bronchus or lung: Secondary | ICD-10-CM

## 2018-02-01 DIAGNOSIS — J449 Chronic obstructive pulmonary disease, unspecified: Secondary | ICD-10-CM

## 2018-02-01 DIAGNOSIS — Z5112 Encounter for antineoplastic immunotherapy: Secondary | ICD-10-CM | POA: Insufficient documentation

## 2018-02-01 DIAGNOSIS — C3411 Malignant neoplasm of upper lobe, right bronchus or lung: Secondary | ICD-10-CM

## 2018-02-01 DIAGNOSIS — Z8673 Personal history of transient ischemic attack (TIA), and cerebral infarction without residual deficits: Secondary | ICD-10-CM

## 2018-02-01 DIAGNOSIS — R5382 Chronic fatigue, unspecified: Secondary | ICD-10-CM

## 2018-02-01 DIAGNOSIS — Z923 Personal history of irradiation: Secondary | ICD-10-CM | POA: Insufficient documentation

## 2018-02-01 DIAGNOSIS — Z95828 Presence of other vascular implants and grafts: Secondary | ICD-10-CM

## 2018-02-01 LAB — CMP (CANCER CENTER ONLY)
ALBUMIN: 3.6 g/dL (ref 3.5–5.0)
ALT: 13 U/L (ref 0–44)
ANION GAP: 12 (ref 5–15)
AST: 14 U/L — AB (ref 15–41)
Alkaline Phosphatase: 101 U/L (ref 38–126)
BILIRUBIN TOTAL: 0.5 mg/dL (ref 0.3–1.2)
BUN: 14 mg/dL (ref 6–20)
CHLORIDE: 106 mmol/L (ref 98–111)
CO2: 25 mmol/L (ref 22–32)
Calcium: 9 mg/dL (ref 8.9–10.3)
Creatinine: 0.77 mg/dL (ref 0.44–1.00)
GFR, Est AFR Am: >60 mL/min (ref >60)
GFR, Estimated: >60 mL/min (ref >60)
GLUCOSE: 102 mg/dL — AB (ref 70–99)
POTASSIUM: 3.7 mmol/L (ref 3.5–5.1)
SODIUM: 143 mmol/L (ref 135–145)
TOTAL PROTEIN: 7.3 g/dL (ref 6.5–8.1)

## 2018-02-01 LAB — CBC WITH DIFFERENTIAL (CANCER CENTER ONLY)
BASOS PCT: 1 %
Basophils Absolute: 0 10*3/uL (ref 0.0–0.1)
EOS PCT: 1 %
Eosinophils Absolute: 0.1 10*3/uL (ref 0.0–0.5)
HCT: 34.1 % — ABNORMAL LOW (ref 34.8–46.6)
Hemoglobin: 11 g/dL — ABNORMAL LOW (ref 11.6–15.9)
LYMPHS ABS: 0.8 10*3/uL — AB (ref 0.9–3.3)
LYMPHS PCT: 15 %
MCH: 26.9 pg (ref 25.1–34.0)
MCHC: 32.4 g/dL (ref 31.5–36.0)
MCV: 83.1 fL (ref 79.5–101.0)
MONO ABS: 0.4 10*3/uL (ref 0.1–0.9)
Monocytes Relative: 7 %
NEUTROS ABS: 3.8 10*3/uL (ref 1.5–6.5)
Neutrophils Relative %: 76 %
PLATELETS: 355 10*3/uL (ref 145–400)
RBC: 4.11 MIL/uL (ref 3.70–5.45)
RDW: 15.2 % — ABNORMAL HIGH (ref 11.2–14.5)
WBC Count: 5 10*3/uL (ref 3.9–10.3)

## 2018-02-01 LAB — TSH: TSH: 1.912 u[IU]/mL (ref 0.308–3.960)

## 2018-02-01 MED ORDER — SODIUM CHLORIDE 0.9 % IV SOLN
Freq: Once | INTRAVENOUS | Status: AC
  Administered 2018-02-01: 14:00:00 via INTRAVENOUS
  Filled 2018-02-01: qty 250

## 2018-02-01 MED ORDER — SODIUM CHLORIDE 0.9 % IV SOLN
620.0000 mg | Freq: Once | INTRAVENOUS | Status: AC
  Administered 2018-02-01: 620 mg via INTRAVENOUS
  Filled 2018-02-01: qty 2.4

## 2018-02-01 MED ORDER — SODIUM CHLORIDE 0.9% FLUSH
10.0000 mL | Freq: Once | INTRAVENOUS | Status: AC
  Administered 2018-02-01: 10 mL
  Filled 2018-02-01: qty 10

## 2018-02-01 MED ORDER — HEPARIN SOD (PORK) LOCK FLUSH 100 UNIT/ML IV SOLN
500.0000 [IU] | Freq: Once | INTRAVENOUS | Status: AC | PRN
  Administered 2018-02-01: 500 [IU]
  Filled 2018-02-01: qty 5

## 2018-02-01 MED ORDER — SODIUM CHLORIDE 0.9% FLUSH
10.0000 mL | INTRAVENOUS | Status: DC | PRN
  Administered 2018-02-01: 10 mL
  Filled 2018-02-01: qty 10

## 2018-02-01 NOTE — Telephone Encounter (Signed)
3 cycles already scheduled per 8/14 los.

## 2018-02-01 NOTE — Progress Notes (Signed)
[facility] [phone]   [phone]  OFFICE PROGRESS NOTE  Felicia, [name], MD [facility] [place] [address]  DIAGNOSIS: Stage IIB (T2b, N1, M0) non-small cell lung cancer, adenocarcinoma presented with large left upper lobe lung mass in addition to left hilar lymphadenopathy diagnosed in April 2018. Her previous workup was done at [facility] in [facility]. There was insufficient material to perform the molecular studies.  GUARDANT 360 Molecular studies: BRCA2 N363f. Negative for EGFR, ALK, ROS1, BRAF mutations.  PRIOR THERAPY:  A course of concurrent chemoradiation with weekly carboplatin for AUC of 2 and paclitaxel 45 MG/M2. First dose 01/24/2017. Status post 6 cycles.  CURRENT THERAPY: Consolidation immunotherapy with Imfinzi (Durvalumab) 10 mg/KG every 2 weeks status post 21 cycles.  INTERVAL HISTORY: Felicia Alviar89y.o. female returns to the clinic today for follow-up visit.  The patient is feeling fine today with no specific complaints.  She continues to tolerate her treatment with immunotherapy fairly well.  She denied having any chest pain, shortness breath, cough or hemoptysis.  She denied having any fever or chills.  She has no nausea, vomiting, diarrhea or constipation.  She denied having any skin rash.  She is here today for evaluation before starting cycle #22 of her treatment.   MEDICAL HISTORY: Past Medical History:  Diagnosis Date  . Adenocarcinoma of left lung, stage 2 ([facility] 12/27/2016  . Cancer ([facility]    Lun cancer: Right Dx 2008, s/p lobectomy. Left Dx 09/2016  . COPD (chronic obstructive pulmonary disease) ([facility]   . Encounter for antineoplastic chemotherapy 12/27/2016  . History of radiation therapy 01/24/17-03/08/17   left lung 2 Gy in 30 fractions  . Hyperlipidemia   . Hypertension   . Stroke ([facility]     ALLERGIES:  has No Known Allergies.  MEDICATIONS:  Current Outpatient  Medications  Medication Sig Dispense Refill  . aspirin EC 81 MG tablet Take 81 mg by mouth daily.    .Marland Kitchenatorvastatin (LIPITOR) 40 MG tablet Take 1 tablet (40 mg total) by mouth daily. 90 tablet 3  . fluticasone (FLONASE) 50 MCG/ACT nasal spray Place 1 spray into both nostrils 2 (two) times daily. 16 Herrera 6  . fluticasone furoate-vilanterol (BREO ELLIPTA) 200-25 MCG/INH AEPB Inhale 1 puff into the lungs daily. 90 each 1  . levothyroxine (SYNTHROID, LEVOTHROID) 75 MCG tablet Take 1 tablet (75 mcg total) by mouth daily before breakfast. 90 tablet 3  . nystatin cream (MYCOSTATIN) Apply 1 application topically 2 (two) times daily. (Patient not taking: Reported on 01/18/2018) 30 Herrera 0  . omeprazole (PRILOSEC) 20 MG capsule Take 1 capsule (20 mg total) by mouth daily. 90 capsule 3  . oxyCODONE-acetaminophen (PERCOCET/ROXICET) 5-325 MG tablet Take 1 tablet by mouth every 6 (six) hours as needed for severe pain. 30 tablet 0  . prochlorperazine (COMPAZINE) 10 MG tablet Take 1 tablet (10 mg total) by mouth every 6 (six) hours as needed for nausea or vomiting. (Patient not taking: Reported on 01/18/2018) 30 tablet 0   No current facility-administered medications for this visit.    Facility-Administered Medications Ordered in Other Visits  Medication Dose Route Frequency Provider Last Rate Last Dose  . sodium chloride flush (NS) 0.9 % injection 10 mL  10 mL Intracatheter PRN MCurt Bears MD   10 mL at 06/22/17 1747    SURGICAL HISTORY:  Past Surgical History:  Procedure Laterality Date  . BREAST BIOPSY  several. Denies Hx of breast cancer.  . IR FLUORO GUIDE PORT INSERTION RIGHT  02/04/2017  . IR US GUIDE VASC ACCESS RIGHT  02/04/2017  . THORACOTOMY/LOBECTOMY Right 2008  . TONSILLECTOMY  1960    REVIEW OF SYSTEMS:  A comprehensive review of systems was negative.   PHYSICAL EXAMINATION: General appearance: alert, cooperative and no distress Head: Normocephalic, without obvious abnormality,  atraumatic Neck: no adenopathy, no JVD, supple, symmetrical, trachea midline and thyroid not enlarged, symmetric, no tenderness/mass/nodules Lymph nodes: Cervical, supraclavicular, and axillary nodes normal. Resp: clear to auscultation bilaterally Back: symmetric, no curvature. ROM normal. No CVA tenderness. Cardio: regular rate and rhythm, S1, S2 normal, no murmur, click, rub or gallop GI: soft, non-tender; bowel sounds normal; no masses,  no organomegaly Extremities: extremities normal, atraumatic, no cyanosis or edema  ECOG PERFORMANCE STATUS: 1 - Symptomatic but completely ambulatory  Blood pressure 132/63, pulse 73, temperature 98 F (36.7 C), temperature source Oral, resp. rate 18, height '5\' 2"'$  (1.575 m), weight 150 lb 4.8 oz (68.2 kg), SpO2 97 %.  LABORATORY DATA: Lab Results  Component Value Date   WBC 5.0 02/01/2018   HGB 11.0 (L) 02/01/2018   HCT 34.1 (L) 02/01/2018   MCV 83.1 02/01/2018   PLT 355 02/01/2018      Chemistry      Component Value Date/Time   NA 143 01/18/2018 0935   NA 142 06/22/2017 1335   K 3.4 (L) 01/18/2018 0935   K 3.9 06/22/2017 1335   CL 106 01/18/2018 0935   CO2 25 01/18/2018 0935   CO2 26 06/22/2017 1335   BUN 18 01/18/2018 0935   BUN 13.6 06/22/2017 1335   CREATININE 0.78 01/18/2018 0935   CREATININE 0.7 06/22/2017 1335      Component Value Date/Time   CALCIUM 9.4 01/18/2018 0935   CALCIUM 9.4 06/22/2017 1335   ALKPHOS 109 01/18/2018 0935   ALKPHOS 93 06/22/2017 1335   AST 11 (L) 01/18/2018 0935   AST 12 06/22/2017 1335   ALT 10 01/18/2018 0935   ALT 15 06/22/2017 1335   BILITOT 0.4 01/18/2018 0935   BILITOT 0.25 06/22/2017 1335       RADIOGRAPHIC STUDIES: No results found.  ASSESSMENT AND PLAN: This is a very pleasant 62 years old white female recently diagnosed with unresectable a stage IIB non-small cell lung cancer, adenocarcinoma. The patient underwent a course of concurrent chemoradiation with weekly carboplatin and  paclitaxel status post 6 cycles.  She tolerated the treatment well and had partial response. She is currently undergoing consolidation treatment with immunotherapy with Imfinzi (Durvalumab) status post 21 cycles. The patient continues to tolerate this treatment well. I recommended for her to proceed with cycle #22 today as scheduled. I will see her back for follow-up visit in 2 weeks for evaluation with the start of cycle #23. She was advised to call immediately if she has any concerning symptoms in the interval. The patient voices understanding of current disease status and treatment options and is in agreement with the current care plan. All questions were answered. The patient knows to call the clinic with any problems, questions or concerns. We can certainly see the patient much sooner if necessary.  Disclaimer: This note was dictated with voice recognition software. Similar sounding words can inadvertently be transcribed and may not be corrected upon review.

## 2018-02-01 NOTE — Patient Instructions (Signed)
[facility] Discharge Instructions for Patients Receiving Chemotherapy  Today you received the following chemotherapy agents Imfinzi.  To help prevent nausea and vomiting after your treatment, we encourage you to take your nausea medication as prescribed.   If you develop nausea and vomiting that is not controlled by your nausea medication, call the clinic.   BELOW ARE SYMPTOMS THAT SHOULD BE REPORTED IMMEDIATELY:  *FEVER GREATER THAN 100.5 F  *CHILLS WITH OR WITHOUT FEVER  NAUSEA AND VOMITING THAT IS NOT CONTROLLED WITH YOUR NAUSEA MEDICATION  *UNUSUAL SHORTNESS OF BREATH  *UNUSUAL BRUISING OR BLEEDING  TENDERNESS IN MOUTH AND THROAT WITH OR WITHOUT PRESENCE OF ULCERS  *URINARY PROBLEMS  *BOWEL PROBLEMS  UNUSUAL RASH Items with * indicate a potential emergency and should be followed up as soon as possible.  Feel free to call the clinic should you have any questions or concerns. The clinic phone number is [phone].  Please show the [facility] at check-in to the Emergency Department and triage nurse.

## 2018-02-15 ENCOUNTER — Inpatient Hospital Stay: Payer: Medicare Other

## 2018-02-15 ENCOUNTER — Encounter: Payer: Self-pay | Admitting: Oncology

## 2018-02-15 ENCOUNTER — Inpatient Hospital Stay (HOSPITAL_BASED_OUTPATIENT_CLINIC_OR_DEPARTMENT_OTHER): Payer: Medicare Other | Admitting: Oncology

## 2018-02-15 VITALS — BP 124/73 | HR 64 | Temp 97.9°F | Resp 18 | Ht 62.0 in | Wt 150.3 lb

## 2018-02-15 DIAGNOSIS — E785 Hyperlipidemia, unspecified: Secondary | ICD-10-CM | POA: Diagnosis not present

## 2018-02-15 DIAGNOSIS — Z923 Personal history of irradiation: Secondary | ICD-10-CM | POA: Diagnosis not present

## 2018-02-15 DIAGNOSIS — C3492 Malignant neoplasm of unspecified part of left bronchus or lung: Secondary | ICD-10-CM

## 2018-02-15 DIAGNOSIS — C3412 Malignant neoplasm of upper lobe, left bronchus or lung: Secondary | ICD-10-CM

## 2018-02-15 DIAGNOSIS — Z7982 Long term (current) use of aspirin: Secondary | ICD-10-CM | POA: Diagnosis not present

## 2018-02-15 DIAGNOSIS — J449 Chronic obstructive pulmonary disease, unspecified: Secondary | ICD-10-CM | POA: Diagnosis not present

## 2018-02-15 DIAGNOSIS — G629 Polyneuropathy, unspecified: Secondary | ICD-10-CM

## 2018-02-15 DIAGNOSIS — I10 Essential (primary) hypertension: Secondary | ICD-10-CM | POA: Diagnosis not present

## 2018-02-15 DIAGNOSIS — Z95828 Presence of other vascular implants and grafts: Secondary | ICD-10-CM

## 2018-02-15 DIAGNOSIS — Z79899 Other long term (current) drug therapy: Secondary | ICD-10-CM

## 2018-02-15 DIAGNOSIS — Z5112 Encounter for antineoplastic immunotherapy: Secondary | ICD-10-CM

## 2018-02-15 DIAGNOSIS — Z8673 Personal history of transient ischemic attack (TIA), and cerebral infarction without residual deficits: Secondary | ICD-10-CM | POA: Diagnosis not present

## 2018-02-15 DIAGNOSIS — Z9221 Personal history of antineoplastic chemotherapy: Secondary | ICD-10-CM

## 2018-02-15 DIAGNOSIS — C3411 Malignant neoplasm of upper lobe, right bronchus or lung: Secondary | ICD-10-CM

## 2018-02-15 LAB — CBC WITH DIFFERENTIAL (CANCER CENTER ONLY)
BASOS ABS: 0.1 10*3/uL (ref 0.0–0.1)
Basophils Relative: 1 %
EOS ABS: 0.1 10*3/uL (ref 0.0–0.5)
EOS PCT: 1 %
HCT: 32.6 % — ABNORMAL LOW (ref 34.8–46.6)
Hemoglobin: 10.7 g/dL — ABNORMAL LOW (ref 11.6–15.9)
Lymphocytes Relative: 13 %
Lymphs Abs: 0.9 10*3/uL (ref 0.9–3.3)
MCH: 26.9 pg (ref 25.1–34.0)
MCHC: 32.7 g/dL (ref 31.5–36.0)
MCV: 82.3 fL (ref 79.5–101.0)
Monocytes Absolute: 0.5 10*3/uL (ref 0.1–0.9)
Monocytes Relative: 8 %
Neutro Abs: 5 10*3/uL (ref 1.5–6.5)
Neutrophils Relative %: 77 %
PLATELETS: 283 10*3/uL (ref 145–400)
RBC: 3.96 MIL/uL (ref 3.70–5.45)
RDW: 15.4 % — ABNORMAL HIGH (ref 11.2–14.5)
WBC Count: 6.5 10*3/uL (ref 3.9–10.3)

## 2018-02-15 LAB — CMP (CANCER CENTER ONLY)
ALT: 8 U/L (ref 0–44)
AST: 13 U/L — AB (ref 15–41)
Albumin: 3.5 g/dL (ref 3.5–5.0)
Alkaline Phosphatase: 93 U/L (ref 38–126)
Anion gap: 8 (ref 5–15)
BILIRUBIN TOTAL: 0.3 mg/dL (ref 0.3–1.2)
BUN: 14 mg/dL (ref 6–20)
CALCIUM: 9.3 mg/dL (ref 8.9–10.3)
CO2: 28 mmol/L (ref 22–32)
CREATININE: 0.77 mg/dL (ref 0.44–1.00)
Chloride: 107 mmol/L (ref 98–111)
GFR, Est AFR Am: >60 mL/min (ref >60)
Glucose, Bld: 109 mg/dL — ABNORMAL HIGH (ref 70–99)
Potassium: 3.6 mmol/L (ref 3.5–5.1)
Sodium: 143 mmol/L (ref 135–145)
TOTAL PROTEIN: 6.9 g/dL (ref 6.5–8.1)

## 2018-02-15 MED ORDER — SODIUM CHLORIDE 0.9 % IV SOLN
620.0000 mg | Freq: Once | INTRAVENOUS | Status: AC
  Administered 2018-02-15: 620 mg via INTRAVENOUS
  Filled 2018-02-15: qty 10

## 2018-02-15 MED ORDER — OXYCODONE-ACETAMINOPHEN 5-325 MG PO TABS: 1.0000 | ORAL_TABLET | Freq: Four times a day (QID) | ORAL | 0 refills | Status: DC | PRN

## 2018-02-15 MED ORDER — SODIUM CHLORIDE 0.9% FLUSH
10.0000 mL | INTRAVENOUS | Status: DC | PRN
  Administered 2018-02-15: 10 mL
  Filled 2018-02-15: qty 10

## 2018-02-15 MED ORDER — SODIUM CHLORIDE 0.9 % IV SOLN
Freq: Once | INTRAVENOUS | Status: AC
  Administered 2018-02-15: 11:00:00 via INTRAVENOUS
  Filled 2018-02-15: qty 250

## 2018-02-15 MED ORDER — SODIUM CHLORIDE 0.9% FLUSH
10.0000 mL | Freq: Once | INTRAVENOUS | Status: AC
  Administered 2018-02-15: 10 mL
  Filled 2018-02-15: qty 10

## 2018-02-15 MED ORDER — HEPARIN SOD (PORK) LOCK FLUSH 100 UNIT/ML IV SOLN
500.0000 [IU] | Freq: Once | INTRAVENOUS | Status: AC | PRN
  Administered 2018-02-15: 500 [IU]
  Filled 2018-02-15: qty 5

## 2018-02-15 NOTE — Progress Notes (Signed)
[facility] OFFICE PROGRESS NOTE  Herrera, [name], MD [place] [address]  DIAGNOSIS: Stage IIB(T2b, N1, M0) non-small cell lung cancer, adenocarcinoma presented with large left upper lobe lung mass in addition to left hilar lymphadenopathy diagnosed in April 2018. Her previous workup was done at [facility] in [facility]. There was insufficient material to perform the molecular studies.  GUARDANT 360 Molecular studies: BRCA2 N334f. Negative for EGFR, ALK, ROS1, BRAF mutations.  PRIOR THERAPY:  A course of concurrent chemoradiation with weekly carboplatin for AUC of 2 and paclitaxel 45 MG/M2. First dose 01/24/2017. Status post 6 cycles.  CURRENT THERAPY: Consolidation immunotherapy with Imfinzi (Durvalumab) 10 mg/KG every 2 weeks status post 22 cycles.  INTERVAL HISTORY: Felicia Worden62y.o. female returns for routine follow-up visit by herself.  The patient is feeling fine today with no specific complaints.  She continues to tolerate treatment with immunotherapy fairly well.  She denies fevers and chills.  Denies chest pain, shortness breath, cough, hemoptysis.  Denies nausea, vomiting, constipation, diarrhea.  Denies recent weight loss or night sweats.  The patient continues to have neuropathic pain to her right chest.  She uses Percocet on as-needed basis.  The patient is here for evaluation prior to cycle #23 of her treatment.  MEDICAL HISTORY: Past Medical History:  Diagnosis Date  . Adenocarcinoma of left lung, stage 2 ([facility] 12/27/2016  . Cancer ([facility]    Lun cancer: Right Dx 2008, s/p lobectomy. Left Dx 09/2016  . COPD (chronic obstructive pulmonary disease) ([facility]   . Encounter for antineoplastic chemotherapy 12/27/2016  . History of radiation therapy 01/24/17-03/08/17   left lung 2 Gy in 30 fractions  . Hyperlipidemia   . Hypertension   . Stroke ([facility]     ALLERGIES:  has No Known Allergies.  MEDICATIONS:  Current  Outpatient Medications  Medication Sig Dispense Refill  . aspirin EC 81 MG tablet Take 81 mg by mouth daily.    .Marland Kitchenatorvastatin (LIPITOR) 40 MG tablet Take 1 tablet (40 mg total) by mouth daily. 90 tablet 3  . fluticasone (FLONASE) 50 MCG/ACT nasal spray Place 1 spray into both nostrils 2 (two) times daily. 16 Herrera 6  . fluticasone furoate-vilanterol (BREO ELLIPTA) 200-25 MCG/INH AEPB Inhale 1 puff into the lungs daily. 90 each 1  . levothyroxine (SYNTHROID, LEVOTHROID) 75 MCG tablet Take 1 tablet (75 mcg total) by mouth daily before breakfast. 90 tablet 3  . nystatin cream (MYCOSTATIN) Apply 1 application topically 2 (two) times daily. 30 Herrera 0  . omeprazole (PRILOSEC) 20 MG capsule Take 1 capsule (20 mg total) by mouth daily. 90 capsule 3  . oxyCODONE-acetaminophen (PERCOCET/ROXICET) 5-325 MG tablet Take 1 tablet by mouth every 6 (six) hours as needed for severe pain. 30 tablet 0  . prochlorperazine (COMPAZINE) 10 MG tablet Take 1 tablet (10 mg total) by mouth every 6 (six) hours as needed for nausea or vomiting. (Patient not taking: Reported on 01/18/2018) 30 tablet 0   No current facility-administered medications for this visit.    Facility-Administered Medications Ordered in Other Visits  Medication Dose Route Frequency Provider Last Rate Last Dose  . sodium chloride flush (NS) 0.9 % injection 10 mL  10 mL Intracatheter PRN MCurt Bears MD   10 mL at 06/22/17 1747  . sodium chloride flush (NS) 0.9 % injection 10 mL  10 mL Intracatheter PRN MCurt Bears MD   10 mL at 02/15/18 1344    SURGICAL  HISTORY:  Past Surgical History:  Procedure Laterality Date  . BREAST BIOPSY     several. Denies Hx of breast cancer.  . IR FLUORO GUIDE PORT INSERTION RIGHT  02/04/2017  . IR US GUIDE VASC ACCESS RIGHT  02/04/2017  . THORACOTOMY/LOBECTOMY Right 2008  . TONSILLECTOMY  1960    REVIEW OF SYSTEMS:   Review of Systems  Constitutional: Negative for appetite change, chills, fatigue, fever and  unexpected weight change.  HENT:   Negative for mouth sores, nosebleeds, sore throat and trouble swallowing.   Eyes: Negative for eye problems and icterus.  Respiratory: Negative for cough, hemoptysis, shortness of breath and wheezing.   Cardiovascular: Negative for leg swelling.  Positive for neuropathic pain to her right chest. Gastrointestinal: Negative for abdominal pain, constipation, diarrhea, nausea and vomiting.  Genitourinary: Negative for bladder incontinence, difficulty urinating, dysuria, frequency and hematuria.   Musculoskeletal: Negative for back pain, gait problem, neck pain and neck stiffness.  Skin: Negative for itching and rash.  Neurological: Negative for dizziness, extremity weakness, gait problem, headaches, light-headedness and seizures.  Hematological: Negative for adenopathy. Does not bruise/bleed easily.  Psychiatric/Behavioral: Negative for confusion, depression and sleep disturbance. The patient is not nervous/anxious.     PHYSICAL EXAMINATION:  Blood pressure 124/73, pulse 64, temperature 97.9 F (36.6 C), temperature source Oral, resp. rate 18, height '5\' 2"'$  (1.575 m), weight 150 lb 4.8 oz (68.2 kg), SpO2 95 %.  ECOG PERFORMANCE STATUS: 1 - Symptomatic but completely ambulatory  Physical Exam  Constitutional: Oriented to person, place, and time and well-developed, well-nourished, and in no distress. No distress.  HENT:  Head: Normocephalic and atraumatic.  Mouth/Throat: Oropharynx is clear and moist. No oropharyngeal exudate.  Eyes: Conjunctivae are normal. Right eye exhibits no discharge. Left eye exhibits no discharge. No scleral icterus.  Neck: Normal range of motion. Neck supple.  Cardiovascular: Normal rate, regular rhythm, normal heart sounds and intact distal pulses.   Pulmonary/Chest: Effort normal and breath sounds normal. No respiratory distress. No wheezes. No rales.  Abdominal: Soft. Bowel sounds are normal. Exhibits no distension and no mass.  There is no tenderness.  Musculoskeletal: Normal range of motion. Exhibits no edema.  Lymphadenopathy:    No cervical adenopathy.  Neurological: Alert and oriented to person, place, and time. Exhibits normal muscle tone. Gait normal. Coordination normal.  Skin: Skin is warm and dry. No rash noted. Not diaphoretic. No erythema. No pallor.  Psychiatric: Mood, memory and judgment normal.  Vitals reviewed.  LABORATORY DATA: Lab Results  Component Value Date   WBC 6.5 02/15/2018   HGB 10.7 (L) 02/15/2018   HCT 32.6 (L) 02/15/2018   MCV 82.3 02/15/2018   PLT 283 02/15/2018      Chemistry      Component Value Date/Time   NA 143 02/15/2018 0928   NA 142 06/22/2017 1335   K 3.6 02/15/2018 0928   K 3.9 06/22/2017 1335   CL 107 02/15/2018 0928   CO2 28 02/15/2018 0928   CO2 26 06/22/2017 1335   BUN 14 02/15/2018 0928   BUN 13.6 06/22/2017 1335   CREATININE 0.77 02/15/2018 0928   CREATININE 0.7 06/22/2017 1335      Component Value Date/Time   CALCIUM 9.3 02/15/2018 0928   CALCIUM 9.4 06/22/2017 1335   ALKPHOS 93 02/15/2018 0928   ALKPHOS 93 06/22/2017 1335   AST 13 (L) 02/15/2018 0928   AST 12 06/22/2017 1335   ALT 8 02/15/2018 0928   ALT 15 06/22/2017 1335  BILITOT 0.3 02/15/2018 0928   BILITOT 0.25 06/22/2017 1335       RADIOGRAPHIC STUDIES:  No results found.   ASSESSMENT/PLAN:  Adenocarcinoma of left lung, stage 2 ([place]) This is a very pleasant 62 year old white female recently diagnosed with unresectable a stage IIB non-small cell lung cancer, adenocarcinoma. The patient underwent a course of concurrent chemoradiation with weekly carboplatin and paclitaxel status post 6 cycles.  She tolerated the treatment well and had partial response. She is currently undergoing consolidation treatment with immunotherapy with Imfinzi (Durvalumab) status post 22 cycles. The patient continues to tolerate this treatment well. I recommended for her to proceed with cycle #23 today as  scheduled. I will see her back for follow-up visit in 2 weeks for evaluation with the start of cycle #24.  She was given a refill of her Percocet today.  She was advised to call immediately if she has any concerning symptoms in the interval. The patient voices understanding of current disease status and treatment options and is in agreement with the current care plan. All questions were answered. The patient knows to call the clinic with any problems, questions or concerns. We can certainly see the patient much sooner if necessary.   No orders of the defined types were placed in this encounter.    Mikey Bussing, DNP, AGPCNP-BC, AOCNP 02/15/18

## 2018-02-15 NOTE — Assessment & Plan Note (Addendum)
This is a very pleasant 62 year old white female recently diagnosed with unresectable a stage IIB non-small cell lung cancer, adenocarcinoma. The patient underwent a course of concurrent chemoradiation with weekly carboplatin and paclitaxel status post 6 cycles.  She tolerated the treatment well and had partial response. She is currently undergoing consolidation treatment with immunotherapy with Imfinzi (Durvalumab) status post 22 cycles. The patient continues to tolerate this treatment well. I recommended for her to proceed with cycle #23 today as scheduled. I will see her back for follow-up visit in 2 weeks for evaluation with the start of cycle #24.  She was given a refill of her Percocet today.  She was advised to call immediately if she has any concerning symptoms in the interval. The patient voices understanding of current disease status and treatment options and is in agreement with the current care plan. All questions were answered. The patient knows to call the clinic with any problems, questions or concerns. We can certainly see the patient much sooner if necessary.

## 2018-02-15 NOTE — Patient Instructions (Signed)
[facility] Discharge Instructions for Patients Receiving Chemotherapy  Today you received the following chemotherapy agents Imfinzi.  To help prevent nausea and vomiting after your treatment, we encourage you to take your nausea medication as prescribed.   If you develop nausea and vomiting that is not controlled by your nausea medication, call the clinic.   BELOW ARE SYMPTOMS THAT SHOULD BE REPORTED IMMEDIATELY:  *FEVER GREATER THAN 100.5 F  *CHILLS WITH OR WITHOUT FEVER  NAUSEA AND VOMITING THAT IS NOT CONTROLLED WITH YOUR NAUSEA MEDICATION  *UNUSUAL SHORTNESS OF BREATH  *UNUSUAL BRUISING OR BLEEDING  TENDERNESS IN MOUTH AND THROAT WITH OR WITHOUT PRESENCE OF ULCERS  *URINARY PROBLEMS  *BOWEL PROBLEMS  UNUSUAL RASH Items with * indicate a potential emergency and should be followed up as soon as possible.  Feel free to call the clinic should you have any questions or concerns. The clinic phone number is [phone].  Please show the [facility] at check-in to the Emergency Department and triage nurse.

## 2018-02-16 ENCOUNTER — Telehealth: Payer: Self-pay | Admitting: Oncology

## 2018-02-16 NOTE — Telephone Encounter (Signed)
appts already scheduled per 8/28 los.

## 2018-02-27 ENCOUNTER — Other Ambulatory Visit: Payer: Self-pay | Admitting: Internal Medicine

## 2018-02-27 DIAGNOSIS — Z8709 Personal history of other diseases of the respiratory system: Secondary | ICD-10-CM

## 2018-03-01 ENCOUNTER — Inpatient Hospital Stay: Payer: Medicare Other

## 2018-03-01 ENCOUNTER — Telehealth: Payer: Self-pay | Admitting: Oncology

## 2018-03-01 ENCOUNTER — Inpatient Hospital Stay (HOSPITAL_BASED_OUTPATIENT_CLINIC_OR_DEPARTMENT_OTHER): Payer: Medicare Other | Admitting: Oncology

## 2018-03-01 ENCOUNTER — Encounter: Payer: Self-pay | Admitting: Oncology

## 2018-03-01 ENCOUNTER — Other Ambulatory Visit: Payer: Self-pay | Admitting: Oncology

## 2018-03-01 ENCOUNTER — Inpatient Hospital Stay: Payer: Medicare Other | Attending: Nurse Practitioner

## 2018-03-01 VITALS — BP 117/78 | HR 65 | Temp 97.8°F | Resp 18 | Ht 62.0 in | Wt 151.2 lb

## 2018-03-01 DIAGNOSIS — Z9221 Personal history of antineoplastic chemotherapy: Secondary | ICD-10-CM | POA: Diagnosis not present

## 2018-03-01 DIAGNOSIS — Z5112 Encounter for antineoplastic immunotherapy: Secondary | ICD-10-CM | POA: Insufficient documentation

## 2018-03-01 DIAGNOSIS — I10 Essential (primary) hypertension: Secondary | ICD-10-CM | POA: Insufficient documentation

## 2018-03-01 DIAGNOSIS — G629 Polyneuropathy, unspecified: Secondary | ICD-10-CM | POA: Diagnosis not present

## 2018-03-01 DIAGNOSIS — Z79899 Other long term (current) drug therapy: Secondary | ICD-10-CM

## 2018-03-01 DIAGNOSIS — Z923 Personal history of irradiation: Secondary | ICD-10-CM

## 2018-03-01 DIAGNOSIS — J449 Chronic obstructive pulmonary disease, unspecified: Secondary | ICD-10-CM

## 2018-03-01 DIAGNOSIS — C3411 Malignant neoplasm of upper lobe, right bronchus or lung: Secondary | ICD-10-CM

## 2018-03-01 DIAGNOSIS — Z8673 Personal history of transient ischemic attack (TIA), and cerebral infarction without residual deficits: Secondary | ICD-10-CM | POA: Insufficient documentation

## 2018-03-01 DIAGNOSIS — C3412 Malignant neoplasm of upper lobe, left bronchus or lung: Secondary | ICD-10-CM

## 2018-03-01 DIAGNOSIS — R5382 Chronic fatigue, unspecified: Secondary | ICD-10-CM

## 2018-03-01 DIAGNOSIS — Z7982 Long term (current) use of aspirin: Secondary | ICD-10-CM

## 2018-03-01 DIAGNOSIS — Z95828 Presence of other vascular implants and grafts: Secondary | ICD-10-CM

## 2018-03-01 DIAGNOSIS — C3492 Malignant neoplasm of unspecified part of left bronchus or lung: Secondary | ICD-10-CM

## 2018-03-01 DIAGNOSIS — E785 Hyperlipidemia, unspecified: Secondary | ICD-10-CM | POA: Insufficient documentation

## 2018-03-01 LAB — CBC WITH DIFFERENTIAL (CANCER CENTER ONLY)
Basophils Absolute: 0 10*3/uL (ref 0.0–0.1)
Basophils Relative: 1 %
EOS ABS: 0.1 10*3/uL (ref 0.0–0.5)
Eosinophils Relative: 1 %
HCT: 35 % (ref 34.8–46.6)
Hemoglobin: 11.3 g/dL — ABNORMAL LOW (ref 11.6–15.9)
LYMPHS ABS: 0.8 10*3/uL — AB (ref 0.9–3.3)
Lymphocytes Relative: 10 %
MCH: 26.8 pg (ref 25.1–34.0)
MCHC: 32.4 g/dL (ref 31.5–36.0)
MCV: 82.7 fL (ref 79.5–101.0)
MONO ABS: 0.4 10*3/uL (ref 0.1–0.9)
MONOS PCT: 6 %
Neutro Abs: 6 10*3/uL (ref 1.5–6.5)
Neutrophils Relative %: 82 %
Platelet Count: 352 10*3/uL (ref 145–400)
RBC: 4.23 MIL/uL (ref 3.70–5.45)
RDW: 16 % — ABNORMAL HIGH (ref 11.2–14.5)
WBC Count: 7.3 10*3/uL (ref 3.9–10.3)

## 2018-03-01 LAB — CMP (CANCER CENTER ONLY)
ALK PHOS: 104 U/L (ref 38–126)
ALT: 8 U/L (ref 0–44)
AST: 13 U/L — ABNORMAL LOW (ref 15–41)
Albumin: 3.6 g/dL (ref 3.5–5.0)
Anion gap: 12 (ref 5–15)
BUN: 13 mg/dL (ref 6–20)
CALCIUM: 9.5 mg/dL (ref 8.9–10.3)
CO2: 25 mmol/L (ref 22–32)
CREATININE: 0.79 mg/dL (ref 0.44–1.00)
Chloride: 105 mmol/L (ref 98–111)
GFR, Est AFR Am: >60 mL/min (ref >60)
GFR, Estimated: >60 mL/min (ref >60)
GLUCOSE: 120 mg/dL — AB (ref 70–99)
Potassium: 3.7 mmol/L (ref 3.5–5.1)
SODIUM: 142 mmol/L (ref 135–145)
Total Bilirubin: 0.4 mg/dL (ref 0.3–1.2)
Total Protein: 7.3 g/dL (ref 6.5–8.1)

## 2018-03-01 LAB — TSH: TSH: 1.83 u[IU]/mL (ref 0.308–3.960)

## 2018-03-01 MED ORDER — HEPARIN SOD (PORK) LOCK FLUSH 100 UNIT/ML IV SOLN
500.0000 [IU] | Freq: Once | INTRAVENOUS | Status: AC | PRN
  Administered 2018-03-01: 500 [IU]
  Filled 2018-03-01: qty 5

## 2018-03-01 MED ORDER — SODIUM CHLORIDE 0.9% FLUSH
10.0000 mL | Freq: Once | INTRAVENOUS | Status: AC
  Administered 2018-03-01: 10 mL
  Filled 2018-03-01: qty 10

## 2018-03-01 MED ORDER — SODIUM CHLORIDE 0.9 % IV SOLN
620.0000 mg | Freq: Once | INTRAVENOUS | Status: AC
  Administered 2018-03-01: 620 mg via INTRAVENOUS
  Filled 2018-03-01: qty 10

## 2018-03-01 MED ORDER — SODIUM CHLORIDE 0.9 % IV SOLN
Freq: Once | INTRAVENOUS | Status: AC
  Administered 2018-03-01: 13:00:00 via INTRAVENOUS
  Filled 2018-03-01: qty 250

## 2018-03-01 MED ORDER — SODIUM CHLORIDE 0.9% FLUSH
10.0000 mL | INTRAVENOUS | Status: DC | PRN
  Administered 2018-03-01: 10 mL
  Filled 2018-03-01: qty 10

## 2018-03-01 NOTE — Patient Instructions (Signed)
[facility] Discharge Instructions for Patients Receiving Chemotherapy  Today you received the following chemotherapy agents Imfinzi.  To help prevent nausea and vomiting after your treatment, we encourage you to take your nausea medication as prescribed.   If you develop nausea and vomiting that is not controlled by your nausea medication, call the clinic.   BELOW ARE SYMPTOMS THAT SHOULD BE REPORTED IMMEDIATELY:  *FEVER GREATER THAN 100.5 F  *CHILLS WITH OR WITHOUT FEVER  NAUSEA AND VOMITING THAT IS NOT CONTROLLED WITH YOUR NAUSEA MEDICATION  *UNUSUAL SHORTNESS OF BREATH  *UNUSUAL BRUISING OR BLEEDING  TENDERNESS IN MOUTH AND THROAT WITH OR WITHOUT PRESENCE OF ULCERS  *URINARY PROBLEMS  *BOWEL PROBLEMS  UNUSUAL RASH Items with * indicate a potential emergency and should be followed up as soon as possible.  Feel free to call the clinic should you have any questions or concerns. The clinic phone number is [phone].  Please show the [facility] at check-in to the Emergency Department and triage nurse.

## 2018-03-01 NOTE — Assessment & Plan Note (Addendum)
This is a very pleasant 62 year old white female recently diagnosed with unresectable a stage IIB non-small cell lung cancer, adenocarcinoma. The patient underwent a course of concurrent chemoradiation with weekly carboplatin and paclitaxel status post 6 cycles. She tolerated the treatment well and had partial response. She is currently undergoing consolidation treatment with immunotherapy with Imfinzi (Durvalumab) status post23cycles. The patient continues to tolerate this treatment well. I recommended for her to proceed with cycle #24 today as scheduled. I will see her back for follow-up visit in 2 weeks for evaluation with the start of cycle #25.  She was advised to call immediately if she has any concerning symptoms in the interval. The patient voices understanding of current disease status and treatment options and is in agreement with the current care plan. All questions were answered. The patient knows to call the clinic with any problems, questions or concerns. We can certainly see the patient much sooner if necessary.

## 2018-03-01 NOTE — Progress Notes (Signed)
[facility] OFFICE PROGRESS NOTE  [name], [name], MD [place] [address]  DIAGNOSIS:Stage IIB(T2b, N1, M0) non-small cell lung cancer, adenocarcinoma presented with large left upper lobe lung mass in addition to left hilar lymphadenopathy diagnosed in April 2018. Her previous workup was done at [facility] in [facility]. There was insufficient material to perform the molecular studies.  GUARDANT 360 Molecular studies:BRCA2 N326f. Negative for EGFR, ALK, ROS1, BRAF mutations.  PRIOR THERAPY: A course of concurrent chemoradiation with weekly carboplatin for AUC of 2 and paclitaxel 45 MG/M2. First dose 01/24/2017. Status post 6 cycles.  CURRENT THERAPY: Consolidation immunotherapy with Imfinzi (Durvalumab) 10 mg/KG every 2 weeks status post23cycles.  INTERVAL HISTORY: Felicia Rezek89y.o. female returns for a routine follow-up visit by herself.  The patient is feeling fine today with no specific complaints.  She continues to tolerate treatment with immunotherapy fairly well.  She denies fevers and chills.  Denies chest pain, shortness breath, cough, hemoptysis.  Denies nausea, vomiting, constipation, diarrhea.  Denies recent weight loss or night sweats.  The patient is here for evaluation prior to cycle #25 of her treatment.  MEDICAL HISTORY: Past Medical History:  Diagnosis Date  . [place] 12/27[place]  . Cancer ([facility]    Lun cancer: Right Dx 2008, s/p lobectomy. Left Dx 09/2016  . COPD (chronic obstructive pulmonary disease) ([place]   . Encounter for antineoplastic chemotherapy 12/27/2016  . History of radiation therapy 01/24/17-03/08/17   left lung 2 Gy in 30 fractions  . Hyperlipidemia   . Hypertension   . Stroke ([facility]     ALLERGIES:  has No Known Allergies.  MEDICATIONS:  Current Outpatient Medications  Medication Sig Dispense Refill  . aspirin EC 81 MG tablet Take 81 mg by mouth  daily.    .Marland Kitchenatorvastatin (LIPITOR) 40 MG tablet Take 1 tablet (40 mg total) by mouth daily. 90 tablet 3  . BREO ELLIPTA 200-25 MCG/INH AEPB USE 1 INHALATION BY MOUTH  DAILY 180 each 1  . fluticasone (FLONASE) 50 MCG/ACT nasal spray Place 1 spray into both nostrils 2 (two) times daily. 16 g 6  . levothyroxine (SYNTHROID, LEVOTHROID) 75 MCG tablet Take 1 tablet (75 mcg total) by mouth daily before breakfast. 90 tablet 3  . nystatin cream (MYCOSTATIN) Apply 1 application topically 2 (two) times daily. 30 g 0  . omeprazole (PRILOSEC) 20 MG capsule Take 1 capsule (20 mg total) by mouth daily. 90 capsule 3  . oxyCODONE-acetaminophen (PERCOCET/ROXICET) 5-325 MG tablet Take 1 tablet by mouth every 6 (six) hours as needed for severe pain. 30 tablet 0  . prochlorperazine (COMPAZINE) 10 MG tablet Take 1 tablet (10 mg total) by mouth every 6 (six) hours as needed for nausea or vomiting. (Patient not taking: Reported on 01/18/2018) 30 tablet 0   No current facility-administered medications for this visit.    Facility-Administered Medications Ordered in Other Visits  Medication Dose Route Frequency Provider Last Rate Last Dose  . sodium chloride flush (NS) 0.9 % injection 10 mL  10 mL Intracatheter PRN MCurt Bears MD   10 mL at 06/22/17 1747    SURGICAL HISTORY:  Past Surgical History:  Procedure Laterality Date  . BREAST BIOPSY     several. Denies Hx of breast cancer.  . IR FLUORO GUIDE PORT INSERTION RIGHT  02/04/2017  . IR [place] VASC ACCESS RIGHT  02/04/2017  . THORACOTOMY/LOBECTOMY Right 2008  . TONSILLECTOMY  1960    REVIEW OF SYSTEMS:   Review of Systems  Constitutional: Negative for appetite change, chills, fatigue, fever and unexpected weight change.  HENT:   Negative for mouth sores, nosebleeds, sore throat and trouble swallowing.   Eyes: Negative for eye problems and icterus.  Respiratory: Negative for cough, hemoptysis, shortness of breath and wheezing.   Cardiovascular: Negative  for chest pain and leg swelling.  Gastrointestinal: Negative for abdominal pain, constipation, diarrhea, nausea and vomiting.  Genitourinary: Negative for bladder incontinence, difficulty urinating, dysuria, frequency and hematuria.   Musculoskeletal: Negative for back pain, gait problem, neck pain and neck stiffness.  Skin: Negative for itching and rash.  Neurological: Negative for dizziness, extremity weakness, gait problem, headaches, light-headedness and seizures.  Hematological: Negative for adenopathy. Does not bruise/bleed easily.  Psychiatric/Behavioral: Negative for confusion, depression and sleep disturbance. The patient is not nervous/anxious.     PHYSICAL EXAMINATION:  Blood pressure 117/78, pulse 65, temperature 97.8 F (36.6 C), temperature source Oral, resp. rate 18, height '5\' 2"'$  (1.575 m), weight 151 lb 3.2 oz (68.6 kg), SpO2 96 %.  ECOG PERFORMANCE STATUS: 1 - Symptomatic but completely ambulatory  Physical Exam  Constitutional: Oriented to person, place, and time and well-developed, well-nourished, and in no distress. No distress.  HENT:  Head: Normocephalic and atraumatic.  Mouth/Throat: Oropharynx is clear and moist. No oropharyngeal exudate.  Eyes: Conjunctivae are normal. Right eye exhibits no discharge. Left eye exhibits no discharge. No scleral icterus.  Neck: Normal range of motion. Neck supple.  Cardiovascular: Normal rate, regular rhythm, normal heart sounds and intact distal pulses.   Pulmonary/Chest: Effort normal and breath sounds normal. No respiratory distress. No wheezes. No rales.  Abdominal: Soft. Bowel sounds are normal. Exhibits no distension and no mass. There is no tenderness.  Musculoskeletal: Normal range of motion. Exhibits no edema.  Lymphadenopathy:    No cervical adenopathy.  Neurological: Alert and oriented to person, place, and time. Exhibits normal muscle tone. Gait normal. Coordination normal.  Skin: Skin is warm and dry. No rash noted.  Not diaphoretic. No erythema. No pallor.  Psychiatric: Mood, memory and judgment normal.  Vitals reviewed.  LABORATORY DATA: Lab Results  Component Value Date   WBC 7.3 03/01/2018   HGB 11.3 (L) 03/01/2018   HCT 35.0 03/01/2018   MCV 82.7 03/01/2018   PLT 352 03/01/2018      Chemistry      Component Value Date/Time   NA 143 02/15/2018 0928   NA 142 06/22/2017 1335   K 3.6 02/15/2018 0928   K 3.9 06/22/2017 1335   CL 107 02/15/2018 0928   CO2 28 02/15/2018 0928   CO2 26 06/22/2017 1335   BUN 14 02/15/2018 0928   BUN 13.6 06/22/2017 1335   CREATININE 0.77 02/15/2018 0928   CREATININE 0.7 06/22/2017 1335      Component Value Date/Time   CALCIUM 9.3 02/15/2018 0928   CALCIUM 9.4 06/22/2017 1335   ALKPHOS 93 02/15/2018 0928   ALKPHOS 93 06/22/2017 1335   AST 13 (L) 02/15/2018 0928   AST 12 06/22/2017 1335   ALT 8 02/15/2018 0928   ALT 15 06/22/2017 1335   BILITOT 0.3 02/15/2018 0928   BILITOT 0.25 06/22/2017 1335       RADIOGRAPHIC STUDIES:  No results found.   ASSESSMENT/PLAN:  Adenocarcinoma of left lung, stage 2 ([place]) This is a very pleasant 62 year old white female recently diagnosed with unresectable a stage IIB non-small cell lung cancer, adenocarcinoma. The patient  underwent a course of concurrent chemoradiation with weekly carboplatin and paclitaxel status post 6 cycles. She tolerated the treatment well and had partial response. She is currently undergoing consolidation treatment with immunotherapy with Imfinzi (Durvalumab) status post23cycles. The patient continues to tolerate this treatment well. I recommended for her to proceed with cycle #24 today as scheduled. I will see her back for follow-up visit in 2 weeks for evaluation with the start of cycle #25.  She was advised to call immediately if she has any concerning symptoms in the interval. The patient voices understanding of current disease status and treatment options and is in agreement with  the current care plan. All questions were answered. The patient knows to call the clinic with any problems, questions or concerns. We can certainly see the patient much sooner if necessary.   Orders Placed This Encounter  Procedures  . CBC with Differential (Cancer Center Only)    Standing Status:   Future    Standing Expiration Date:   03/02/2019  . CMP ([place] [facility] only)    Standing Status:   Future    Standing Expiration Date:   03/02/2019  . CBC with Differential (Cancer Center Only)    Standing Status:   Future    Standing Expiration Date:   03/02/2019  . CMP ([place] only)    Standing Status:   Future    Standing Expiration Date:   03/02/2019  . TSH    Standing Status:   Future    Standing Expiration Date:   03/02/2019     Mikey Bussing, DNP, AGPCNP-BC, AOCNP 03/01/18

## 2018-03-01 NOTE — Telephone Encounter (Signed)
Appts already scheduled per 9/11 los.

## 2018-03-15 ENCOUNTER — Inpatient Hospital Stay: Payer: Medicare Other

## 2018-03-15 ENCOUNTER — Encounter: Payer: Self-pay | Admitting: Oncology

## 2018-03-15 ENCOUNTER — Ambulatory Visit: Payer: Medicare Other

## 2018-03-15 ENCOUNTER — Inpatient Hospital Stay (HOSPITAL_BASED_OUTPATIENT_CLINIC_OR_DEPARTMENT_OTHER): Payer: Medicare Other | Admitting: Oncology

## 2018-03-15 VITALS — BP 139/53 | HR 72 | Temp 97.8°F | Resp 17 | Wt 152.4 lb

## 2018-03-15 DIAGNOSIS — G629 Polyneuropathy, unspecified: Secondary | ICD-10-CM | POA: Diagnosis not present

## 2018-03-15 DIAGNOSIS — Z5112 Encounter for antineoplastic immunotherapy: Secondary | ICD-10-CM

## 2018-03-15 DIAGNOSIS — Z9221 Personal history of antineoplastic chemotherapy: Secondary | ICD-10-CM | POA: Diagnosis not present

## 2018-03-15 DIAGNOSIS — C3492 Malignant neoplasm of unspecified part of left bronchus or lung: Secondary | ICD-10-CM

## 2018-03-15 DIAGNOSIS — Z923 Personal history of irradiation: Secondary | ICD-10-CM

## 2018-03-15 DIAGNOSIS — C3412 Malignant neoplasm of upper lobe, left bronchus or lung: Secondary | ICD-10-CM

## 2018-03-15 DIAGNOSIS — I10 Essential (primary) hypertension: Secondary | ICD-10-CM | POA: Diagnosis not present

## 2018-03-15 DIAGNOSIS — Z7982 Long term (current) use of aspirin: Secondary | ICD-10-CM

## 2018-03-15 DIAGNOSIS — J449 Chronic obstructive pulmonary disease, unspecified: Secondary | ICD-10-CM | POA: Diagnosis not present

## 2018-03-15 DIAGNOSIS — Z79899 Other long term (current) drug therapy: Secondary | ICD-10-CM | POA: Diagnosis not present

## 2018-03-15 DIAGNOSIS — Z95828 Presence of other vascular implants and grafts: Secondary | ICD-10-CM

## 2018-03-15 DIAGNOSIS — C3411 Malignant neoplasm of upper lobe, right bronchus or lung: Secondary | ICD-10-CM

## 2018-03-15 DIAGNOSIS — Z8673 Personal history of transient ischemic attack (TIA), and cerebral infarction without residual deficits: Secondary | ICD-10-CM | POA: Diagnosis not present

## 2018-03-15 DIAGNOSIS — E785 Hyperlipidemia, unspecified: Secondary | ICD-10-CM | POA: Diagnosis not present

## 2018-03-15 LAB — CBC WITH DIFFERENTIAL (CANCER CENTER ONLY)
Basophils Absolute: 0 10*3/uL (ref 0.0–0.1)
Basophils Relative: 0 %
EOS PCT: 2 %
Eosinophils Absolute: 0.1 10*3/uL (ref 0.0–0.5)
HCT: 34.3 % — ABNORMAL LOW (ref 34.8–46.6)
Hemoglobin: 10.9 g/dL — ABNORMAL LOW (ref 11.6–15.9)
LYMPHS PCT: 15 %
Lymphs Abs: 1 10*3/uL (ref 0.9–3.3)
MCH: 26.8 pg (ref 25.1–34.0)
MCHC: 31.8 g/dL (ref 31.5–36.0)
MCV: 84.3 fL (ref 79.5–101.0)
Monocytes Absolute: 0.4 10*3/uL (ref 0.1–0.9)
Monocytes Relative: 6 %
Neutro Abs: 5.2 10*3/uL (ref 1.5–6.5)
Neutrophils Relative %: 77 %
PLATELETS: 328 10*3/uL (ref 145–400)
RBC: 4.07 MIL/uL (ref 3.70–5.45)
RDW: 15 % — ABNORMAL HIGH (ref 11.2–14.5)
WBC: 6.7 10*3/uL (ref 3.9–10.3)

## 2018-03-15 LAB — CMP (CANCER CENTER ONLY)
ALBUMIN: 3.5 g/dL (ref 3.5–5.0)
ALT: 11 U/L (ref 0–44)
AST: 12 U/L — AB (ref 15–41)
Alkaline Phosphatase: 101 U/L (ref 38–126)
Anion gap: 8 (ref 5–15)
BUN: 16 mg/dL (ref 6–20)
CHLORIDE: 106 mmol/L (ref 98–111)
CO2: 28 mmol/L (ref 22–32)
CREATININE: 0.75 mg/dL (ref 0.44–1.00)
Calcium: 8.9 mg/dL (ref 8.9–10.3)
GFR, Est AFR Am: >60 mL/min (ref >60)
GFR, Estimated: >60 mL/min (ref >60)
Glucose, Bld: 115 mg/dL — ABNORMAL HIGH (ref 70–99)
Potassium: 3.5 mmol/L (ref 3.5–5.1)
SODIUM: 142 mmol/L (ref 135–145)
Total Bilirubin: 0.4 mg/dL (ref 0.3–1.2)
Total Protein: 6.9 g/dL (ref 6.5–8.1)

## 2018-03-15 MED ORDER — HEPARIN SOD (PORK) LOCK FLUSH 100 UNIT/ML IV SOLN
500.0000 [IU] | Freq: Once | INTRAVENOUS | Status: AC | PRN
  Administered 2018-03-15: 500 [IU]
  Filled 2018-03-15: qty 5

## 2018-03-15 MED ORDER — SODIUM CHLORIDE 0.9 % IV SOLN
Freq: Once | INTRAVENOUS | Status: AC
  Administered 2018-03-15: 11:00:00 via INTRAVENOUS
  Filled 2018-03-15: qty 250

## 2018-03-15 MED ORDER — OXYCODONE-ACETAMINOPHEN 5-325 MG PO TABS: 1.0000 | ORAL_TABLET | Freq: Four times a day (QID) | ORAL | 0 refills | Status: DC | PRN

## 2018-03-15 MED ORDER — SODIUM CHLORIDE 0.9% FLUSH
10.0000 mL | Freq: Once | INTRAVENOUS | Status: AC
  Administered 2018-03-15: 10 mL
  Filled 2018-03-15: qty 10

## 2018-03-15 MED ORDER — SODIUM CHLORIDE 0.9 % IV SOLN
9.3000 mg/kg | Freq: Once | INTRAVENOUS | Status: AC
  Administered 2018-03-15: 620 mg via INTRAVENOUS
  Filled 2018-03-15: qty 10

## 2018-03-15 MED ORDER — SODIUM CHLORIDE 0.9% FLUSH
10.0000 mL | INTRAVENOUS | Status: DC | PRN
  Administered 2018-03-15: 10 mL
  Filled 2018-03-15: qty 10

## 2018-03-15 NOTE — Assessment & Plan Note (Signed)
This is a very pleasant 62 year old white female recently diagnosed with unresectable a stage IIB non-small cell lung cancer, adenocarcinoma. The patient underwent a course of concurrent chemoradiation with weekly carboplatin and paclitaxel status post 6 cycles. She tolerated the treatment well and had partial response. She is currently undergoing consolidation treatment with immunotherapy with Imfinzi (Durvalumab) status post24cycles. The patient continues to tolerate this treatment well. I recommended for her to proceed with cycle #25today as scheduled. I will see her back for follow-up visit in 2 weeks for evaluation with the start of cycle #26.  For her right chest neuropathic pain, I have refilled her Percocet today.  She was advised to call immediately if she has any concerning symptoms in the interval. The patient voices understanding of current disease status and treatment options and is in agreement with the current care plan. All questions were answered. The patient knows to call the clinic with any problems, questions or concerns. We can certainly see the patient much sooner if necessary.

## 2018-03-15 NOTE — Progress Notes (Signed)
[facility] OFFICE PROGRESS NOTE  Felicia, [name], MD [place] [address]  DIAGNOSIS:Stage IIB(T2b, N1, M0) non-small cell lung cancer, adenocarcinoma presented with large left upper lobe lung mass in addition to left hilar lymphadenopathy diagnosed in April 2018. Her previous workup was done at [facility] in [facility]. There was insufficient material to perform the molecular studies.  GUARDANT 360 Molecular studies:BRCA2 N32f. Negative for EGFR, ALK, ROS1, BRAF mutations.  PRIOR THERAPY: A course of concurrent chemoradiation with weekly carboplatin for AUC of 2 and paclitaxel 45 MG/M2. First dose 01/24/2017. Status post 6 cycles.  CURRENT THERAPY: Consolidation immunotherapy with Imfinzi (Durvalumab) 10 mg/KG every 2 weeks status post24cycles.  INTERVAL HISTORY: [name]62y.o. female returns for a routine follow-up visit by herself.  The patient is feeling fine today and has no specific complaints.  She denies fevers and chills.  Denies chest pain, shortness breath, cough, hemoptysis.  Denies nausea, vomiting, constipation, diarrhea.  Denies recent weight loss or night sweats.  She continues to have neuropathic pain to the right chest and uses Percocet on as-needed basis.  The patient is here for evaluation prior to cycle #25 over treatment.  MEDICAL HISTORY: Past Medical History:  Diagnosis Date  . Adenocarcinoma of left lung, stage 2 ([facility] 12/27/2016  . Cancer ([facility]    Lun cancer: Right Dx 2008, s/p lobectomy. Left Dx 09/2016  . COPD (chronic obstructive pulmonary disease) ([facility]   . Encounter for antineoplastic chemotherapy 12/27/2016  . History of radiation therapy 01/24/17-03/08/17   left lung 2 Gy in 30 fractions  . Hyperlipidemia   . Hypertension   . Stroke ([facility]     ALLERGIES:  has No Known Allergies.  MEDICATIONS:  Current Outpatient Medications  Medication Sig Dispense Refill  . aspirin EC 81  MG tablet Take 81 mg by mouth daily.    .Marland Kitchenatorvastatin (LIPITOR) 40 MG tablet Take 1 tablet (40 mg total) by mouth daily. 90 tablet 3  . BREO ELLIPTA 200-25 MCG/INH AEPB USE 1 INHALATION BY MOUTH  DAILY 180 each 1  . fluticasone (FLONASE) 50 MCG/ACT nasal spray Place 1 spray into both nostrils 2 (two) times daily. 16 Herrera 6  . levothyroxine (SYNTHROID, LEVOTHROID) 75 MCG tablet Take 1 tablet (75 mcg total) by mouth daily before breakfast. 90 tablet 3  . nystatin cream (MYCOSTATIN) Apply 1 application topically 2 (two) times daily. 30 Herrera 0  . omeprazole (PRILOSEC) 20 MG capsule Take 1 capsule (20 mg total) by mouth daily. 90 capsule 3  . oxyCODONE-acetaminophen (PERCOCET/ROXICET) 5-325 MG tablet Take 1 tablet by mouth every 6 (six) hours as needed for severe pain. 30 tablet 0  . prochlorperazine (COMPAZINE) 10 MG tablet Take 1 tablet (10 mg total) by mouth every 6 (six) hours as needed for nausea or vomiting. (Patient not taking: Reported on 01/18/2018) 30 tablet 0   No current facility-administered medications for this visit.    Facility-Administered Medications Ordered in Other Visits  Medication Dose Route Frequency Provider Last Rate Last Dose  . durvalumab (IMFINZI) 620 mg in sodium chloride 0.9 % 100 mL chemo infusion  9.3 mg/kg (Treatment Plan Recorded) Intravenous Once MCurt Bears MD      . heparin lock flush 100 unit/mL  500 Units Intracatheter Once PRN MCurt Bears MD      . sodium chloride flush (NS) 0.9 % injection 10 mL  10 mL Intracatheter PRN MCurt Bears MD   10 mL at  06/22/17 1747  . sodium chloride flush (NS) 0.9 % injection 10 mL  10 mL Intracatheter PRN Curt Bears, MD        SURGICAL HISTORY:  Past Surgical History:  Procedure Laterality Date  . BREAST BIOPSY     several. Denies Hx of breast cancer.  . IR FLUORO GUIDE PORT INSERTION RIGHT  02/04/2017  . IR US GUIDE VASC ACCESS RIGHT  02/04/2017  . THORACOTOMY/LOBECTOMY Right 2008  . TONSILLECTOMY  1960     REVIEW OF SYSTEMS:   Review of Systems  Constitutional: Negative for appetite change, chills, fatigue, fever and unexpected weight change.  HENT:   Negative for mouth sores, nosebleeds, sore throat and trouble swallowing.   Eyes: Negative for eye problems and icterus.  Respiratory: Negative for cough, hemoptysis, shortness of breath and wheezing.   Cardiovascular: Negative for leg swelling.  Positive for right chest neuropathic pain. Gastrointestinal: Negative for abdominal pain, constipation, diarrhea, nausea and vomiting.  Genitourinary: Negative for bladder incontinence, difficulty urinating, dysuria, frequency and hematuria.   Musculoskeletal: Negative for back pain, gait problem, neck pain and neck stiffness.  Skin: Negative for itching and rash.  Neurological: Negative for dizziness, extremity weakness, gait problem, headaches, light-headedness and seizures.  Hematological: Negative for adenopathy. Does not bruise/bleed easily.  Psychiatric/Behavioral: Negative for confusion, depression and sleep disturbance. The patient is not nervous/anxious.     PHYSICAL EXAMINATION:  Blood pressure (!) 139/53, pulse 72, temperature 97.8 F (36.6 C), temperature source Oral, resp. rate 17, weight 152 lb 6 oz (69.1 kg), SpO2 99 %.  ECOG PERFORMANCE STATUS: 1 - Symptomatic but completely ambulatory  Physical Exam  Constitutional: Oriented to person, place, and time and well-developed, well-nourished, and in no distress. No distress.  HENT:  Head: Normocephalic and atraumatic.  Mouth/Throat: Oropharynx is clear and moist. No oropharyngeal exudate.  Eyes: Conjunctivae are normal. Right eye exhibits no discharge. Left eye exhibits no discharge. No scleral icterus.  Neck: Normal range of motion. Neck supple.  Cardiovascular: Normal rate, regular rhythm, normal heart sounds and intact distal pulses.   Pulmonary/Chest: Effort normal and breath sounds normal. No respiratory distress. No wheezes.  No rales.  Abdominal: Soft. Bowel sounds are normal. Exhibits no distension and no mass. There is no tenderness.  Musculoskeletal: Normal range of motion. Exhibits no edema.  Lymphadenopathy:    No cervical adenopathy.  Neurological: Alert and oriented to person, place, and time. Exhibits normal muscle tone. Gait normal. Coordination normal.  Skin: Skin is warm and dry. No rash noted. Not diaphoretic. No erythema. No pallor.  Psychiatric: Mood, memory and judgment normal.  Vitals reviewed.  LABORATORY DATA: Lab Results  Component Value Date   WBC 6.7 03/15/2018   HGB 10.9 (L) 03/15/2018   HCT 34.3 (L) 03/15/2018   MCV 84.3 03/15/2018   PLT 328 03/15/2018      Chemistry      Component Value Date/Time   NA 142 03/15/2018 1037   NA 142 06/22/2017 1335   K 3.5 03/15/2018 1037   K 3.9 06/22/2017 1335   CL 106 03/15/2018 1037   CO2 28 03/15/2018 1037   CO2 26 06/22/2017 1335   BUN 16 03/15/2018 1037   BUN 13.6 06/22/2017 1335   CREATININE 0.75 03/15/2018 1037   CREATININE 0.7 06/22/2017 1335      Component Value Date/Time   CALCIUM 8.9 03/15/2018 1037   CALCIUM 9.4 06/22/2017 1335   ALKPHOS 101 03/15/2018 1037   ALKPHOS 93 06/22/2017 1335   AST  12 (L) 03/15/2018 1037   AST 12 06/22/2017 1335   ALT 11 03/15/2018 1037   ALT 15 06/22/2017 1335   BILITOT 0.4 03/15/2018 1037   BILITOT 0.25 06/22/2017 1335       RADIOGRAPHIC STUDIES:  No results found.   ASSESSMENT/PLAN:  Adenocarcinoma of left lung, stage 2 ([place]) This is a very pleasant 62 year old white female recently diagnosed with unresectable a stage IIB non-small cell lung cancer, adenocarcinoma. The patient underwent a course of concurrent chemoradiation with weekly carboplatin and paclitaxel status post 6 cycles. She tolerated the treatment well and had partial response. She is currently undergoing consolidation treatment with immunotherapy with Imfinzi (Durvalumab) status post24cycles. The patient  continues to tolerate this treatment well. I recommended for her to proceed with cycle #25today as scheduled. I will see her back for follow-up visit in 2 weeks for evaluation with the start of cycle #26.  For her right chest neuropathic pain, I have refilled her Percocet today.  She was advised to call immediately if she has any concerning symptoms in the interval. The patient voices understanding of current disease status and treatment options and is in agreement with the current care plan. All questions were answered. The patient knows to call the clinic with any problems, questions or concerns. We can certainly see the patient much sooner if necessary.   No orders of the defined types were placed in this encounter.    Mikey Bussing, DNP, AGPCNP-BC, AOCNP 03/15/18

## 2018-03-15 NOTE — Patient Instructions (Signed)
[facility] Discharge Instructions for Patients Receiving Chemotherapy  Today you received the following chemotherapy agents Imfinzi.  To help prevent nausea and vomiting after your treatment, we encourage you to take your nausea medication as prescribed.   If you develop nausea and vomiting that is not controlled by your nausea medication, call the clinic.   BELOW ARE SYMPTOMS THAT SHOULD BE REPORTED IMMEDIATELY:  *FEVER GREATER THAN 100.5 F  *CHILLS WITH OR WITHOUT FEVER  NAUSEA AND VOMITING THAT IS NOT CONTROLLED WITH YOUR NAUSEA MEDICATION  *UNUSUAL SHORTNESS OF BREATH  *UNUSUAL BRUISING OR BLEEDING  TENDERNESS IN MOUTH AND THROAT WITH OR WITHOUT PRESENCE OF ULCERS  *URINARY PROBLEMS  *BOWEL PROBLEMS  UNUSUAL RASH Items with * indicate a potential emergency and should be followed up as soon as possible.  Feel free to call the clinic should you have any questions or concerns. The clinic phone number is [phone].  Please show the [place] at check-in to the Emergency Department and triage nurse.

## 2018-03-29 ENCOUNTER — Inpatient Hospital Stay: Payer: Medicare Other | Attending: Nurse Practitioner

## 2018-03-29 ENCOUNTER — Encounter: Payer: Self-pay | Admitting: Internal Medicine

## 2018-03-29 ENCOUNTER — Inpatient Hospital Stay: Payer: Medicare Other

## 2018-03-29 ENCOUNTER — Inpatient Hospital Stay (HOSPITAL_BASED_OUTPATIENT_CLINIC_OR_DEPARTMENT_OTHER): Payer: Medicare Other | Admitting: Internal Medicine

## 2018-03-29 ENCOUNTER — Telehealth: Payer: Self-pay | Admitting: Internal Medicine

## 2018-03-29 VITALS — BP 145/58 | HR 70 | Temp 98.1°F | Resp 20 | Ht 62.0 in | Wt 153.2 lb

## 2018-03-29 DIAGNOSIS — I10 Essential (primary) hypertension: Secondary | ICD-10-CM | POA: Insufficient documentation

## 2018-03-29 DIAGNOSIS — C3492 Malignant neoplasm of unspecified part of left bronchus or lung: Secondary | ICD-10-CM

## 2018-03-29 DIAGNOSIS — Z7982 Long term (current) use of aspirin: Secondary | ICD-10-CM | POA: Insufficient documentation

## 2018-03-29 DIAGNOSIS — E785 Hyperlipidemia, unspecified: Secondary | ICD-10-CM | POA: Insufficient documentation

## 2018-03-29 DIAGNOSIS — C3490 Malignant neoplasm of unspecified part of unspecified bronchus or lung: Secondary | ICD-10-CM

## 2018-03-29 DIAGNOSIS — Z5112 Encounter for antineoplastic immunotherapy: Secondary | ICD-10-CM | POA: Insufficient documentation

## 2018-03-29 DIAGNOSIS — R0981 Nasal congestion: Secondary | ICD-10-CM | POA: Diagnosis not present

## 2018-03-29 DIAGNOSIS — J449 Chronic obstructive pulmonary disease, unspecified: Secondary | ICD-10-CM | POA: Diagnosis not present

## 2018-03-29 DIAGNOSIS — Z79899 Other long term (current) drug therapy: Secondary | ICD-10-CM | POA: Insufficient documentation

## 2018-03-29 DIAGNOSIS — C3412 Malignant neoplasm of upper lobe, left bronchus or lung: Secondary | ICD-10-CM

## 2018-03-29 DIAGNOSIS — Z8673 Personal history of transient ischemic attack (TIA), and cerebral infarction without residual deficits: Secondary | ICD-10-CM | POA: Diagnosis not present

## 2018-03-29 DIAGNOSIS — Z95828 Presence of other vascular implants and grafts: Secondary | ICD-10-CM

## 2018-03-29 DIAGNOSIS — Z9221 Personal history of antineoplastic chemotherapy: Secondary | ICD-10-CM

## 2018-03-29 DIAGNOSIS — Z923 Personal history of irradiation: Secondary | ICD-10-CM | POA: Insufficient documentation

## 2018-03-29 DIAGNOSIS — C3411 Malignant neoplasm of upper lobe, right bronchus or lung: Secondary | ICD-10-CM

## 2018-03-29 LAB — CBC WITH DIFFERENTIAL (CANCER CENTER ONLY)
Abs Immature Granulocytes: 0.02 10*3/uL (ref 0.00–0.07)
Basophils Absolute: 0.1 10*3/uL (ref 0.0–0.1)
Basophils Relative: 1 %
Eosinophils Absolute: 0.1 10*3/uL (ref 0.0–0.5)
Eosinophils Relative: 2 %
HCT: 33 % — ABNORMAL LOW (ref 36.0–46.0)
HEMOGLOBIN: 10.4 g/dL — AB (ref 12.0–15.0)
Immature Granulocytes: 0 %
LYMPHS PCT: 20 %
Lymphs Abs: 1 10*3/uL (ref 0.7–4.0)
MCH: 26.6 pg (ref 26.0–34.0)
MCHC: 31.5 g/dL (ref 30.0–36.0)
MCV: 84.4 fL (ref 80.0–100.0)
MONOS PCT: 10 %
Monocytes Absolute: 0.5 10*3/uL (ref 0.1–1.0)
NEUTROS ABS: 3.6 10*3/uL (ref 1.7–7.7)
Neutrophils Relative %: 67 %
Platelet Count: 324 10*3/uL (ref 150–400)
RBC: 3.91 MIL/uL (ref 3.87–5.11)
RDW: 14.8 % (ref 11.5–15.5)
WBC Count: 5.3 10*3/uL (ref 4.0–10.5)
nRBC: 0 % (ref 0.0–0.2)

## 2018-03-29 LAB — CMP (CANCER CENTER ONLY)
ALK PHOS: 95 U/L (ref 38–126)
ALT: 8 U/L (ref 0–44)
AST: 12 U/L — AB (ref 15–41)
Albumin: 3.5 g/dL (ref 3.5–5.0)
Anion gap: 9 (ref 5–15)
BUN: 21 mg/dL — AB (ref 6–20)
CALCIUM: 9.1 mg/dL (ref 8.9–10.3)
CO2: 27 mmol/L (ref 22–32)
CREATININE: 0.67 mg/dL (ref 0.44–1.00)
Chloride: 108 mmol/L (ref 98–111)
Glucose, Bld: 90 mg/dL (ref 70–99)
Potassium: 3.6 mmol/L (ref 3.5–5.1)
SODIUM: 144 mmol/L (ref 135–145)
Total Bilirubin: 0.3 mg/dL (ref 0.3–1.2)
Total Protein: 7 g/dL (ref 6.5–8.1)

## 2018-03-29 LAB — TSH: TSH: 2.638 u[IU]/mL (ref 0.308–3.960)

## 2018-03-29 MED ORDER — SODIUM CHLORIDE 0.9 % IV SOLN
10.0000 mg/kg | Freq: Once | INTRAVENOUS | Status: AC
  Administered 2018-03-29: 680 mg via INTRAVENOUS
  Filled 2018-03-29: qty 13.6

## 2018-03-29 MED ORDER — HEPARIN SOD (PORK) LOCK FLUSH 100 UNIT/ML IV SOLN
500.0000 [IU] | Freq: Once | INTRAVENOUS | Status: AC | PRN
  Administered 2018-03-29: 500 [IU]
  Filled 2018-03-29: qty 5

## 2018-03-29 MED ORDER — SODIUM CHLORIDE 0.9% FLUSH
10.0000 mL | INTRAVENOUS | Status: DC | PRN
  Administered 2018-03-29: 10 mL
  Filled 2018-03-29: qty 10

## 2018-03-29 MED ORDER — SODIUM CHLORIDE 0.9% FLUSH
10.0000 mL | Freq: Once | INTRAVENOUS | Status: AC
  Administered 2018-03-29: 10 mL
  Filled 2018-03-29: qty 10

## 2018-03-29 MED ORDER — SODIUM CHLORIDE 0.9 % IV SOLN
Freq: Once | INTRAVENOUS | Status: AC
  Administered 2018-03-29: 12:00:00 via INTRAVENOUS
  Filled 2018-03-29: qty 250

## 2018-03-29 NOTE — Patient Instructions (Signed)
[facility] Discharge Instructions for Patients Receiving Chemotherapy  Today you received the following chemotherapy agents Imfinzi.  To help prevent nausea and vomiting after your treatment, we encourage you to take your nausea medication as prescribed.   If you develop nausea and vomiting that is not controlled by your nausea medication, call the clinic.   BELOW ARE SYMPTOMS THAT SHOULD BE REPORTED IMMEDIATELY:  *FEVER GREATER THAN 100.5 F  *CHILLS WITH OR WITHOUT FEVER  NAUSEA AND VOMITING THAT IS NOT CONTROLLED WITH YOUR NAUSEA MEDICATION  *UNUSUAL SHORTNESS OF BREATH  *UNUSUAL BRUISING OR BLEEDING  TENDERNESS IN MOUTH AND THROAT WITH OR WITHOUT PRESENCE OF ULCERS  *URINARY PROBLEMS  *BOWEL PROBLEMS  UNUSUAL RASH Items with * indicate a potential emergency and should be followed up as soon as possible.  Feel free to call the clinic should you have any questions or concerns. The clinic phone number is [phone].  Please show the [facility] at check-in to the Emergency Department and triage nurse.

## 2018-03-29 NOTE — Telephone Encounter (Signed)
Scheduled appt per 10/9 los - gave patient AVS and calender per los.

## 2018-03-29 NOTE — Progress Notes (Signed)
[place] [phone]   [phone]) [phone]  OFFICE PROGRESS NOTE  Herrera, [name], MD [place] [address]  DIAGNOSIS: Stage IIB (T2b, N1, M0) non-small cell lung cancer, adenocarcinoma presented with large left upper lobe lung mass in addition to left hilar lymphadenopathy diagnosed in April 2018. Her previous workup was done at [facility] in [facility]. There was insufficient material to perform the molecular studies.  GUARDANT 360 Molecular studies: BRCA2 N326f. Negative for EGFR, ALK, ROS1, BRAF mutations.  PRIOR THERAPY:  A course of concurrent chemoradiation with weekly carboplatin for AUC of 2 and paclitaxel 45 MG/M2. First dose 01/24/2017. Status post 6 cycles.  CURRENT THERAPY: Consolidation immunotherapy with Imfinzi (Durvalumab) 10 mg/KG every 2 weeks status post 25 cycles.  INTERVAL HISTORY: Felicia Fullenwider89y.o. female returns to the clinic today for follow-up visit.  The patient is feeling fine with no specific complaints.  She continues to tolerate her treatment with Imfinzi (Durvalumab) fairly well.  She denied having any chest pain, shortness of breath, cough or hemoptysis.  She denied having any fever or chills.  She has no nausea, vomiting, diarrhea or constipation.  She denied having any headache or visual changes.  She is here today for evaluation before starting cycle #26 of her treatment.  MEDICAL HISTORY: Past Medical History:  Diagnosis Date  . Adenocarcinoma of left lung, stage 2 ([facility] 12/27/2016  . Cancer ([facility]    Lun cancer: Right Dx 2008, s/p lobectomy. Left Dx 09/2016  . COPD (chronic obstructive pulmonary disease) ([facility]   . Encounter for antineoplastic chemotherapy 12/27/2016  . History of radiation therapy 01/24/17-03/08/17   left lung 2 Gy in 30 fractions  . Hyperlipidemia   . Hypertension   . Stroke ([facility]     ALLERGIES:  has No Known Allergies.  MEDICATIONS:  Current  Outpatient Medications  Medication Sig Dispense Refill  . aspirin EC 81 MG tablet Take 81 mg by mouth daily.    .Marland Kitchenatorvastatin (LIPITOR) 40 MG tablet Take 1 tablet (40 mg total) by mouth daily. 90 tablet 3  . BREO ELLIPTA 200-25 MCG/INH AEPB USE 1 INHALATION BY MOUTH  DAILY 180 each 1  . fluticasone (FLONASE) 50 MCG/ACT nasal spray Place 1 spray into both nostrils 2 (two) times daily. 16 g 6  . levothyroxine (SYNTHROID, LEVOTHROID) 75 MCG tablet Take 1 tablet (75 mcg total) by mouth daily before breakfast. 90 tablet 3  . nystatin cream (MYCOSTATIN) Apply 1 application topically 2 (two) times daily. 30 g 0  . omeprazole (PRILOSEC) 20 MG capsule Take 1 capsule (20 mg total) by mouth daily. 90 capsule 3  . oxyCODONE-acetaminophen (PERCOCET/ROXICET) 5-325 MG tablet Take 1 tablet by mouth every 6 (six) hours as needed for severe pain. 30 tablet 0  . prochlorperazine (COMPAZINE) 10 MG tablet Take 1 tablet (10 mg total) by mouth every 6 (six) hours as needed for nausea or vomiting. (Patient not taking: Reported on 01/18/2018) 30 tablet 0   No current facility-administered medications for this visit.    Facility-Administered Medications Ordered in Other Visits  Medication Dose Route Frequency Provider Last Rate Last Dose  . sodium chloride flush (NS) 0.9 % injection 10 mL  10 mL Intracatheter PRN MCurt Bears MD   10 mL at 06/22/17 1747    SURGICAL HISTORY:  Past Surgical History:  Procedure Laterality Date  . BREAST BIOPSY     several. Denies Hx of breast  cancer.  . IR FLUORO GUIDE PORT INSERTION RIGHT  02/04/2017  . IR US GUIDE VASC ACCESS RIGHT  02/04/2017  . THORACOTOMY/LOBECTOMY Right 2008  . TONSILLECTOMY  1960    REVIEW OF SYSTEMS:  A comprehensive review of systems was negative.   PHYSICAL EXAMINATION: General appearance: alert, cooperative and no distress Head: Normocephalic, without obvious abnormality, atraumatic Neck: no adenopathy, no JVD, supple, symmetrical, trachea midline  and thyroid not enlarged, symmetric, no tenderness/mass/nodules Lymph nodes: Cervical, supraclavicular, and axillary nodes normal. Resp: clear to auscultation bilaterally Back: symmetric, no curvature. ROM normal. No CVA tenderness. Cardio: regular rate and rhythm, S1, S2 normal, no murmur, click, rub or gallop GI: soft, non-tender; bowel sounds normal; no masses,  no organomegaly Extremities: extremities normal, atraumatic, no cyanosis or edema  ECOG PERFORMANCE STATUS: 1 - Symptomatic but completely ambulatory  Blood pressure (!) 145/58, pulse 70, temperature 98.1 F (36.7 C), temperature source Oral, resp. rate 20, height '5\' 2"'$  (1.575 m), weight 153 lb 3.2 oz (69.5 kg), SpO2 95 %.  LABORATORY DATA: Lab Results  Component Value Date   WBC 5.3 03/29/2018   HGB 10.4 (L) 03/29/2018   HCT 33.0 (L) 03/29/2018   MCV 84.4 03/29/2018   PLT 324 03/29/2018      Chemistry      Component Value Date/Time   NA 142 03/15/2018 1037   NA 142 06/22/2017 1335   K 3.5 03/15/2018 1037   K 3.9 06/22/2017 1335   CL 106 03/15/2018 1037   CO2 28 03/15/2018 1037   CO2 26 06/22/2017 1335   BUN 16 03/15/2018 1037   BUN 13.6 06/22/2017 1335   CREATININE 0.75 03/15/2018 1037   CREATININE 0.7 06/22/2017 1335      Component Value Date/Time   CALCIUM 8.9 03/15/2018 1037   CALCIUM 9.4 06/22/2017 1335   ALKPHOS 101 03/15/2018 1037   ALKPHOS 93 06/22/2017 1335   AST 12 (L) 03/15/2018 1037   AST 12 06/22/2017 1335   ALT 11 03/15/2018 1037   ALT 15 06/22/2017 1335   BILITOT 0.4 03/15/2018 1037   BILITOT 0.25 06/22/2017 1335       RADIOGRAPHIC STUDIES: No results found.  ASSESSMENT AND PLAN: This is a very pleasant 62 years old white female recently diagnosed with unresectable a stage IIB non-small cell lung cancer, adenocarcinoma. The patient underwent a course of concurrent chemoradiation with weekly carboplatin and paclitaxel status post 6 cycles.  She tolerated the treatment well and had  partial response. She is currently undergoing consolidation treatment with immunotherapy with Imfinzi (Durvalumab) status post 25 cycles. The patient has been tolerating this consolidation treatment fairly well. I recommended for her to proceed with the last cycle #26 today. I will see her back for follow-up visit in 3 weeks for evaluation after repeating CT scan of the chest for restaging of her disease. The patient was advised to call immediately if she has any concerning symptoms in the interval. The patient voices understanding of current disease status and treatment options and is in agreement with the current care plan. All questions were answered. The patient knows to call the clinic with any problems, questions or concerns. We can certainly see the patient much sooner if necessary.  Disclaimer: This note was dictated with voice recognition software. Similar sounding words can inadvertently be transcribed and may not be corrected upon review.

## 2018-04-14 ENCOUNTER — Ambulatory Visit (HOSPITAL_COMMUNITY)
Admission: RE | Admit: 2018-04-14 | Discharge: 2018-04-14 | Disposition: A | Discharge status: Home / Self Care | Payer: Medicare Other | Source: Ambulatory Visit | Attending: Internal Medicine | Admitting: Internal Medicine

## 2018-04-14 DIAGNOSIS — C3490 Malignant neoplasm of unspecified part of unspecified bronchus or lung: Secondary | ICD-10-CM

## 2018-04-14 DIAGNOSIS — R918 Other nonspecific abnormal finding of lung field: Secondary | ICD-10-CM | POA: Insufficient documentation

## 2018-04-14 DIAGNOSIS — C3412 Malignant neoplasm of upper lobe, left bronchus or lung: Secondary | ICD-10-CM | POA: Diagnosis not present

## 2018-04-14 DIAGNOSIS — Z902 Acquired absence of lung [part of]: Secondary | ICD-10-CM | POA: Insufficient documentation

## 2018-04-14 DIAGNOSIS — J432 Centrilobular emphysema: Secondary | ICD-10-CM | POA: Diagnosis not present

## 2018-04-14 DIAGNOSIS — Z5112 Encounter for antineoplastic immunotherapy: Secondary | ICD-10-CM | POA: Diagnosis not present

## 2018-04-14 DIAGNOSIS — I700 Atherosclerosis of aorta: Secondary | ICD-10-CM | POA: Diagnosis not present

## 2018-04-14 MED ORDER — HEPARIN SOD (PORK) LOCK FLUSH 100 UNIT/ML IV SOLN
500.0000 [IU] | Freq: Once | INTRAVENOUS | Status: AC
  Administered 2018-04-14: 500 [IU] via INTRAVENOUS

## 2018-04-14 MED ORDER — SODIUM CHLORIDE 0.9 % IJ SOLN
INTRAMUSCULAR | Status: AC
  Filled 2018-04-14: qty 50

## 2018-04-14 MED ORDER — HEPARIN SOD (PORK) LOCK FLUSH 100 UNIT/ML IV SOLN
INTRAVENOUS | Status: AC
  Filled 2018-04-14: qty 5

## 2018-04-14 MED ORDER — IOHEXOL 300 MG/ML  SOLN
75.0000 mL | Freq: Once | INTRAMUSCULAR | Status: AC | PRN
  Administered 2018-04-14: 75 mL via INTRAVENOUS

## 2018-04-18 ENCOUNTER — Encounter: Payer: Self-pay | Admitting: Internal Medicine

## 2018-04-18 ENCOUNTER — Inpatient Hospital Stay (HOSPITAL_BASED_OUTPATIENT_CLINIC_OR_DEPARTMENT_OTHER): Payer: Medicare Other | Admitting: Internal Medicine

## 2018-04-18 ENCOUNTER — Telehealth: Payer: Self-pay | Admitting: Internal Medicine

## 2018-04-18 VITALS — BP 140/65 | HR 80 | Temp 97.5°F | Resp 20 | Ht 62.0 in | Wt 154.1 lb

## 2018-04-18 DIAGNOSIS — Z9221 Personal history of antineoplastic chemotherapy: Secondary | ICD-10-CM | POA: Diagnosis not present

## 2018-04-18 DIAGNOSIS — R0981 Nasal congestion: Secondary | ICD-10-CM

## 2018-04-18 DIAGNOSIS — C3490 Malignant neoplasm of unspecified part of unspecified bronchus or lung: Secondary | ICD-10-CM

## 2018-04-18 DIAGNOSIS — Z79899 Other long term (current) drug therapy: Secondary | ICD-10-CM | POA: Diagnosis not present

## 2018-04-18 DIAGNOSIS — Z5112 Encounter for antineoplastic immunotherapy: Secondary | ICD-10-CM | POA: Diagnosis not present

## 2018-04-18 DIAGNOSIS — Z7982 Long term (current) use of aspirin: Secondary | ICD-10-CM

## 2018-04-18 DIAGNOSIS — G580 Intercostal neuropathy: Secondary | ICD-10-CM

## 2018-04-18 DIAGNOSIS — Z8673 Personal history of transient ischemic attack (TIA), and cerebral infarction without residual deficits: Secondary | ICD-10-CM

## 2018-04-18 DIAGNOSIS — C3492 Malignant neoplasm of unspecified part of left bronchus or lung: Secondary | ICD-10-CM

## 2018-04-18 DIAGNOSIS — J449 Chronic obstructive pulmonary disease, unspecified: Secondary | ICD-10-CM

## 2018-04-18 DIAGNOSIS — I10 Essential (primary) hypertension: Secondary | ICD-10-CM | POA: Diagnosis not present

## 2018-04-18 DIAGNOSIS — Z923 Personal history of irradiation: Secondary | ICD-10-CM

## 2018-04-18 DIAGNOSIS — E785 Hyperlipidemia, unspecified: Secondary | ICD-10-CM

## 2018-04-18 DIAGNOSIS — J310 Chronic rhinitis: Secondary | ICD-10-CM

## 2018-04-18 DIAGNOSIS — C3412 Malignant neoplasm of upper lobe, left bronchus or lung: Secondary | ICD-10-CM | POA: Diagnosis not present

## 2018-04-18 MED ORDER — OXYCODONE-ACETAMINOPHEN 5-325 MG PO TABS: 1.0000 | ORAL_TABLET | Freq: Four times a day (QID) | ORAL | 0 refills | Status: DC | PRN

## 2018-04-18 NOTE — Telephone Encounter (Signed)
Gave pt avs and calendar  °

## 2018-04-18 NOTE — Progress Notes (Signed)
[place] [phone]   Fax:(336) [phone]  OFFICE PROGRESS NOTE  [name], [name], MD [place] [address]  DIAGNOSIS: Stage IIB (T2b, N1, M0) non-small cell lung cancer, adenocarcinoma presented with large left upper lobe lung mass in addition to left hilar lymphadenopathy diagnosed in April 2018. Her previous workup was done at [facility] in [facility]. There was insufficient material to perform the molecular studies.  GUARDANT 360 Molecular studies: BRCA2 N346f. Negative for EGFR, ALK, ROS1, BRAF mutations.  PRIOR THERAPY:  1) A course of concurrent chemoradiation with weekly carboplatin for AUC of 2 and paclitaxel 45 MG/M2. First dose 01/24/2017. Status post 6 cycles. 2) Consolidation immunotherapy with Imfinzi (Durvalumab) 10 mg/KG every 2 weeks status post 26 cycles.   CURRENT THERAPY: Observation.  INTERVAL HISTORY: Felicia Dickison62y.o. female returns to the clinic today for follow-up visit.  The patient is feeling fine today with no concerning complaints except for sinus congestion.  She denied having any current chest pain, shortness breath, cough or hemoptysis.  She denied having any weight loss or night sweats.  She has no nausea, vomiting, diarrhea or constipation.  She has no headache or visual changes.  She completed a course of consolidation treatment with immunotherapy and she tolerated her treatment well.  The patient had repeat CT scan of the chest performed recently and she is here for evaluation and discussion of her risk her results.  MEDICAL HISTORY: Past Medical History:  Diagnosis Date  . Adenocarcinoma of left lung, stage 2 ([facility] 12/27/2016  . Cancer ([facility]    Lun cancer: Right Dx 2008, s/p lobectomy. Left Dx 09/2016  . COPD (chronic obstructive pulmonary disease) ([facility]   . Encounter for antineoplastic chemotherapy 12/27/2016  . History of radiation therapy 01/24/17-03/08/17   left  lung 2 Gy in 30 fractions  . Hyperlipidemia   . Hypertension   . Stroke ([facility]     ALLERGIES:  has No Known Allergies.  MEDICATIONS:  Current Outpatient Medications  Medication Sig Dispense Refill  . aspirin EC 81 MG tablet Take 81 mg by mouth daily.    .Marland Kitchenatorvastatin (LIPITOR) 40 MG tablet Take 1 tablet (40 mg total) by mouth daily. 90 tablet 3  . BREO ELLIPTA 200-25 MCG/INH AEPB USE 1 INHALATION BY MOUTH  DAILY 180 each 1  . fluticasone (FLONASE) 50 MCG/ACT nasal spray Place 1 spray into both nostrils 2 (two) times daily. 16 g 6  . levothyroxine (SYNTHROID, LEVOTHROID) 75 MCG tablet Take 1 tablet (75 mcg total) by mouth daily before breakfast. 90 tablet 3  . nystatin cream (MYCOSTATIN) Apply 1 application topically 2 (two) times daily. 30 g 0  . omeprazole (PRILOSEC) 20 MG capsule Take 1 capsule (20 mg total) by mouth daily. 90 capsule 3  . oxyCODONE-acetaminophen (PERCOCET/ROXICET) 5-325 MG tablet Take 1 tablet by mouth every 6 (six) hours as needed for severe pain. 30 tablet 0  . prochlorperazine (COMPAZINE) 10 MG tablet Take 1 tablet (10 mg total) by mouth every 6 (six) hours as needed for nausea or vomiting. (Patient not taking: Reported on 01/18/2018) 30 tablet 0   No current facility-administered medications for this visit.    Facility-Administered Medications Ordered in Other Visits  Medication Dose Route Frequency Provider Last Rate Last Dose  . sodium chloride flush (NS) 0.9 % injection 10 mL  10 mL Intracatheter PRN MCurt Bears MD   10 mL at 06/22/17 1747  SURGICAL HISTORY:  Past Surgical History:  Procedure Laterality Date  . BREAST BIOPSY     several. Denies Hx of breast cancer.  . IR FLUORO GUIDE PORT INSERTION RIGHT  02/04/2017  . IR US GUIDE VASC ACCESS RIGHT  02/04/2017  . THORACOTOMY/LOBECTOMY Right 2008  . TONSILLECTOMY  1960    REVIEW OF SYSTEMS:  Constitutional: negative Eyes: negative Ears, nose, mouth, throat, and face: positive for nasal  congestion Respiratory: negative Cardiovascular: negative Gastrointestinal: negative Genitourinary:negative Integument/breast: negative Hematologic/lymphatic: negative Musculoskeletal:negative Neurological: negative Behavioral/Psych: negative Endocrine: negative Allergic/Immunologic: negative   PHYSICAL EXAMINATION: General appearance: alert, cooperative and no distress Head: Normocephalic, without obvious abnormality, atraumatic Neck: no adenopathy, no JVD, supple, symmetrical, trachea midline and thyroid not enlarged, symmetric, no tenderness/mass/nodules Lymph nodes: Cervical, supraclavicular, and axillary nodes normal. Resp: clear to auscultation bilaterally Back: symmetric, no curvature. ROM normal. No CVA tenderness. Cardio: regular rate and rhythm, S1, S2 normal, no murmur, click, rub or gallop GI: soft, non-tender; bowel sounds normal; no masses,  no organomegaly Extremities: extremities normal, atraumatic, no cyanosis or edema Neurologic: Alert and oriented X 3, normal strength and tone. Normal symmetric reflexes. Normal coordination and gait  ECOG PERFORMANCE STATUS: 1 - Symptomatic but completely ambulatory  Blood pressure 140/65, pulse 80, temperature (!) 97.5 F (36.4 C), temperature source Oral, resp. rate 20, height 5' 2" (1.575 m), weight 154 lb 1.6 oz (69.9 kg), SpO2 97 %.  LABORATORY DATA: Lab Results  Component Value Date   WBC 5.3 03/29/2018   HGB 10.4 (L) 03/29/2018   HCT 33.0 (L) 03/29/2018   MCV 84.4 03/29/2018   PLT 324 03/29/2018      Chemistry      Component Value Date/Time   NA 144 03/29/2018 0956   NA 142 06/22/2017 1335   K 3.6 03/29/2018 0956   K 3.9 06/22/2017 1335   CL 108 03/29/2018 0956   CO2 27 03/29/2018 0956   CO2 26 06/22/2017 1335   BUN 21 (H) 03/29/2018 0956   BUN 13.6 06/22/2017 1335   CREATININE 0.67 03/29/2018 0956   CREATININE 0.7 06/22/2017 1335      Component Value Date/Time   CALCIUM 9.1 03/29/2018 0956   CALCIUM  9.4 06/22/2017 1335   ALKPHOS 95 03/29/2018 0956   ALKPHOS 93 06/22/2017 1335   AST 12 (L) 03/29/2018 0956   AST 12 06/22/2017 1335   ALT 8 03/29/2018 0956   ALT 15 06/22/2017 1335   BILITOT 0.3 03/29/2018 0956   BILITOT 0.25 06/22/2017 1335       RADIOGRAPHIC STUDIES: Ct Chest W Contrast  Result Date: 04/17/2018 CLINICAL DATA:  Right pneumonectomy in 2008 for lung cancer. Stage IIB left upper lobe lung adenocarcinoma diagnosed April 2018 status post concurrent chemoradiation therapy. Ongoing consolidation immunotherapy. Restaging. EXAM: CT CHEST WITH CONTRAST TECHNIQUE: Multidetector CT imaging of the chest was performed during intravenous contrast administration. CONTRAST:  43m OMNIPAQUE IOHEXOL 300 MG/ML  SOLN COMPARISON:  12/19/2017 chest CT. FINDINGS: Cardiovascular: Normal heart size. No significant pericardial effusion/thickening. Atherosclerotic nonaneurysmal thoracic aorta. Stable top-normal caliber main pulmonary artery (3.2 cm diameter). Right internal jugular MediPort terminates in the lower third of the SVC. No central pulmonary emboli. Mediastinum/Nodes: No discrete thyroid nodules. Unremarkable esophagus. No pathologically enlarged axillary, mediastinal or hilar lymph nodes. Lungs/Pleura: Status post right pneumonectomy. Stable mild pleural thickening in the right pleural space without significant pleural effusion. No left pneumothorax. No left pleural effusion. Stable position of lower tracheal stent. There is sharply marginated perihilar consolidation in  the posterior left upper lobe with associated continued volume loss and distortion, most compatible with evolving postradiation change. No discrete measurable residual pulmonary nodule in this location. Subpleural 3 mm pulmonary nodule along the left major fissure (series 5/image 69) is stable. Posterior left lower lobe 4 mm solid pulmonary nodule (series 5/image 110) is decreased from 9 mm. Patchy centrilobular micronodularity  throughout the left lower lobe is resolved in some locations and increased in other locations compared to 12/19/2017 chest CT, most compatible with bronchiolitis. No new significant pulmonary nodules. Mild centrilobular emphysema. Upper abdomen: Tiny hiatal hernia. Musculoskeletal: No aggressive appearing focal osseous lesions. Stable post thoracotomy changes in the right ribs. Stable chronic mild T3 vertebral compression fracture. Moderate thoracic spondylosis. IMPRESSION: 1. Continued evolution of postradiation changes in the posterior parahilar left upper lobe. No residual measurable pulmonary nodule in this location. 2. Posterior left lower lobe pulmonary nodule is decreased. 3. No evidence of new or progressive metastatic disease in the chest. 4. Waxing and waning mild patchy centrilobular micronodularity in the left lower lobe is most compatible with recurrent mild bronchiolitis such as due to aspiration. 5. Stable postsurgical changes from right pneumonectomy with stable lower tracheal stent. Aortic Atherosclerosis (ICD10-I70.0) and Emphysema (ICD10-J43.9). Electronically Signed   By: Ilona Sorrel M.D.   On: 04/17/2018 10:16    ASSESSMENT AND PLAN: This is a very pleasant 62 years old white female recently diagnosed with unresectable a stage IIB non-small cell lung cancer, adenocarcinoma. The patient underwent a course of concurrent chemoradiation with weekly carboplatin and paclitaxel status post 6 cycles.  She tolerated the treatment well and had partial response. She completed a course of consolidation treatment with immunotherapy with Imfinzi (Durvalumab) status post 26 cycles.  She tolerated her treatment well with no concerning adverse effects. The patient had a repeat CT scan of the chest performed recently.  I personally and independently reviewed the scan and discussed the results with the patient today. Her scan showed no concerning findings for disease progression. I recommended for her to  continue on observation with repeat CT scan of the chest in 3 months. For pain management, I gave the patient refill for Percocet today. The patient was advised to call immediately if she has any concerning symptoms in the interval. The patient voices understanding of current disease status and treatment options and is in agreement with the current care plan. All questions were answered. The patient knows to call the clinic with any problems, questions or concerns. We can certainly see the patient much sooner if necessary.  Disclaimer: This note was dictated with voice recognition software. Similar sounding words can inadvertently be transcribed and may not be corrected upon review.

## 2018-04-24 ENCOUNTER — Other Ambulatory Visit: Payer: Self-pay | Admitting: Family Medicine

## 2018-05-15 ENCOUNTER — Encounter: Payer: Self-pay | Admitting: Family Medicine

## 2018-05-15 ENCOUNTER — Ambulatory Visit (INDEPENDENT_AMBULATORY_CARE_PROVIDER_SITE_OTHER): Payer: Medicare Other | Admitting: Family Medicine

## 2018-05-15 VITALS — BP 120/80 | HR 65 | Temp 97.6°F | Ht 62.0 in | Wt 155.4 lb

## 2018-05-15 DIAGNOSIS — L602 Onychogryphosis: Secondary | ICD-10-CM | POA: Diagnosis not present

## 2018-05-15 DIAGNOSIS — G894 Chronic pain syndrome: Secondary | ICD-10-CM | POA: Insufficient documentation

## 2018-05-15 DIAGNOSIS — L304 Erythema intertrigo: Secondary | ICD-10-CM | POA: Diagnosis not present

## 2018-05-15 DIAGNOSIS — C3492 Malignant neoplasm of unspecified part of left bronchus or lung: Secondary | ICD-10-CM | POA: Diagnosis not present

## 2018-05-15 MED ORDER — OXYCODONE-ACETAMINOPHEN 5-325 MG PO TABS: 1.0000 | ORAL_TABLET | Freq: Every day | ORAL | 0 refills | Status: DC | PRN

## 2018-05-15 MED ORDER — NYSTATIN-TRIAMCINOLONE 100000-0.1 UNIT/GM-% EX CREA: 1.0000 "application " | TOPICAL_CREAM | Freq: Two times a day (BID) | CUTANEOUS | 0 refills | Status: DC | PRN

## 2018-05-15 MED ORDER — CEPHALEXIN 500 MG PO CAPS: 500.0000 mg | ORAL_CAPSULE | Freq: Two times a day (BID) | ORAL | 0 refills | Status: AC

## 2018-05-15 NOTE — Assessment & Plan Note (Addendum)
She will continue following with oncologist, Dr. Julien Nordmann.  According to patient, she is supposed to follow every 3 months.   She has next appointment in 06/2018.

## 2018-05-15 NOTE — Progress Notes (Signed)
ACUTE VISIT   HPI:  Chief Complaint  Patient presents with  . Discuss medication refill    [name] is a 62 y.o. female, who is here today complaining of right thoracic pain, which she related to her history of lung cancer. She was first diagnosed with right lung cancer in 2008, underwent lobectomy. 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  She completed chemoradiation and immunotherapy.  She is requesting prescription for oxycodone-acetaminophen 5-325 mg, which she has taken for about 13 to 14 months for pain management. According to patient, medication was prescribed by her oncologist but because she completed chemo treatment and no longer following monthly, she was recommended to request prescription for her PCP. She denies side effect of medication. She usually takes medication at bedtime and for about 24 days out of the month. Pain is on right thoracic area lateral to right breast) on surgical site (scar). 80s sharp, constant, it is better in the morning and gets worse as the day goes. At night pain interferes with her sleep, 10/10. Pain is exacerbated by movement,deep breathing, and with certain activities during the day. Alleviated by rest. History of COPD, symptoms are stable.  -She is also complaining of left great toe fungal infection.  She is requesting oral treatment for fungal infection. She has had problem for several months.  She denies periungual erythema, drainage, or pain. She has not tried OTC medications.  -Also requesting a topical medication to help with skin erythema under and lateral to right breast. This problem seems to be worse during hot days, some.  Area has been tender for the past few days. She denies fever or chills. Pruritus and erythema. According to patient, nurse for [facility] visited recently and recommended a cream that is a "combination between ointment and powder).  She does  not know the name of this topical medication.    Review of Systems  Constitutional: Negative for activity change, appetite change, fatigue and fever.  HENT: Negative for mouth sores, nosebleeds and trouble swallowing.   Respiratory: Positive for cough and shortness of breath (exertional,stable.). Negative for wheezing.   Cardiovascular: Negative for chest pain, palpitations and leg swelling.  Gastrointestinal: Negative for abdominal pain, nausea and vomiting.       Negative for changes in bowel habits.  Genitourinary: Negative for decreased urine volume, dysuria and hematuria.  Musculoskeletal: Positive for arthralgias and gait problem.  Skin: Positive for rash. Negative for wound.  Allergic/Immunologic: Positive for environmental allergies.  Neurological: Negative for syncope, weakness and headaches.  Psychiatric/Behavioral: Positive for sleep disturbance. The patient is nervous/anxious.       Current Outpatient Medications on File Prior to Visit  Medication Sig Dispense Refill  . aspirin EC 81 MG tablet Take 81 mg by mouth daily.    Marland Kitchen atorvastatin (LIPITOR) 40 MG tablet Take 1 tablet (40 mg total) by mouth daily. 90 tablet 3  . atorvastatin (LIPITOR) 40 MG tablet TAKE ONE TABLET BY MOUTH DAILY 90 tablet 0  . BREO ELLIPTA 200-25 MCG/INH AEPB USE 1 INHALATION BY MOUTH  DAILY 180 each 1  . fluticasone (FLONASE) 50 MCG/ACT nasal spray Place 1 spray into both nostrils 2 (two) times daily. 16 g 6  . levothyroxine (SYNTHROID, LEVOTHROID) 75 MCG tablet Take 1 tablet (75 mcg total) by mouth daily before breakfast. 90 tablet 3  . nystatin cream (MYCOSTATIN) Apply 1 application topically 2 (two) times daily. 30 g 0  .  omeprazole (PRILOSEC) 20 MG capsule Take 1 capsule (20 mg total) by mouth daily. 90 capsule 3  . prochlorperazine (COMPAZINE) 10 MG tablet Take 1 tablet (10 mg total) by mouth every 6 (six) hours as needed for nausea or vomiting. 30 tablet 0   Current Facility-Administered  Medications on File Prior to Visit  Medication Dose Route Frequency Provider Last Rate Last Dose  . sodium chloride flush (NS) 0.9 % injection 10 mL  10 mL Intracatheter PRN Curt Bears, MD   10 mL at 06/22/17 1747     Past Medical History:  Diagnosis Date  . Adenocarcinoma of left lung, stage 2 ([place]) 12/27/2016  . Cancer [facility])    Lun cancer: Right Dx 2008, s/p lobectomy. Left Dx 09/2016  . COPD (chronic obstructive pulmonary disease) ([place])   . Encounter for antineoplastic chemotherapy 12/27/2016  . History of radiation therapy 01/24/17-03/08/17   left lung 2 Gy in 30 fractions  . Hyperlipidemia   . Hypertension   . Stroke [facility])    No Known Allergies  Social History   Socioeconomic History  . Marital status: Single    Spouse name: Not on file  . Number of children: Not on file  . Years of education: Not on file  . Highest education level: Not on file  Occupational History  . Occupation: Chief Strategy Officer  . Financial resource strain: Not on file  . Food insecurity:    Worry: Not on file    Inability: Not on file  . Transportation needs:    Medical: Not on file    Non-medical: Not on file  Tobacco Use  . Smoking status: Former Smoker    Packs/day: 1.00    Years: 30.00    Pack years: 30.00    Types: Cigarettes    Last attempt to quit: 06/21/2005    Years since quitting: 12.9  . Smokeless tobacco: Never Used  Substance and Sexual Activity  . Alcohol use: No  . Drug use: No  . Sexual activity: Not Currently  Lifestyle  . Physical activity:    Days per week: Not on file    Minutes per session: Not on file  . Stress: Not on file  Relationships  . Social connections:    Talks on phone: Not on file    Gets together: Not on file    Attends religious service: Not on file    Active member of club or organization: Not on file    Attends meetings of clubs or organizations: Not on file    Relationship status: Not on file  Other Topics Concern  . 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     Vitals:   05/15/18 1258  BP: 120/80  Pulse: 65  Temp: 97.6 F (36.4 C)  SpO2: 97%   Body mass index is 28.42 kg/m.   Physical Exam  Nursing note and vitals reviewed. Constitutional: She is oriented to person, place, and time. She appears well-developed. No distress.  HENT:  Head: Normocephalic and atraumatic.  Mouth/Throat: Oropharynx is clear and moist and mucous membranes are normal.  Eyes: Pupils are equal, round, and reactive to light. Conjunctivae are normal.  Cardiovascular: Normal rate and regular rhythm.  No murmur heard. Respiratory: Effort normal and breath sounds normal. No respiratory distress.  GI: Soft. She exhibits no mass. There is no hepatomegaly. There is no tenderness.  Musculoskeletal: She exhibits no edema.  Tenderness upon palpation  of right lateral thoracic area.  Lymphadenopathy:    She has no cervical adenopathy.  Neurological: She is alert and oriented to person, place, and time. Gait abnormal.  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  Skin: Skin is warm. Rash noted. There is erythema.  Lateral aspect of right breast, on area of scaring changes (skin retractions) there is erythema,mild tenderness, minimal whitish exudate,no odorous.  Psychiatric: She has a normal mood and affect.  Well groomed, good eye contact.    ASSESSMENT AND PLAN:  [name] was seen today for discuss medication refill.  Diagnoses and all orders for this visit:  Hypertrophic toenail Explained that great toenail changes seem to be related to hypertrophic toenail trial done onychomycosis. Still explained that I do not recommend oral antimycotic medication due to possible side effects. Recommend applying Vick vapor rub daily at bedtime.  Chronic pain disorder [place] controlled substance was reviewed. She is taking medication just at night and tolerating well, so I will continue prescribing  oxycodone. We discussed some side effects. We will review current recommendations for chronic opioid treatment. Follow-up in a month, when we will sign medication contract.   Adenocarcinoma of left lung, stage 2 [facility]) She will continue following with oncologist, Dr. Julien Nordmann.  According to patient, she is supposed to follow every 3 months.   She has next appointment in 06/2018.   Intertrigo Educated about diagnosis, prognosis, and treatment options. Explained that I am not aware of any topical medication that combines of ointment and powder, she can request more information from [facility] provided. Recommend combination of nystatin and triamcinolone cream to apply twice daily as needed. She may have a mild underlying cellulitis, recommend 7 days course of Keflex. Recommend keeping area dry. Follow-up in a month.   Return in about 4 weeks (around 06/12/2018) for pain.     [name]. [name], MD  [facility]. [place] office.

## 2018-05-15 NOTE — Assessment & Plan Note (Signed)
[place] controlled substance was reviewed. She is taking medication just at night and tolerating well, so I will continue prescribing oxycodone. We discussed some side effects. We will review current recommendations for chronic opioid treatment. Follow-up in a month, when we will sign medication contract.

## 2018-05-15 NOTE — Assessment & Plan Note (Signed)
Educated about diagnosis, prognosis, and treatment options. Explained that I am not aware of any topical medication that combines of ointment and powder, she can request more information from [place] [facility] provided. Recommend combination of nystatin and triamcinolone cream to apply twice daily as needed. She may have a mild underlying cellulitis, recommend 7 days course of Keflex. Recommend keeping area dry. Follow-up in a month.

## 2018-05-15 NOTE — Patient Instructions (Addendum)
A few things to remember from today's visit:   Intertrigo  Adenocarcinoma of left lung, stage 2 ([place]) - Plan: oxyCODONE-acetaminophen (PERCOCET/ROXICET) 5-325 MG tablet  Hypertrophic toenail   Please be sure medication list is accurate. If a new problem present, please set up appointment sooner than planned today.

## 2018-05-31 ENCOUNTER — Telehealth: Payer: Self-pay | Admitting: Family Medicine

## 2018-05-31 NOTE — Telephone Encounter (Signed)
Received a call from [place]/Prescription coverage.  Nystatin-triamcinolone is not covered by insurance.  Alternative include splitting the medication into two different or prescribing clotrimazole, betamethasone or ketoconazole.  Please send in new rx.

## 2018-05-31 NOTE — Telephone Encounter (Signed)
Message sent to Dr. Jordan for review and approval. 

## 2018-06-02 ENCOUNTER — Other Ambulatory Visit: Payer: Self-pay | Admitting: Family Medicine

## 2018-06-02 MED ORDER — CLOTRIMAZOLE 1 % EX CREA: 1.0000 "application " | TOPICAL_CREAM | Freq: Two times a day (BID) | CUTANEOUS | 2 refills | Status: DC

## 2018-06-02 MED ORDER — BETAMETHASONE DIPROPIONATE 0.05 % EX CREA: TOPICAL_CREAM | Freq: Every day | CUTANEOUS | 2 refills | Status: AC | PRN

## 2018-06-02 NOTE — Telephone Encounter (Signed)
Prescription for clotrimazole and betamethasone sent to her local pharmacy. If this combination helps we can later send it to her mail order pharmacy.  Thanks, BJ

## 2018-06-12 ENCOUNTER — Ambulatory Visit (INDEPENDENT_AMBULATORY_CARE_PROVIDER_SITE_OTHER): Payer: Medicare Other | Admitting: Family Medicine

## 2018-06-12 ENCOUNTER — Encounter: Payer: Self-pay | Admitting: *Deleted

## 2018-06-12 ENCOUNTER — Encounter: Payer: Self-pay | Admitting: Family Medicine

## 2018-06-12 VITALS — BP 126/74 | HR 100 | Temp 98.0°F | Resp 12 | Ht 62.0 in | Wt 154.2 lb

## 2018-06-12 DIAGNOSIS — C3492 Malignant neoplasm of unspecified part of left bronchus or lung: Secondary | ICD-10-CM | POA: Diagnosis not present

## 2018-06-12 DIAGNOSIS — J449 Chronic obstructive pulmonary disease, unspecified: Secondary | ICD-10-CM | POA: Diagnosis not present

## 2018-06-12 DIAGNOSIS — G894 Chronic pain syndrome: Secondary | ICD-10-CM | POA: Diagnosis not present

## 2018-06-12 DIAGNOSIS — J310 Chronic rhinitis: Secondary | ICD-10-CM

## 2018-06-12 DIAGNOSIS — R6889 Other general symptoms and signs: Secondary | ICD-10-CM | POA: Diagnosis not present

## 2018-06-12 MED ORDER — OXYCODONE-ACETAMINOPHEN 5-325 MG PO TABS: 1.0000 | ORAL_TABLET | Freq: Every day | ORAL | 0 refills | Status: DC | PRN

## 2018-06-12 NOTE — Assessment & Plan Note (Signed)
She completed chemoradiation. Keep appointment with Dr. Earlie Server on 07/19/2018.

## 2018-06-12 NOTE — Assessment & Plan Note (Signed)
She is tolerating Percocet 5-325 mg well. Pain is is stable. Educated about side effects of opioid treatment as well as current recommendations in regard to chronic pain management. Medication contract signed today. Percocet 30 tablets sent to her pharmacy to be filled on 06/17/2017. Follow-up in 3 to 4 months, before if needed.

## 2018-06-12 NOTE — Assessment & Plan Note (Signed)
Educated about diagnosis and treatment options. Recommend saline nasal irrigation several times per day. Continue Flonase nasal spray daily as needed. OTC Zyrtec 10 mg may also help.

## 2018-06-12 NOTE — Patient Instructions (Addendum)
A few things to remember from today's visit:   Stage 2 moderate COPD by GOLD classification ([place])  Rhinitis, chronic  Chronic pain disorder  Adenocarcinoma of left lung, stage 2 ([place]) - Plan: oxyCODONE-acetaminophen (PERCOCET/ROXICET) 5-325 MG tablet For rhinitis I recommend nasal irrigation with saline several times per day. Over-the-counter Zyrtec 10 mg daily may also help. Continue Flonase nasal spray.   Chronic Pain, Adult Chronic pain is a type of pain that lasts or keeps coming back (recurs) for at least six months. You may have chronic headaches, abdominal pain, or body pain. Chronic pain may be related to an illness, such as fibromyalgia or complex regional pain syndrome. Sometimes the cause of chronic pain is not known. Chronic pain can make it hard for you to do daily activities. If not treated, chronic pain can lead to other health problems, including anxiety and depression. Treatment depends on the cause and severity of your pain. You may need to work with a pain specialist to come up with a treatment plan. The plan may include medicine, counseling, and physical therapy. Many people benefit from a combination of two or more types of treatment to control their pain. Follow these instructions at home: Lifestyle  Consider keeping a pain diary to share with your health care providers.  Consider talking with a mental health care provider (psychologist) about how to cope with chronic pain.  Consider joining a chronic pain support group.  Try to control or lower your stress levels. Talk to your health care provider about strategies to do this. General instructions   Take over-the-counter and prescription medicines only as told by your health care provider.  Follow your treatment plan as told by your health care provider. This may include: ? Gentle, regular exercise. ? Eating a healthy diet that includes foods such as vegetables, fruits, fish, and lean meats. ? Cognitive or  behavioral therapy. ? Working with a Community education officer. ? Meditation or yoga. ? Acupuncture or massage therapy. ? Aroma, color, light, or sound therapy. ? Local electrical stimulation. ? Shots (injections) of numbing or pain-relieving medicines into the spine or the area of pain.  Check your pain level as told by your health care provider. Ask your health care provider if you should use a pain scale.  Learn as much as you can about how to manage your chronic pain. Ask your health care provider if an intensive pain rehabilitation program or a chronic pain specialist would be helpful.  Keep all follow-up visits as told by your health care provider. This is important. Contact a health care provider if:  Your pain gets worse.  You have new pain.  You have trouble sleeping.  You have trouble doing your normal activities.  Your pain is not controlled with treatment.  Your have side effects from pain medicine.  You feel weak. Get help right away if:  You lose feeling or have numbness in your body.  You lose control of bowel or bladder function.  Your pain suddenly gets much worse.  You develop shaking or chills.  You develop confusion.  You develop chest pain.  You have trouble breathing or shortness of breath.  You pass out.  You have thoughts about hurting yourself or others. This information is not intended to replace advice given to you by your health care provider. Make sure you discuss any questions you have with your health care provider. Document Released: 02/27/2002 Document Revised: 02/05/2016 Document Reviewed: 11/25/2015 Elsevier Interactive Patient Education  2019 Elsevier  Inc.   Please be sure medication list is accurate. If a new problem present, please set up appointment sooner than planned today.

## 2018-06-12 NOTE — Progress Notes (Signed)
HPI:   [name] is a 62 y.o. female, who is here today to follow on recent OV.    She was last seen on 05/15/2018, when I took over her chronic pain management. Currently she is on oxycodone-acetaminophen 5-325 mg once daily as needed for pain. Medication was started by her oncologist. She completed chemotherapy for left lung adenocarcinoma. Right thoracic pain, localized below and posterior to right axilla, where she has scarring changes status post lobectomy.  Sharp constant pain that is very in the morning and worse in the afternoon, it interferes with her sleep. At night pain is 10/10, no radiated, exacerbated by certain movements and deep breathing and alleviated by rest. After taking the medication pain goes down to 7-8 over 10, which she can manage on enough to allow him to sleep.  She is tolerating medication well, denies side effects. Last refill for Percocet 5-325 mg was on 05/15/2017, #30 tablets. She has not had a urine tox recently.    Today she is complaining about "not feeling well." She is complaining about 4 weeks of intermittent nasal congestion, rhinorrhea, postnasal drainage, and productive cough with some sputum. She denies hemoptysis. Intermittent "clogged" ears, denies earache or changes in hearing. She has not identified exacerbating or alleviating factors.  History of COPD, she follows with pulmonologist and currently on [place] 200-25 mcg daily.  Also history of chronic rhinitis, she is on Flonase nasal spray. She denies fever but has noted chills. Her roommate was sick about 5 to 6 days ago. Denies wheezing, dyspnea, or unusual fatigue.   Review of Systems  Constitutional: Positive for fatigue. Negative for activity change, appetite change and fever.  HENT: Positive for congestion, postnasal drip and rhinorrhea. Negative for ear pain, mouth sores, sinus pressure, sore throat, trouble swallowing and voice change.   Eyes:  Negative for discharge and redness.  Respiratory: Positive for cough. Negative for shortness of breath and wheezing.   Gastrointestinal: Negative for abdominal pain, diarrhea, nausea and vomiting.  Musculoskeletal: Positive for arthralgias and myalgias. Negative for joint swelling.  Skin: Negative for rash.  Allergic/Immunologic: Positive for environmental allergies.  Neurological: Negative for weakness and headaches.  Psychiatric/Behavioral: The patient is nervous/anxious.       Current Outpatient Medications on File Prior to Visit  Medication Sig Dispense Refill  . aspirin EC 81 MG tablet Take 81 mg by mouth daily.    Marland Kitchen atorvastatin (LIPITOR) 40 MG tablet Take 1 tablet (40 mg total) by mouth daily. 90 tablet 3  . betamethasone dipropionate (DIPROLENE) 0.05 % cream Apply topically daily as needed. 30 g 2  . BREO ELLIPTA 200-25 MCG/INH AEPB USE 1 INHALATION BY MOUTH  DAILY 180 each 1  . clotrimazole (LOTRIMIN) 1 % cream Apply 1 application topically 2 (two) times daily. 30 g 2  . fluticasone (FLONASE) 50 MCG/ACT nasal spray Place 1 spray into both nostrils 2 (two) times daily. 16 g 6  . levothyroxine (SYNTHROID, LEVOTHROID) 75 MCG tablet Take 1 tablet (75 mcg total) by mouth daily before breakfast. 90 tablet 3  . nystatin cream (MYCOSTATIN) Apply 1 application topically 2 (two) times daily. 30 g 0  . omeprazole (PRILOSEC) 20 MG capsule Take 1 capsule (20 mg total) by mouth daily. 90 capsule 3  . prochlorperazine (COMPAZINE) 10 MG tablet Take 1 tablet (10 mg total) by mouth every 6 (six) hours as needed for nausea or vomiting. 30 tablet 0   Current Facility-Administered Medications on File  Prior to Visit  Medication Dose Route Frequency Provider Last Rate Last Dose  . sodium chloride flush (NS) 0.9 % injection 10 mL  10 mL Intracatheter PRN Curt Bears, MD   10 mL at 06/22/17 1747     Past Medical History:  Diagnosis Date  . Adenocarcinoma of left lung, stage 2 ([place]) 12/27/2016  .  Cancer [facility])    Lun cancer: Right Dx 2008, s/p lobectomy. Left Dx 09/2016  . COPD (chronic obstructive pulmonary disease) ([place])   . Encounter for antineoplastic chemotherapy 12/27/2016  . History of radiation therapy 01/24/17-03/08/17   left lung 2 Gy in 30 fractions  . Hyperlipidemia   . Hypertension   . Stroke [facility])    No Known Allergies  Social History   Socioeconomic History  . Marital status: Single    Spouse name: Not on file  . Number of children: Not on file  . Years of education: Not on file  . Highest education level: Not on file  Occupational History  . Occupation: Chief Strategy Officer  . Financial resource strain: Not on file  . Food insecurity:    Worry: Not on file    Inability: Not on file  . Transportation needs:    Medical: Not on file    Non-medical: Not on file  Tobacco Use  . Smoking status: Former Smoker    Packs/day: 1.00    Years: 30.00    Pack years: 30.00    Types: Cigarettes    Last attempt to quit: 06/21/2005    Years since quitting: 12.9  . Smokeless tobacco: Never Used  Substance and Sexual Activity  . Alcohol use: No  . Drug use: No  . Sexual activity: Not Currently  Lifestyle  . Physical activity:    Days per week: Not on file    Minutes per session: Not on file  . Stress: Not on file  Relationships  . Social connections:    Talks on phone: Not on file    Gets together: Not on file    Attends religious service: Not on file    Active member of club or organization: Not on file    Attends meetings of clubs or organizations: Not on file    Relationship status: Not on file  Other Topics Concern  . 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     Vitals:   06/12/18 1403  BP: 126/74  Pulse: 100  Resp: 12  Temp: 98 F (36.7 C)  SpO2: 96%   Body mass index is 28.21 kg/m.    Physical Exam  Nursing note and vitals reviewed. Constitutional: She is oriented to person, place, and time. She appears  well-developed. She does not appear ill. No distress.  HENT:  Head: Normocephalic and atraumatic.  Right Ear: Tympanic membrane, external ear and ear canal normal.  Left Ear: Tympanic membrane, external ear and ear canal normal.  Nose: Rhinorrhea present. Right sinus exhibits no maxillary sinus tenderness and no frontal sinus tenderness. Left sinus exhibits no maxillary sinus tenderness and no frontal sinus tenderness.  Mouth/Throat: Oropharynx is clear and moist and mucous membranes are normal.  Post nasal drainage.  Eyes: Conjunctivae are normal.  Cardiovascular: Normal rate and regular rhythm.  No murmur heard. Respiratory: Effort normal and breath sounds normal. No respiratory distress.  Musculoskeletal:     Comments: Pain upon palpation under right axilla, around scar s/o lobectomy. Muscle atrophy, contractions, limitation of movement  of joints of right upper extremity.  Lymphadenopathy:    She has no cervical adenopathy.  Neurological: She is alert and oriented to person, place, and time. She has normal strength.  Skin: Skin is warm. No rash noted. No erythema.  Psychiatric: Her mood appears anxious.  Well groomed, good eye contact.    ASSESSMENT AND PLAN:  Ms. Jacole was seen today for follow-up.  Diagnoses and all orders for this visit:   Adenocarcinoma of left lung, stage 2 ([place]) She completed chemoradiation. Keep appointment with Dr. Earlie Server on 07/19/2018.  Rhinitis, chronic Educated about diagnosis and treatment options. Recommend saline nasal irrigation several times per day. Continue Flonase nasal spray daily as needed. OTC Zyrtec 10 mg may also help.   Chronic pain disorder She is tolerating Percocet 5-325 mg well. Pain is is stable. Educated about side effects of opioid treatment as well as current recommendations in regard to chronic pain management. Medication contract signed today. Percocet 30 tablets sent to her pharmacy to be filled on  06/17/2017. Follow-up in 3 to 4 months, before if needed.  Stage 2 moderate COPD by GOLD classification ([place]) Cough she is reporting could also be related to COPD. No fever or changes in the sputum, so I do not think antibiotics at this time. No changes seen current management. Continue following with pulmonologist. Instructed about warning signs.       [name]. [name], MD  [facility]. [place] office.

## 2018-06-12 NOTE — Assessment & Plan Note (Signed)
Cough she is reporting could also be related to COPD. No fever or changes in the sputum, so I do not think antibiotics at this time. No changes seen current management. Continue following with pulmonologist. Instructed about warning signs.

## 2018-07-04 ENCOUNTER — Telehealth: Payer: Self-pay

## 2018-07-04 NOTE — Telephone Encounter (Signed)
Already moved (labs)per orders 1/14 sch msg

## 2018-07-17 ENCOUNTER — Other Ambulatory Visit: Payer: Self-pay | Admitting: Family Medicine

## 2018-07-17 ENCOUNTER — Encounter (HOSPITAL_COMMUNITY): Payer: Self-pay

## 2018-07-17 ENCOUNTER — Ambulatory Visit (HOSPITAL_COMMUNITY)
Admission: RE | Admit: 2018-07-17 | Discharge: 2018-07-17 | Disposition: A | Discharge status: Home / Self Care | Payer: Medicare Other | Source: Ambulatory Visit | Attending: Internal Medicine | Admitting: Internal Medicine

## 2018-07-17 ENCOUNTER — Inpatient Hospital Stay: Payer: Medicare Other | Attending: Nurse Practitioner

## 2018-07-17 DIAGNOSIS — C3412 Malignant neoplasm of upper lobe, left bronchus or lung: Secondary | ICD-10-CM | POA: Diagnosis not present

## 2018-07-17 DIAGNOSIS — Z923 Personal history of irradiation: Secondary | ICD-10-CM | POA: Insufficient documentation

## 2018-07-17 DIAGNOSIS — J189 Pneumonia, unspecified organism: Secondary | ICD-10-CM | POA: Diagnosis not present

## 2018-07-17 DIAGNOSIS — I10 Essential (primary) hypertension: Secondary | ICD-10-CM | POA: Insufficient documentation

## 2018-07-17 DIAGNOSIS — Z8673 Personal history of transient ischemic attack (TIA), and cerebral infarction without residual deficits: Secondary | ICD-10-CM | POA: Diagnosis not present

## 2018-07-17 DIAGNOSIS — Z79899 Other long term (current) drug therapy: Secondary | ICD-10-CM | POA: Insufficient documentation

## 2018-07-17 DIAGNOSIS — Z9221 Personal history of antineoplastic chemotherapy: Secondary | ICD-10-CM | POA: Insufficient documentation

## 2018-07-17 DIAGNOSIS — C3492 Malignant neoplasm of unspecified part of left bronchus or lung: Secondary | ICD-10-CM

## 2018-07-17 DIAGNOSIS — R0789 Other chest pain: Secondary | ICD-10-CM | POA: Diagnosis not present

## 2018-07-17 DIAGNOSIS — Z7982 Long term (current) use of aspirin: Secondary | ICD-10-CM | POA: Diagnosis not present

## 2018-07-17 DIAGNOSIS — C3490 Malignant neoplasm of unspecified part of unspecified bronchus or lung: Secondary | ICD-10-CM | POA: Insufficient documentation

## 2018-07-17 LAB — CBC WITH DIFFERENTIAL (CANCER CENTER ONLY)
Abs Immature Granulocytes: 0.02 10*3/uL (ref 0.00–0.07)
BASOS ABS: 0.1 10*3/uL (ref 0.0–0.1)
Basophils Relative: 1 %
EOS PCT: 1 %
Eosinophils Absolute: 0.1 10*3/uL (ref 0.0–0.5)
HEMATOCRIT: 34.3 % — AB (ref 36.0–46.0)
HEMOGLOBIN: 10.5 g/dL — AB (ref 12.0–15.0)
IMMATURE GRANULOCYTES: 0 %
LYMPHS ABS: 1.1 10*3/uL (ref 0.7–4.0)
LYMPHS PCT: 11 %
MCH: 25.4 pg — ABNORMAL LOW (ref 26.0–34.0)
MCHC: 30.6 g/dL (ref 30.0–36.0)
MCV: 83.1 fL (ref 80.0–100.0)
MONO ABS: 0.5 10*3/uL (ref 0.1–1.0)
Monocytes Relative: 5 %
NEUTROS ABS: 8 10*3/uL — AB (ref 1.7–7.7)
Neutrophils Relative %: 82 %
Platelet Count: 558 10*3/uL — ABNORMAL HIGH (ref 150–400)
RBC: 4.13 MIL/uL (ref 3.87–5.11)
RDW: 14.6 % (ref 11.5–15.5)
WBC: 9.7 10*3/uL (ref 4.0–10.5)
nRBC: 0 % (ref 0.0–0.2)

## 2018-07-17 LAB — CMP (CANCER CENTER ONLY)
ALBUMIN: 3.3 g/dL — AB (ref 3.5–5.0)
ALT: 11 U/L (ref 0–44)
AST: 11 U/L — ABNORMAL LOW (ref 15–41)
Alkaline Phosphatase: 99 U/L (ref 38–126)
Anion gap: 9 (ref 5–15)
BUN: 18 mg/dL (ref 8–23)
CHLORIDE: 104 mmol/L (ref 98–111)
CO2: 29 mmol/L (ref 22–32)
Calcium: 9.1 mg/dL (ref 8.9–10.3)
Creatinine: 0.75 mg/dL (ref 0.44–1.00)
GFR, Est AFR Am: >60 mL/min (ref >60)
Glucose, Bld: 92 mg/dL (ref 70–99)
POTASSIUM: 3.9 mmol/L (ref 3.5–5.1)
SODIUM: 142 mmol/L (ref 135–145)
Total Bilirubin: 0.3 mg/dL (ref 0.3–1.2)
Total Protein: 7.4 g/dL (ref 6.5–8.1)

## 2018-07-17 MED ORDER — HEPARIN SOD (PORK) LOCK FLUSH 100 UNIT/ML IV SOLN
500.0000 [IU] | Freq: Once | INTRAVENOUS | Status: AC
  Administered 2018-07-17: 500 [IU] via INTRAVENOUS

## 2018-07-17 MED ORDER — HEPARIN SOD (PORK) LOCK FLUSH 100 UNIT/ML IV SOLN
INTRAVENOUS | Status: AC
  Administered 2018-07-17: 500 [IU] via INTRAVENOUS
  Filled 2018-07-17: qty 5

## 2018-07-17 MED ORDER — SODIUM CHLORIDE (PF) 0.9 % IJ SOLN
INTRAMUSCULAR | Status: AC
  Filled 2018-07-17: qty 50

## 2018-07-17 MED ORDER — IOHEXOL 300 MG/ML  SOLN
75.0000 mL | Freq: Once | INTRAMUSCULAR | Status: AC | PRN
  Administered 2018-07-17: 75 mL via INTRAVENOUS

## 2018-07-17 NOTE — Telephone Encounter (Signed)
Copied from [place] [id number]. Topic: Quick Communication - Rx Refill/Question >> Jul 17, 2018 10:17 AM Ahmed Prima L wrote: Medication: oxyCODONE-acetaminophen (PERCOCET/ROXICET) 5-325 MG tablet  Has the patient contacted their pharmacy? Yes no refills (Agent: If no, request that the patient contact the pharmacy for the refill.) (Agent: If yes, when and what did the pharmacy advise?)  Preferred Pharmacy (with phone number or street name):  [facility] [address] [place] [address]    Agent: Please be advised that RX refills may take up to 3 business days. We ask that you follow-up with your pharmacy.

## 2018-07-18 ENCOUNTER — Telehealth: Payer: Self-pay | Admitting: *Deleted

## 2018-07-18 NOTE — Telephone Encounter (Signed)
Called pt to check on status as CT scan showing possible pneumonia symptoms, lmovm for pt to call back.

## 2018-07-19 ENCOUNTER — Encounter: Payer: Self-pay | Admitting: Internal Medicine

## 2018-07-19 ENCOUNTER — Inpatient Hospital Stay (HOSPITAL_BASED_OUTPATIENT_CLINIC_OR_DEPARTMENT_OTHER): Payer: Medicare Other | Admitting: Internal Medicine

## 2018-07-19 ENCOUNTER — Telehealth: Payer: Self-pay | Admitting: Internal Medicine

## 2018-07-19 VITALS — BP 125/73 | HR 85 | Temp 98.2°F | Resp 20 | Ht 62.0 in | Wt 153.9 lb

## 2018-07-19 DIAGNOSIS — Z7982 Long term (current) use of aspirin: Secondary | ICD-10-CM | POA: Diagnosis not present

## 2018-07-19 DIAGNOSIS — Z9221 Personal history of antineoplastic chemotherapy: Secondary | ICD-10-CM | POA: Diagnosis not present

## 2018-07-19 DIAGNOSIS — Z79899 Other long term (current) drug therapy: Secondary | ICD-10-CM | POA: Diagnosis not present

## 2018-07-19 DIAGNOSIS — J189 Pneumonia, unspecified organism: Secondary | ICD-10-CM

## 2018-07-19 DIAGNOSIS — G894 Chronic pain syndrome: Secondary | ICD-10-CM

## 2018-07-19 DIAGNOSIS — J690 Pneumonitis due to inhalation of food and vomit: Secondary | ICD-10-CM

## 2018-07-19 DIAGNOSIS — Z8673 Personal history of transient ischemic attack (TIA), and cerebral infarction without residual deficits: Secondary | ICD-10-CM | POA: Diagnosis not present

## 2018-07-19 DIAGNOSIS — Z923 Personal history of irradiation: Secondary | ICD-10-CM

## 2018-07-19 DIAGNOSIS — C3412 Malignant neoplasm of upper lobe, left bronchus or lung: Secondary | ICD-10-CM

## 2018-07-19 DIAGNOSIS — J181 Lobar pneumonia, unspecified organism: Secondary | ICD-10-CM

## 2018-07-19 DIAGNOSIS — C3490 Malignant neoplasm of unspecified part of unspecified bronchus or lung: Secondary | ICD-10-CM

## 2018-07-19 DIAGNOSIS — I10 Essential (primary) hypertension: Secondary | ICD-10-CM | POA: Diagnosis not present

## 2018-07-19 MED ORDER — LEVOFLOXACIN 500 MG PO TABS: 500.0000 mg | ORAL_TABLET | Freq: Every day | ORAL | 0 refills | Status: DC

## 2018-07-19 NOTE — Telephone Encounter (Signed)
Patient requesting refill, previously sent to wrong practice and refill denied.

## 2018-07-19 NOTE — Addendum Note (Signed)
Addended by: Dimple Nanas on: 07/19/2018 11:31 AM   Modules accepted: Orders

## 2018-07-19 NOTE — Progress Notes (Signed)
.[facility]      [facility] [phone]   [phone]) [phone]  OFFICE PROGRESS NOTE  [name], [name], MD [place] [address]  DIAGNOSIS: Stage IIB (T2b, N1, M0) non-small cell lung cancer, adenocarcinoma presented with large left upper lobe lung mass in addition to left hilar lymphadenopathy diagnosed in April 2018. Her previous workup was done at [facility] in [facility]. There was insufficient material to perform the molecular studies.  [facility] Molecular studies: BRCA2 N368f. Negative for EGFR, ALK, ROS1, BRAF mutations.  PRIOR THERAPY:  1) A course of concurrent chemoradiation with weekly carboplatin for AUC of 2 and paclitaxel 45 MG/M2. First dose 01/24/2017. Status post 6 cycles. 2) Consolidation immunotherapy with Imfinzi (Durvalumab) 10 mg/KG every 2 weeks status post 26 cycles.   CURRENT THERAPY: Observation.  INTERVAL HISTORY: Felicia Dantuono63y.o. female returns to the clinic today for follow-up visit.  The patient is complaining of cough and left-sided chest pain started 3 weeks ago.  She did not seek any medical attention since that time.  She thought that she broke her rib because of the cough.  She denied having any recent fever or chills.  She has no hemoptysis.  She denied having any nausea, vomiting, diarrhea or constipation.  She denied having any headache or visual changes.  She has no weight loss or night sweats.  She has been on observation for the last 3 months.  The patient had repeat CT scan of the chest performed recently and she is here for evaluation and discussion of her scan results.  MEDICAL HISTORY: Past Medical History:  Diagnosis Date  . [place]  . Cancer ([facility]    Lun cancer: Right Dx 2008, s/p lobectomy. Left Dx 09/2016  . [facility] [place] disease[place]   . Encounter for antineoplastic chemotherapy 12/27/2016  .  History of radiation therapy 01/24/17-03/08/17   left lung 2 Gy in 30 fractions  . Hyperlipidemia   . Hypertension   . Stroke ([facility]     ALLERGIES:  has No Known Allergies.  MEDICATIONS:  Current Outpatient Medications  Medication Sig Dispense Refill  . aspirin EC 81 MG tablet Take 81 mg by mouth daily.    .Marland Kitchenatorvastatin (LIPITOR) 40 MG tablet Take 1 tablet (40 mg total) by mouth daily. 90 tablet 3  . betamethasone dipropionate (DIPROLENE) 0.05 % cream Apply topically daily as needed. 30 g 2  . BREO ELLIPTA 200-25 MCG/INH AEPB USE 1 INHALATION BY MOUTH  DAILY 180 each 1  . clotrimazole (LOTRIMIN) 1 % cream Apply 1 application topically 2 (two) times daily. 30 g 2  . fluticasone (FLONASE) 50 MCG/ACT nasal spray Place 1 spray into both nostrils 2 (two) times daily. 16 g 6  . levothyroxine (SYNTHROID, LEVOTHROID) 75 MCG tablet Take 1 tablet (75 mcg total) by mouth daily before breakfast. 90 tablet 3  . nystatin cream (MYCOSTATIN) Apply 1 application topically 2 (two) times daily. 30 g 0  . omeprazole (PRILOSEC) 20 MG capsule Take 1 capsule (20 mg total) by mouth daily. 90 capsule 3  . oxyCODONE-acetaminophen (PERCOCET/ROXICET) 5-325 MG tablet Take 1 tablet by mouth daily as needed for severe pain. 30 tablet 0  . prochlorperazine (COMPAZINE) 10 MG tablet Take 1 tablet (10 mg total) by mouth every 6 (six) hours as needed for nausea or vomiting. 30 tablet 0   No current facility-administered medications for this visit.  Facility-Administered Medications Ordered in Other Visits  Medication Dose Route Frequency Provider Last Rate Last Dose  . sodium chloride flush (NS) 0.9 % injection 10 mL  10 mL Intracatheter PRN Curt Bears, MD   10 mL at 06/22/17 1747    SURGICAL HISTORY:  Past Surgical History:  Procedure Laterality Date  . BREAST BIOPSY     several. Denies Hx of breast cancer.  . IR FLUORO GUIDE PORT INSERTION RIGHT  02/04/2017  . IR US GUIDE VASC ACCESS RIGHT  02/04/2017  .  THORACOTOMY/LOBECTOMY Right 2008  . TONSILLECTOMY  1960    REVIEW OF SYSTEMS:  Constitutional: positive for fatigue Eyes: negative Ears, nose, mouth, throat, and face: negative Respiratory: positive for cough and pleurisy/chest pain Cardiovascular: negative Gastrointestinal: negative Genitourinary:negative Integument/breast: negative Hematologic/lymphatic: negative Musculoskeletal:negative Neurological: negative Behavioral/Psych: negative Endocrine: negative Allergic/Immunologic: negative   PHYSICAL EXAMINATION: General appearance: alert, cooperative, fatigued and no distress Head: Normocephalic, without obvious abnormality, atraumatic Neck: no adenopathy, no JVD, supple, symmetrical, trachea midline and thyroid not enlarged, symmetric, no tenderness/mass/nodules Lymph nodes: Cervical, supraclavicular, and axillary nodes normal. Resp: diminished breath sounds LLL and dullness to percussion LLL Back: symmetric, no curvature. ROM normal. No CVA tenderness. Cardio: regular rate and rhythm, S1, S2 normal, no murmur, click, rub or gallop GI: soft, non-tender; bowel sounds normal; no masses,  no organomegaly Extremities: extremities normal, atraumatic, no cyanosis or edema Neurologic: Alert and oriented X 3, normal strength and tone. Normal symmetric reflexes. Normal coordination and gait  ECOG PERFORMANCE STATUS: 1 - Symptomatic but completely ambulatory  Blood pressure 125/73, pulse 85, temperature 98.2 F (36.8 C), temperature source Oral, resp. rate 20, height '5\' 2"'$  (1.575 m), weight 153 lb 14.4 oz (69.8 kg), SpO2 91 %.  LABORATORY DATA: Lab Results  Component Value Date   WBC 9.7 07/17/2018   HGB 10.5 (L) 07/17/2018   HCT 34.3 (L) 07/17/2018   MCV 83.1 07/17/2018   PLT 558 (H) 07/17/2018      Chemistry      Component Value Date/Time   NA 142 07/17/2018 1315   NA 142 06/22/2017 1335   K 3.9 07/17/2018 1315   K 3.9 06/22/2017 1335   CL 104 07/17/2018 1315   CO2 29  07/17/2018 1315   CO2 26 06/22/2017 1335   BUN 18 07/17/2018 1315   BUN 13.6 06/22/2017 1335   CREATININE 0.75 07/17/2018 1315   CREATININE 0.7 06/22/2017 1335      Component Value Date/Time   CALCIUM 9.1 07/17/2018 1315   CALCIUM 9.4 06/22/2017 1335   ALKPHOS 99 07/17/2018 1315   ALKPHOS 93 06/22/2017 1335   AST 11 (L) 07/17/2018 1315   AST 12 06/22/2017 1335   ALT 11 07/17/2018 1315   ALT 15 06/22/2017 1335   BILITOT 0.3 07/17/2018 1315   BILITOT 0.25 06/22/2017 1335       RADIOGRAPHIC STUDIES: Ct Chest W Contrast  Result Date: 07/17/2018 CLINICAL DATA:  Lung cancer diagnosed 2018. Chemotherapy and immunotherapy and radiation therapy complete. LEFT-sided chest pain. EXAM: CT CHEST WITH CONTRAST TECHNIQUE: Multidetector CT imaging of the chest was performed during intravenous contrast administration. CONTRAST:  70m OMNIPAQUE IOHEXOL 300 MG/ML  SOLN COMPARISON:  CT 04/14/2018 FINDINGS: Cardiovascular: Port in the anterior chest wall with tip in distal SVC. Coronary artery calcification and aortic atherosclerotic calcification. Mediastinum/Nodes: No axillary or supraclavicular adenopathy. No mediastinal adenopathy. Stent within the lower trachea is patent. No hilar adenopathy. Esophagus normal Lungs/Pleura: Volume loss consistent with RIGHT pneumonectomy. Tracheal stent is  expanded. Small amount of fluid in the RIGHT pleural space unchanged. The LEFT lung is hyperinflated with perihilar radiation change. Within the lower lobe branching lobar nodular pattern is progressed from comparison exam (image 82/7). Upper Abdomen: Limited view of the liver, kidneys, pancreas are unremarkable. Normal adrenal glands. Musculoskeletal: No aggressive osseous lesion IMPRESSION: 1. Increase in lobar branching nodular pattern airspace disease in the LEFT lower lobe. Favor PNEUMONIA OR ASPIRATION PNEUMONITIS. Recommend clinical correlation. 2. Stable postsurgical change consistent with RIGHT pneumonectomy. No  evidence of local recurrence. No mediastinal metastatic disease. These results will be called to the ordering clinician or representative by the Radiologist Assistant, and communication documented in the PACS or zVision Dashboard. Electronically Signed   By: Suzy Bouchard M.D.   On: 07/17/2018 16:26    ASSESSMENT AND PLAN: This is a very pleasant 63 years old white female recently diagnosed with unresectable a stage IIB non-small cell lung cancer, adenocarcinoma. The patient underwent a course of concurrent chemoradiation with weekly carboplatin and paclitaxel status post 6 cycles.  She tolerated the treatment well and had partial response. She completed a course of consolidation treatment with immunotherapy with Imfinzi (Durvalumab) status post 26 cycles.  She tolerated her treatment well with no concerning adverse effects. The patient is currently on observation and she is feeling fine. Repeat CT scan of the chest performed recently showed no concerning findings for disease progression but there was lobar airspace disease in the left lower lobe favoring pneumonia or aspiration pneumonitis. I personally and independently reviewed the scans and discussed the results with the patient today. I recommended for her to continue on observation regarding her lung cancer with repeat CT scan of the chest in 3 months. For the suspicious pneumonia in the left lower lobe, I will start the patient on Levaquin 500 mg p.o. daily for 10 days.  She was also advised to reach out to her primary care physician if no improvement in the next 1-2 weeks. The patient was advised to call immediately if she has any other concerning symptoms in the interval. The patient voices understanding of current disease status and treatment options and is in agreement with the current care plan. All questions were answered. The patient knows to call the clinic with any problems, questions or concerns. We can certainly see the patient much  sooner if necessary.  Disclaimer: This note was dictated with voice recognition software. Similar sounding words can inadvertently be transcribed and may not be corrected upon review.

## 2018-07-19 NOTE — Telephone Encounter (Signed)
Scheduled appt per 01/29 los. ° °Printed calendar and avs. °

## 2018-07-20 ENCOUNTER — Telehealth: Payer: Self-pay | Admitting: *Deleted

## 2018-07-20 NOTE — Telephone Encounter (Signed)
Returned call to pt regarding her pain medication. LMOVM for pt to call our office with additional information. Pt oxycodone last filled by PCP Dr. [name].

## 2018-07-21 ENCOUNTER — Telehealth: Payer: Self-pay | Admitting: *Deleted

## 2018-07-21 ENCOUNTER — Other Ambulatory Visit: Payer: Self-pay | Admitting: Family Medicine

## 2018-07-21 DIAGNOSIS — C3492 Malignant neoplasm of unspecified part of left bronchus or lung: Secondary | ICD-10-CM

## 2018-07-21 MED ORDER — OXYCODONE-ACETAMINOPHEN 5-325 MG PO TABS: 1.0000 | ORAL_TABLET | Freq: Every day | ORAL | 0 refills | Status: AC | PRN

## 2018-07-21 NOTE — Telephone Encounter (Signed)
Dr [name] took over pain management in 04/2018 Last filled 06/12/18 #30 with 0 refills.  Pt is upset because she called in 1/27 for the refill and it is now 1/31 and she has not gotten her refills. I advised that we would try and get this refilled asap and that I will call her back after talking to the provider.  Dr [name] is not in office today so I spoke with my Production assistant, radio and Dr Volanda Napoleon who is covering her box today.  Dr Caffie Pinto 5 tablets to get her through the weekend. Pt is to take 1 tablet daily prn pain.   Dr Caffie Pinto Rx through Orders Only encounter - sent to [facility] Pt aware that we will send this message to Dr [name] to okay Rx on Monday when she returns to office.    Dr [name], please advise. Thanks.

## 2018-07-21 NOTE — Telephone Encounter (Signed)
Received vm message from patient regarding her pain medicine.  In reviewing her records it looks as if she had percocet ordered for her on today, 07/21/18 for 5 tablets per Dr. Grier Mitts.  Pt was seen here by Dr. Julien Nordmann on 07/19/18 and is being treated for pneumonia.  Attempted call back to patient. No answer but was able to leave VM message for pt to call back at her convenience

## 2018-07-21 NOTE — Telephone Encounter (Signed)
Pt calling to check on status of refill. This was requested on 07/17/2018 I explained that the request did not make it to our office until 07/19/2018 and has been sent to Dr [name] to sign off on.   Please advise

## 2018-07-21 NOTE — Progress Notes (Signed)
Pt requesting refill on Percocet 5-325 mg.  Apparently request made a few days ago, but there may have been a delay in getting to pt's pcp.  As pt's pcp is currently out of the office, this provider will send in a limited refill for the next few days until pt can contact her pcp.  Grier Mitts, MD

## 2018-07-23 MED ORDER — OXYCODONE-ACETAMINOPHEN 5-325 MG PO TABS: 1.0000 | ORAL_TABLET | Freq: Every day | ORAL | 0 refills | Status: DC | PRN

## 2018-07-25 ENCOUNTER — Ambulatory Visit (INDEPENDENT_AMBULATORY_CARE_PROVIDER_SITE_OTHER): Payer: Medicare Other | Admitting: Family Medicine

## 2018-07-25 ENCOUNTER — Ambulatory Visit (INDEPENDENT_AMBULATORY_CARE_PROVIDER_SITE_OTHER): Payer: Medicare Other

## 2018-07-25 ENCOUNTER — Encounter: Payer: Self-pay | Admitting: Family Medicine

## 2018-07-25 VITALS — BP 118/80 | HR 99 | Temp 99.7°F | Resp 20 | Ht 62.0 in | Wt 154.4 lb

## 2018-07-25 DIAGNOSIS — G894 Chronic pain syndrome: Secondary | ICD-10-CM

## 2018-07-25 DIAGNOSIS — R0781 Pleurodynia: Secondary | ICD-10-CM | POA: Diagnosis not present

## 2018-07-25 DIAGNOSIS — R918 Other nonspecific abnormal finding of lung field: Secondary | ICD-10-CM | POA: Diagnosis not present

## 2018-07-25 DIAGNOSIS — J181 Lobar pneumonia, unspecified organism: Secondary | ICD-10-CM

## 2018-07-25 DIAGNOSIS — J189 Pneumonia, unspecified organism: Secondary | ICD-10-CM

## 2018-07-25 DIAGNOSIS — R233 Spontaneous ecchymoses: Secondary | ICD-10-CM | POA: Diagnosis not present

## 2018-07-25 NOTE — Progress Notes (Signed)
ACUTE VISIT   HPI:  Chief Complaint  Patient presents with  . Follow-up    dx with pneumonia 9 days ago, not getting any better    [name] is a 63 y.o. female with Hx of non-small cell lung cancer left lung and s/p right lobectomy in 2008 due to lung cancer,and COPD here today complaining of left low back severe pain, which she thinks is related to recently diagnosed pneumonia. She denies any trauma. Pain is constant, severe. She just completed 5 days treatment of Levaquin.  Posterior rib cage pain, 8/10,exacerbated by movement,deep breathing,and coughing. Alleviated by rest. She has had intermittent wheezing,no more SOB than usual. She has had intermittent chills and subjective fever. + Night sweats.  No sick contact or recent travel  Productive cough,denies hemoptysis.  01/15/19 CT chest with contrast: 1. Increase in lobar branching nodular pattern airspace disease in the LEFT lower lobe. Favor PNEUMONIA OR ASPIRATION PNEUMONITIS. Recommend clinical correlation. 2. Stable postsurgical change consistent with RIGHT pneumonectomy.No evidence of local recurrence. No mediastinal metastatic disease.  She is also upset because she does not have Percocet available at her pharmacy. Chronic right thoracic pain that developed after right lumpectomy. She take Percocet 5-325 mg daily as needed. Tolerating well,no side effects.   Review of Systems  Constitutional: Positive for activity change, appetite change, chills, fatigue and fever.  HENT: Negative for mouth sores, nosebleeds and sore throat.   Respiratory: Positive for cough, shortness of breath and wheezing.   Cardiovascular: Negative for chest pain, palpitations and leg swelling.  Gastrointestinal: Negative for abdominal pain, nausea and vomiting.       Negative for changes in bowel habits.  Genitourinary: Negative for decreased urine volume, dysuria and hematuria.  Musculoskeletal: Positive for back  pain.  Neurological: Negative for syncope and headaches.  Psychiatric/Behavioral: Negative for confusion. The patient is nervous/anxious.       Current Outpatient Medications on File Prior to Visit  Medication Sig Dispense Refill  . aspirin EC 81 MG tablet Take 81 mg by mouth daily.    Marland Kitchen atorvastatin (LIPITOR) 40 MG tablet Take 1 tablet (40 mg total) by mouth daily. 90 tablet 3  . betamethasone dipropionate (DIPROLENE) 0.05 % cream Apply topically daily as needed. 30 g 2  . BREO ELLIPTA 200-25 MCG/INH AEPB USE 1 INHALATION BY MOUTH  DAILY 180 each 1  . clotrimazole (LOTRIMIN) 1 % cream Apply 1 application topically 2 (two) times daily. 30 g 2  . fluticasone (FLONASE) 50 MCG/ACT nasal spray Place 1 spray into both nostrils 2 (two) times daily. 16 g 6  . levofloxacin (LEVAQUIN) 500 MG tablet Take 1 tablet (500 mg total) by mouth daily. 10 tablet 0  . levothyroxine (SYNTHROID, LEVOTHROID) 75 MCG tablet Take 1 tablet (75 mcg total) by mouth daily before breakfast. 90 tablet 3  . nystatin cream (MYCOSTATIN) Apply 1 application topically 2 (two) times daily. 30 g 0  . omeprazole (PRILOSEC) 20 MG capsule Take 1 capsule (20 mg total) by mouth daily. 90 capsule 3  . oxyCODONE-acetaminophen (PERCOCET/ROXICET) 5-325 MG tablet Take 1 tablet by mouth daily as needed for severe pain. 30 tablet 0  . prochlorperazine (COMPAZINE) 10 MG tablet Take 1 tablet (10 mg total) by mouth every 6 (six) hours as needed for nausea or vomiting. 30 tablet 0   Current Facility-Administered Medications on File Prior to Visit  Medication Dose Route Frequency Provider Last Rate Last Dose  . sodium chloride flush (NS)  0.9 % injection 10 mL  10 mL Intracatheter PRN Curt Bears, MD   10 mL at 06/22/17 1747     Past Medical History:  Diagnosis Date  . Adenocarcinoma of left lung, stage 2 ([place]) 12/27/2016  . Cancer [facility])    Lun cancer: Right Dx 2008, s/p lobectomy. Left Dx 09/2016  . COPD (chronic obstructive pulmonary  disease) ([place])   . Encounter for antineoplastic chemotherapy 12/27/2016  . History of radiation therapy 01/24/17-03/08/17   left lung 2 Gy in 30 fractions  . Hyperlipidemia   . Hypertension   . Stroke [facility])    No Known Allergies  Social History   Socioeconomic History  . Marital status: Single    Spouse name: Not on file  . Number of children: Not on file  . Years of education: Not on file  . Highest education level: Not on file  Occupational History  . Occupation: Chief Strategy Officer  . Financial resource strain: Not on file  . Food insecurity:    Worry: Not on file    Inability: Not on file  . Transportation needs:    Medical: Not on file    Non-medical: Not on file  Tobacco Use  . Smoking status: Former Smoker    Packs/day: 1.00    Years: 30.00    Pack years: 30.00    Types: Cigarettes    Last attempt to quit: 06/21/2005    Years since quitting: 13.1  . Smokeless tobacco: Never Used  Substance and Sexual Activity  . Alcohol use: No  . Drug use: No  . Sexual activity: Not Currently  Lifestyle  . Physical activity:    Days per week: Not on file    Minutes per session: Not on file  . Stress: Not on file  Relationships  . Social connections:    Talks on phone: Not on file    Gets together: Not on file    Attends religious service: Not on file    Active member of club or organization: Not on file    Attends meetings of clubs or organizations: Not on file    Relationship status: Not on file  Other Topics Concern  . 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     Vitals:   07/25/18 1537  BP: 118/80  Pulse: 99  Resp: 20  Temp: 99.7 F (37.6 C)  SpO2: 93%   Body mass index is 28.24 kg/m.  Physical Exam  Nursing note and vitals reviewed. Constitutional: She is oriented to person, place, and time. She appears well-developed. She does not appear ill. No distress.  HENT:  Head: Atraumatic.  Right Ear: Tympanic membrane,  external ear and ear canal normal.  Left Ear: Tympanic membrane, external ear and ear canal normal.  Nose: Rhinorrhea present. Right sinus exhibits no maxillary sinus tenderness and no frontal sinus tenderness. Left sinus exhibits no maxillary sinus tenderness and no frontal sinus tenderness.  Mouth/Throat: Oropharynx is clear and moist and mucous membranes are normal.  Eyes: Conjunctivae are normal.  Neck: No muscular tenderness present. No edema and no erythema present.  Cardiovascular: Normal rate and regular rhythm.  No murmur heard. Respiratory: Effort normal and breath sounds normal. No stridor. No respiratory distress.  Lymphadenopathy:    She has no cervical adenopathy.  Neurological: She is alert and oriented to person, place, and time. She has normal strength.  Skin: Skin is warm. Ecchymosis (Noted on left low  back, tender.) noted. No rash noted. No erythema.  On area of pain noted ecchymosis,tender upon palpation. See picture (taken after verbal consent).  Psychiatric: Her mood appears anxious. Her affect is blunt and labile.  Fairly groomed, good eye contact.        ASSESSMENT AND PLAN:   Ms. Sonna was seen today for follow-up.  Diagnoses and all orders for this visit:   Lab Results  Component Value Date   WBC 10.5 07/25/2018   HGB 11.6 (L) 07/25/2018   HCT 36.1 07/25/2018   MCV 81.0 07/25/2018   PLT 586.0 (H) 07/25/2018    Costal margin pain Given her Hx of lung and breast cancer, rib X ray ordered today. Avoid shallow breathing,recommend deep breathing few times per day to prevent atelectasis formation.  -     CBC with Differential/Platelet -     DG Ribs Unilateral Left; Future -     DG Ribs Unilateral Left  Pneumonia of left lower lobe due to infectious organism [facility]) She was treated with Levaquin x 5 days and left lung auscultation negative for rales. I prefer to hold on CXR results before starting a new abx. Albuterol inh 2 puff every 6 hours for a  week then as needed for wheezing or shortness of breath.  Instructed about warning signs.  -     CBC with Differential/Platelet -     DG Chest 2 View; Future -     DG Chest 2 View -     doxycycline (VIBRA-TABS) 100 MG tablet; Take 1 tablet (100 mg total) by mouth 2 (two) times daily for 7 days.  Spontaneous ecchymosis Denies Hx of trauma. Monitor for new symptoms or signs of bleeding.  -     CBC with Differential/Platelet  Chronic pain disorder Upset because I was not in the office when she called for Percocet refills. She received 5 tabs, still has one. She is due to pick up Rx for Percocet tomorrow. We discussed rules in regard to opioid meds. Recommend calling 2-3 days in advance for future refills.. Rx for Percocet is available at her pharmacy,fill date 07/26/18, we called to pharmacy ,so she can get refill today.   Return if symptoms worsen or fail to improve.      [name]. [name], MD  [facility]. [place] office.

## 2018-07-25 NOTE — Patient Instructions (Signed)
A few things to remember from today's visit:   Costal margin pain - Plan: CBC with Differential/Platelet, DG Ribs Unilateral Left  Pneumonia of left lower lobe due to infectious organism [facility]) - Plan: CBC with Differential/Platelet, DG Chest 2 View  I do not hear pneumonia. Monitor for rash.   Please be sure medication list is accurate. If a new problem present, please set up appointment sooner than planned today.

## 2018-07-26 ENCOUNTER — Encounter: Payer: Self-pay | Admitting: Family Medicine

## 2018-07-26 LAB — CBC WITH DIFFERENTIAL/PLATELET
BASOS ABS: 0.1 10*3/uL (ref 0.0–0.1)
Basophils Relative: 1.2 % (ref 0.0–3.0)
EOS ABS: 0.1 10*3/uL (ref 0.0–0.7)
Eosinophils Relative: 0.7 % (ref 0.0–5.0)
HCT: 36.1 % (ref 36.0–46.0)
HEMOGLOBIN: 11.6 g/dL — AB (ref 12.0–15.0)
Lymphocytes Relative: 10.3 % — ABNORMAL LOW (ref 12.0–46.0)
Lymphs Abs: 1.1 10*3/uL (ref 0.7–4.0)
MCHC: 32 g/dL (ref 30.0–36.0)
MCV: 81 fl (ref 78.0–100.0)
MONO ABS: 0.7 10*3/uL (ref 0.1–1.0)
Monocytes Relative: 6.8 % (ref 3.0–12.0)
Neutro Abs: 8.5 10*3/uL — ABNORMAL HIGH (ref 1.4–7.7)
Neutrophils Relative %: 81 % — ABNORMAL HIGH (ref 43.0–77.0)
PLATELETS: 586 10*3/uL — AB (ref 150.0–400.0)
RBC: 4.45 Mil/uL (ref 3.87–5.11)
RDW: 15.1 % (ref 11.5–15.5)
WBC: 10.5 10*3/uL (ref 4.0–10.5)

## 2018-07-26 MED ORDER — DOXYCYCLINE HYCLATE 100 MG PO TABS: 100.0000 mg | ORAL_TABLET | Freq: Two times a day (BID) | ORAL | 0 refills | Status: AC

## 2018-07-27 ENCOUNTER — Encounter: Payer: Self-pay | Admitting: Family Medicine

## 2018-08-03 ENCOUNTER — Telehealth: Payer: Self-pay | Admitting: Family Medicine

## 2018-08-03 IMAGING — XA IR FLUORO GUIDE CV LINE*R*
2 series · 5 of 5 positions shown · IV contrast (IODINE)
Comparison: none

CLINICAL DATA: Lung cancer, access for chemotherapy

[Series 1: dsa check · 3 of 17 frames shown]
[frame 3/17]
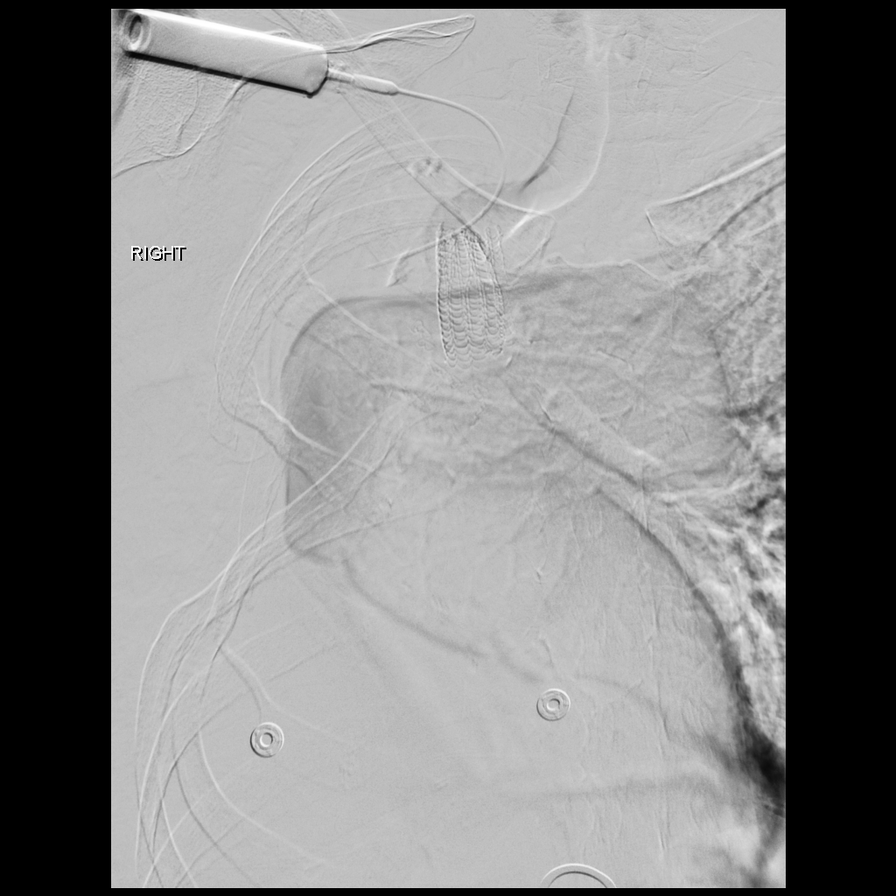
[frame 9/17]
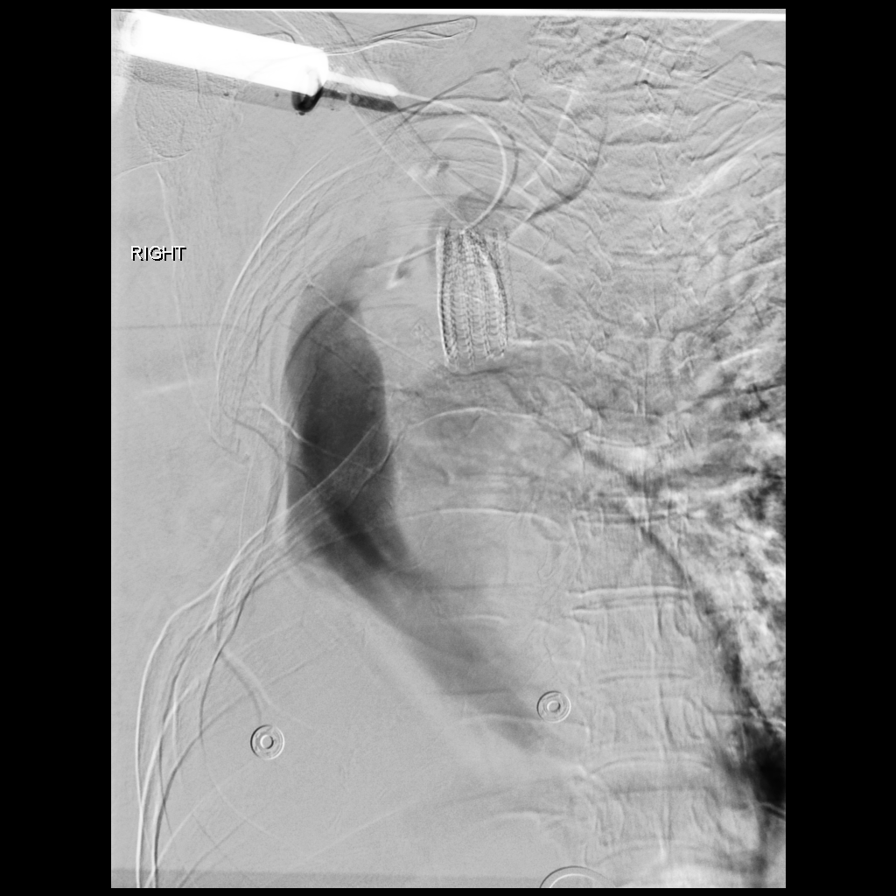
[frame 15/17]
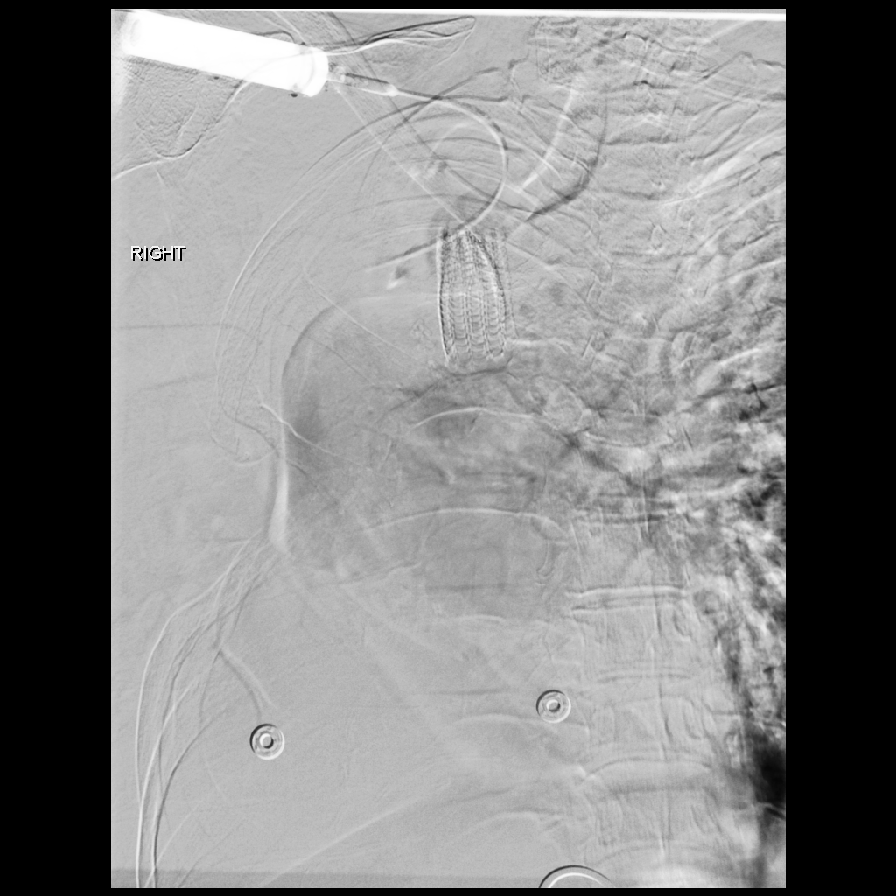

[Series 300: line placements · 2 of 2 slices shown]
[im 1/2]
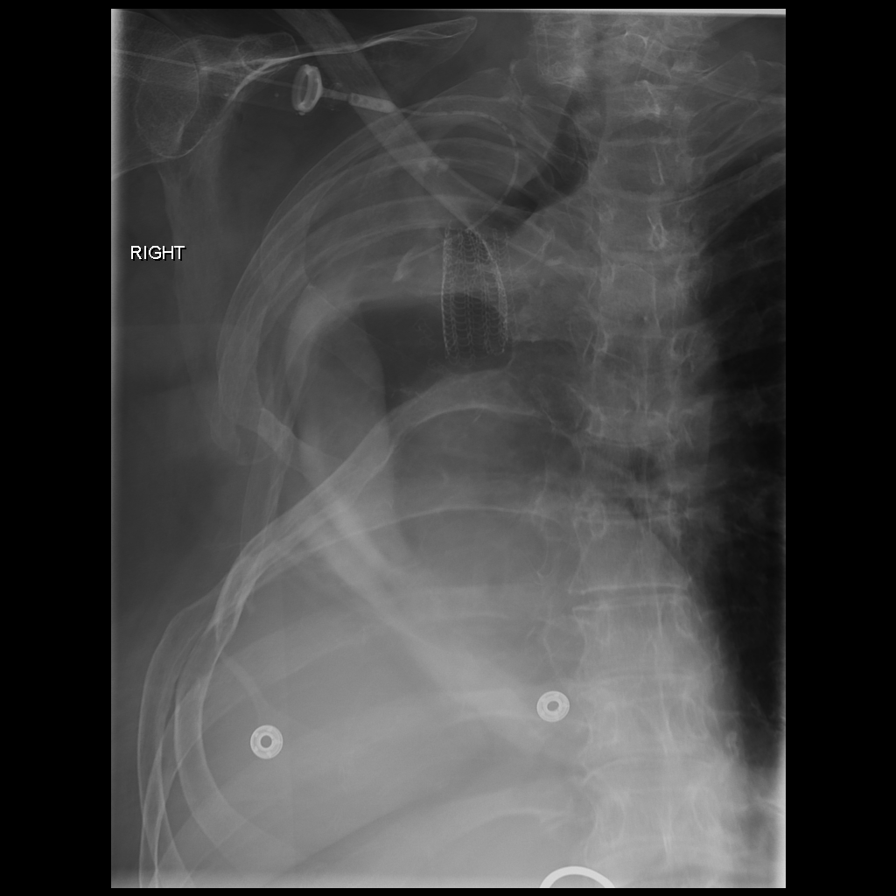
[im 2/2]
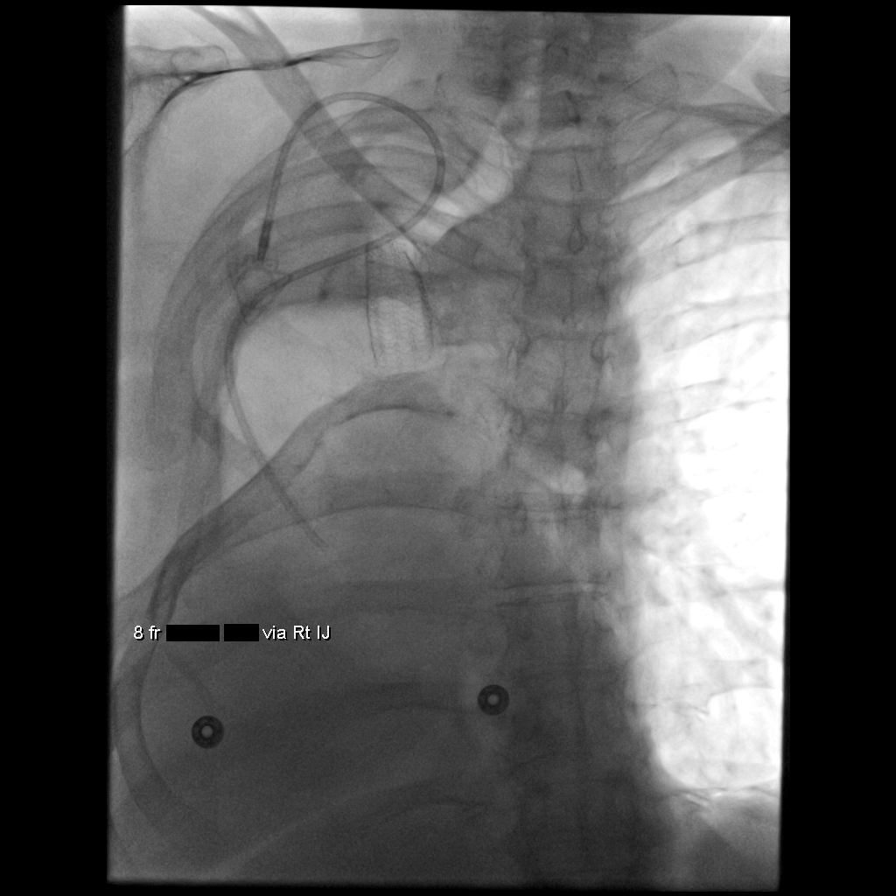

[5 of 5 positions shown; findings below may reference images not displayed]

EXAM:
RIGHT INTERNAL JUGULAR SINGLE LUMEN POWER PORT CATHETER INSERTION

Radiologist:  Thelusma, Virgen

Guidance:  Ultrasound and fluoroscopic

MEDICATIONS:
Ancef 2 g; The antibiotic was administered within an appropriate
time interval prior to skin puncture.

ANESTHESIA/SEDATION:
Versed 1.0 mg IV; Fentanyl 50 mcg IV;

Moderate Sedation Time:  30 minutes

The patient was continuously monitored during the procedure by the
interventional radiology nurse under my direct supervision.

FLUOROSCOPY TIME:  1 minutes, 48 seconds (10.5 mGy)

COMPLICATIONS:
None immediate.

CONTRAST:  5 cc

PROCEDURE:
Informed consent was obtained from the patient following explanation
of the procedure, risks, benefits and alternatives. The patient
understands, agrees and consents for the procedure. All questions
were addressed. A time out was performed.

Maximal barrier sterile technique utilized including caps, mask,
sterile gowns, sterile gloves, large sterile drape, hand hygiene,
and 2% chlorhexidine scrub.

Under sterile conditions and local anesthesia, right internal
jugular micropuncture venous access was performed. Access was
performed with ultrasound. Images were obtained for documentation.
Through the micro dilator, contrast injection performed for SVC
venogram because of the patient's distorted anatomy following
chronic right pneumonectomy. This allowed identification of the SVC
RA junction for adequate port catheter insertion.

A guide wire was inserted followed by a transitional dilator. This
allowed insertion of a guide wire and catheter into the IVC.
Measurements were obtained from the SVC / RA junction back to the
right IJ venotomy site. In the right infraclavicular chest, a
subcutaneous pocket was created over the second anterior rib. This
was done under sterile conditions and local anesthesia. 1% lidocaine
with epinephrine was utilized for this. A 2.5 cm incision was made
in the skin. Blunt dissection was performed to create a subcutaneous
pocket over the right pectoralis major muscle. The pocket was
flushed with saline vigorously. There was adequate hemostasis. The
port catheter was assembled and checked for leakage. The port
catheter was secured in the pocket with two retention sutures. The
tubing was tunneled subcutaneously to the right venotomy site and
inserted into the SVC/RA junction through a valved peel-away sheath.
Position was confirmed with fluoroscopy. Images were obtained for
documentation. The patient tolerated the procedure well. No
immediate complications. Incisions were closed in a two layer
fashion with 4 - 0 Vicryl suture. Dermabond was applied to the skin.
The port catheter was accessed, blood was aspirated followed by
saline and heparin flushes. Needle was removed. A dry sterile
dressing was applied.
IMPRESSION: Ultrasound and fluoroscopically guided right internal jugular single
lumen power port catheter insertion. Tip in the SVC/RA junction.
Catheter ready for use.

## 2018-08-03 IMAGING — US IR FLUORO GUIDE CV LINE*R*
1 series · 1 of 1 positions shown · non-contrast
Comparison: none

CLINICAL DATA: Lung cancer, access for chemotherapy

[Series 1: ir fluoro/shunt/fist · 1 of 1 slices shown]
[im 1/1]
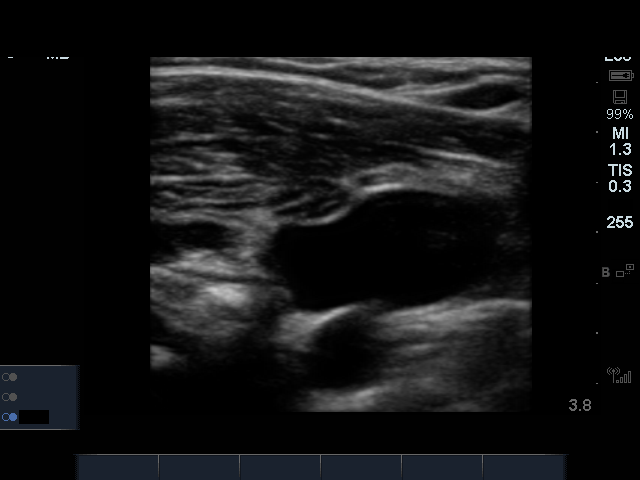

[1 of 1 positions shown; findings below may reference images not displayed]

EXAM:
RIGHT INTERNAL JUGULAR SINGLE LUMEN POWER PORT CATHETER INSERTION

Radiologist:  Thelusma, Virgen

Guidance:  Ultrasound and fluoroscopic

MEDICATIONS:
Ancef 2 g; The antibiotic was administered within an appropriate
time interval prior to skin puncture.

ANESTHESIA/SEDATION:
Versed 1.0 mg IV; Fentanyl 50 mcg IV;

Moderate Sedation Time:  30 minutes

The patient was continuously monitored during the procedure by the
interventional radiology nurse under my direct supervision.

FLUOROSCOPY TIME:  1 minutes, 48 seconds (10.5 mGy)

COMPLICATIONS:
None immediate.

CONTRAST:  5 cc

PROCEDURE:
Informed consent was obtained from the patient following explanation
of the procedure, risks, benefits and alternatives. The patient
understands, agrees and consents for the procedure. All questions
were addressed. A time out was performed.

Maximal barrier sterile technique utilized including caps, mask,
sterile gowns, sterile gloves, large sterile drape, hand hygiene,
and 2% chlorhexidine scrub.

Under sterile conditions and local anesthesia, right internal
jugular micropuncture venous access was performed. Access was
performed with ultrasound. Images were obtained for documentation.
Through the micro dilator, contrast injection performed for SVC
venogram because of the patient's distorted anatomy following
chronic right pneumonectomy. This allowed identification of the SVC
RA junction for adequate port catheter insertion.

A guide wire was inserted followed by a transitional dilator. This
allowed insertion of a guide wire and catheter into the IVC.
Measurements were obtained from the SVC / RA junction back to the
right IJ venotomy site. In the right infraclavicular chest, a
subcutaneous pocket was created over the second anterior rib. This
was done under sterile conditions and local anesthesia. 1% lidocaine
with epinephrine was utilized for this. A 2.5 cm incision was made
in the skin. Blunt dissection was performed to create a subcutaneous
pocket over the right pectoralis major muscle. The pocket was
flushed with saline vigorously. There was adequate hemostasis. The
port catheter was assembled and checked for leakage. The port
catheter was secured in the pocket with two retention sutures. The
tubing was tunneled subcutaneously to the right venotomy site and
inserted into the SVC/RA junction through a valved peel-away sheath.
Position was confirmed with fluoroscopy. Images were obtained for
documentation. The patient tolerated the procedure well. No
immediate complications. Incisions were closed in a two layer
fashion with 4 - 0 Vicryl suture. Dermabond was applied to the skin.
The port catheter was accessed, blood was aspirated followed by
saline and heparin flushes. Needle was removed. A dry sterile
dressing was applied.
IMPRESSION: Ultrasound and fluoroscopically guided right internal jugular single
lumen power port catheter insertion. Tip in the SVC/RA junction.
Catheter ready for use.

## 2018-08-03 NOTE — Telephone Encounter (Signed)
Copied from [place] [id number]. Topic: Quick Communication - See Telephone Encounter >> Aug 03, 2018 10:54 AM Ahmed Prima L wrote: CRM for notification. See Telephone encounter for: 08/03/18.  Patient states that she will be finishing up her Doxycycline 100mg  tonight for her pneumonia. She said she is still hurting on her left side and would like to know what Dr [name] wants her to do?

## 2018-08-04 ENCOUNTER — Encounter: Payer: Self-pay | Admitting: *Deleted

## 2018-08-04 NOTE — Telephone Encounter (Signed)
Patient informed of recommendations per Dr. [name].

## 2018-08-04 NOTE — Telephone Encounter (Signed)
I am not really certain her back pain is related to pneumonia.  She has tenderness upon palpation, he could be musculoskeletal.  She has been treated with antibiotics x2. At this time I recommend following with her pulmonologist, no for back pain but for pneumonia/ abnormal CXR follow-up. Thanks, BJ

## 2018-08-04 NOTE — Telephone Encounter (Signed)
Message sent to Dr. Jordan for review. Please advise 

## 2018-08-07 ENCOUNTER — Telehealth: Payer: Self-pay | Admitting: *Deleted

## 2018-08-07 ENCOUNTER — Inpatient Hospital Stay: Payer: Medicare Other | Attending: Nurse Practitioner | Admitting: *Deleted

## 2018-08-07 NOTE — Telephone Encounter (Addendum)
"  Felicia Herrera [phone]).  Was told I have Pneumonia.  Dr. [name] x-rayed me, refilled Doxycycline.  I don't know why but she is referring me back to Dr. Julien Nordmann.  Not sure if he needs to see me or not.  Leaving about 11:15 am to come there.  Seeing nutritionist; expecting to be there until three o'clock.    Completed Doxycycline 100 mg twice daily Saturday but feel the same.    Believe I've had fevers   I wake up sweating.  This happened last night.    Pain four inches above waist on my left side is still there.    I can feel and hear myself breathing in.  Yes, sounds like rice crispy crackles.  Become short of breath at times.  Not sure difference of rest and activity but when I go to move, I feel unsteady.  I'm taking it slow, staying at home a lot.  I normally would take walks."         Expecting call with instructions and or orders.  Advised to drink lots of water for hydration.

## 2018-08-07 NOTE — Telephone Encounter (Signed)
LM Dr Julien Nordmann said to follow up as previously scheduled unless she starts feeling worse

## 2018-08-23 ENCOUNTER — Other Ambulatory Visit: Payer: Self-pay | Admitting: Family Medicine

## 2018-08-23 ENCOUNTER — Other Ambulatory Visit: Payer: Self-pay | Admitting: Internal Medicine

## 2018-08-23 DIAGNOSIS — E039 Hypothyroidism, unspecified: Secondary | ICD-10-CM

## 2018-08-23 DIAGNOSIS — K219 Gastro-esophageal reflux disease without esophagitis: Secondary | ICD-10-CM

## 2018-08-23 DIAGNOSIS — E785 Hyperlipidemia, unspecified: Secondary | ICD-10-CM

## 2018-08-23 DIAGNOSIS — Z8709 Personal history of other diseases of the respiratory system: Secondary | ICD-10-CM

## 2018-08-25 ENCOUNTER — Other Ambulatory Visit: Payer: Self-pay | Admitting: Family Medicine

## 2018-08-25 ENCOUNTER — Telehealth: Payer: Self-pay

## 2018-08-25 DIAGNOSIS — C3492 Malignant neoplasm of unspecified part of left bronchus or lung: Secondary | ICD-10-CM

## 2018-08-25 MED ORDER — OXYCODONE-ACETAMINOPHEN 5-325 MG PO TABS: 1.0000 | ORAL_TABLET | Freq: Every day | ORAL | 0 refills | Status: DC | PRN

## 2018-08-25 NOTE — Telephone Encounter (Signed)
Pt called for refill request on Oxycodone. She states she called the pharmacy the other day and asked them to contact our office for a refill request. There is nothing in chart indicating a refill request. Pt reports she is out of medication and is very upset this has not been taken care of. Advised her we cannot control what the pharmacy does, but that I will put in a refill request for her today and mark it as urgent since she is out of medication.  Spoke with pharmacy. They advised they faxed a request several days ago. They did not send electronically. Pharmacist advised they fax their refills requests.

## 2018-08-25 NOTE — Telephone Encounter (Signed)
Message sent to Dr. [name] for review and approval. Last refill was 07/26/2018, #30, no refills

## 2018-08-25 NOTE — Telephone Encounter (Signed)
2 prescriptions for Percocet 5-325 mg was sent to her pharmacy. Please remind patient to arrange a follow-up appointment in 10/2018.  Thanks, BJ

## 2018-08-28 ENCOUNTER — Encounter: Payer: Self-pay | Admitting: *Deleted

## 2018-08-28 NOTE — Telephone Encounter (Signed)
Patient informed. 

## 2018-08-30 ENCOUNTER — Other Ambulatory Visit: Payer: Self-pay

## 2018-08-30 ENCOUNTER — Inpatient Hospital Stay: Payer: Medicare Other | Attending: Nurse Practitioner

## 2018-08-30 DIAGNOSIS — Z452 Encounter for adjustment and management of vascular access device: Secondary | ICD-10-CM | POA: Diagnosis not present

## 2018-08-30 DIAGNOSIS — Z9221 Personal history of antineoplastic chemotherapy: Secondary | ICD-10-CM | POA: Insufficient documentation

## 2018-08-30 DIAGNOSIS — C3412 Malignant neoplasm of upper lobe, left bronchus or lung: Secondary | ICD-10-CM | POA: Diagnosis not present

## 2018-08-30 DIAGNOSIS — Z95828 Presence of other vascular implants and grafts: Secondary | ICD-10-CM

## 2018-08-30 DIAGNOSIS — C3492 Malignant neoplasm of unspecified part of left bronchus or lung: Secondary | ICD-10-CM

## 2018-08-30 DIAGNOSIS — Z923 Personal history of irradiation: Secondary | ICD-10-CM | POA: Insufficient documentation

## 2018-08-30 MED ORDER — HEPARIN SOD (PORK) LOCK FLUSH 100 UNIT/ML IV SOLN
500.0000 [IU] | Freq: Once | INTRAVENOUS | Status: AC
  Administered 2018-08-30: 500 [IU]
  Filled 2018-08-30: qty 5

## 2018-08-30 MED ORDER — SODIUM CHLORIDE 0.9% FLUSH
10.0000 mL | Freq: Once | INTRAVENOUS | Status: AC
  Administered 2018-08-30: 10 mL
  Filled 2018-08-30: qty 10

## 2018-10-16 ENCOUNTER — Telehealth: Payer: Self-pay | Admitting: Internal Medicine

## 2018-10-16 ENCOUNTER — Ambulatory Visit (HOSPITAL_COMMUNITY): Admission: RE | Admit: 2018-10-16 | Payer: Medicare Other | Source: Ambulatory Visit

## 2018-10-16 ENCOUNTER — Encounter (HOSPITAL_COMMUNITY): Payer: Self-pay

## 2018-10-16 ENCOUNTER — Telehealth: Payer: Self-pay | Admitting: *Deleted

## 2018-10-16 ENCOUNTER — Inpatient Hospital Stay: Payer: Medicare Other

## 2018-10-16 ENCOUNTER — Other Ambulatory Visit: Payer: Self-pay

## 2018-10-16 ENCOUNTER — Ambulatory Visit (HOSPITAL_COMMUNITY)
Admission: RE | Admit: 2018-10-16 | Discharge: 2018-10-16 | Disposition: A | Discharge status: Home / Self Care | Payer: Medicare Other | Source: Ambulatory Visit | Attending: Internal Medicine | Admitting: Internal Medicine

## 2018-10-16 ENCOUNTER — Inpatient Hospital Stay: Payer: Medicare Other | Attending: Nurse Practitioner

## 2018-10-16 DIAGNOSIS — Z923 Personal history of irradiation: Secondary | ICD-10-CM | POA: Diagnosis not present

## 2018-10-16 DIAGNOSIS — C3492 Malignant neoplasm of unspecified part of left bronchus or lung: Secondary | ICD-10-CM

## 2018-10-16 DIAGNOSIS — Z9225 Personal history of immunosupression therapy: Secondary | ICD-10-CM | POA: Diagnosis not present

## 2018-10-16 DIAGNOSIS — I10 Essential (primary) hypertension: Secondary | ICD-10-CM | POA: Insufficient documentation

## 2018-10-16 DIAGNOSIS — Z452 Encounter for adjustment and management of vascular access device: Secondary | ICD-10-CM | POA: Insufficient documentation

## 2018-10-16 DIAGNOSIS — E785 Hyperlipidemia, unspecified: Secondary | ICD-10-CM | POA: Diagnosis not present

## 2018-10-16 DIAGNOSIS — J449 Chronic obstructive pulmonary disease, unspecified: Secondary | ICD-10-CM | POA: Diagnosis not present

## 2018-10-16 DIAGNOSIS — Z7982 Long term (current) use of aspirin: Secondary | ICD-10-CM | POA: Diagnosis not present

## 2018-10-16 DIAGNOSIS — Z8673 Personal history of transient ischemic attack (TIA), and cerebral infarction without residual deficits: Secondary | ICD-10-CM | POA: Diagnosis not present

## 2018-10-16 DIAGNOSIS — Z9221 Personal history of antineoplastic chemotherapy: Secondary | ICD-10-CM | POA: Diagnosis not present

## 2018-10-16 DIAGNOSIS — C3412 Malignant neoplasm of upper lobe, left bronchus or lung: Secondary | ICD-10-CM | POA: Insufficient documentation

## 2018-10-16 DIAGNOSIS — Z95828 Presence of other vascular implants and grafts: Secondary | ICD-10-CM

## 2018-10-16 DIAGNOSIS — Z79899 Other long term (current) drug therapy: Secondary | ICD-10-CM | POA: Insufficient documentation

## 2018-10-16 DIAGNOSIS — C3490 Malignant neoplasm of unspecified part of unspecified bronchus or lung: Secondary | ICD-10-CM | POA: Diagnosis not present

## 2018-10-16 LAB — CMP (CANCER CENTER ONLY)
ALT: 9 U/L (ref 0–44)
AST: 14 U/L — ABNORMAL LOW (ref 15–41)
Albumin: 3.4 g/dL — ABNORMAL LOW (ref 3.5–5.0)
Alkaline Phosphatase: 104 U/L (ref 38–126)
Anion gap: 10 (ref 5–15)
BUN: 15 mg/dL (ref 8–23)
CO2: 26 mmol/L (ref 22–32)
Calcium: 8.7 mg/dL — ABNORMAL LOW (ref 8.9–10.3)
Chloride: 106 mmol/L (ref 98–111)
Creatinine: 0.75 mg/dL (ref 0.44–1.00)
GFR, Est AFR Am: >60 mL/min (ref >60)
GFR, Estimated: >60 mL/min (ref >60)
Glucose, Bld: 76 mg/dL (ref 70–99)
Potassium: 3.8 mmol/L (ref 3.5–5.1)
Sodium: 142 mmol/L (ref 135–145)
Total Bilirubin: 0.3 mg/dL (ref 0.3–1.2)
Total Protein: 7.3 g/dL (ref 6.5–8.1)

## 2018-10-16 LAB — CBC WITH DIFFERENTIAL (CANCER CENTER ONLY)
Abs Immature Granulocytes: 0.01 10*3/uL (ref 0.00–0.07)
Basophils Absolute: 0.1 10*3/uL (ref 0.0–0.1)
Basophils Relative: 1 %
Eosinophils Absolute: 0.1 10*3/uL (ref 0.0–0.5)
Eosinophils Relative: 1 %
HCT: 34.7 % — ABNORMAL LOW (ref 36.0–46.0)
Hemoglobin: 10.5 g/dL — ABNORMAL LOW (ref 12.0–15.0)
Immature Granulocytes: 0 %
Lymphocytes Relative: 13 %
Lymphs Abs: 1.1 10*3/uL (ref 0.7–4.0)
MCH: 24.9 pg — ABNORMAL LOW (ref 26.0–34.0)
MCHC: 30.3 g/dL (ref 30.0–36.0)
MCV: 82.2 fL (ref 80.0–100.0)
Monocytes Absolute: 0.7 10*3/uL (ref 0.1–1.0)
Monocytes Relative: 8 %
Neutro Abs: 6.2 10*3/uL (ref 1.7–7.7)
Neutrophils Relative %: 77 %
Platelet Count: 434 10*3/uL — ABNORMAL HIGH (ref 150–400)
RBC: 4.22 MIL/uL (ref 3.87–5.11)
RDW: 15.9 % — ABNORMAL HIGH (ref 11.5–15.5)
WBC Count: 8.2 10*3/uL (ref 4.0–10.5)
nRBC: 0 % (ref 0.0–0.2)

## 2018-10-16 MED ORDER — SODIUM CHLORIDE 0.9% FLUSH
10.0000 mL | Freq: Once | INTRAVENOUS | Status: AC
  Administered 2018-10-16: 12:00:00 10 mL
  Filled 2018-10-16: qty 10

## 2018-10-16 MED ORDER — HEPARIN SOD (PORK) LOCK FLUSH 100 UNIT/ML IV SOLN
500.0000 [IU] | Freq: Once | INTRAVENOUS | Status: AC
  Administered 2018-10-16: 12:00:00 500 [IU]
  Filled 2018-10-16: qty 5

## 2018-10-16 MED ORDER — SODIUM CHLORIDE (PF) 0.9 % IJ SOLN
INTRAMUSCULAR | Status: AC
  Filled 2018-10-16: qty 50

## 2018-10-16 MED ORDER — IOHEXOL 300 MG/ML  SOLN
75.0000 mL | Freq: Once | INTRAMUSCULAR | Status: AC | PRN
  Administered 2018-10-16: 14:00:00 75 mL via INTRAVENOUS

## 2018-10-16 NOTE — Telephone Encounter (Signed)
Call received from [facility] with [place].  "Patient asking for port-a-cath to be used but LPN started IV.  May we use port-a-cath?"  Per EMR, no blood return obtained from port-a-cath which does mean unable to use.

## 2018-10-16 NOTE — Telephone Encounter (Signed)
Left voicemail for patient letting her know that she has transportation arranged for today's appointments.

## 2018-10-16 NOTE — Telephone Encounter (Signed)
Called regarding upcoming Webex appointment, left a voicemail and invite has been sent.

## 2018-10-18 ENCOUNTER — Encounter: Payer: Self-pay | Admitting: Internal Medicine

## 2018-10-18 ENCOUNTER — Inpatient Hospital Stay (HOSPITAL_BASED_OUTPATIENT_CLINIC_OR_DEPARTMENT_OTHER): Payer: Medicare Other | Admitting: Internal Medicine

## 2018-10-18 DIAGNOSIS — Z452 Encounter for adjustment and management of vascular access device: Secondary | ICD-10-CM | POA: Diagnosis not present

## 2018-10-18 DIAGNOSIS — Z9221 Personal history of antineoplastic chemotherapy: Secondary | ICD-10-CM

## 2018-10-18 DIAGNOSIS — Z8673 Personal history of transient ischemic attack (TIA), and cerebral infarction without residual deficits: Secondary | ICD-10-CM | POA: Diagnosis not present

## 2018-10-18 DIAGNOSIS — E785 Hyperlipidemia, unspecified: Secondary | ICD-10-CM | POA: Diagnosis not present

## 2018-10-18 DIAGNOSIS — Z7982 Long term (current) use of aspirin: Secondary | ICD-10-CM | POA: Diagnosis not present

## 2018-10-18 DIAGNOSIS — C3492 Malignant neoplasm of unspecified part of left bronchus or lung: Secondary | ICD-10-CM | POA: Diagnosis not present

## 2018-10-18 DIAGNOSIS — I10 Essential (primary) hypertension: Secondary | ICD-10-CM | POA: Diagnosis not present

## 2018-10-18 DIAGNOSIS — J449 Chronic obstructive pulmonary disease, unspecified: Secondary | ICD-10-CM | POA: Diagnosis not present

## 2018-10-18 DIAGNOSIS — C3490 Malignant neoplasm of unspecified part of unspecified bronchus or lung: Secondary | ICD-10-CM

## 2018-10-18 DIAGNOSIS — Z923 Personal history of irradiation: Secondary | ICD-10-CM

## 2018-10-18 DIAGNOSIS — Z9225 Personal history of immunosupression therapy: Secondary | ICD-10-CM | POA: Diagnosis not present

## 2018-10-18 DIAGNOSIS — Z79899 Other long term (current) drug therapy: Secondary | ICD-10-CM | POA: Diagnosis not present

## 2018-10-18 DIAGNOSIS — C3412 Malignant neoplasm of upper lobe, left bronchus or lung: Secondary | ICD-10-CM | POA: Diagnosis not present

## 2018-10-18 NOTE — Progress Notes (Signed)
Felicia [phone]   [phone]  PROGRESS NOTE FOR TELEMEDICINE VISITS  [name], [name], MD [address] [place] [address] [address]  I connected with@ on 10/18/18 at  9:45 AM EDT by video enabled telemedicine visit and verified that I am speaking with the correct person using two identifiers.   I discussed the limitations, risks, security and privacy concerns of performing an evaluation and management service by telemedicine and the availability of in-person appointments. I also discussed with the patient that there may be a patient responsible charge related to this service. The patient expressed understanding and agreed to proceed.  Other persons participating in the visit and their role in the encounter:  Felicia Herrera, thoracic navigator  Patient's location: Home Provider's location: [place]  DIAGNOSIS: Stage IIB(T2b, N1, M0) non-small cell lung cancer, adenocarcinoma presented with large left upper lobe lung mass in addition to left hilar lymphadenopathy diagnosed in April 2018. Her previous workup was done at [facility] in [facility]. There was insufficient material to perform the molecular studies.  [facility] Molecular studies: BRCA2 N339f. Negative for EGFR, ALK, ROS1, BRAF mutations.  PRIOR THERAPY:  1) A course of concurrent chemoradiation with weekly carboplatin for AUC of 2 and paclitaxel 45 MG/M2. First dose 01/24/2017. Status post 6 cycles. 2) Consolidation immunotherapy with Imfinzi (Durvalumab) 10 mg/KG every 2 weeks status post 26 cycles.   CURRENT THERAPY: Observation.  INTERVAL HISTORY: Felicia Likes89y.o. female has a WebEx virtual visit with me today for evaluation and discussion of her scan results.  The patient is feeling fine today with no concerning complaints.  She noticed improvement in her breathing and she denied having any significant chest pain, cough or  hemoptysis.  She denied having any recent weight loss or night sweats.  She has no nausea, vomiting, diarrhea or constipation.  She denied having any headache or visual changes.  The patient had repeat CT scan of the chest performed recently and we are having the visit for discussion of her scan results and recommendation regarding her condition.  MEDICAL HISTORY: Past Medical History:  Diagnosis Date   [place], [place] 2 [place] [place]   [place]    Lun cancer: Right Dx 2008, s/p lobectomy. Left Dx 09/2016   COPD (chronic obstructive pulmonary disease) [place]    Encounter for antineoplastic chemotherapy 12/27/2016   History of radiation therapy 01/24/17-03/08/17   left lung 2 Gy in 30 fractions   Hyperlipidemia    Hypertension    Stroke ([facility]     ALLERGIES:  has No Known Allergies.  MEDICATIONS:  Current Outpatient Medications  Medication Sig Dispense Refill   aspirin EC 81 MG tablet Take 81 mg by mouth daily.     atorvastatin (LIPITOR) 40 MG tablet TAKE 1 TABLET BY MOUTH  DAILY 90 tablet 3   betamethasone dipropionate (DIPROLENE) 0.05 % cream Apply topically daily as needed. 30 Herrera 2   BREO ELLIPTA 200-25 MCG/INH AEPB USE 1 INHALATION BY MOUTH  DAILY 180 each 1   clotrimazole (LOTRIMIN) 1 % cream Apply 1 application topically 2 (two) times daily. 30 Herrera 2   fluticasone (FLONASE) 50 MCG/ACT nasal spray Place 1 spray into both nostrils 2 (two) times daily. 16 Herrera 6   levofloxacin (LEVAQUIN) 500 MG tablet Take 1 tablet (500 mg total) by mouth daily. 10 tablet 0   levothyroxine (SYNTHROID, LEVOTHROID) 75 MCG tablet TAKE 1 TABLET BY MOUTH  DAILY BEFORE BREAKFAST 90 tablet 3   nystatin cream (MYCOSTATIN) Apply 1 application topically 2 (two) times daily. 30 Herrera 0   omeprazole (PRILOSEC) 20 MG capsule TAKE 1 CAPSULE BY MOUTH  DAILY 90 capsule 3   oxyCODONE-acetaminophen (PERCOCET/ROXICET) 5-325 MG tablet Take 1 tablet by mouth daily as needed for severe pain. 30  tablet 0   prochlorperazine (COMPAZINE) 10 MG tablet Take 1 tablet (10 mg total) by mouth every 6 (six) hours as needed for nausea or vomiting. 30 tablet 0   No current facility-administered medications for this visit.    Facility-Administered Medications Ordered in Other Visits  Medication Dose Route Frequency Provider Last Rate Last Dose   sodium chloride flush (NS) 0.9 % injection 10 mL  10 mL Intracatheter PRN Felicia Bears, MD   10 mL at 06/22/17 1747    SURGICAL HISTORY:  Past Surgical History:  Procedure Laterality Date   BREAST BIOPSY     several. Denies Hx of breast cancer.   IR FLUORO GUIDE PORT INSERTION RIGHT  02/04/2017   IR US GUIDE VASC ACCESS RIGHT  02/04/2017   THORACOTOMY/LOBECTOMY Right 2008   TONSILLECTOMY  1960    REVIEW OF SYSTEMS:  A comprehensive review of systems was negative except for: Respiratory: positive for dyspnea on exertion   LABORATORY DATA: Lab Results  Component Value Date   WBC 8.2 10/16/2018   HGB 10.5 (L) 10/16/2018   HCT 34.7 (L) 10/16/2018   MCV 82.2 10/16/2018   PLT 434 (H) 10/16/2018      Chemistry      Component Value Date/Time   NA 142 10/16/2018 1142   NA 142 06/22/2017 1335   K 3.8 10/16/2018 1142   K 3.9 06/22/2017 1335   CL 106 10/16/2018 1142   CO2 26 10/16/2018 1142   CO2 26 06/22/2017 1335   BUN 15 10/16/2018 1142   BUN 13.6 06/22/2017 1335   CREATININE 0.75 10/16/2018 1142   CREATININE 0.7 06/22/2017 1335      Component Value Date/Time   CALCIUM 8.7 (L) 10/16/2018 1142   CALCIUM 9.4 06/22/2017 1335   ALKPHOS 104 10/16/2018 1142   ALKPHOS 93 06/22/2017 1335   AST 14 (L) 10/16/2018 1142   AST 12 06/22/2017 1335   ALT 9 10/16/2018 1142   ALT 15 06/22/2017 1335   BILITOT 0.3 10/16/2018 1142   BILITOT 0.25 06/22/2017 1335       RADIOGRAPHIC STUDIES: Ct Chest W Contrast  Result Date: 10/16/2018 CLINICAL DATA:  Non-small cell lung cancer staging EXAM: CT CHEST WITH CONTRAST TECHNIQUE:  Multidetector CT imaging of the chest was performed during intravenous contrast administration. CONTRAST:  22m OMNIPAQUE IOHEXOL 300 MG/ML  SOLN COMPARISON:  07/17/2018 FINDINGS: Cardiovascular: Right Port-A-Cath tip: Lower SVC Aortic and branch vessel atherosclerotic calcifications. Mediastinum/Nodes: Marked rightward mediastinal shift due to right pneumonectomy. Tracheal stent extends down to the carina with patent opening between the stent in the left mainstem bronchus. The tracheal wall in the vicinity of the stent is somewhat thickened and indistinct, possibly from mild chronic underlying inflammatory process. Scattered small upper mediastinal lymph nodes are similar to prior. An anterior pericardial density measuring 0.9 cm in short axis on image 108/2 is stable and probably a lymph node. Lungs/Pleura: Left apical scarring, stable. Suprahilar airspace opacity in the left upper lobe measuring about 1.9 by 3.6 cm on image 40/8, essentially stable and previously attributed to prior radiation therapy. There is some truncation of upper lobe airways along this airspace opacity. In  the left lower lobe there are reticular and nodular opacities which in general are improved compared to the prior exam, with less consolidation. Some of these have a branching pattern characteristic of atypical infectious bronchiolitis. There is potentially underlying emphysema. There is some increase in nodularity posteriorly on image 106/7 measuring about 1.8 by 1.2 cm, probably from waxing and waning infiltrates in this vicinity, but meriting surveillance. Right pneumonectomy. Upper Abdomen: Contracted gallbladder. Musculoskeletal: Right thoracic deformities probably related to prior thoracotomy and pneumonectomy. Healing fractures of the left ninth and tenth ribs posterolaterally. The ninth rib demonstrates increased callus formation compared to from prior, and the left tenth rib fracture demonstrates increase in hyperemic  demineralization the fracture margins. These are most likely benign fractures based on current and previous appearance. Stable mild superior endplate compression at T3. Dextroconvex thoracic scoliosis. IMPRESSION: 1. Generally improved scattered nodular airspace opacities in the left lower lobe compatible with improving pneumonia. There are still some findings of atypical infectious bronchiolitis and some more confluent mild airspace opacity in the left lower lobe, but improved from previous. Surveillance is likely warranted. 2. Left suprahilar region of prior radiation therapy appear stable. 3. Right pneumonectomy.  Stable appearance of tracheal stent. 4. No overt pathologic adenopathy, although an anterior pericardial node is upper normal in size. 5. Healing fractures of the left ninth and tenth ribs posterolaterally, likely benign. Stable mild superior endplate compression at T3. 6.  Aortic Atherosclerosis (ICD10-I70.0). Electronically Signed   By: Van Clines M.D.   On: 10/16/2018 19:29    ASSESSMENT AND PLAN:  This is a very pleasant 63 years old white female recently diagnosed with unresectable a stage IIB non-small cell lung cancer, adenocarcinoma. The patient underwent a course of concurrent chemoradiation with weekly carboplatin and paclitaxel status post 6 cycles.  She tolerated the treatment well and had partial response. She completed a course of consolidation treatment with immunotherapy with Imfinzi (Durvalumab) status post 26 cycles.  She tolerated her treatment well with no concerning adverse effects. The patient is currently on observation and she is feeling fine. She had repeat CT scan of the chest performed recently.  I personally and independently reviewed the scans and discussed the results with the patient today. Her scan showed no concerning findings for disease progression. I recommended for her to continue on observation with repeat CT scan of the chest 3 months. She was  advised to call immediately if she has any concerning symptoms in the interval. I discussed the assessment and treatment plan with the patient. The patient was provided an opportunity to ask questions and all were answered. The patient agreed with the plan and demonstrated an understanding of the instructions.   The patient was advised to call back or seek an in-person evaluation if the symptoms worsen or if the condition fails to improve as anticipated.  I provided 15 minutes of face-to-face video visit time during this encounter, and > 50% was spent counseling as documented under my assessment & plan.  Eilleen Kempf, MD 10/18/2018 9:47 AM  Disclaimer: This note was dictated with voice recognition software. Similar sounding words can inadvertently be transcribed and may not be corrected upon review.

## 2018-10-19 ENCOUNTER — Telehealth: Payer: Self-pay | Admitting: Internal Medicine

## 2018-10-19 NOTE — Telephone Encounter (Signed)
Tried to reach regarding schedule °

## 2018-10-23 ENCOUNTER — Telehealth: Payer: Self-pay | Admitting: Family Medicine

## 2018-10-23 NOTE — Telephone Encounter (Signed)
Message sent to Dr. Jordan for review and approval. 

## 2018-10-23 NOTE — Telephone Encounter (Signed)
Copied from [place] [phone]. Topic: Quick Communication - Rx Refill/Question >> Oct 23, 2018 10:57 AM Reyne Dumas L wrote: Medication: oxyCODONE-acetaminophen (PERCOCET/ROXICET) 5-325 MG tablet  Has the patient contacted their pharmacy? yes (Agent: If no, request that the patient contact the pharmacy for the refill.) (Agent: If yes, when and what did the pharmacy advise?)  Preferred Pharmacy (with phone number or street name): [facility] [address] [place] [phone] (Phone) [phone] (Fax)  Agent: Please be advised that RX refills may take up to 3 business days. We ask that you follow-up with your pharmacy.

## 2018-10-27 ENCOUNTER — Other Ambulatory Visit: Payer: Self-pay | Admitting: Family Medicine

## 2018-10-27 DIAGNOSIS — C3492 Malignant neoplasm of unspecified part of left bronchus or lung: Secondary | ICD-10-CM

## 2018-10-27 MED ORDER — OXYCODONE-ACETAMINOPHEN 5-325 MG PO TABS: 1.0000 | ORAL_TABLET | Freq: Every day | ORAL | 0 refills | Status: DC | PRN

## 2018-10-27 NOTE — Telephone Encounter (Signed)
Prescription for Percocet was sent to her pharmacy. Thanks, BJ

## 2018-10-27 NOTE — Telephone Encounter (Signed)
Patient informed and verbalized understanding

## 2018-11-09 ENCOUNTER — Telehealth: Payer: Self-pay | Admitting: Medical Oncology

## 2018-11-09 NOTE — Telephone Encounter (Signed)
SOB and pain with a deep breath Symptoms started 2 days ago.-"I don't feel very well. I have pain in my right upper back when I take a deep breath and have SOB ".   She has not checked her temperature . She has a roommate who comes and goes and Junita Push she does not know if she has been exposed to COVID-19. Pt is under observation for cancer. Pt said she has an appt  with PCP on Tuesday..   -Per Julien Nordmann I advised her to go to ED or urgent care now. She said " I think I will go tomorrow when I have a ride". I told her to call 911 . She is adamant about waiting until  tomorrow. She agreed if her symptoms worsen tonight she will call 911.

## 2018-11-14 ENCOUNTER — Other Ambulatory Visit: Payer: Self-pay

## 2018-11-14 ENCOUNTER — Ambulatory Visit (INDEPENDENT_AMBULATORY_CARE_PROVIDER_SITE_OTHER): Payer: Medicare Other | Admitting: Family Medicine

## 2018-11-14 ENCOUNTER — Encounter: Payer: Self-pay | Admitting: Family Medicine

## 2018-11-14 DIAGNOSIS — M549 Dorsalgia, unspecified: Secondary | ICD-10-CM | POA: Diagnosis not present

## 2018-11-14 DIAGNOSIS — J449 Chronic obstructive pulmonary disease, unspecified: Secondary | ICD-10-CM | POA: Diagnosis not present

## 2018-11-14 DIAGNOSIS — C3492 Malignant neoplasm of unspecified part of left bronchus or lung: Secondary | ICD-10-CM

## 2018-11-14 DIAGNOSIS — R0600 Dyspnea, unspecified: Secondary | ICD-10-CM

## 2018-11-14 DIAGNOSIS — G894 Chronic pain syndrome: Secondary | ICD-10-CM

## 2018-11-14 MED ORDER — OXYCODONE-ACETAMINOPHEN 5-325 MG PO TABS: ORAL_TABLET | ORAL | 0 refills | Status: DC

## 2018-11-14 MED ORDER — DOXYCYCLINE HYCLATE 100 MG PO TABS: 100.0000 mg | ORAL_TABLET | Freq: Two times a day (BID) | ORAL | 0 refills | Status: AC

## 2018-11-14 NOTE — Progress Notes (Signed)
Virtual Visit via Video Note   I connected with Felicia Herrera on 11/14/18 at  2:00 PM EDT by a video enabled telemedicine application and verified that I am speaking with the correct person using two identifiers.  Location patient: home Location provider:work or home office Persons participating in the virtual visit: patient, provider  I discussed the limitations of evaluation and management by telemedicine and the availability of in person appointments. The patient expressed understanding and agreed to proceed.   HPI: Felicia Herrera is a 63 yo female with Hx of lung adenocarcinoma and chronic pain disorder. S/P right lung lobectomy in 2008. Pain on surgical site, she takes Percocet 5-325 mg daily as needed. She has tolerated medication well. Pain is worse at night,interferring with sleep if she does not take medication, 10/10.  Pain exacerbated by minimal bending, putting her coat on,lifting,and straightening up. Alleviated by lying down Today she is c/o 4 days of severe pain left interscapular and post rib area. Gradual onset.  She is concerned because she has had similar pain when diagnosed with pneumonia. Treated with Levaquin in 06/2018.  Chest CT  1. Generally improved scattered nodular airspace opacities in the left lower lobe compatible with improving pneumonia. There are still some findings of atypical infectious bronchiolitis and some more confluent mild airspace opacity in the left lower lobe, but improved from previous. Surveillance is likely warranted. 2. Left suprahilar region of prior radiation therapy appear stable. 3. Right pneumonectomy.  Stable appearance of tracheal stent. 4. No overt pathologic adenopathy, although an anterior pericardial node is upper normal in size. 5. Healing fractures of the left ninth and tenth ribs posterolaterally, likely benign. Stable mild superior endplate compression at T3. 6.  Aortic Atherosclerosis (ICD10-I70.0).  Mildly worse  SOB,negative for wheezing or chest pain. Non productive cough stable. COPD,she is on Breo 1 puff daily.  Denies fever,chills,changes in appetite,sore throat.abdominal pain,changes in bowel habits,or urinary symptoms.   ROS: See pertinent positives and negatives per HPI. COVID-19 screening questions: Denies possible exposure to COVID-19. Negative for loss in the sense of smell or taste.   Past Medical History:  Diagnosis Date  . Adenocarcinoma of left lung, stage 2 ([place]) 12/27/2016  . Cancer [facility])    Lun cancer: Right Dx 2008, s/p lobectomy. Left Dx 09/2016  . COPD (chronic obstructive pulmonary disease) ([place])   . Encounter for antineoplastic chemotherapy 12/27/2016  . History of radiation therapy 01/24/17-03/08/17   left lung 2 Gy in 30 fractions  . Hyperlipidemia   . Hypertension   . Stroke [facility])     Past Surgical History:  Procedure Laterality Date  . BREAST BIOPSY     several. Denies Hx of breast cancer.  . IR FLUORO GUIDE PORT INSERTION RIGHT  02/04/2017  . IR US GUIDE VASC ACCESS RIGHT  02/04/2017  . THORACOTOMY/LOBECTOMY Right 2008  . TONSILLECTOMY  1960    Family History  Problem Relation Age of Onset  . Arthritis Mother   . Lung cancer Mother 76       d.45 from cancer  . Hypertension Father   . Arthritis Father   . Pancreatic cancer Sister 60       d.60 from cancer  . Brain cancer Brother        d.~70  . Colon cancer Brother        d.~70  . Hypertension Brother   . Mental retardation Brother   . Melanoma Brother 80  . Lung cancer Maternal Aunt 66  . Colon  cancer Maternal Grandfather   . Depression Neg Hx   . Heart disease Neg Hx     Social History   Socioeconomic History  . Marital status: Single    Spouse name: Not on file  . Number of children: Not on file  . Years of education: Not on file  . Highest education level: Not on file  Occupational History  . Occupation: Chief Strategy Officer  . Financial resource strain: Not on file  . Food  insecurity:    Worry: Not on file    Inability: Not on file  . Transportation needs:    Medical: Not on file    Non-medical: Not on file  Tobacco Use  . Smoking status: Former Smoker    Packs/day: 1.00    Years: 30.00    Pack years: 30.00    Types: Cigarettes    Last attempt to quit: 06/21/2005    Years since quitting: 13.4  . Smokeless tobacco: Never Used  Substance and Sexual Activity  . Alcohol use: No  . Drug use: No  . Sexual activity: Not Currently  Lifestyle  . Physical activity:    Days per week: Not on file    Minutes per session: Not on file  . Stress: Not on file  Relationships  . Social connections:    Talks on phone: Not on file    Gets together: Not on file    Attends religious service: Not on file    Active member of club or organization: Not on file    Attends meetings of clubs or organizations: Not on file    Relationship status: Not on file  . Intimate partner violence:    Fear of current or ex partner: Not on file    Emotionally abused: Not on file    Physically abused: Not on file    Forced sexual activity: Not on file  Other Topics Concern  . 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       Current Outpatient Medications:  .  aspirin EC 81 MG tablet, Take 81 mg by mouth daily., Disp: , Rfl:  .  atorvastatin (LIPITOR) 40 MG tablet, TAKE 1 TABLET BY MOUTH  DAILY, Disp: 90 tablet, Rfl: 3 .  betamethasone dipropionate (DIPROLENE) 0.05 % cream, Apply topically daily as needed., Disp: 30 g, Rfl: 2 .  BREO ELLIPTA 200-25 MCG/INH AEPB, USE 1 INHALATION BY MOUTH  DAILY, Disp: 180 each, Rfl: 1 .  clotrimazole (LOTRIMIN) 1 % cream, Apply 1 application topically 2 (two) times daily., Disp: 30 g, Rfl: 2 .  doxycycline (VIBRA-TABS) 100 MG tablet, Take 1 tablet (100 mg total) by mouth 2 (two) times daily for 7 days., Disp: 14 tablet, Rfl: 0 .  fluticasone (FLONASE) 50 MCG/ACT nasal spray, Place 1 spray into both nostrils 2 (two) times  daily., Disp: 16 g, Rfl: 6 .  levofloxacin (LEVAQUIN) 500 MG tablet, Take 1 tablet (500 mg total) by mouth daily., Disp: 10 tablet, Rfl: 0 .  levothyroxine (SYNTHROID, LEVOTHROID) 75 MCG tablet, TAKE 1 TABLET BY MOUTH  DAILY BEFORE BREAKFAST, Disp: 90 tablet, Rfl: 3 .  nystatin cream (MYCOSTATIN), Apply 1 application topically 2 (two) times daily., Disp: 30 g, Rfl: 0 .  omeprazole (PRILOSEC) 20 MG capsule, TAKE 1 CAPSULE BY MOUTH  DAILY, Disp: 90 capsule, Rfl: 3 .  oxyCODONE-acetaminophen (PERCOCET/ROXICET) 5-325 MG tablet, Take 1 tab bid for 7-10 days then back to once daily  if possible., Disp: 40 tablet, Rfl: 0 .  prochlorperazine (COMPAZINE) 10 MG tablet, Take 1 tablet (10 mg total) by mouth every 6 (six) hours as needed for nausea or vomiting., Disp: 30 tablet, Rfl: 0 No current facility-administered medications for this visit.   Facility-Administered Medications Ordered in Other Visits:  .  sodium chloride flush (NS) 0.9 % injection 10 mL, 10 mL, Intracatheter, PRN, Curt Bears, MD, 10 mL at 06/22/17 1747  EXAM:  VITALS per patient if applicable:N/A  GENERAL: alert, oriented, appears well and in no acute distress  HEENT: atraumatic, normocephalic,conjunctiva clear, no obvious facial abnormalities on inspection.  LUNGS: on inspection no signs of respiratory distress, breathing rate appears mildly elevated after coughing spells but normalized after a few seconds. No gasping or wheezing  CV: no obvious cyanosis  Felicia: moves all visible extremities without noticeable abnormality  PSYCH/NEURO: pleasant and cooperative, no obvious depression,she is anxious.  ASSESSMENT AND PLAN:  Discussed the following assessment and plan:  Upper back pain on left side - Plan: DG Chest 2 View, DG Ribs Unilateral Left Recurrent. Possible etiologies discussed,including process related to lung adenocarcinoma,infectious process. Rib X ra and CXR will be arranged. Empiric abx treatment with abx  started. Side effects discussed. Dyspnea, unspecified type - Plan: DG Chest 2 View  Adenocarcinoma of left lung, stage 2 ([place]) - Plan: oxyCODONE-acetaminophen (PERCOCET/ROXICET) 5-325 MG tablet  Dyspnea, unspecified type Possible causes discussed. Could be related to COPD,lung adenocarcinoma. Problem could be aggravated by thoracic pain. Instructed about warning signs. Further recommendations according to CXR results.   Chronic pain disorder Still tolerating Percocet well. Some side effects discussed. No changes in current management.   Stage 2 moderate COPD by GOLD classification ([place]) Even though she is not having wheezing or worsening cough,COPD could contributing to SOB. Continue following with pulmonologist.  No changes in current management.     I discussed the assessment and treatment plan with the patient. She was provided an opportunity to ask questions and all were answered. The patient agreed with the plan and demonstrated an understanding of the instructions.   The patient was advised to call back or seek an in-person evaluation if the symptoms worsen or if the condition fails to improve as anticipated.  Return in about 3 months (around 02/14/2019) for pain 3-4 months..    Betty [name], MD

## 2018-11-14 NOTE — Assessment & Plan Note (Signed)
Still tolerating Percocet well. Some side effects discussed. No changes in current management.

## 2018-11-14 NOTE — Assessment & Plan Note (Signed)
Even though she is not having wheezing or worsening cough,COPD could contributing to SOB. Continue following with pulmonologist.  No changes in current management.

## 2018-11-15 ENCOUNTER — Telehealth: Payer: Self-pay | Admitting: *Deleted

## 2018-11-15 NOTE — Telephone Encounter (Signed)
Questions for Screening COVID-19  Symptom onset: N/A   Travel or Contacts: No  During this illness, did/does the patient experience any of the following symptoms? Fever >100.78F []   Yes [x]   No []   Unknown Subjective fever (felt feverish) []   Yes [x]   No []   Unknown Chills []   Yes [x]   No []   Unknown Muscle aches (myalgia) []   Yes [x]   No []   Unknown Runny nose (rhinorrhea) [x]   Yes []   No []   Unknown Sore throat []   Yes [x]   No []   Unknown Cough (new onset or worsening of chronic cough) []   Yes [x]   No []   Unknown Shortness of breath (dyspnea) []   Yes [x]   No []   Unknown Nausea or vomiting []   Yes [x]   No []   Unknown Headache []   Yes [x]   No []   Unknown Abdominal pain  []   Yes [x]   No []   Unknown Diarrhea (?3 loose/looser than normal stools/24hr period) []   Yes [x]   No []   Unknown Other, specify:  Patient risk factors: Smoker? []   Current [x]   Former []   Never If female, currently pregnant? []   Yes [x]   No  Patient Active Problem List   Diagnosis Date Noted  . Left lower lobe pneumonia ([place]) 07/19/2018  . Chronic pain disorder 05/15/2018  . Intertrigo 05/15/2018  . Hyperlipidemia 10/18/2017  . Rhinitis, chronic 10/18/2017  . Port-A-Cath in place 05/25/2017  . Encounter for antineoplastic immunotherapy 05/10/2017  . Intractable intercostal neuropathic pain 05/10/2017  . Genetic testing 03/09/2017  . 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 Stage 2 moderate COPD by GOLD classification ([place]) 02/22/2017  . Adenocarcinoma of left lung, stage 2 ([place]) 12/27/2016  . Encounter for antineoplastic chemotherapy 12/27/2016  . Goals of care, counseling/discussion 12/27/2016  . GERD (gastroesophageal reflux disease) 12/17/2016  . Hypertension, essential, benign 12/17/2016  . History of COPD 12/09/2016  . History of lung cancer 12/09/2016  . Lung cancer ([place]) 11/04/2016  . Primary hypothyroidism 11/04/2016    Plan:    Patient reports she has runny nose and sneezing d/t  allergies. Patient made aware of the need to wear mask and to call when she is outside. Patient verbalized understanding.

## 2018-11-16 ENCOUNTER — Other Ambulatory Visit: Payer: Self-pay

## 2018-11-17 ENCOUNTER — Other Ambulatory Visit: Payer: Self-pay

## 2018-11-17 ENCOUNTER — Ambulatory Visit (INDEPENDENT_AMBULATORY_CARE_PROVIDER_SITE_OTHER): Payer: Medicare Other

## 2018-11-17 ENCOUNTER — Encounter: Payer: Self-pay | Admitting: Family Medicine

## 2018-11-17 ENCOUNTER — Other Ambulatory Visit: Payer: Medicare Other

## 2018-11-17 DIAGNOSIS — M549 Dorsalgia, unspecified: Secondary | ICD-10-CM

## 2018-11-17 DIAGNOSIS — R0600 Dyspnea, unspecified: Secondary | ICD-10-CM

## 2018-11-17 DIAGNOSIS — S2232XA Fracture of one rib, left side, initial encounter for closed fracture: Secondary | ICD-10-CM | POA: Diagnosis not present

## 2018-11-27 ENCOUNTER — Other Ambulatory Visit: Payer: Self-pay

## 2018-11-27 ENCOUNTER — Inpatient Hospital Stay: Payer: Medicare Other | Attending: Nurse Practitioner

## 2018-11-27 DIAGNOSIS — Z452 Encounter for adjustment and management of vascular access device: Secondary | ICD-10-CM | POA: Diagnosis not present

## 2018-11-27 DIAGNOSIS — Z95828 Presence of other vascular implants and grafts: Secondary | ICD-10-CM

## 2018-11-27 DIAGNOSIS — C3412 Malignant neoplasm of upper lobe, left bronchus or lung: Secondary | ICD-10-CM | POA: Insufficient documentation

## 2018-11-27 DIAGNOSIS — C3492 Malignant neoplasm of unspecified part of left bronchus or lung: Secondary | ICD-10-CM

## 2018-11-27 MED ORDER — SODIUM CHLORIDE 0.9% FLUSH
10.0000 mL | Freq: Once | INTRAVENOUS | Status: AC
  Administered 2018-11-27: 10 mL
  Filled 2018-11-27: qty 10

## 2018-11-27 MED ORDER — HEPARIN SOD (PORK) LOCK FLUSH 100 UNIT/ML IV SOLN
250.0000 [IU] | Freq: Once | INTRAVENOUS | Status: AC
  Administered 2018-11-27: 250 [IU]
  Filled 2018-11-27: qty 5

## 2018-11-29 ENCOUNTER — Ambulatory Visit: Payer: Self-pay | Admitting: *Deleted

## 2018-11-29 NOTE — Telephone Encounter (Signed)
Pt stated she has pneumonia and want to know what can she take for coughing  Reason for Disposition . Caller requesting a NON-URGENT new prescription or refill and triager unable to refill per unit policy  Answer Assessment - Initial Assessment Questions 1. SYMPTOMS: "Do you have any symptoms?"     coughing 2. SEVERITY: If symptoms are present, ask "Are they mild, moderate or severe?"     Attempted to call patient- to assess cough and what she is currently using for cough- no answer- left message to call back- message sent to PCP for advisement for patient.  Protocols used: MEDICATION QUESTION CALL-A-AH

## 2018-11-29 NOTE — Telephone Encounter (Signed)
Message sent to Dr. Jordan for review. Please advise 

## 2018-12-04 NOTE — Telephone Encounter (Signed)
She can try OTC Delsym. She could also request advice from her pulmonologist.  Thanks, BJ

## 2018-12-05 NOTE — Telephone Encounter (Signed)
Discussed notes per Dr. [name] with the patient. She expressed understanding. Nothing further needed.

## 2018-12-21 ENCOUNTER — Telehealth: Payer: Self-pay | Admitting: Family Medicine

## 2018-12-21 NOTE — Telephone Encounter (Signed)
Medication: oxyCODONE-acetaminophen (PERCOCET/ROXICET) 5-325 MG tablet  Has the patient contacted their pharmacy? no  Preferred Pharmacy (with phone number or street name):  [facility] [address] [place] [phone] (Phone) [phone] (Fax)   Agent: Please be advised that RX refills may take up to 3 business days. We ask that you follow-up with your pharmacy.

## 2018-12-25 ENCOUNTER — Encounter: Payer: Self-pay | Admitting: *Deleted

## 2018-12-25 NOTE — Telephone Encounter (Signed)
Message sent to Dr. Jordan for review and approval. 

## 2018-12-25 NOTE — Telephone Encounter (Signed)
Pt called to find out the status of sending oxyCODONE-acetaminophen (PERCOCET/ROXICET) 5-325 MG tablet To the [facility] on [facility] / please call Pt when sent

## 2018-12-26 ENCOUNTER — Other Ambulatory Visit: Payer: Self-pay | Admitting: Family Medicine

## 2018-12-26 DIAGNOSIS — C3492 Malignant neoplasm of unspecified part of left bronchus or lung: Secondary | ICD-10-CM

## 2018-12-26 DIAGNOSIS — G894 Chronic pain syndrome: Secondary | ICD-10-CM

## 2018-12-26 MED ORDER — OXYCODONE-ACETAMINOPHEN 5-325 MG PO TABS: 1.0000 | ORAL_TABLET | Freq: Every day | ORAL | 0 refills | Status: DC | PRN

## 2018-12-26 NOTE — Telephone Encounter (Signed)
Rx was sent to her pharmacy.  Thanks, BJ 

## 2019-01-02 ENCOUNTER — Telehealth: Payer: Self-pay | Admitting: Internal Medicine

## 2019-01-02 NOTE — Telephone Encounter (Signed)
rs appt per 7/13 sch message - unable to reach pt . Left message with appt date and time

## 2019-01-15 ENCOUNTER — Ambulatory Visit (HOSPITAL_COMMUNITY)
Admission: RE | Admit: 2019-01-15 | Discharge: 2019-01-15 | Disposition: A | Discharge status: Home / Self Care | Payer: Medicare Other | Source: Ambulatory Visit | Attending: Internal Medicine | Admitting: Internal Medicine

## 2019-01-15 ENCOUNTER — Other Ambulatory Visit: Payer: Medicare Other

## 2019-01-15 ENCOUNTER — Other Ambulatory Visit: Payer: Self-pay

## 2019-01-15 ENCOUNTER — Inpatient Hospital Stay: Payer: Medicare Other

## 2019-01-15 ENCOUNTER — Inpatient Hospital Stay: Payer: Medicare Other | Attending: Nurse Practitioner

## 2019-01-15 ENCOUNTER — Encounter (HOSPITAL_COMMUNITY): Payer: Self-pay

## 2019-01-15 DIAGNOSIS — C3490 Malignant neoplasm of unspecified part of unspecified bronchus or lung: Secondary | ICD-10-CM

## 2019-01-15 DIAGNOSIS — E785 Hyperlipidemia, unspecified: Secondary | ICD-10-CM | POA: Diagnosis not present

## 2019-01-15 DIAGNOSIS — Z9221 Personal history of antineoplastic chemotherapy: Secondary | ICD-10-CM | POA: Diagnosis not present

## 2019-01-15 DIAGNOSIS — J449 Chronic obstructive pulmonary disease, unspecified: Secondary | ICD-10-CM | POA: Diagnosis not present

## 2019-01-15 DIAGNOSIS — Z923 Personal history of irradiation: Secondary | ICD-10-CM | POA: Diagnosis not present

## 2019-01-15 DIAGNOSIS — I10 Essential (primary) hypertension: Secondary | ICD-10-CM | POA: Insufficient documentation

## 2019-01-15 DIAGNOSIS — Z8673 Personal history of transient ischemic attack (TIA), and cerebral infarction without residual deficits: Secondary | ICD-10-CM | POA: Insufficient documentation

## 2019-01-15 DIAGNOSIS — C3492 Malignant neoplasm of unspecified part of left bronchus or lung: Secondary | ICD-10-CM

## 2019-01-15 DIAGNOSIS — Z79899 Other long term (current) drug therapy: Secondary | ICD-10-CM | POA: Diagnosis not present

## 2019-01-15 DIAGNOSIS — Z7982 Long term (current) use of aspirin: Secondary | ICD-10-CM | POA: Insufficient documentation

## 2019-01-15 DIAGNOSIS — C3412 Malignant neoplasm of upper lobe, left bronchus or lung: Secondary | ICD-10-CM | POA: Insufficient documentation

## 2019-01-15 DIAGNOSIS — Z7951 Long term (current) use of inhaled steroids: Secondary | ICD-10-CM | POA: Diagnosis not present

## 2019-01-15 DIAGNOSIS — Z95828 Presence of other vascular implants and grafts: Secondary | ICD-10-CM

## 2019-01-15 LAB — CBC WITH DIFFERENTIAL (CANCER CENTER ONLY)
Abs Immature Granulocytes: 0.03 10*3/uL (ref 0.00–0.07)
Basophils Absolute: 0 10*3/uL (ref 0.0–0.1)
Basophils Relative: 1 %
Eosinophils Absolute: 0.1 10*3/uL (ref 0.0–0.5)
Eosinophils Relative: 1 %
HCT: 34.1 % — ABNORMAL LOW (ref 36.0–46.0)
Hemoglobin: 10.5 g/dL — ABNORMAL LOW (ref 12.0–15.0)
Immature Granulocytes: 0 %
Lymphocytes Relative: 12 %
Lymphs Abs: 1 10*3/uL (ref 0.7–4.0)
MCH: 25.2 pg — ABNORMAL LOW (ref 26.0–34.0)
MCHC: 30.8 g/dL (ref 30.0–36.0)
MCV: 81.8 fL (ref 80.0–100.0)
Monocytes Absolute: 0.5 10*3/uL (ref 0.1–1.0)
Monocytes Relative: 6 %
Neutro Abs: 6.5 10*3/uL (ref 1.7–7.7)
Neutrophils Relative %: 80 %
Platelet Count: 456 10*3/uL — ABNORMAL HIGH (ref 150–400)
RBC: 4.17 MIL/uL (ref 3.87–5.11)
RDW: 14.9 % (ref 11.5–15.5)
WBC Count: 8.2 10*3/uL (ref 4.0–10.5)
nRBC: 0 % (ref 0.0–0.2)

## 2019-01-15 LAB — CMP (CANCER CENTER ONLY)
ALT: 8 U/L (ref 0–44)
AST: 11 U/L — ABNORMAL LOW (ref 15–41)
Albumin: 3.2 g/dL — ABNORMAL LOW (ref 3.5–5.0)
Alkaline Phosphatase: 122 U/L (ref 38–126)
Anion gap: 11 (ref 5–15)
BUN: 11 mg/dL (ref 8–23)
CO2: 30 mmol/L (ref 22–32)
Calcium: 8.4 mg/dL — ABNORMAL LOW (ref 8.9–10.3)
Chloride: 101 mmol/L (ref 98–111)
Creatinine: 0.78 mg/dL (ref 0.44–1.00)
GFR, Est AFR Am: >60 mL/min (ref >60)
GFR, Estimated: >60 mL/min (ref >60)
Glucose, Bld: 98 mg/dL (ref 70–99)
Potassium: 3.2 mmol/L — ABNORMAL LOW (ref 3.5–5.1)
Sodium: 142 mmol/L (ref 135–145)
Total Bilirubin: 0.3 mg/dL (ref 0.3–1.2)
Total Protein: 7.2 g/dL (ref 6.5–8.1)

## 2019-01-15 MED ORDER — SODIUM CHLORIDE 0.9% FLUSH
10.0000 mL | Freq: Once | INTRAVENOUS | Status: DC
  Filled 2019-01-15: qty 10

## 2019-01-15 MED ORDER — SODIUM CHLORIDE (PF) 0.9 % IJ SOLN
INTRAMUSCULAR | Status: AC
  Filled 2019-01-15: qty 50

## 2019-01-15 MED ORDER — HEPARIN SOD (PORK) LOCK FLUSH 100 UNIT/ML IV SOLN
500.0000 [IU] | Freq: Once | INTRAVENOUS | Status: AC
  Administered 2019-01-15: 13:00:00 500 [IU] via INTRAVENOUS

## 2019-01-15 MED ORDER — HEPARIN SOD (PORK) LOCK FLUSH 100 UNIT/ML IV SOLN
INTRAVENOUS | Status: AC
  Filled 2019-01-15: qty 5

## 2019-01-15 MED ORDER — IOHEXOL 300 MG/ML  SOLN
75.0000 mL | Freq: Once | INTRAMUSCULAR | Status: AC | PRN
  Administered 2019-01-15: 75 mL via INTRAVENOUS

## 2019-01-15 NOTE — Patient Instructions (Signed)

## 2019-01-17 ENCOUNTER — Other Ambulatory Visit: Payer: Self-pay

## 2019-01-17 ENCOUNTER — Inpatient Hospital Stay (HOSPITAL_BASED_OUTPATIENT_CLINIC_OR_DEPARTMENT_OTHER): Payer: Medicare Other | Admitting: Internal Medicine

## 2019-01-17 ENCOUNTER — Encounter: Payer: Self-pay | Admitting: Internal Medicine

## 2019-01-17 ENCOUNTER — Telehealth: Payer: Self-pay | Admitting: Internal Medicine

## 2019-01-17 VITALS — BP 127/62 | HR 97 | Temp 97.8°F | Resp 18 | Ht 62.0 in | Wt 149.2 lb

## 2019-01-17 DIAGNOSIS — Z9221 Personal history of antineoplastic chemotherapy: Secondary | ICD-10-CM

## 2019-01-17 DIAGNOSIS — Z7951 Long term (current) use of inhaled steroids: Secondary | ICD-10-CM

## 2019-01-17 DIAGNOSIS — J690 Pneumonitis due to inhalation of food and vomit: Secondary | ICD-10-CM

## 2019-01-17 DIAGNOSIS — Z79899 Other long term (current) drug therapy: Secondary | ICD-10-CM

## 2019-01-17 DIAGNOSIS — Z8673 Personal history of transient ischemic attack (TIA), and cerebral infarction without residual deficits: Secondary | ICD-10-CM | POA: Diagnosis not present

## 2019-01-17 DIAGNOSIS — J449 Chronic obstructive pulmonary disease, unspecified: Secondary | ICD-10-CM

## 2019-01-17 DIAGNOSIS — C3412 Malignant neoplasm of upper lobe, left bronchus or lung: Secondary | ICD-10-CM | POA: Diagnosis not present

## 2019-01-17 DIAGNOSIS — I10 Essential (primary) hypertension: Secondary | ICD-10-CM

## 2019-01-17 DIAGNOSIS — Z923 Personal history of irradiation: Secondary | ICD-10-CM | POA: Diagnosis not present

## 2019-01-17 DIAGNOSIS — C3490 Malignant neoplasm of unspecified part of unspecified bronchus or lung: Secondary | ICD-10-CM

## 2019-01-17 DIAGNOSIS — E785 Hyperlipidemia, unspecified: Secondary | ICD-10-CM | POA: Diagnosis not present

## 2019-01-17 DIAGNOSIS — Z7982 Long term (current) use of aspirin: Secondary | ICD-10-CM | POA: Diagnosis not present

## 2019-01-17 NOTE — Progress Notes (Signed)
.mo      [place] Telephone:([phone]) [phone]   Fax:(336) [phone]  OFFICE PROGRESS NOTE  [name], [name], MD [place] [address]  DIAGNOSIS: Stage IIB (T2b, N1, M0) non-small cell lung cancer, adenocarcinoma presented with large left upper lobe lung mass in addition to left hilar lymphadenopathy diagnosed in April 2018. Her previous workup was done at [facility] in [facility]. There was insufficient material to perform the molecular studies.  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 Negative for EGFR, ALK, ROS1, BRAF mutations.  PRIOR THERAPY:  1) A course of concurrent chemoradiation with weekly carboplatin for AUC of 2 and paclitaxel 45 MG/M2. First dose 01/24/2017. Status post 6 cycles. 2) Consolidation immunotherapy with Imfinzi (Durvalumab) 10 mg/KG every 2 weeks status post 26 cycles.   CURRENT THERAPY: Observation.  INTERVAL HISTORY: Felicia Supak63y.o. female returns to the clinic today for 3 months follow-up visit.  The patient is feeling fine today with no concerning complaints except for pain on the left side of the chest as well as dry cough.  She had several rib fractures on the left side secondary to her cough.  She was treated recently by her primary care physician for questionable pneumonia with Levaquin which completed few days ago.  She denied having any shortness of breath except with exertion and no hemoptysis.  She denied having any recent weight loss or night sweats.  She has no nausea, vomiting, diarrhea or constipation.  She had repeat CT scan of the chest performed recently and she is here for evaluation and discussion of her scan results.  MEDICAL HISTORY: Past Medical History:  Diagnosis Date   Adenocarcinoma of [place] lung, stage 2 [place] 12/27/2016   [place]    Lun cancer: Right Dx 2008, s/p lobectomy. Left Dx 09/2016   COPD (chronic obstructive pulmonary disease) ([place]     Encounter for antineoplastic chemotherapy 12/27/2016   History of radiation therapy 01/24/17-03/08/17   left lung 2 Gy in 30 fractions   Hyperlipidemia    Hypertension    Stroke ([facility]     ALLERGIES:  has No Known Allergies.  MEDICATIONS:  Current Outpatient Medications  Medication Sig Dispense Refill   aspirin EC 81 MG tablet Take 81 mg by mouth daily.     atorvastatin (LIPITOR) 40 MG tablet TAKE 1 TABLET BY MOUTH  DAILY 90 tablet 3   betamethasone dipropionate (DIPROLENE) 0.05 % cream Apply topically daily as needed. 30 g 2   BREO ELLIPTA 200-25 MCG/INH AEPB USE 1 INHALATION BY MOUTH  DAILY 180 each 1   clotrimazole (LOTRIMIN) 1 % cream Apply 1 application topically 2 (two) times daily. 30 g 2   fluticasone (FLONASE) 50 MCG/ACT nasal spray Place 1 spray into both nostrils 2 (two) times daily. 16 g 6   levothyroxine (SYNTHROID, LEVOTHROID) 75 MCG tablet TAKE 1 TABLET BY MOUTH  DAILY BEFORE BREAKFAST 90 tablet 3   nystatin cream (MYCOSTATIN) Apply 1 application topically 2 (two) times daily. 30 g 0   omeprazole (PRILOSEC) 20 MG capsule TAKE 1 CAPSULE BY MOUTH  DAILY 90 capsule 3   oxyCODONE-acetaminophen (PERCOCET/ROXICET) 5-325 MG tablet Take 1 tablet by mouth daily as needed for severe pain. 30 tablet 0   prochlorperazine (COMPAZINE) 10 MG tablet Take 1 tablet (10 mg total) by mouth every 6 (six) hours as needed for nausea or vomiting. 30 tablet 0   No current facility-administered medications for this visit.  Facility-Administered Medications Ordered in Other Visits  Medication Dose Route Frequency Provider Last Rate Last Dose   sodium chloride flush (NS) 0.9 % injection 10 mL  10 mL Intracatheter PRN Curt Bears, MD   10 mL at 06/22/17 1747    SURGICAL HISTORY:  Past Surgical History:  Procedure Laterality Date   BREAST BIOPSY     several. Denies Hx of breast cancer.   IR FLUORO GUIDE PORT INSERTION RIGHT  02/04/2017   IR US GUIDE VASC ACCESS RIGHT   02/04/2017   THORACOTOMY/LOBECTOMY Right 2008   TONSILLECTOMY  1960    REVIEW OF SYSTEMS:  A comprehensive review of systems was negative except for: Constitutional: positive for fatigue Respiratory: positive for cough, dyspnea on exertion and pleurisy/chest pain   PHYSICAL EXAMINATION: General appearance: alert, cooperative, fatigued and no distress Head: Normocephalic, without obvious abnormality, atraumatic Neck: no adenopathy, no JVD, supple, symmetrical, trachea midline and thyroid not enlarged, symmetric, no tenderness/mass/nodules Lymph nodes: Cervical, supraclavicular, and axillary nodes normal. Resp: diminished breath sounds RLL and dullness to percussion RLL Back: symmetric, no curvature. ROM normal. No CVA tenderness. Cardio: regular rate and rhythm, S1, S2 normal, no murmur, click, rub or gallop GI: soft, non-tender; bowel sounds normal; no masses,  no organomegaly Extremities: extremities normal, atraumatic, no cyanosis or edema  ECOG PERFORMANCE STATUS: 1 - Symptomatic but completely ambulatory  Blood pressure 127/62, pulse 97, temperature 97.8 F (36.6 C), temperature source Oral, resp. rate 18, height 5' 2" (1.575 m), weight 149 lb 3.2 oz (67.7 kg), SpO2 92 %.  LABORATORY DATA: Lab Results  Component Value Date   WBC 8.2 01/15/2019   HGB 10.5 (L) 01/15/2019   HCT 34.1 (L) 01/15/2019   MCV 81.8 01/15/2019   PLT 456 (H) 01/15/2019      Chemistry      Component Value Date/Time   NA 142 01/15/2019 1114   NA 142 06/22/2017 1335   K 3.2 (L) 01/15/2019 1114   K 3.9 06/22/2017 1335   CL 101 01/15/2019 1114   CO2 30 01/15/2019 1114   CO2 26 06/22/2017 1335   BUN 11 01/15/2019 1114   BUN 13.6 06/22/2017 1335   CREATININE 0.78 01/15/2019 1114   CREATININE 0.7 06/22/2017 1335      Component Value Date/Time   CALCIUM 8.4 (L) 01/15/2019 1114   CALCIUM 9.4 06/22/2017 1335   ALKPHOS 122 01/15/2019 1114   ALKPHOS 93 06/22/2017 1335   AST 11 (L) 01/15/2019 1114    AST 12 06/22/2017 1335   ALT 8 01/15/2019 1114   ALT 15 06/22/2017 1335   BILITOT 0.3 01/15/2019 1114   BILITOT 0.25 06/22/2017 1335       RADIOGRAPHIC STUDIES: Ct Chest W Contrast  Result Date: 01/15/2019 CLINICAL DATA:  Non-small-cell lung cancer post surgery. Chemotherapy and radiation therapy completed in 2018. Immunotherapy completed 9 months ago. Left rib pain with chronic cough. EXAM: CT CHEST WITH CONTRAST TECHNIQUE: Multidetector CT imaging of the chest was performed during intravenous contrast administration. CONTRAST:  35m OMNIPAQUE IOHEXOL 300 MG/ML  SOLN COMPARISON:  Chest CT 10/16/2018 and 07/17/2018. FINDINGS: Cardiovascular: Moderate atherosclerosis of the aorta, great vessels and coronary arteries. No acute vascular findings are seen. The right IJ Port-A-Cath extends to the superior cavoatrial junction. The heart size is normal. There is no pericardial effusion. Mediastinum/Nodes: Stable mediastinal shift to the right status post right pneumonectomy. Distal tracheal stent appears unchanged. 8 mm right pericardiac node on image 122/2 is stable. There are no enlarged mediastinal,  hilar or axillary lymph nodes. Lungs/Pleura: There is no pleural effusion or pneumothorax. Chronic pleural thickening posteriorly in the right hemithorax is stable status post right pneumonectomy. There is stable extension of the left upper lobe into the upper right hemithorax anteriorly. There are stable radiation changes surrounding the central portion of the left upper lobe. Mild emphysematous changes are present. Again demonstrated is fluctuating ill-defined nodularity in the left lower lobe which appears similar to the most recent study and remains improved from 6 months ago, probably inflammatory. There are no discrete enlarging pulmonary nodules to suggest metastatic disease. Upper abdomen: The visualized upper abdomen appears stable without significant findings. Musculoskeletal/Chest wall: Stable  thoracotomy defects on the right and chronic unhealed fracture of the right 3rd rib. There are multiple new left-sided rib fractures, including fractures of 4th and 5th ribs laterally which appear acute. Fractures of the 6 through 10th ribs appear subacute with partial healing. Chronic T3 compression deformities unchanged. IMPRESSION: 1. Multiple new acute and subacute left rib fractures. No pleural effusion or pneumothorax. 2. Waxing and waning peribronchovascular nodularity in the left lower lobe appear similar to the most recent study, and remains most consistent with chronic inflammation (recurrent aspiration versus atypical mycobacterial infection). 3. No suspicious pulmonary nodularity or adenopathy to suggest metastatic disease. 4. Stable postsurgical changes from right pneumonectomy and stable left perihilar radiation changes. Electronically Signed   By: Richardean Sale M.D.   On: 01/15/2019 15:32    ASSESSMENT AND PLAN: This is a very pleasant 63 years old white female recently diagnosed with unresectable a stage IIB non-small cell lung cancer, adenocarcinoma. The patient underwent a course of concurrent chemoradiation with weekly carboplatin and paclitaxel status post 6 cycles.  She tolerated the treatment well and had partial response. She completed a course of consolidation treatment with immunotherapy with Imfinzi (Durvalumab) status post 26 cycles.  She tolerated her treatment well with no concerning adverse effects. The patient is currently on observation and she is feeling fine. She was recently treated for pneumonia. She had repeat CT scan of the chest performed recently.  I personally and independently reviewed the scan images and discussed the results with the patient today. Her scan showed no concerning findings for disease progression except for the multiple fracture on the left side ribs secondary to persistent cough. I recommended for the patient to continue on observation with  repeat CT scan of the chest in 3 months. For the persistent cough, she was advised to use Delsym over-the-counter on as-needed basis. She was also advised to call immediately if she has any other concerning symptoms in the interval. The patient voices understanding of current disease status and treatment options and is in agreement with the current care plan. All questions were answered. The patient knows to call the clinic with any problems, questions or concerns. We can certainly see the patient much sooner if necessary.  Disclaimer: This note was dictated with voice recognition software. Similar sounding words can inadvertently be transcribed and may not be corrected upon review.

## 2019-01-17 NOTE — Telephone Encounter (Signed)
Scheduled appt per 7/29 los - mailed letter with appt date and time  Central radiology to contact patient with ct scan.

## 2019-01-18 ENCOUNTER — Telehealth: Payer: Self-pay | Admitting: *Deleted

## 2019-01-18 NOTE — Telephone Encounter (Signed)
[place] Work  Patient stopped by [place] office requesting information on SCAT application.  CSW not available to meet with patient at the time.  CSW contacted SCAT office to determine patient's eligibility.  Almedia Balls stated she needed additional information other than cancer diagnosis, as patient shared she has additional health concerns.  Ms. Ellouise Newer plans to send provider form to patient's PCP to complete form for non- cancer related illnesses.  CSW attempted to contact patient, left the above information in the form of a voicemail.  Gwinda Maine, LCSW  Clinical Social Worker [facility]

## 2019-01-22 ENCOUNTER — Telehealth: Payer: Self-pay | Admitting: Family Medicine

## 2019-01-22 NOTE — Telephone Encounter (Signed)
[place] for refill  LOV05/26/2020  Qty. 30 No refills  Next ov not yet set up called pt but no answer

## 2019-01-22 NOTE — Telephone Encounter (Signed)
Relation to pt: self  Call back number: [phone]  Pharmacy: [facility] [address] [place] [phone] (Phone) [phone] (Fax)     Reason for call:  Patient requesting oxyCODONE-acetaminophen (PERCOCET/ROXICET) 5-325 MG tablet refill, informed patient please allow 48 to 72 hour turn around time

## 2019-01-23 ENCOUNTER — Other Ambulatory Visit: Payer: Self-pay | Admitting: Family Medicine

## 2019-01-23 DIAGNOSIS — C3492 Malignant neoplasm of unspecified part of left bronchus or lung: Secondary | ICD-10-CM

## 2019-01-23 DIAGNOSIS — G894 Chronic pain syndrome: Secondary | ICD-10-CM

## 2019-01-23 MED ORDER — OXYCODONE-ACETAMINOPHEN 5-325 MG PO TABS: 1.0000 | ORAL_TABLET | Freq: Every day | ORAL | 0 refills | Status: DC | PRN

## 2019-01-23 NOTE — Telephone Encounter (Signed)
Prescription was sent to her pharmacy to be filled when she is due. Please remind her that she is due for follow-up the last week of 01/2019.  Thanks, BJ

## 2019-01-24 NOTE — Telephone Encounter (Signed)
Patient informed Rx was sent to the pharmacy. Patient scheduled follow-up for 02/12/2019.

## 2019-01-25 ENCOUNTER — Encounter: Payer: Self-pay | Admitting: General Practice

## 2019-01-25 NOTE — Progress Notes (Signed)
[place] CSW Progress Notes  Message from patient - wants information on status of her SCAT application, wanted to talk w CSW Wallis Bamberg.  CSW [place] on [place], per chart note, patient was left VM w instructions to have PCP also complete Provider Certification form for SCAT.  Emailed patient link to this form, asked her to work w PCP Betty [name] MD to have this task completed and faxed to SCAT.  Edwyna Shell, LCSW Clinical Social Worker Phone:  [phone]

## 2019-01-29 DIAGNOSIS — Z1231 Encounter for screening mammogram for malignant neoplasm of breast: Secondary | ICD-10-CM | POA: Diagnosis not present

## 2019-01-29 LAB — HM MAMMOGRAPHY

## 2019-02-06 ENCOUNTER — Encounter: Payer: Self-pay | Admitting: Family Medicine

## 2019-02-10 ENCOUNTER — Other Ambulatory Visit: Payer: Self-pay | Admitting: Internal Medicine

## 2019-02-10 DIAGNOSIS — Z8709 Personal history of other diseases of the respiratory system: Secondary | ICD-10-CM

## 2019-02-12 ENCOUNTER — Encounter: Payer: Self-pay | Admitting: Family Medicine

## 2019-02-12 ENCOUNTER — Other Ambulatory Visit: Payer: Self-pay

## 2019-02-12 ENCOUNTER — Ambulatory Visit (INDEPENDENT_AMBULATORY_CARE_PROVIDER_SITE_OTHER): Payer: Medicare Other | Admitting: Family Medicine

## 2019-02-12 VITALS — BP 130/74 | HR 93 | Temp 98.4°F

## 2019-02-12 DIAGNOSIS — R7303 Prediabetes: Secondary | ICD-10-CM | POA: Diagnosis not present

## 2019-02-12 DIAGNOSIS — G894 Chronic pain syndrome: Secondary | ICD-10-CM

## 2019-02-12 DIAGNOSIS — C3492 Malignant neoplasm of unspecified part of left bronchus or lung: Secondary | ICD-10-CM | POA: Diagnosis not present

## 2019-02-12 DIAGNOSIS — K5900 Constipation, unspecified: Secondary | ICD-10-CM | POA: Diagnosis not present

## 2019-02-12 DIAGNOSIS — R0682 Tachypnea, not elsewhere classified: Secondary | ICD-10-CM | POA: Diagnosis not present

## 2019-02-12 LAB — POCT GLYCOSYLATED HEMOGLOBIN (HGB A1C): Hemoglobin A1C: 6.1 % — AB (ref 4.0–5.6)

## 2019-02-12 MED ORDER — OXYCODONE-ACETAMINOPHEN 5-325 MG PO TABS: 1.0000 | ORAL_TABLET | Freq: Every day | ORAL | 0 refills | Status: DC | PRN

## 2019-02-12 NOTE — Patient Instructions (Signed)
A few things to remember from today's visit:   Prediabetes  Adenocarcinoma of left lung, stage 2 ([place]) - Plan: oxyCODONE-acetaminophen (PERCOCET/ROXICET) 5-325 MG tablet, DISCONTINUED: oxyCODONE-acetaminophen (PERCOCET/ROXICET) 5-325 MG tablet, DISCONTINUED: oxyCODONE-acetaminophen (PERCOCET/ROXICET) 5-325 MG tablet  Chronic pain disorder - Plan: oxyCODONE-acetaminophen (PERCOCET/ROXICET) 5-325 MG tablet, DISCONTINUED: oxyCODONE-acetaminophen (PERCOCET/ROXICET) 5-325 MG tablet, DISCONTINUED: oxyCODONE-acetaminophen (PERCOCET/ROXICET) 5-325 MG tablet  Constipation, unspecified constipation type  I sent 3 prescriptions for Percocet.  Miralax to take daily as needed for constipation.  Fruit can also increased blood sugar.  Please be sure medication list is accurate. If a new problem present, please set up appointment sooner than planned today.

## 2019-02-12 NOTE — Progress Notes (Signed)
HPI:   [name] is a 63 y.o. female, who is here today for chronic disease management.  Since her last visit she has follow-up with oncologist. Recently treated for aspiration pneumonia.  Chronic pain: Hyper thoracic and axillary pain, developed after surgery for 5 lung cancer in 2008, status post lobectomy. She takes Percocet 5-325 mg once daily, at bedtime otherwise pain will interfere with sleep.  Max level of pain 8/10, intermittently. Exacerbated by certain movements. Alleviated by rest. She has tolerated medication well. She has been on Percocet for about 2 to 3 years. Medication was initially started by her oncologist while she was on chemotherapy.  Chest CT 01/15/19: 1. Multiple new acute and subacute left rib fractures. No pleural effusion or pneumothorax. 2. Waxing and waning peribronchovascular nodularity in the left lower lobe appear similar to the most recent study, and remains most consistent with chronic inflammation (recurrent aspiration versus atypical mycobacterial infection). 3. No suspicious pulmonary nodularity or adenopathy to suggest metastatic disease. 4. Stable postsurgical changes from right pneumonectomy and stable left perihilar radiation changes.  Today she is a wheel chair,states that she does "not feel good." She cannot specify symptoms. C/O "foggy head." She denies worsening cough or dyspnea. Intermittent episodes of coughing spells with clear/yellowish sputum. She denies hemoptysis.  She denies fever or changes in appetite. Still having left rib cage pain due to rib fractures. She denies any fall or trauma.  Negative for abdominal pain, nausea, vomiting, or urinary symptoms.  "Little" constipation. Last bowel movement 2 days ago. She eats "a lot of" fruit and this helps.  Prediabetes: Denies polydipsia,polyuria, or polyphagia.  Lab Results  Component Value Date   HGBA1C 6.0 10/18/2017   Today she requesting scat  form to be completed.  She does not drive and she has difficulties with transportation.  Review of Systems  Constitutional: Positive for chills (occasionally and at night) and fatigue. Negative for appetite change and fever.  Respiratory: Positive for cough. Negative for shortness of breath and wheezing.   Genitourinary: Negative for decreased urine volume, dysuria and hematuria.  Allergic/Immunologic: Positive for environmental allergies.  Psychiatric/Behavioral: The patient is nervous/anxious.   Rest see pertinent positives and negatives per HPI.   Current Outpatient Medications on File Prior to Visit  Medication Sig Dispense Refill  . aspirin EC 81 MG tablet Take 81 mg by mouth daily.    Marland Kitchen atorvastatin (LIPITOR) 40 MG tablet TAKE 1 TABLET BY MOUTH  DAILY 90 tablet 3  . betamethasone dipropionate (DIPROLENE) 0.05 % cream Apply topically daily as needed. 30 g 2  . clotrimazole (LOTRIMIN) 1 % cream Apply 1 application topically 2 (two) times daily. 30 g 2  . fluticasone (FLONASE) 50 MCG/ACT nasal spray Place 1 spray into both nostrils 2 (two) times daily. 16 g 6  . levothyroxine (SYNTHROID, LEVOTHROID) 75 MCG tablet TAKE 1 TABLET BY MOUTH  DAILY BEFORE BREAKFAST 90 tablet 3  . nystatin cream (MYCOSTATIN) Apply 1 application topically 2 (two) times daily. 30 g 0  . omeprazole (PRILOSEC) 20 MG capsule TAKE 1 CAPSULE BY MOUTH  DAILY 90 capsule 3  . prochlorperazine (COMPAZINE) 10 MG tablet Take 1 tablet (10 mg total) by mouth every 6 (six) hours as needed for nausea or vomiting. 30 tablet 0  . BREO ELLIPTA 200-25 MCG/INH AEPB USE 1 INHALATION BY MOUTH  DAILY  AT THE SAME TIME  EACH DAY 180 each 0   Current Facility-Administered Medications on File Prior to  Visit  Medication Dose Route Frequency Provider Last Rate Last Dose  . sodium chloride flush (NS) 0.9 % injection 10 mL  10 mL Intracatheter PRN Curt Bears, MD   10 mL at 06/22/17 1747     Past Medical History:  Diagnosis Date  .  Adenocarcinoma of left lung, stage 2 ([place]) 12/27/2016  . Cancer [facility])    Lun cancer: Right Dx 2008, s/p lobectomy. Left Dx 09/2016  . COPD (chronic obstructive pulmonary disease) ([place])   . Encounter for antineoplastic chemotherapy 12/27/2016  . History of radiation therapy 01/24/17-03/08/17   left lung 2 Gy in 30 fractions  . Hyperlipidemia   . Hypertension   . Stroke [facility])    No Known Allergies  Social History   Socioeconomic History  . Marital status: Single    Spouse name: Not on file  . Number of children: Not on file  . Years of education: Not on file  . Highest education level: Not on file  Occupational History  . Occupation: Chief Strategy Officer  . Financial resource strain: Not on file  . Food insecurity    Worry: Not on file    Inability: Not on file  . Transportation needs    Medical: Not on file    Non-medical: Not on file  Tobacco Use  . Smoking status: Former Smoker    Packs/day: 1.00    Years: 30.00    Pack years: 30.00    Types: Cigarettes    Quit date: 06/21/2005    Years since quitting: 13.6  . Smokeless tobacco: Never Used  Substance and Sexual Activity  . Alcohol use: No  . Drug use: No  . Sexual activity: Not Currently  Lifestyle  . Physical activity    Days per week: Not on file    Minutes per session: Not on file  . Stress: Not on file  Relationships  . Social Herbalist on phone: Not on file    Gets together: Not on file    Attends religious service: Not on file    Active member of club or organization: Not on file    Attends meetings of clubs or organizations: Not on file    Relationship status: Not on file  Other Topics Concern  . 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     Vitals:   02/12/19 1133  BP: 130/74  Pulse: 93  Temp: 98.4 F (36.9 C)  SpO2: 96%   There is no height or weight on file to calculate BMI.  Physical Exam  Nursing note and vitals reviewed. Constitutional: She is  oriented to person, place, and time. She appears well-developed. No distress.  HENT:  Head: Normocephalic and atraumatic.  Mouth/Throat: Oropharynx is clear and moist and mucous membranes are normal.  Eyes: Pupils are equal, round, and reactive to light. Conjunctivae are normal.  Cardiovascular: Normal rate and regular rhythm.  No murmur heard. Respiratory: Effort normal and breath sounds normal. No respiratory distress.  Hyperventilating intermittently during OV. Shallow breathing. RR 16-24/min.Marland Kitchen  GI: Soft. There is no abdominal tenderness.  Musculoskeletal:        General: No edema.  Lymphadenopathy:    She has no cervical adenopathy.  Neurological: She is alert and oriented to person, place, and time. She has normal strength. No cranial nerve deficit.  In a wheel chair today.  Skin: Skin is warm. No rash noted. No erythema.  Psychiatric: Her  mood appears anxious.  Fairly groomed, good eye contact.    ASSESSMENT AND PLAN:  Ms. Felicia Herrera was seen today for follow-up.  Diagnoses and all orders for this visit:  HgA1C today 6.1  Chronic pain disorder In general pain seems to be well controlled. We discussed some side effects of opioids. Med contract signed in 05/2018.  controlled substance report website reviewed. Prescriptions x3 sent to her pharmacy. Follow-up in 4 months.  -     Discontinue: oxyCODONE-acetaminophen (PERCOCET/ROXICET) 5-325 MG tablet; Take 1 tablet by mouth daily as needed for severe pain. -     Discontinue: oxyCODONE-acetaminophen (PERCOCET/ROXICET) 5-325 MG tablet; Take 1 tablet by mouth daily as needed for severe pain. -     oxyCODONE-acetaminophen (PERCOCET/ROXICET) 5-325 MG tablet; Take 1 tablet by mouth daily as needed for severe pain.  Adenocarcinoma of left lung, stage 2 (HCC) Currently she is not on treatment, having chest imaging periodically to monitor for changes. She is reporting symptoms as a stable. Continue following with oncologist.  -      Discontinue: oxyCODONE-acetaminophen (PERCOCET/ROXICET) 5-325 MG tablet; Take 1 tablet by mouth daily as needed for severe pain. -     Discontinue: oxyCODONE-acetaminophen (PERCOCET/ROXICET) 5-325 MG tablet; Take 1 tablet by mouth daily as needed for severe pain. -     oxyCODONE-acetaminophen (PERCOCET/ROXICET) 5-325 MG tablet; Take 1 tablet by mouth daily as needed for severe pain.  Prediabetes Recommended healthy lifestyle for primary prevention. Decreased fruit intake to 3 portions per day. We will continue following.  Constipation, unspecified constipation type Adequate fluid and fiber intake. Poor elevated with chronic opioid intake. MiraLAX daily as needed recommended. Instructed about warning signs.  Tachypnea Intermittent during her visit. While she was filling out scat form,RR was around 16/min. She is not in respiratory distress and lung auscultation negative for rales, rhonchi, or wheezing. Recommend avoiding shallow breathing. O2 sat is adequate today. At this point I do not think chest imaging is necessary but she was clearly instructed about warning signs.   SCAT form will be completed and faxed. Return in about 4 months (around 06/14/2019) for pain and thyroid.   -Ms. Felicia Herrera was advised to return sooner than planned today if new concerns arise.    Marshell Dilauro G. [name], MD  [facility]. [place] office.

## 2019-02-19 ENCOUNTER — Inpatient Hospital Stay: Payer: Medicare Other

## 2019-02-20 ENCOUNTER — Ambulatory Visit: Payer: Medicare Other

## 2019-02-27 ENCOUNTER — Other Ambulatory Visit: Payer: Self-pay

## 2019-02-27 ENCOUNTER — Inpatient Hospital Stay: Payer: Medicare Other | Attending: Nurse Practitioner

## 2019-02-27 DIAGNOSIS — Z452 Encounter for adjustment and management of vascular access device: Secondary | ICD-10-CM | POA: Diagnosis not present

## 2019-02-27 DIAGNOSIS — C3412 Malignant neoplasm of upper lobe, left bronchus or lung: Secondary | ICD-10-CM | POA: Diagnosis not present

## 2019-02-27 DIAGNOSIS — Z95828 Presence of other vascular implants and grafts: Secondary | ICD-10-CM

## 2019-02-27 DIAGNOSIS — C3492 Malignant neoplasm of unspecified part of left bronchus or lung: Secondary | ICD-10-CM

## 2019-02-27 MED ORDER — SODIUM CHLORIDE 0.9% FLUSH
10.0000 mL | Freq: Once | INTRAVENOUS | Status: AC
  Administered 2019-02-27: 15:00:00 10 mL
  Filled 2019-02-27: qty 10

## 2019-02-27 MED ORDER — HEPARIN SOD (PORK) LOCK FLUSH 100 UNIT/ML IV SOLN
250.0000 [IU] | Freq: Once | INTRAVENOUS | Status: AC
  Administered 2019-02-27: 15:00:00 100 [IU]
  Filled 2019-02-27: qty 5

## 2019-03-05 ENCOUNTER — Ambulatory Visit (INDEPENDENT_AMBULATORY_CARE_PROVIDER_SITE_OTHER): Payer: Medicare Other | Admitting: Primary Care

## 2019-03-05 ENCOUNTER — Other Ambulatory Visit: Payer: Self-pay

## 2019-03-05 ENCOUNTER — Encounter: Payer: Self-pay | Admitting: Primary Care

## 2019-03-05 VITALS — BP 134/76 | HR 93 | Ht 62.0 in | Wt 151.2 lb

## 2019-03-05 DIAGNOSIS — J441 Chronic obstructive pulmonary disease with (acute) exacerbation: Secondary | ICD-10-CM | POA: Diagnosis not present

## 2019-03-05 DIAGNOSIS — R0602 Shortness of breath: Secondary | ICD-10-CM

## 2019-03-05 DIAGNOSIS — J449 Chronic obstructive pulmonary disease, unspecified: Secondary | ICD-10-CM

## 2019-03-05 DIAGNOSIS — R6889 Other general symptoms and signs: Secondary | ICD-10-CM | POA: Diagnosis not present

## 2019-03-05 LAB — CBC WITH DIFFERENTIAL/PLATELET
Basophils Absolute: 0 10*3/uL (ref 0.0–0.1)
Basophils Relative: 0.6 % (ref 0.0–3.0)
Eosinophils Absolute: 0.1 10*3/uL (ref 0.0–0.7)
Eosinophils Relative: 1.1 % (ref 0.0–5.0)
HCT: 33.2 % — ABNORMAL LOW (ref 36.0–46.0)
Hemoglobin: 10.6 g/dL — ABNORMAL LOW (ref 12.0–15.0)
Lymphocytes Relative: 11.7 % — ABNORMAL LOW (ref 12.0–46.0)
Lymphs Abs: 1 10*3/uL (ref 0.7–4.0)
MCHC: 31.9 g/dL (ref 30.0–36.0)
MCV: 78.7 fl (ref 78.0–100.0)
Monocytes Absolute: 0.5 10*3/uL (ref 0.1–1.0)
Monocytes Relative: 6.2 % (ref 3.0–12.0)
Neutro Abs: 6.7 10*3/uL (ref 1.4–7.7)
Neutrophils Relative %: 80.4 % — ABNORMAL HIGH (ref 43.0–77.0)
Platelets: 444 10*3/uL — ABNORMAL HIGH (ref 150.0–400.0)
RBC: 4.21 Mil/uL (ref 3.87–5.11)
RDW: 16.3 % — ABNORMAL HIGH (ref 11.5–15.5)
WBC: 8.4 10*3/uL (ref 4.0–10.5)

## 2019-03-05 LAB — D-DIMER, QUANTITATIVE: D-Dimer, Quant: 0.69 mcg/mL FEU — ABNORMAL HIGH (ref <0.50)

## 2019-03-05 LAB — BRAIN NATRIURETIC PEPTIDE: Pro B Natriuretic peptide (BNP): 249 pg/mL — ABNORMAL HIGH (ref 0.0–100.0)

## 2019-03-05 MED ORDER — DOXYCYCLINE HYCLATE 100 MG PO TABS: 100.0000 mg | ORAL_TABLET | Freq: Two times a day (BID) | ORAL | 0 refills | Status: DC

## 2019-03-05 NOTE — Assessment & Plan Note (Addendum)
-   Suspected COPD exacerbation with productive cough/SOB - Lungs clear on exam. No wheezing.  - RX doxycycline 1 tab twice daily x 7 days; oral steroid not indicated at this time  - Advised Mucinex twice daily for 1-2 weeks until congestion improved  - Continue Breo 200 two puffs twice daily (if doing well reduce BREO at next visit) - Checking CBC with diff, BNP, Ddimer and COVID testing  - FU televisit in 1 week

## 2019-03-05 NOTE — Progress Notes (Signed)
@Patient  ID: Felicia Herrera, female    DOB: [birth year], 63 y.o.   MRN: [id number]  Chief Complaint  Patient presents with  . Follow-up    pt states she is feeling well currently and is at baseline, but notes that until approx 1 wk ago she had increased sob, fatigue, shakiness with standing and exertion.      Referring provider: [name], [name], MD  HPI: 63 year old female, former smoker quit in 2007. PMH significant for hypertension, stage 2 moderate COPD, left lung adenocarcinoma stage  2, GERD, primary hypothyroidism, chronic pain disorder. Patient of Dr. Chase Caller, last seen on 11/07/2017. CAT score at follow-up, if continuing to do well reduce BREO.   03/05/2019  Patient presents today for a regular follow-up.  She has not been seen in over a year.  She is doing well today but reports increased shortness of breath and decreased stamina x1 month.  Also reports head pressure on 5 different occurrences.  These symptoms have since resolved 1 week ago.  She did notify her PCP and was told to follow-up with pulmonary.  She is feeling much better today.  She typically always has some cough with mucus production.  Reports that her cough today is productive with yellow/green mucus. Taking Delsym at night.  She reports chronic right side pleuritic pain which is not new.  She denies chest tightness or wheezing.  She has not checked her temperature but reports evening chills/sweats.  He has not been tested for COVID.  CAT COPD Symptom & Quality of Life Score (GSK trademark) 0 is no burden. 5 is highest burden 11/07/2017  03/05/2019   Never Cough -> Cough all the time 2 3  No phlegm in chest -> Chest is full of phlegm 2 3  No chest tightness -> Chest feels very tight 1 0  No dyspnea for 1 flight stairs/hill -> Very dyspneic for 1 flight of stairs 2 2  No limitations for ADL at home -> Very limited with ADL at home 1 2  Confident leaving home -> Not at all confident leaving home 1 2  Sleep  soundly -> Do not sleep soundly because of lung condition 0 0  Lots of Energy -> No energy at all 1 0  TOTAL Score (max 40)  10 12    No Known Allergies  Immunization History  Administered Date(s) Administered  . Influenza,inj,Quad PF,6+ Mos 03/21/2016, 02/22/2017    Past Medical History:  Diagnosis Date  . Adenocarcinoma of left lung, stage 2 ([place]) 12/27/2016  . Cancer [facility]    Lun cancer: Right Dx 2008, s/p lobectomy. Left Dx 09/2016  . COPD (chronic obstructive pulmonary disease) ([place])   . Encounter for antineoplastic chemotherapy 12/27/2016  . History of radiation therapy 01/24/17-03/08/17   left lung 2 Gy in 30 fractions  . Hyperlipidemia   . Hypertension   . Stroke [facility])     Tobacco History: Social History   Tobacco Use  Smoking Status Former Smoker  . Packs/day: 1.00  . Years: 30.00  . Pack years: 30.00  . Types: Cigarettes  . Quit date: 06/21/2005  . Years since quitting: 13.7  Smokeless Tobacco Never Used   Counseling given: Not Answered   Outpatient Medications Prior to Visit  Medication Sig Dispense Refill  . aspirin EC 81 MG tablet Take 81 mg by mouth daily.    Marland Kitchen atorvastatin (LIPITOR) 40 MG tablet TAKE 1 TABLET BY MOUTH  DAILY 90 tablet 3  . betamethasone  dipropionate (DIPROLENE) 0.05 % cream Apply topically daily as needed. 30 g 2  . BREO ELLIPTA 200-25 MCG/INH AEPB USE 1 INHALATION BY MOUTH  DAILY  AT THE SAME TIME  EACH DAY 180 each 0  . clotrimazole (LOTRIMIN) 1 % cream Apply 1 application topically 2 (two) times daily. (Patient taking differently: Apply 1 application topically 2 (two) times daily as needed. ) 30 g 2  . fluticasone (FLONASE) 50 MCG/ACT nasal spray Place 1 spray into both nostrils 2 (two) times daily. 16 g 6  . levothyroxine (SYNTHROID, LEVOTHROID) 75 MCG tablet TAKE 1 TABLET BY MOUTH  DAILY BEFORE BREAKFAST 90 tablet 3  . nystatin cream (MYCOSTATIN) Apply 1 application topically 2 (two) times daily. (Patient taking differently: Apply 1  application topically 2 (two) times daily as needed. ) 30 g 0  . omeprazole (PRILOSEC) 20 MG capsule TAKE 1 CAPSULE BY MOUTH  DAILY 90 capsule 3  . oxyCODONE-acetaminophen (PERCOCET/ROXICET) 5-325 MG tablet Take 1 tablet by mouth daily as needed for severe pain. 30 tablet 0  . prochlorperazine (COMPAZINE) 10 MG tablet Take 1 tablet (10 mg total) by mouth every 6 (six) hours as needed for nausea or vomiting. 30 tablet 0   Facility-Administered Medications Prior to Visit  Medication Dose Route Frequency Provider Last Rate Last Dose  . sodium chloride flush (NS) 0.9 % injection 10 mL  10 mL Intracatheter PRN Curt Bears, MD   10 mL at 06/22/17 1747    Review of Systems  Review of Systems  Constitutional: Positive for chills and fatigue. Negative for fever.  HENT: Negative.   Respiratory: Positive for cough and shortness of breath. Negative for chest tightness and wheezing.        Chronic right pleuritic pain  Cardiovascular: Negative.    Physical Exam  BP 134/76 (BP Location: Left Arm, Cuff Size: Normal)   Pulse 93   Ht 5\' 2"  (1.575 m)   Wt 151 lb 3.2 oz (68.6 kg)   SpO2 96%   BMI 27.65 kg/m  Physical Exam Constitutional:      General: She is not in acute distress.    Appearance: Normal appearance. She is not toxic-appearing.  HENT:     Head: Normocephalic and atraumatic.  Neck:     Musculoskeletal: Normal range of motion and neck supple.  Cardiovascular:     Rate and Rhythm: Normal rate and regular rhythm.  Pulmonary:     Effort: No respiratory distress.     Breath sounds: Normal breath sounds. No wheezing, rhonchi or rales.     Comments: Mild-moderate dyspnea with talking Lungs CTA  Musculoskeletal: Normal range of motion.  Skin:    General: Skin is warm and dry.  Neurological:     General: No focal deficit present.     Mental Status: She is alert and oriented to person, place, and time. Mental status is at baseline.  Psychiatric:        Mood and Affect: Mood  normal.        Behavior: Behavior normal.        Thought Content: Thought content normal.        Judgment: Judgment normal.      Lab Results:  CBC    Component Value Date/Time   WBC 8.2 01/15/2019 1114   WBC 10.5 07/25/2018 1637   RBC 4.17 01/15/2019 1114   HGB 10.5 (L) 01/15/2019 1114   HGB 11.5 (L) 06/22/2017 1335   HCT 34.1 (L) 01/15/2019 1114   HCT  35.5 06/22/2017 1335   PLT 456 (H) 01/15/2019 1114   PLT 332 06/22/2017 1335   MCV 81.8 01/15/2019 1114   MCV 83.5 06/22/2017 1335   MCH 25.2 (L) 01/15/2019 1114   MCHC 30.8 01/15/2019 1114   RDW 14.9 01/15/2019 1114   RDW 14.8 (H) 06/22/2017 1335   LYMPHSABS 1.0 01/15/2019 1114   LYMPHSABS 0.7 (L) 06/22/2017 1335   MONOABS 0.5 01/15/2019 1114   MONOABS 0.6 06/22/2017 1335   EOSABS 0.1 01/15/2019 1114   EOSABS 0.1 06/22/2017 1335   BASOSABS 0.0 01/15/2019 1114   BASOSABS 0.1 06/22/2017 1335    BMET    Component Value Date/Time   NA 142 01/15/2019 1114   NA 142 06/22/2017 1335   K 3.2 (L) 01/15/2019 1114   K 3.9 06/22/2017 1335   CL 101 01/15/2019 1114   CO2 30 01/15/2019 1114   CO2 26 06/22/2017 1335   GLUCOSE 98 01/15/2019 1114   GLUCOSE 100 06/22/2017 1335   BUN 11 01/15/2019 1114   BUN 13.6 06/22/2017 1335   CREATININE 0.78 01/15/2019 1114   CREATININE 0.7 06/22/2017 1335   CALCIUM 8.4 (L) 01/15/2019 1114   CALCIUM 9.4 06/22/2017 1335   GFRNONAA >60 01/15/2019 1114   GFRAA >60 01/15/2019 1114    BNP No results found for: BNP  ProBNP No results found for: PROBNP  Imaging: No results found.   Assessment & Plan:   Stage 2 moderate COPD by GOLD classification ([place]) - Suspected COPD exacerbation with productive cough/SOB - Lungs clear on exam. No wheezing.  - RX doxycycline 1 tab twice daily x 7 days; oral steroid not indicated at this time  - Advised Mucinex twice daily for 1-2 weeks until congestion improved  - Continue Breo 200 two puffs twice daily (if doing well reduce BREO at next visit)  - Checking CBC with diff, BNP, Ddimer and COVID testing  - FU televisit in 1 week    Martyn Ehrich, NP 03/05/2019

## 2019-03-05 NOTE — Patient Instructions (Addendum)
Treating you for COPD exacerbation today, checking labs and covid testing to rule out other causes of shortness of breath  Orders: Labs today COVID testing ([facility])  Recommendations: Mucinex twice daily for 1-2 weeks until congestion improved   RX: Doxycycline 1 tab twice daily x 7 days  Follow-up: Televisit 1 week with NP  4-6 month office visit with Dr. Donnie Mesa

## 2019-03-06 ENCOUNTER — Ambulatory Visit (INDEPENDENT_AMBULATORY_CARE_PROVIDER_SITE_OTHER): Payer: Medicare Other

## 2019-03-06 DIAGNOSIS — Z23 Encounter for immunization: Secondary | ICD-10-CM

## 2019-03-06 DIAGNOSIS — R6889 Other general symptoms and signs: Secondary | ICD-10-CM | POA: Diagnosis not present

## 2019-03-06 NOTE — Progress Notes (Signed)
Need CTA to r/o PE d/t elevated ddimer. And echocardiogram if not done in the last 2 years.

## 2019-03-07 ENCOUNTER — Telehealth: Payer: Self-pay | Admitting: Internal Medicine

## 2019-03-07 ENCOUNTER — Other Ambulatory Visit: Payer: Self-pay | Admitting: *Deleted

## 2019-03-07 DIAGNOSIS — R0602 Shortness of breath: Secondary | ICD-10-CM

## 2019-03-07 DIAGNOSIS — R7989 Other specified abnormal findings of blood chemistry: Secondary | ICD-10-CM

## 2019-03-07 NOTE — Telephone Encounter (Signed)
Pt aware of results. CT angio and echocardiogram ordered. Nothing further needed.

## 2019-03-08 ENCOUNTER — Telehealth: Payer: Self-pay | Admitting: Primary Care

## 2019-03-08 DIAGNOSIS — R0602 Shortness of breath: Secondary | ICD-10-CM

## 2019-03-08 NOTE — Telephone Encounter (Signed)
Called and spoke to [place] with [facility]. She is needing a creatinine lab prior to pt's CT angio, specifically requesting an I-stat POCT, this is so the lab can be done at CT and the pt doesn't have to go to a lab. This order has been placed. Advised Danda to call back if she cannot see the lab for them to perform. Pt verbalized understanding and denied any further questions or concerns at this time.

## 2019-03-09 ENCOUNTER — Other Ambulatory Visit: Payer: Self-pay

## 2019-03-09 DIAGNOSIS — R6889 Other general symptoms and signs: Secondary | ICD-10-CM | POA: Diagnosis not present

## 2019-03-09 DIAGNOSIS — Z20822 Contact with and (suspected) exposure to covid-19: Secondary | ICD-10-CM

## 2019-03-10 LAB — NOVEL CORONAVIRUS, NAA: SARS-CoV-2, NAA: NOT DETECTED

## 2019-03-12 ENCOUNTER — Ambulatory Visit (HOSPITAL_COMMUNITY): Payer: Medicare Other

## 2019-03-12 ENCOUNTER — Ambulatory Visit (HOSPITAL_COMMUNITY)
Admission: RE | Admit: 2019-03-12 | Discharge: 2019-03-12 | Disposition: A | Discharge status: Home / Self Care | Payer: Medicare Other | Source: Ambulatory Visit | Attending: Primary Care | Admitting: Primary Care

## 2019-03-12 ENCOUNTER — Ambulatory Visit: Payer: Medicare Other | Admitting: Primary Care

## 2019-03-12 ENCOUNTER — Encounter (HOSPITAL_COMMUNITY): Payer: Self-pay

## 2019-03-12 ENCOUNTER — Encounter (HOSPITAL_COMMUNITY): Payer: Self-pay | Admitting: Cardiology

## 2019-03-12 ENCOUNTER — Telehealth: Payer: Self-pay | Admitting: Primary Care

## 2019-03-12 ENCOUNTER — Other Ambulatory Visit: Payer: Self-pay

## 2019-03-12 DIAGNOSIS — R7989 Other specified abnormal findings of blood chemistry: Secondary | ICD-10-CM | POA: Insufficient documentation

## 2019-03-12 DIAGNOSIS — R6889 Other general symptoms and signs: Secondary | ICD-10-CM | POA: Diagnosis not present

## 2019-03-12 DIAGNOSIS — R0602 Shortness of breath: Secondary | ICD-10-CM | POA: Diagnosis not present

## 2019-03-12 LAB — POCT I-STAT CREATININE: Creatinine, Ser: 0.4 mg/dL — ABNORMAL LOW (ref 0.44–1.00)

## 2019-03-12 MED ORDER — SODIUM CHLORIDE (PF) 0.9 % IJ SOLN
INTRAMUSCULAR | Status: AC
  Filled 2019-03-12: qty 50

## 2019-03-12 MED ORDER — IOHEXOL 350 MG/ML SOLN
100.0000 mL | Freq: Once | INTRAVENOUS | Status: AC | PRN
  Administered 2019-03-12: 100 mL via INTRAVENOUS

## 2019-03-12 MED ORDER — HEPARIN SOD (PORK) LOCK FLUSH 100 UNIT/ML IV SOLN
500.0000 [IU] | Freq: Once | INTRAVENOUS | Status: AC
  Administered 2019-03-12: 14:00:00 500 [IU] via INTRAVENOUS

## 2019-03-12 MED ORDER — HEPARIN SOD (PORK) LOCK FLUSH 100 UNIT/ML IV SOLN
INTRAVENOUS | Status: AC
  Filled 2019-03-12: qty 5

## 2019-03-12 NOTE — Telephone Encounter (Signed)
Technician was unable to get a good picture during Echocardiogram. Canceling and will consider other imaging options. May need transesophageal.

## 2019-03-12 NOTE — Progress Notes (Signed)
I tried calling patient with CTA results. She has what radiology feels looks like PNA left lower lobe. I had started her on doxycycline treatment, how is she feeling? Plan repeat CT chest wo contrast in 4-6 weeks. Please order.

## 2019-03-12 NOTE — Progress Notes (Signed)
Patient had no windows for echocardiogram, called and spoke with ordering doc Geraldo Pitter NP. She understood and advised to cancel echo and send patient home, she said she would contact patient and arrange another test.

## 2019-03-13 ENCOUNTER — Ambulatory Visit (INDEPENDENT_AMBULATORY_CARE_PROVIDER_SITE_OTHER): Payer: Medicare Other | Admitting: Primary Care

## 2019-03-13 ENCOUNTER — Encounter: Payer: Self-pay | Admitting: Primary Care

## 2019-03-13 ENCOUNTER — Telehealth: Payer: Self-pay | Admitting: Primary Care

## 2019-03-13 ENCOUNTER — Other Ambulatory Visit: Payer: Self-pay | Admitting: *Deleted

## 2019-03-13 DIAGNOSIS — J181 Lobar pneumonia, unspecified organism: Secondary | ICD-10-CM | POA: Diagnosis not present

## 2019-03-13 DIAGNOSIS — Z902 Acquired absence of lung [part of]: Secondary | ICD-10-CM | POA: Diagnosis not present

## 2019-03-13 DIAGNOSIS — J189 Pneumonia, unspecified organism: Secondary | ICD-10-CM

## 2019-03-13 MED ORDER — GUAIFENESIN ER 600 MG PO TB12: 600.0000 mg | ORAL_TABLET | Freq: Two times a day (BID) | ORAL | 2 refills | Status: AC

## 2019-03-13 MED ORDER — LEVOFLOXACIN 500 MG PO TABS: 500.0000 mg | ORAL_TABLET | Freq: Every day | ORAL | 0 refills | Status: DC

## 2019-03-13 NOTE — Telephone Encounter (Signed)
CTA resulted. Nothing further needed.

## 2019-03-13 NOTE — Patient Instructions (Addendum)
CTA showed pneumonia   Orders: Flutter valve - use three times a day for chest congestion Needs CT chest in 4-6 weeks (already ordered)  RX: Levaquin 1 tab daily x 7 days  Mucinex 1 tab twice daily   Follow-up 10-14 days televisit with Eustaquio Maize NP

## 2019-03-13 NOTE — Progress Notes (Signed)
Virtual Visit via Telephone Note  I connected with Felicia Herrera on 03/13/19 at 11:45 AM EDT by telephone and verified that I am speaking with the correct person using two identifiers.  Location: Patient: Home  Provider: Office   I discussed the limitations, risks, security and privacy concerns of performing an evaluation and management service by telephone and the availability of in person appointments. I also discussed with the patient that there may be a patient responsible charge related to this service. The patient expressed understanding and agreed to proceed.   History of Present Illness: 63 year old female, former smoker quit in 2007. 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 Patient of Dr. Chase Caller, last seen on 11/07/2017. CAT score at follow-up, if continuing to do well reduce BREO.   Previous [facility] pulmonary encounters: 03/05/2019  Patient presents today for a regular follow-up.  She has not been seen in over a year.  She is doing well today but reports increased shortness of breath and decreased stamina x1 month.  Also reports head pressure on 5 different occurrences.  These symptoms have since resolved 1 week ago.  She did notify her PCP and was told to follow-up with pulmonary.  She is feeling much better today.  She typically always has some cough with mucus production.  Reports that her cough today is productive with yellow/green mucus. Taking Delsym at night.  She reports chronic right side pleuritic pain which is not new.  She denies chest tightness or wheezing.  She has not checked her temperature but reports evening chills/sweats.  He has not been tested for COVID.  03/13/2019 Patient contacted today for 1 week follow-up televisit. Hx non-small cell lung cancer of left upper lobe diagnosed in 2018, s/p concurrent chemoradiation. Following  with Dr. Earlie Server, last seen on 01/17/19. She was treated for questionable pneumonia with Levaquin back in July 2020. She had repeat CT scan of the chest performed ,  images reviewed by Dr. Julien Nordmann and discussed. Her scan showed no concerning findings for disease progression except for the multiple fracture on the left side ribs secondary to persistent cough. CTA on 03/12/19 was negative for PE but showed a new apparent pneumonia in the posterior segment of LLL. Multiple nodular opacities are noted on the left, several slightly larger compared to the previous study. Question foci pneumonia versus possible neoplasm spread. Recommending repeat CT after treatment for pneumonia in 4-6 weeks. She continues to have productive cough with yellow-green mucus. Associated fatigue and dyspnea on exertion. She has not taken her temperature but reports chills/night sweats. She has one day left of doxycyline. Not currently taking mucinex. Covid testing was negative.  Observations: - No significant shortness of breath or wheezing noted during phone conversation - Moderate cough  Assessment and Plan:  Left lower lobe pneumonia: - Continues to have productive cough with associated fatigue and DOE - CTA showed apparent pneumonia in the posterior segment of LLL. Multiple nodular opacities are noted on the left. - RX levaquin 500mg  x 7 days to start today  - Start mucinex twice daily and flutter valve - Needs repeat CT chest wo contrast in 4-6 weeks to ensure resolution, may need either PET scan or bronchoscopy - Ordering sputum cultures  - COVID negative   Follow Up Instructions:   - 10-14 day televisit with NP  I discussed the assessment and treatment plan with  the patient. The patient was provided an opportunity to ask questions and all were answered. The patient agreed with the plan and demonstrated an understanding of the instructions.   The patient was advised to call back or seek an in-person evaluation if  the symptoms worsen or if the condition fails to improve as anticipated.  I provided 26 minutes of non-face-to-face time during this encounter.   Martyn Ehrich, NP

## 2019-03-14 ENCOUNTER — Other Ambulatory Visit: Payer: Self-pay | Admitting: *Deleted

## 2019-03-14 ENCOUNTER — Other Ambulatory Visit: Payer: Medicare Other

## 2019-03-14 DIAGNOSIS — Z902 Acquired absence of lung [part of]: Secondary | ICD-10-CM | POA: Diagnosis not present

## 2019-03-14 MED ORDER — FLUTTER DEVI: 0 refills | Status: AC

## 2019-03-16 DIAGNOSIS — J189 Pneumonia, unspecified organism: Secondary | ICD-10-CM | POA: Diagnosis not present

## 2019-03-16 DIAGNOSIS — J449 Chronic obstructive pulmonary disease, unspecified: Secondary | ICD-10-CM | POA: Diagnosis not present

## 2019-03-16 DIAGNOSIS — C3492 Malignant neoplasm of unspecified part of left bronchus or lung: Secondary | ICD-10-CM | POA: Diagnosis not present

## 2019-03-16 DIAGNOSIS — J4530 Mild persistent asthma, uncomplicated: Secondary | ICD-10-CM | POA: Diagnosis not present

## 2019-03-16 DIAGNOSIS — J310 Chronic rhinitis: Secondary | ICD-10-CM | POA: Diagnosis not present

## 2019-03-19 NOTE — Progress Notes (Unsigned)
Sputum grew out pseudomonas. Levaquin is sensitive to this particular bacteria. Continue antibiotic course until complete. Follow-up as previously recommended with CT chest

## 2019-03-19 NOTE — Progress Notes (Signed)
Sputum grew out pseudomonas. Levaquin is sensitive to this particular bacteria. Continue antibiotic course until complete. Follow-up as previously recommended with CT chest

## 2019-03-27 ENCOUNTER — Encounter: Payer: Self-pay | Admitting: Primary Care

## 2019-03-27 ENCOUNTER — Ambulatory Visit (INDEPENDENT_AMBULATORY_CARE_PROVIDER_SITE_OTHER): Payer: Medicare Other | Admitting: Primary Care

## 2019-03-27 ENCOUNTER — Telehealth: Payer: Self-pay | Admitting: Internal Medicine

## 2019-03-27 ENCOUNTER — Other Ambulatory Visit: Payer: Self-pay

## 2019-03-27 DIAGNOSIS — J189 Pneumonia, unspecified organism: Secondary | ICD-10-CM | POA: Diagnosis not present

## 2019-03-27 MED ORDER — LEVOFLOXACIN 500 MG PO TABS: 500.0000 mg | ORAL_TABLET | Freq: Every day | ORAL | 0 refills | Status: DC

## 2019-03-27 NOTE — Patient Instructions (Signed)
Continue mucinex twice daily Use flutter valve three times a day (5-10 breaths)  Extending Levaquin course for additional 3 days  Follow-up after CT results

## 2019-03-27 NOTE — Progress Notes (Signed)
Virtual Visit via Telephone Note  I connected with Felicia Herrera on 03/27/19 at 11:30 AM EDT by telephone and verified that I am speaking with the correct person using two identifiers.  Location: Patient: Home Provider: Office   I discussed the limitations, risks, security and privacy concerns of performing an evaluation and management service by telephone and the availability of in person appointments. I also discussed with the patient that there may be a patient responsible charge related to this service. The patient expressed understanding and agreed to proceed.   History of Present Illness: 63 year old female, former smoker quit in 2007. 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 Patient of Dr. Chase Caller, last seen on 11/07/2017. CAT score at follow-up, if continuing to do well reduce BREO.   Previous [facility] pulmonary encounters: 03/05/2019  Patient presents today for a regular follow-up.  She has not been seen in over a year.  She is doing well today but reports increased shortness of breath and decreased stamina x1 month.  Also reports head pressure on 5 different occurrences.  These symptoms have since resolved 1 week ago.  She did notify her PCP and was told to follow-up with pulmonary.  She is feeling much better today.  She typically always has some cough with mucus production.  Reports that her cough today is productive with yellow/green mucus. Taking Delsym at night.  She reports chronic right side pleuritic pain which is not new.  She denies chest tightness or wheezing.  She has not checked her temperature but reports evening chills/sweats.  He has not been tested for COVID.  03/13/2019 Patient contacted today for 1 week follow-up televisit. Hx non-small cell lung cancer of left upper lobe diagnosed in 2018, s/p concurrent chemoradiation. Following with  Dr. Earlie Server, last seen on 01/17/19. She was treated for questionable pneumonia with Levaquin back in July 2020. She had repeat CT scan of the chest performed ,  images reviewed by Dr. Julien Nordmann and discussed. Her scan showed no concerning findings for disease progression except for the multiple fracture on the left side ribs secondary to persistent cough. CTA on 03/12/19 was negative for PE but showed a new apparent pneumonia in the posterior segment of LLL. Multiple nodular opacities are noted on the left, several slightly larger compared to the previous study. Question foci pneumonia versus possible neoplasm spread. Recommending repeat CT after treatment for pneumonia in 4-6 weeks. She continues to have productive cough with yellow-green mucus. Associated fatigue and dyspnea on exertion. She has not taken her temperature but reports chills/night sweats. She has one day left of doxycyline. Not currently taking mucinex. Covid testing was negative.  03/27/2019 Patient contacted today for two week follow-up visit. She has been feeling better but not great. She still has congested cough with green-yellow sputum. Continue mucinex twice daily. She has not been using her flutter valve as often as prescribed. Completed Levaquin 500mg  x7 days. Denies fever. Covid negative.    Observations/Objective:  - No shortness of breath, cough or wheezing  Assessment and Plan:  Left lower lobe pneumonia: - Dx 03/12/19 on CTA  - Some improvement but still has productive purulent cough  - Sputum positive for pseudomonas  - Completed levaquin 500mg  x 7 days; sending in additional 3 days  - Advise Mucinex twice daily and flutter valve 2-3 times a day - Needs repeat CT chest wo  contrast in 4 weeks to ensure resolution   Follow Up Instructions:   - Follow up after CT chest or if symptoms worsen  I discussed the assessment and treatment plan with the patient. The patient was provided an opportunity to ask questions and all  were answered. The patient agreed with the plan and demonstrated an understanding of the instructions.   The patient was advised to call back or seek an in-person evaluation if the symptoms worsen or if the condition fails to improve as anticipated.  I provided 15 minutes of non-face-to-face time during this encounter.   Martyn Ehrich, NP

## 2019-03-27 NOTE — Telephone Encounter (Signed)
Spoke with the pt  She is asking how often to use flutter valve  I advised her AVS from today   Editor: Martyn Ehrich, NP (Nurse Practitioner)    Continue mucinex twice daily Use flutter valve three times a day (5-10 breaths)  Extending Levaquin course for additional 3 days  Follow-up after CT results      Pt verbalized understanding  Nothing further needed

## 2019-04-02 ENCOUNTER — Other Ambulatory Visit: Payer: Self-pay

## 2019-04-02 ENCOUNTER — Inpatient Hospital Stay: Payer: Medicare Other | Attending: Nurse Practitioner

## 2019-04-02 DIAGNOSIS — C3412 Malignant neoplasm of upper lobe, left bronchus or lung: Secondary | ICD-10-CM | POA: Diagnosis not present

## 2019-04-02 DIAGNOSIS — Z95828 Presence of other vascular implants and grafts: Secondary | ICD-10-CM

## 2019-04-02 DIAGNOSIS — Z452 Encounter for adjustment and management of vascular access device: Secondary | ICD-10-CM | POA: Diagnosis not present

## 2019-04-02 DIAGNOSIS — C3492 Malignant neoplasm of unspecified part of left bronchus or lung: Secondary | ICD-10-CM

## 2019-04-02 MED ORDER — HEPARIN SOD (PORK) LOCK FLUSH 100 UNIT/ML IV SOLN
250.0000 [IU] | Freq: Once | INTRAVENOUS | Status: AC
  Administered 2019-04-02: 250 [IU]
  Filled 2019-04-02: qty 5

## 2019-04-02 MED ORDER — SODIUM CHLORIDE 0.9% FLUSH
10.0000 mL | Freq: Once | INTRAVENOUS | Status: AC
  Administered 2019-04-02: 12:00:00 10 mL
  Filled 2019-04-02: qty 10

## 2019-04-09 LAB — RESPIRATORY CULTURE OR RESPIRATORY AND SPUTUM CULTURE
MICRO NUMBER:: 913843
RESULT:: NORMAL
SPECIMEN QUALITY:: ADEQUATE

## 2019-04-09 LAB — FUNGUS CULTURE W SMEAR
MICRO NUMBER:: 913842
SMEAR:: NONE SEEN
SPECIMEN QUALITY:: ADEQUATE

## 2019-04-12 ENCOUNTER — Telehealth: Payer: Self-pay | Admitting: Internal Medicine

## 2019-04-12 NOTE — Telephone Encounter (Signed)
Left message for patient to call back  

## 2019-04-13 ENCOUNTER — Encounter: Payer: Self-pay | Admitting: Primary Care

## 2019-04-13 ENCOUNTER — Ambulatory Visit (INDEPENDENT_AMBULATORY_CARE_PROVIDER_SITE_OTHER): Payer: Medicare Other | Admitting: Primary Care

## 2019-04-13 DIAGNOSIS — J189 Pneumonia, unspecified organism: Secondary | ICD-10-CM | POA: Diagnosis not present

## 2019-04-13 MED ORDER — CIPROFLOXACIN HCL 500 MG PO TABS: 500.0000 mg | ORAL_TABLET | Freq: Two times a day (BID) | ORAL | 0 refills | Status: DC

## 2019-04-13 MED ORDER — PREDNISONE 10 MG PO TABS: ORAL_TABLET | ORAL | 0 refills | Status: DC

## 2019-04-13 MED ORDER — ALBUTEROL SULFATE HFA 108 (90 BASE) MCG/ACT IN AERS: 2.0000 | INHALATION_SPRAY | Freq: Four times a day (QID) | RESPIRATORY_TRACT | 1 refills | Status: DC | PRN

## 2019-04-13 NOTE — Progress Notes (Addendum)
Virtual Visit via Telephone Note  I connected with Felicia Herrera on 04/13/19 at 10:30 AM EDT by telephone and verified that I am speaking with the correct person using two identifiers.  Location: Patient: Home Provider: Office   I discussed the limitations, risks, security and privacy concerns of performing an evaluation and management service by telephone and the availability of in person appointments. I also discussed with the patient that there may be a patient responsible charge related to this service. The patient expressed understanding and agreed to proceed.   History of Present Illness: 63 year old female, former smoker quit in 2007. 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 Patient of Dr. Chase Caller, last seen on 11/07/2017. CAT score at follow-up, if continuing to do well reduce BREO.   Previous [facility] pulmonary encounters: 03/05/2019  Patient presents today for a regular follow-up.  She has not been seen in over a year.  She is doing well today but reports increased shortness of breath and decreased stamina x1 month.  Also reports head pressure on 5 different occurrences.  These symptoms have since resolved 1 week ago.  She did notify her PCP and was told to follow-up with pulmonary.  She is feeling much better today.  She typically always has some cough with mucus production.  Reports that her cough today is productive with yellow/green mucus. Taking Delsym at night.  She reports chronic right side pleuritic pain which is not new.  She denies chest tightness or wheezing.  She has not checked her temperature but reports evening chills/sweats.  He has not been tested for COVID.  03/13/2019 Patient contacted today for 1 week follow-up televisit. Hx non-small cell lung cancer of left upper lobe diagnosed in 2018, s/p concurrent chemoradiation. Following with  Dr. Earlie Server, last seen on 01/17/19. She was treated for questionable pneumonia with Levaquin back in July 2020. She had repeat CT scan of the chest performed, images reviewed by Dr. Julien Nordmann and discussed. Her scan showed no concerning findings for disease progression except for the multiple fracture on the left side ribs secondary to persistent cough. CTA on 03/12/19 was negative for PE but showed a new apparent pneumonia in the posterior segment of LLL. Multiple nodular opacities are noted on the left, several slightly larger compared to the previous study. Question foci pneumonia versus possible neoplasm spread. Recommending repeat CT after treatment for pneumonia in 4-6 weeks. She continues to have productive cough with yellow-green mucus. Associated fatigue and dyspnea on exertion. She has not taken her temperature but reports chills/night sweats. She has one day left of doxycyline. Not currently taking mucinex. Covid testing was negative.  03/27/2019 Patient contacted today for two week follow-up visit. She has been feeling better but not great. She still has congested cough with green-yellow sputum. Continue mucinex twice daily. She has not been using her flutter valve as often as prescribed. Completed Levaquin 500mg  x7 days. Denies fever. Covid negative.   04/13/2019 Patient contacted today for acute televisit for increased shortness of breath and cough x 2 days. Cough is productive with green mucus. Associated chills and muscle aches. She was diagnosed with left lower lobe pneumonia in September and treated with Levqauin. Sputum positive for pseudomonas. States that she felt better initially after completing antibiotic but symptoms returned soon after. She is eating and drinking normally. Taking mucinex and using her flutter valve. She continues  using BREO daily as prescribed, she does not have rescue inhaler currently.    Observations/Objective:  - Mild shortness of breath and cough noted during  phone conversation - No audible wheezing, able to speak in mostly full sentences   Assessment and Plan:  Pneumonia - Treated in June for LLL pneumonia, sputum positive for pseudomonas completed Levaquin - Continue to have productive cough with green mucus and associated sob/chills/muscle aches - COVID negative - RX ciprofloxacin 500mg  BID x 10 days - Needs repeat CXR (unable to get until 10/26 or 10/27) - Continue mucinex and flutter valve twice daily; tylenol prn chills/muscle aches - Advised ED is symptoms acutely worsen   Moderate COPD - Continue Breo 1 puff once daily (does not appear she has been on LAMA) - RX albuterol hfa 2 puffs every 4-6 hours prn sob/wheezing  - Readdress management at follow-up   Follow Up Instructions:  - 1 week with Eustaquio Maize NP    I discussed the assessment and treatment plan with the patient. The patient was provided an opportunity to ask questions and all were answered. The patient agreed with the plan and demonstrated an understanding of the instructions.   The patient was advised to call back or seek an in-person evaluation if the symptoms worsen or if the condition fails to improve as anticipated.  I provided 22 minutes of non-face-to-face time during this encounter.   Martyn Ehrich, NP

## 2019-04-13 NOTE — Patient Instructions (Signed)
It was a pleasure speaking with you today, I am sorry that you are not feeling well  Recommendations: Continue Mucinex twice daily Continue flutter valve 2-3 times daily Continue Breo 1 puff daily Tylenol 650 mg every 6 hours as needed for fever/chills/muscle aches Make sure to stay well-hydrated Please get chest x-ray Monday or Tuesday at 11:30 AM  Rx: Ciprofloxacin 500 mg twice daily x10 days Prednisone taper as prescribed Albuterol 2 puffs every 4-6 hours as needed for shortness of breath or wheezing  Orders: Chest x-ray follow-up left lower lobe pneumonia  Follow-up: 1 week televisit with Eustaquio Maize NP

## 2019-04-13 NOTE — Telephone Encounter (Signed)
Spoke with patient She states she can't stop coughing, she is coughing up green/yellow sputum. She is short of breath, but doesn't know where her pulse ox is to check levels, and is having body aches.  Patient doesn't know how to use her mychart app Televisit made with BW per patient request 04/13/19

## 2019-04-19 ENCOUNTER — Inpatient Hospital Stay: Payer: Medicare Other

## 2019-04-19 ENCOUNTER — Other Ambulatory Visit: Payer: Self-pay

## 2019-04-19 ENCOUNTER — Ambulatory Visit (HOSPITAL_COMMUNITY)
Admission: RE | Admit: 2019-04-19 | Discharge: 2019-04-19 | Disposition: A | Discharge status: Home / Self Care | Payer: Medicare Other | Source: Ambulatory Visit | Attending: Primary Care | Admitting: Primary Care

## 2019-04-19 DIAGNOSIS — C3412 Malignant neoplasm of upper lobe, left bronchus or lung: Secondary | ICD-10-CM | POA: Diagnosis not present

## 2019-04-19 DIAGNOSIS — J189 Pneumonia, unspecified organism: Secondary | ICD-10-CM | POA: Diagnosis not present

## 2019-04-19 DIAGNOSIS — C3492 Malignant neoplasm of unspecified part of left bronchus or lung: Secondary | ICD-10-CM | POA: Diagnosis not present

## 2019-04-19 DIAGNOSIS — Z452 Encounter for adjustment and management of vascular access device: Secondary | ICD-10-CM | POA: Diagnosis not present

## 2019-04-19 DIAGNOSIS — C3490 Malignant neoplasm of unspecified part of unspecified bronchus or lung: Secondary | ICD-10-CM

## 2019-04-19 LAB — CMP (CANCER CENTER ONLY)
ALT: 13 U/L (ref 0–44)
AST: 11 U/L — ABNORMAL LOW (ref 15–41)
Albumin: 3.7 g/dL (ref 3.5–5.0)
Alkaline Phosphatase: 114 U/L (ref 38–126)
Anion gap: 10 (ref 5–15)
BUN: 20 mg/dL (ref 8–23)
CO2: 26 mmol/L (ref 22–32)
Calcium: 9.2 mg/dL (ref 8.9–10.3)
Chloride: 105 mmol/L (ref 98–111)
Creatinine: 0.75 mg/dL (ref 0.44–1.00)
GFR, Est AFR Am: >60 mL/min (ref >60)
GFR, Estimated: >60 mL/min (ref >60)
Glucose, Bld: 118 mg/dL — ABNORMAL HIGH (ref 70–99)
Potassium: 3.8 mmol/L (ref 3.5–5.1)
Sodium: 141 mmol/L (ref 135–145)
Total Bilirubin: <0.2 mg/dL — ABNORMAL LOW (ref 0.3–1.2)
Total Protein: 7.5 g/dL (ref 6.5–8.1)

## 2019-04-19 LAB — CBC WITH DIFFERENTIAL (CANCER CENTER ONLY)
Abs Immature Granulocytes: 0.04 10*3/uL (ref 0.00–0.07)
Basophils Absolute: 0 10*3/uL (ref 0.0–0.1)
Basophils Relative: 0 %
Eosinophils Absolute: 0 10*3/uL (ref 0.0–0.5)
Eosinophils Relative: 0 %
HCT: 33.4 % — ABNORMAL LOW (ref 36.0–46.0)
Hemoglobin: 10.3 g/dL — ABNORMAL LOW (ref 12.0–15.0)
Immature Granulocytes: 0 %
Lymphocytes Relative: 8 %
Lymphs Abs: 0.9 10*3/uL (ref 0.7–4.0)
MCH: 24.5 pg — ABNORMAL LOW (ref 26.0–34.0)
MCHC: 30.8 g/dL (ref 30.0–36.0)
MCV: 79.3 fL — ABNORMAL LOW (ref 80.0–100.0)
Monocytes Absolute: 0.3 10*3/uL (ref 0.1–1.0)
Monocytes Relative: 2 %
Neutro Abs: 10.2 10*3/uL — ABNORMAL HIGH (ref 1.7–7.7)
Neutrophils Relative %: 90 %
Platelet Count: 476 10*3/uL — ABNORMAL HIGH (ref 150–400)
RBC: 4.21 MIL/uL (ref 3.87–5.11)
RDW: 16.3 % — ABNORMAL HIGH (ref 11.5–15.5)
WBC Count: 11.4 10*3/uL — ABNORMAL HIGH (ref 4.0–10.5)
nRBC: 0 % (ref 0.0–0.2)

## 2019-04-20 NOTE — Progress Notes (Signed)
Please let patient know CT chest showed nearly resolved pneumonia, this is good news and reassuring. Also showed emphysema and atherosclerosis. No follow-up needed.

## 2019-04-23 ENCOUNTER — Ambulatory Visit (INDEPENDENT_AMBULATORY_CARE_PROVIDER_SITE_OTHER): Payer: Medicare Other | Admitting: Primary Care

## 2019-04-23 ENCOUNTER — Encounter: Payer: Medicare Other | Admitting: Primary Care

## 2019-04-23 ENCOUNTER — Encounter: Payer: Self-pay | Admitting: Primary Care

## 2019-04-23 ENCOUNTER — Inpatient Hospital Stay: Payer: Medicare Other | Attending: Nurse Practitioner | Admitting: Internal Medicine

## 2019-04-23 ENCOUNTER — Encounter: Payer: Self-pay | Admitting: Internal Medicine

## 2019-04-23 ENCOUNTER — Other Ambulatory Visit: Payer: Self-pay

## 2019-04-23 ENCOUNTER — Telehealth: Payer: Self-pay | Admitting: Internal Medicine

## 2019-04-23 VITALS — BP 139/62 | HR 81 | Temp 98.2°F | Resp 18 | Ht 62.0 in | Wt 151.4 lb

## 2019-04-23 DIAGNOSIS — J449 Chronic obstructive pulmonary disease, unspecified: Secondary | ICD-10-CM | POA: Insufficient documentation

## 2019-04-23 DIAGNOSIS — C3492 Malignant neoplasm of unspecified part of left bronchus or lung: Secondary | ICD-10-CM

## 2019-04-23 DIAGNOSIS — Z79899 Other long term (current) drug therapy: Secondary | ICD-10-CM | POA: Diagnosis not present

## 2019-04-23 DIAGNOSIS — Z8673 Personal history of transient ischemic attack (TIA), and cerebral infarction without residual deficits: Secondary | ICD-10-CM | POA: Diagnosis not present

## 2019-04-23 DIAGNOSIS — C3490 Malignant neoplasm of unspecified part of unspecified bronchus or lung: Secondary | ICD-10-CM

## 2019-04-23 DIAGNOSIS — Z923 Personal history of irradiation: Secondary | ICD-10-CM | POA: Diagnosis not present

## 2019-04-23 DIAGNOSIS — Z7982 Long term (current) use of aspirin: Secondary | ICD-10-CM | POA: Insufficient documentation

## 2019-04-23 DIAGNOSIS — Z9221 Personal history of antineoplastic chemotherapy: Secondary | ICD-10-CM | POA: Insufficient documentation

## 2019-04-23 DIAGNOSIS — I10 Essential (primary) hypertension: Secondary | ICD-10-CM | POA: Insufficient documentation

## 2019-04-23 DIAGNOSIS — C3412 Malignant neoplasm of upper lobe, left bronchus or lung: Secondary | ICD-10-CM | POA: Insufficient documentation

## 2019-04-23 DIAGNOSIS — Z452 Encounter for adjustment and management of vascular access device: Secondary | ICD-10-CM | POA: Diagnosis not present

## 2019-04-23 DIAGNOSIS — Z7951 Long term (current) use of inhaled steroids: Secondary | ICD-10-CM | POA: Diagnosis not present

## 2019-04-23 DIAGNOSIS — J151 Pneumonia due to Pseudomonas: Secondary | ICD-10-CM

## 2019-04-23 DIAGNOSIS — E785 Hyperlipidemia, unspecified: Secondary | ICD-10-CM | POA: Diagnosis not present

## 2019-04-23 NOTE — Addendum Note (Signed)
Addended by: Len Blalock on: 04/23/2019 04:10 PM   Modules accepted: Orders

## 2019-04-23 NOTE — Progress Notes (Signed)
.mo      [place] [phone]   [phone]  OFFICE PROGRESS NOTE  [name], [name], MD [place] [address]  DIAGNOSIS: Stage IIB (T2b, N1, M0) non-small cell lung cancer, adenocarcinoma presented with large left upper lobe lung mass in addition to left hilar lymphadenopathy diagnosed in April 2018. Her previous workup was done at [facility] in [facility]. There was insufficient material to perform the molecular studies.  GUARD[facility] 360 Molecular studies: BRCA2 N342f. Negative for EGFR, ALK, ROS1, BRAF mutations.  PRIOR THERAPY:  1) A course of concurrent chemoradiation with weekly carboplatin for AUC of 2 and paclitaxel 45 MG/M2. First dose 01/24/2017. Status post 6 cycles. 2) Consolidation immunotherapy with Imfinzi (Durvalumab) 10 mg/KG every 2 weeks status post 26 cycles.   CURRENT THERAPY: Observation.  INTERVAL HISTORY: CMarissah Vandemark63y.o. female returns to the clinic today for follow-up visit.  The patient is feeling fine today with no concerning complaints except for persistent cough of yellowish and green sputum.  She was seen by pulmonary medicine recently and treated with antibiotic in addition to Mucinex and inhalers.  She has no current chest pain or shortness of breath and no hemoptysis.  She denied having any recent weight loss or night sweats.  She has no nausea, vomiting, diarrhea or constipation.  She had repeat CT scan of the chest performed recently and she is here for evaluation and discussion of her scan results.  MEDICAL HISTORY: Past Medical History:  Diagnosis Date  . Adenocarcinoma of left lung, stage 2 ([facility] 12/27/2016  . Cancer ([facility]    Lun cancer: Right Dx 2008, s/p lobectomy. Left Dx 09/2016  . COPD (chronic obstructive pulmonary disease) ([facility]   . Encounter for antineoplastic chemotherapy 12/27/2016  . History of radiation therapy 01/24/17-03/08/17   left lung 2 Gy in 30  fractions  . Hyperlipidemia   . Hypertension   . Stroke ([facility]     ALLERGIES:  has No Known Allergies.  MEDICATIONS:  Current Outpatient Medications  Medication Sig Dispense Refill  . albuterol (VENTOLIN HFA) 108 (90 Base) MCG/ACT inhaler Inhale 2 puffs into the lungs every 6 (six) hours as needed for wheezing or shortness of breath. 6.7 g 1  . aspirin EC 81 MG tablet Take 81 mg by mouth daily.    .Marland Kitchenatorvastatin (LIPITOR) 40 MG tablet TAKE 1 TABLET BY MOUTH  DAILY 90 tablet 3  . betamethasone dipropionate (DIPROLENE) 0.05 % cream Apply topically daily as needed. 30 g 2  . BREO ELLIPTA 200-25 MCG/INH AEPB USE 1 INHALATION BY MOUTH  DAILY  AT THE SAME TIME  EACH DAY 180 each 0  . ciprofloxacin (CIPRO) 500 MG tablet Take 1 tablet (500 mg total) by mouth 2 (two) times daily. 20 tablet 0  . clotrimazole (LOTRIMIN) 1 % cream Apply 1 application topically 2 (two) times daily. (Patient taking differently: Apply 1 application topically 2 (two) times daily as needed. ) 30 g 2  . fluticasone (FLONASE) 50 MCG/ACT nasal spray Place 1 spray into both nostrils 2 (two) times daily. (Patient taking differently: Place 1 spray into both nostrils 2 (two) times daily as needed. ) 16 g 6  . guaiFENesin (MUCINEX) 600 MG 12 hr tablet Take 1 tablet (600 mg total) by mouth 2 (two) times daily. 60 tablet 2  . levothyroxine (SYNTHROID, LEVOTHROID) 75 MCG tablet TAKE 1 TABLET BY MOUTH  DAILY BEFORE BREAKFAST 90 tablet 3  .  nystatin cream (MYCOSTATIN) Apply 1 application topically 2 (two) times daily. (Patient taking differently: Apply 1 application topically 2 (two) times daily as needed. ) 30 g 0  . omeprazole (PRILOSEC) 20 MG capsule TAKE 1 CAPSULE BY MOUTH  DAILY 90 capsule 3  . oxyCODONE-acetaminophen (PERCOCET/ROXICET) 5-325 MG tablet Take 1 tablet by mouth daily as needed for severe pain. 30 tablet 0  . predniSONE (DELTASONE) 10 MG tablet Take 4 tabs po daily x 2 days; then 3 tabs for 2 days; then 2 tabs for 2 days;  then 1 tab for 2 days 20 tablet 0  . prochlorperazine (COMPAZINE) 10 MG tablet Take 1 tablet (10 mg total) by mouth every 6 (six) hours as needed for nausea or vomiting. 30 tablet 0  . Respiratory Therapy Supplies (FLUTTER) DEVI Use as directed 1 each 0   No current facility-administered medications for this visit.    Facility-Administered Medications Ordered in Other Visits  Medication Dose Route Frequency Provider Last Rate Last Dose  . sodium chloride flush (NS) 0.9 % injection 10 mL  10 mL Intracatheter PRN Curt Bears, MD   10 mL at 06/22/17 1747    SURGICAL HISTORY:  Past Surgical History:  Procedure Laterality Date  . BREAST BIOPSY     several. Denies Hx of breast cancer.  . IR FLUORO GUIDE PORT INSERTION RIGHT  02/04/2017  . IR US GUIDE VASC ACCESS RIGHT  02/04/2017  . THORACOTOMY/LOBECTOMY Right 2008  . TONSILLECTOMY  1960    REVIEW OF SYSTEMS:  A comprehensive review of systems was negative except for: Constitutional: positive for fatigue Respiratory: positive for cough and dyspnea on exertion   PHYSICAL EXAMINATION: General appearance: alert, cooperative, fatigued and no distress Head: Normocephalic, without obvious abnormality, atraumatic Neck: no adenopathy, no JVD, supple, symmetrical, trachea midline and thyroid not enlarged, symmetric, no tenderness/mass/nodules Lymph nodes: Cervical, supraclavicular, and axillary nodes normal. Resp: diminished breath sounds RLL and dullness to percussion RLL Back: symmetric, no curvature. ROM normal. No CVA tenderness. Cardio: regular rate and rhythm, S1, S2 normal, no murmur, click, rub or gallop GI: soft, non-tender; bowel sounds normal; no masses,  no organomegaly Extremities: extremities normal, atraumatic, no cyanosis or edema  ECOG PERFORMANCE STATUS: 1 - Symptomatic but completely ambulatory  Blood pressure 139/62, pulse 81, temperature 98.2 F (36.8 C), temperature source Temporal, resp. rate 18, height 5' 2" (1.575  m), weight 151 lb 6.4 oz (68.7 kg), SpO2 94 %.  LABORATORY DATA: Lab Results  Component Value Date   WBC 11.4 (H) 04/19/2019   HGB 10.3 (L) 04/19/2019   HCT 33.4 (L) 04/19/2019   MCV 79.3 (L) 04/19/2019   PLT 476 (H) 04/19/2019      Chemistry      Component Value Date/Time   NA 141 04/19/2019 1006   NA 142 06/22/2017 1335   K 3.8 04/19/2019 1006   K 3.9 06/22/2017 1335   CL 105 04/19/2019 1006   CO2 26 04/19/2019 1006   CO2 26 06/22/2017 1335   BUN 20 04/19/2019 1006   BUN 13.6 06/22/2017 1335   CREATININE 0.75 04/19/2019 1006   CREATININE 0.7 06/22/2017 1335      Component Value Date/Time   CALCIUM 9.2 04/19/2019 1006   CALCIUM 9.4 06/22/2017 1335   ALKPHOS 114 04/19/2019 1006   ALKPHOS 93 06/22/2017 1335   AST 11 (L) 04/19/2019 1006   AST 12 06/22/2017 1335   ALT 13 04/19/2019 1006   ALT 15 06/22/2017 1335   BILITOT <0.2 (  L) 04/19/2019 1006   BILITOT 0.25 06/22/2017 1335       RADIOGRAPHIC STUDIES: Ct Chest Wo Contrast  Result Date: 04/19/2019 CLINICAL DATA:  Left lung cancer. EXAM: CT CHEST WITHOUT CONTRAST TECHNIQUE: Multidetector CT imaging of the chest was performed following the standard protocol without IV contrast. COMPARISON:  03/12/2019 FINDINGS: Cardiovascular: Status post right pneumonectomy with sips of cardiomediastinal anatomy into the right hemithorax. Atherosclerotic calcification is noted in the wall of the thoracic aorta. Right Port-A-Cath tip is positioned in the distal SVC. Mediastinum/Nodes: Similar appearance of presumed post treatment change in the medial/parahilar left lung. Previously measured subcarinal node is 8 mm short axis today compared to 10 mm previously. AP window node measured previously at 10 mm short axis is stable at 10 mm short axis today (44/2). Lungs/Pleura: Right pneumonectomy. Small gas collections in the posterior right pleural space are stable. Tracheal stent device again noted. Consolidative change in the medial and  parahilar left lung is similar to prior and likely treatment related. Centrilobular and paraseptal emphysema evident. The patchy nodular airspace disease seen in the left base previously has improved substantially but not entirely resolved. No pleural effusion. Upper Abdomen: Unremarkable. Musculoskeletal: Multiple nonacute rib fractures are identified bilaterally. Similar appearance mild superior endplate compression deformity at T3. IMPRESSION: 1. The patchy and nodular airspace previously has disease seen at the left base nearly resolved in the interval. 2. Status post right pneumonectomy with stable appearance of upper normal mediastinal lymph nodes. 3.  Aortic Atherosclerois (ICD10-170.0) 4.  Emphysema. ([id number]) Electronically Signed   By: Misty Stanley M.D.   On: 04/19/2019 12:15    ASSESSMENT AND PLAN: This is a very pleasant 63 years old white female recently diagnosed with unresectable a stage IIB non-small cell lung cancer, adenocarcinoma. The patient underwent a course of concurrent chemoradiation with weekly carboplatin and paclitaxel status post 6 cycles.  She tolerated the treatment well and had partial response. She completed a course of consolidation treatment with immunotherapy with Imfinzi (Durvalumab) status post 26 cycles.  She tolerated her treatment well with no concerning adverse effects. The patient is feeling fine today with no concerning complaints except for mild cough and shortness of breath with exertion. She had repeat CT scan of the chest performed recently that showed no concerning findings for disease progression and there was resolution of the lung opacity seen on the previous scan. I recommended for her to continue on observation with repeat CT scan of the chest in 6 months. She was advised to call immediately if she has any concerning symptoms in the interval. The patient voices understanding of current disease status and treatment options and is in agreement with  the current care plan. All questions were answered. The patient knows to call the clinic with any problems, questions or concerns. We can certainly see the patient much sooner if necessary.  Disclaimer: This note was dictated with voice recognition software. Similar sounding words can inadvertently be transcribed and may not be corrected upon review.

## 2019-04-23 NOTE — Patient Instructions (Signed)
Glad you are feeling better Malachy Mood  Recommendations: Continue Breo daily as prescribed Continue Mucinex twice daily as needed for congestion  Orders: Please cancel chest xray  Follow-up 6 months with Dr. Chase Caller or sooner if needed

## 2019-04-23 NOTE — Progress Notes (Signed)
Virtual Visit via Telephone Note  I connected with Felicia Herrera on 04/23/19 at  3:30 PM EST by telephone and verified that I am speaking with the correct person using two identifiers.  Location: Patient: Home Provider: Office   I discussed the limitations, risks, security and privacy concerns of performing an evaluation and management service by telephone and the availability of in person appointments. I also discussed with the patient that there may be a patient responsible charge related to this service. The patient expressed understanding and agreed to proceed.   History of Present Illness: 63 year old female, former smoker quit in 2007. 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 Patient of Dr. Chase Caller. CAT score at follow-up, if continuing to do well reduce BREO.   Previous [facility] pulmonary encounters: 03/05/2019 - Annual follow-up Patient presents today for a regular follow-up.  She has not been seen in over a year.  She is doing well today but reports increased shortness of breath and decreased stamina x1 month.  Also reports head pressure on 5 different occurrences.  These symptoms have since resolved 1 week ago.  She did notify her PCP and was told to follow-up with pulmonary.  She is feeling much better today.  She typically always has some cough with mucus production.  Reports that her cough today is productive with yellow/green mucus. Taking Delsym at night.  She reports chronic right side pleuritic pain which is not new.  She denies chest tightness or wheezing.  She has not checked her temperature but reports evening chills/sweats.  He has not been tested for COVID.  03/13/2019- 1 week fu Patient contacted today for 1 week follow-up televisit. Hx non-small cell lung cancer of left upper lobe diagnosed in 2018, s/p  concurrent chemoradiation. Following with Dr. Earlie Server, last seen on 01/17/19. She was treated for questionable pneumonia with Levaquin back in July 2020. She had repeat CT scan of the chest performed, images reviewed by Dr. Julien Nordmann and discussed. Her scan showed no concerning findings for disease progression except for the multiple fracture on the left side ribs secondary to persistent cough. CTA on 03/12/19 was negative for PE but showed a new apparent pneumonia in the posterior segment of LLL. Multiple nodular opacities are noted on the left, several slightly larger compared to the previous study. Question foci pneumonia versus possible neoplasm spread. Recommending repeat CT after treatment for pneumonia in 4-6 weeks. She continues to have productive cough with yellow-green mucus. Associated fatigue and dyspnea on exertion. She has not taken her temperature but reports chills/night sweats. She has one day left of doxycyline. Not currently taking mucinex. Covid testing was negative.  03/27/2019- 2 week fu Patient contacted today for two week follow-up visit. She has been feeling better but not great. She still has congested cough with green-yellow sputum. Continue mucinex twice daily. She has not been using her flutter valve as often as prescribed. Completed Levaquin 500mg  x7 days. Denies fever. Covid negative.   04/13/2019- Acute televisit Patient contacted today for acute televisit for increased shortness of breath and cough x 2 days. Cough is productive with green mucus. Associated chills and muscle aches. She was diagnosed with left lower lobe pneumonia in September and treated with Levqauin. Sputum positive for pseudomonas. States that she felt better initially after completing antibiotic but symptoms returned soon after. She is eating and drinking  normally. Taking mucinex and using her flutter valve. She continues using BREO daily as prescribed, she does not have rescue inhaler currently. Sent in RX for  ciprofloxacin d/t history positive sputum cultures for pseudomonas.   04/23/2019 Patient contacted today for 1 week follow-up. She is feeling much better. States that her cough is not nearly as noticeable. Still getting up some yellow-green mucus which has improve in color. She has once dose left of Ciprofloxacin. No significant shortness of breath. Continues Breo 200 one puff daily. She had a repeat CT chest on 04/19/19 showed nearly resolved left lower lobe pneumonia, stable appearance of upper normal mediastinal lymph nodes. She saw Dr. Julien Nordmann today, no evidence of disease progression. Plan continue to observe, repeat CT CHEST in 6 months with oncology.   Observations/Objective:  - No shortness of breath, wheezing or cough noted during phone conversation  Assessment and Plan:  Pneumonia left lower lung - Resolved LLL opacity on most recent CT CHEST - Sputum positive for pseudomonas - Completes Ciprofloxacin on 04/24/19 - Continue mucinex twice daily as needed for congestion  Adenocarcinoma left lung - Following with oncology  - No evidence of disease progression - Repeat CT CHEST in 6 months and OV with Dr. Julien Nordmann   Moderate COPD - Stable, minimal symptoms  - Continue Breo 200 one puff daily, pn albuterol (changed from Advair d/t cost) - She has been on Spiriva in the past, but appears to have fallen off her medication list  - Recommend on follow-up decreasing BREO or changing to Anoro   Follow Up Instructions:   - FU in 6 months with Dr. Chase Caller or sooner if needed  I discussed the assessment and treatment plan with the patient. The patient was provided an opportunity to ask questions and all were answered. The patient agreed with the plan and demonstrated an understanding of the instructions.   The patient was advised to call back or seek an in-person evaluation if the symptoms worsen or if the condition fails to improve as anticipated.  I provided 18 minutes of  non-face-to-face time during this encounter.   Martyn Ehrich, NP

## 2019-04-23 NOTE — Progress Notes (Signed)
 Virtual Visit via Telephone Note  I connected with Felicia Herrera on 04/23/19 at  2:00 PM EST by telephone and verified that I am speaking with the correct person using two identifiers.     I discussed the limitations, risks, security and privacy concerns of performing an evaluation and management service by telephone and the availability of in person appointments. I also discussed with the patient that there may be a patient responsible charge related to this service. The patient expressed understanding and agreed to proceed.   History of Present Illness: 63 year old female, former smoker quit in 2007. 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 Patient of Dr. Geronimo. CAT score at follow-up, if continuing to do well reduce BREO.   Previous [facility] pulmonary encounters: 03/05/2019  Patient presents today for a regular follow-up.  She has not been seen in over a year.  She is doing well today but reports increased shortness of breath and decreased stamina x1 month.  Also reports head pressure on 5 different occurrences.  These symptoms have since resolved 1 week ago.  She did notify her PCP and was told to follow-up with pulmonary.  She is feeling much better today.  She typically always has some cough with mucus production.  Reports that her cough today is productive with yellow/green mucus. Taking Delsym at night.  She reports chronic right side pleuritic pain which is not new.  She denies chest tightness or wheezing.  She has not checked her temperature but reports evening chills/sweats.  He has not been tested for COVID.  03/13/2019 Patient contacted today for 1 week follow-up televisit. Hx non-small cell lung cancer of left upper lobe diagnosed in 2018, s/p concurrent chemoradiation. Following with Dr. Gatha, last seen on 01/17/19. She was treated for  questionable pneumonia with Levaquin  back in July 2020. She had repeat CT scan of the chest performed, images reviewed by Dr. Sherrod and discussed. Her scan showed no concerning findings for disease progression except for the multiple fracture on the left side ribs secondary to persistent cough. CTA on 03/12/19 was negative for PE but showed a new apparent pneumonia in the posterior segment of LLL. Multiple nodular opacities are noted on the left, several slightly larger compared to the previous study. Question foci pneumonia versus possible neoplasm spread. Recommending repeat CT after treatment for pneumonia in 4-6 weeks. She continues to have productive cough with yellow-green mucus. Associated fatigue and dyspnea on exertion. She has not taken her temperature but reports chills/night sweats. She has one day left of doxycyline. Not currently taking mucinex . Covid testing was negative.  03/27/2019 Patient contacted today for two week follow-up visit. She has been feeling better but not great. She still has congested cough with green-yellow sputum. Continue mucinex  twice daily. She has not been using her flutter valve as often as prescribed. Completed Levaquin  500mg  x7 days. Denies fever. Covid negative.   04/13/2019 Patient contacted today for acute televisit for increased shortness of breath and cough x 2 days. Cough is productive with green mucus. Associated chills and muscle aches. She was diagnosed with left lower lobe pneumonia in September and treated with Levqauin. Sputum positive for pseudomonas. States that she felt better initially after completing antibiotic but symptoms returned soon after. She is eating and drinking normally. Taking mucinex  and using her flutter valve. She continues using BREO daily as prescribed, she does  not have rescue inhaler currently. Sent in RX for ciprofloxacin  d/t history positive sputum cultures for pseudomonas.   04/23/2019   Repeat CT chest on 04/19/19 showed  nearly resolved left lower lobe pneumonia, stable appearance of upper normal mediastinal lymph nodes.      Observations/Objective:   Assessment and Plan:   Follow Up Instructions:    I discussed the assessment and treatment plan with the patient. The patient was provided an opportunity to ask questions and all were answered. The patient agreed with the plan and demonstrated an understanding of the instructions.   The patient was advised to call back or seek an in-person evaluation if the symptoms worsen or if the condition fails to improve as anticipated.   Almarie LELON Ferrari, NP

## 2019-04-23 NOTE — Telephone Encounter (Signed)
Scheduled appt per 11/2 los - gave patient AVS and calender per los.

## 2019-04-26 NOTE — Progress Notes (Signed)
lmtcb for pt.  

## 2019-04-27 ENCOUNTER — Telehealth: Payer: Self-pay | Admitting: Primary Care

## 2019-04-27 NOTE — Telephone Encounter (Signed)
Notes recorded by Collier Salina, RN on 04/26/2019 at 1:38 PM EST  lmtcb for pt.  ------   Notes recorded by Martyn Ehrich, NP on 04/20/2019 at 3:01 PM EDT  Please let patient know CT chest showed nearly resolved pneumonia, this is good news and reassuring. Also showed emphysema and atherosclerosis. No follow-up needed.    Spoke with patient about results. She verbalized understanding. Nothing further needed at time of call.

## 2019-05-21 ENCOUNTER — Telehealth: Payer: Self-pay | Admitting: Medical Oncology

## 2019-05-21 NOTE — Telephone Encounter (Signed)
Is it okay for her to get a shingles and tetanus vaccine from PCP? I told her it is okay per Dr Julien Nordmann to get the shingles and tetanus vaccines. She is under observation from [facility].

## 2019-05-24 ENCOUNTER — Other Ambulatory Visit: Payer: Self-pay | Admitting: Internal Medicine

## 2019-05-24 DIAGNOSIS — Z8709 Personal history of other diseases of the respiratory system: Secondary | ICD-10-CM

## 2019-05-31 ENCOUNTER — Other Ambulatory Visit: Payer: Self-pay

## 2019-06-01 ENCOUNTER — Encounter: Payer: Self-pay | Admitting: Family Medicine

## 2019-06-01 ENCOUNTER — Ambulatory Visit (INDEPENDENT_AMBULATORY_CARE_PROVIDER_SITE_OTHER): Payer: Medicare Other | Admitting: Family Medicine

## 2019-06-01 VITALS — BP 128/80 | HR 89 | Temp 95.2°F | Resp 20 | Ht 62.0 in | Wt 152.5 lb

## 2019-06-01 DIAGNOSIS — Z23 Encounter for immunization: Secondary | ICD-10-CM

## 2019-06-01 DIAGNOSIS — C3492 Malignant neoplasm of unspecified part of left bronchus or lung: Secondary | ICD-10-CM

## 2019-06-01 DIAGNOSIS — I700 Atherosclerosis of aorta: Secondary | ICD-10-CM

## 2019-06-01 DIAGNOSIS — E785 Hyperlipidemia, unspecified: Secondary | ICD-10-CM

## 2019-06-01 DIAGNOSIS — G894 Chronic pain syndrome: Secondary | ICD-10-CM

## 2019-06-01 DIAGNOSIS — E039 Hypothyroidism, unspecified: Secondary | ICD-10-CM

## 2019-06-01 DIAGNOSIS — I10 Essential (primary) hypertension: Secondary | ICD-10-CM

## 2019-06-01 LAB — LIPID PANEL
Cholesterol: 128 mg/dL (ref 0–200)
HDL: 42.6 mg/dL (ref >39.00)
LDL Cholesterol: 60 mg/dL (ref 0–99)
NonHDL: 85.59
Total CHOL/HDL Ratio: 3
Triglycerides: 128 mg/dL (ref 0.0–149.0)
VLDL: 25.6 mg/dL (ref 0.0–40.0)

## 2019-06-01 LAB — TSH: TSH: 3.76 u[IU]/mL (ref 0.35–4.50)

## 2019-06-01 MED ORDER — OXYCODONE-ACETAMINOPHEN 5-325 MG PO TABS: 1.0000 | ORAL_TABLET | Freq: Every day | ORAL | 0 refills | Status: DC | PRN

## 2019-06-01 NOTE — Progress Notes (Signed)
HPI:   Felicia Herrera is a 63 y.o. female, who is here today for pain and chronic disease management.  She was last seen on 02/12/2019. No new problems since the last visit.  Since her last visit she has follow-up with her pulmonologist due to dyspnea, problem seems to be stable. COPD, cough and wheezing , no more than usual.  Hypothyroidism: Dx'ed 20+ years ago.  Currently she is on levothyroxine 75 mcg daily. Tolerating medication well, no side effects reported. Negative for fever, chills,palpitations, changes in bowel habits, tremor, cold/heat intolerance, or abnormal weight changes.    Lab Results  Component Value Date   TSH 2.638 03/29/2018   Hyperlipidemia: Currently she is on atorvastatin 20 mg daily. She also follows a healthful, low-fat diet. She has tolerated medication well, no side effects reported today. + CVD in 2008, residual RUE paresis and contractures.   Lab Results  Component Value Date   CHOL 152 10/18/2017   HDL 65.20 10/18/2017   LDLCALC 67 10/18/2017   TRIG 99.0 10/18/2017   CHOLHDL 2 10/18/2017   Hx of HTN, she was on metoprolol ,discontinued because bradycardia. She is on non pharmacologic treatment now.  Lab Results  Component Value Date   CREATININE 0.75 04/19/2019   BUN 20 04/19/2019   NA 141 04/19/2019   K 3.8 04/19/2019   CL 105 04/19/2019   CO2 26 04/19/2019    Aortic atherosclerotic changes seen on imaging was done in the past. Negative for CP.  Chronic pain disorder: Hx of lung cancer, bilateral. S/P right lobectomy, residual right thoracic pain, around scar. Oncologist started Percocet 5-325 mg to take once daily, sent to her PCP after completing chemo for left lung adenocarcinoma.  Pain is constant , better during that day and worse at night,10/10,interferring with sleep. She takes percocet at bedtime and it helps her sleep better.  She states that problem is stable.  Review of Systems  Constitutional:  Negative for activity change, appetite change, fatigue and fever.  HENT: Negative for mouth sores, nosebleeds and sore throat.   Eyes: Negative for redness and visual disturbance.  Cardiovascular: Negative for leg swelling.  Gastrointestinal: Negative for abdominal pain, nausea and vomiting.       Negative for changes in bowel habits.  Genitourinary: Negative for decreased urine volume and hematuria.  Neurological: Negative for syncope and headaches.  Rest of ROS, see pertinent positives sand negatives in HPI  Current Outpatient Medications on File Prior to Visit  Medication Sig Dispense Refill  . albuterol (VENTOLIN HFA) 108 (90 Base) MCG/ACT inhaler Inhale 2 puffs into the lungs every 6 (six) hours as needed for wheezing or shortness of breath. 6.7 g 1  . aspirin EC 81 MG tablet Take 81 mg by mouth daily.    Marland Kitchen atorvastatin (LIPITOR) 40 MG tablet TAKE 1 TABLET BY MOUTH  DAILY 90 tablet 3  . betamethasone dipropionate (DIPROLENE) 0.05 % cream Apply topically daily as needed. 30 g 2  . BREO ELLIPTA 200-25 MCG/INH AEPB USE 1 INHALATION BY MOUTH  DAILY AT THE SAME TIME EACH DAY 180 each 3  . ciprofloxacin (CIPRO) 500 MG tablet Take 1 tablet (500 mg total) by mouth 2 (two) times daily. 20 tablet 0  . clotrimazole (LOTRIMIN) 1 % cream Apply 1 application topically 2 (two) times daily. (Patient taking differently: Apply 1 application topically 2 (two) times daily as needed. ) 30 g 2  . fluticasone (FLONASE) 50 MCG/ACT nasal spray Place  1 spray into both nostrils 2 (two) times daily. (Patient taking differently: Place 1 spray into both nostrils 2 (two) times daily as needed. ) 16 g 6  . guaiFENesin (MUCINEX) 600 MG 12 hr tablet Take 1 tablet (600 mg total) by mouth 2 (two) times daily. 60 tablet 2  . levothyroxine (SYNTHROID, LEVOTHROID) 75 MCG tablet TAKE 1 TABLET BY MOUTH  DAILY BEFORE BREAKFAST 90 tablet 3  . nystatin cream (MYCOSTATIN) Apply 1 application topically 2 (two) times daily. (Patient  taking differently: Apply 1 application topically 2 (two) times daily as needed. ) 30 g 0  . omeprazole (PRILOSEC) 20 MG capsule TAKE 1 CAPSULE BY MOUTH  DAILY 90 capsule 3  . prochlorperazine (COMPAZINE) 10 MG tablet Take 1 tablet (10 mg total) by mouth every 6 (six) hours as needed for nausea or vomiting. 30 tablet 0  . Respiratory Therapy Supplies (FLUTTER) DEVI Use as directed 1 each 0   Current Facility-Administered Medications on File Prior to Visit  Medication Dose Route Frequency Provider Last Rate Last Admin  . sodium chloride flush (NS) 0.9 % injection 10 mL  10 mL Intracatheter PRN Curt Bears, MD   10 mL at 06/22/17 1747     Past Medical History:  Diagnosis Date  . Adenocarcinoma of left lung, stage 2 ([place]) 12/27/2016  . Cancer [facility])    Lun cancer: Right Dx 2008, s/p lobectomy. Left Dx 09/2016  . COPD (chronic obstructive pulmonary disease) ([place])   . Encounter for antineoplastic chemotherapy 12/27/2016  . History of radiation therapy 01/24/17-03/08/17   left lung 2 Gy in 30 fractions  . Hyperlipidemia   . Hypertension   . Stroke [facility])    No Known Allergies  Social History   Socioeconomic History  . Marital status: Single    Spouse name: Not on file  . Number of children: Not on file  . Years of education: Not on file  . Highest education level: Not on file  Occupational History  . Occupation: Biomedical scientist  Tobacco Use  . Smoking status: Former Smoker    Packs/day: 1.00    Years: 30.00    Pack years: 30.00    Types: Cigarettes    Quit date: 06/21/2005    Years since quitting: 13.9  . Smokeless tobacco: Never Used  Substance and Sexual Activity  . Alcohol use: No  . Drug use: No  . Sexual activity: Not Currently  Other Topics Concern  . 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    Social Determinants of Health   Financial Resource Strain:   . Difficulty of Paying Living Expenses: Not on file  Food Insecurity:   . Worried About  Charity fundraiser in the Last Year: Not on file  . Ran Out of Food in the Last Year: Not on file  Transportation Needs:   . Lack of Transportation (Medical): Not on file  . Lack of Transportation (Non-Medical): Not on file  Physical Activity:   . Days of Exercise per Week: Not on file  . Minutes of Exercise per Session: Not on file  Stress:   . Feeling of Stress : Not on file  Social Connections:   . Frequency of Communication with Friends and Family: Not on file  . Frequency of Social Gatherings with Friends and Family: Not on file  . Attends Religious Services: Not on file  . Active Member of Clubs or Organizations: Not on file  .  Attends Archivist Meetings: Not on file  . Marital Status: Not on file    Vitals:   06/01/19 0700  BP: 128/80  Pulse: 89  Resp: 20  Temp: (!) 95.2 F (35.1 C)  SpO2: 93%   Body mass index is 27.89 kg/m.   Physical Exam  Nursing note and vitals reviewed. Constitutional: She is oriented to person, place, and time. She appears well-developed. No distress.  HENT:  Head: Normocephalic and atraumatic.  Mouth/Throat: Oropharynx is clear and moist and mucous membranes are normal.  Eyes: Pupils are equal, round, and reactive to light. Conjunctivae are normal.  Cardiovascular: Normal rate and regular rhythm.  No murmur heard. DP pulses present,bilateral.  Respiratory: Effort normal and breath sounds normal. No respiratory distress.  GI: Soft. She exhibits no mass. There is no hepatomegaly. There is no abdominal tenderness.  Musculoskeletal:        General: No edema.  Lymphadenopathy:    She has no cervical adenopathy.  Neurological: She is alert and oriented to person, place, and time. No cranial nerve deficit.  Stable gait, not assisted. RUE with weakness and fixed contractions. Rest with no focal deficit.  Skin: Skin is warm. No rash noted. No erythema.  Psychiatric: She has a normal mood and affect.  Well groomed, good eye  contact.   ASSESSMENT AND PLAN:   Felicia Herrera was seen today for pain and chronic disease management.  Orders Placed This Encounter  Procedures  . Varicella-zoster vaccine IM (Shingrix)  . Tdap vaccine greater than or equal to 7yo IM  . TSH  . Lipid panel    Lab Results  Component Value Date   CHOL 128 06/01/2019   HDL 42.60 06/01/2019   LDLCALC 60 06/01/2019   TRIG 128.0 06/01/2019   CHOLHDL 3 06/01/2019   Lab Results  Component Value Date   TSH 3.76 06/01/2019    1. Chronic pain disorder In general pain control is adequate. Tolerating medication well. We discussed some side effects of chronic opioid use. Rx x 3 sent. Med contract signed today. Will hold on urine tox,not sure about insurance coverage. [place] controlled substance report website reviewed.  - oxyCODONE-acetaminophen (PERCOCET/ROXICET) 5-325 MG tablet; Take 1 tablet by mouth daily as needed for severe pain.  Dispense: 30 tablet; Refill: 0.   2. Primary hypothyroidism No changes in current management, will follow labs done today and will give further recommendations accordingly.  3. Hyperlipidemia, unspecified hyperlipidemia type Continue Atorvastatin 40 mg daily and low fat diet. Further recommendations will be given according toFLP results.  4. Adenocarcinoma of left lung, stage 2 ([place]) Following with oncologist. - oxyCODONE-acetaminophen (PERCOCET/ROXICET) 5-325 MG tablet; Take 1 tablet by mouth daily as needed for severe pain.  Dispense: 30 tablet; Refill: 0  5. Atherosclerosis of aorta (HCC) Continue Atorvastatin 40 mg daily and aspirin.  6. Hypertension, essential, benign Well controlled on non pharmacologic treatment. Continue low salt diet.  7. Need for tetanus, diphtheria, and acellular pertussis (Tdap) vaccine - Tdap vaccine greater than or equal to 7yo IM  8. Need for shingles vaccine - Varicella-zoster vaccine IM (Shingrix)  Return in about 4 months (around  09/30/2019).    Deaja Rizo G. [name], MD  [facility]. [place] office.

## 2019-06-01 NOTE — Patient Instructions (Addendum)
A few things to remember from today's visit:   Need for shingles vaccine - Plan: Varicella-zoster vaccine IM (Shingrix)  Need for tetanus, diphtheria, and acellular pertussis (Tdap) vaccine - Plan: Tdap vaccine greater than or equal to 62yo IM  Chronic pain disorder - Plan: oxyCODONE-acetaminophen (PERCOCET/ROXICET) 5-325 MG tablet  Primary hypothyroidism - Plan: TSH  Hyperlipidemia, unspecified hyperlipidemia type - Plan: Lipid panel  Adenocarcinoma of left lung, stage 2 (HCC) - Plan: oxyCODONE-acetaminophen (PERCOCET/ROXICET) 5-325 MG tablet  All your medical problems seen to be stable, so no changes today. Today we order thyroid and cholesterol lab, I will let you know if medication needs to be adjusted. I will see you back in 4 months, before if needed.  Happy holidays!        \

## 2019-06-03 ENCOUNTER — Encounter: Payer: Self-pay | Admitting: Family Medicine

## 2019-06-03 MED ORDER — OXYCODONE-ACETAMINOPHEN 5-325 MG PO TABS: 1.0000 | ORAL_TABLET | Freq: Every day | ORAL | 0 refills | Status: DC | PRN

## 2019-06-18 ENCOUNTER — Ambulatory Visit: Payer: Medicare Other | Admitting: Family Medicine

## 2019-08-06 ENCOUNTER — Other Ambulatory Visit: Payer: Self-pay | Admitting: Family Medicine

## 2019-08-06 DIAGNOSIS — E039 Hypothyroidism, unspecified: Secondary | ICD-10-CM

## 2019-08-06 DIAGNOSIS — E785 Hyperlipidemia, unspecified: Secondary | ICD-10-CM

## 2019-08-06 DIAGNOSIS — K219 Gastro-esophageal reflux disease without esophagitis: Secondary | ICD-10-CM

## 2019-08-07 ENCOUNTER — Telehealth: Payer: Self-pay | Admitting: Family Medicine

## 2019-08-07 NOTE — Telephone Encounter (Signed)
Yes, at least 14 days. Thanks, BJ

## 2019-08-07 NOTE — Telephone Encounter (Signed)
Pt has an appointment on 08/13/19 for Hep C and #2 Shingles vaccines in the office. Pt would like to know how many days does she have to wait until she can get her COVID vaccine after her Hep C and #2 Shingles vaccines? Pt does not have an appointment for her COVID vaccine yet. Thanks

## 2019-08-07 NOTE — Telephone Encounter (Signed)
Isn't it 14 days?

## 2019-08-08 NOTE — Telephone Encounter (Signed)
I called and spoke with pt. We went over the information below & she verbalized understanding.

## 2019-08-10 ENCOUNTER — Other Ambulatory Visit: Payer: Self-pay | Admitting: *Deleted

## 2019-08-10 ENCOUNTER — Other Ambulatory Visit: Payer: Self-pay

## 2019-08-10 DIAGNOSIS — Z1159 Encounter for screening for other viral diseases: Secondary | ICD-10-CM

## 2019-08-13 ENCOUNTER — Ambulatory Visit (INDEPENDENT_AMBULATORY_CARE_PROVIDER_SITE_OTHER): Payer: Medicare Other

## 2019-08-13 ENCOUNTER — Other Ambulatory Visit: Payer: Self-pay

## 2019-08-13 DIAGNOSIS — Z1159 Encounter for screening for other viral diseases: Secondary | ICD-10-CM

## 2019-08-13 DIAGNOSIS — Z23 Encounter for immunization: Secondary | ICD-10-CM | POA: Diagnosis not present

## 2019-08-13 DIAGNOSIS — R6889 Other general symptoms and signs: Secondary | ICD-10-CM | POA: Diagnosis not present

## 2019-08-13 NOTE — Progress Notes (Signed)
Pt came into the office to receive her 2nd Shingles vaccine. Vaccine given in the left deltoid. Pt tolerated injection well & no signs/symptoms of a reaction prior to leaving the exam room.

## 2019-08-14 LAB — HEPATITIS C ANTIBODY
Hepatitis C Ab: NONREACTIVE
SIGNAL TO CUT-OFF: 0.41 (ref <1.00)

## 2019-08-30 ENCOUNTER — Other Ambulatory Visit: Payer: Self-pay | Admitting: Family Medicine

## 2019-08-30 DIAGNOSIS — C3492 Malignant neoplasm of unspecified part of left bronchus or lung: Secondary | ICD-10-CM

## 2019-08-30 DIAGNOSIS — G894 Chronic pain syndrome: Secondary | ICD-10-CM

## 2019-08-30 NOTE — Telephone Encounter (Signed)
Pt is requesting a refill for Oxycodone-Acetaminophen 5-325 mg tablet sent to [facility] on [facility]. Thanks

## 2019-08-31 MED ORDER — OXYCODONE-ACETAMINOPHEN 5-325 MG PO TABS: 1.0000 | ORAL_TABLET | Freq: Every day | ORAL | 0 refills | Status: DC | PRN

## 2019-08-31 NOTE — Telephone Encounter (Signed)
Last filled 08/01/19.

## 2019-08-31 NOTE — Telephone Encounter (Signed)
Pt called to check on medication. She would like it filled today if possible.

## 2019-09-17 ENCOUNTER — Telehealth: Payer: Self-pay | Admitting: Family Medicine

## 2019-09-17 NOTE — Telephone Encounter (Signed)
Patient wanted to know if Dr. [name] can increase her medication from 30 tablets to 40 tablets  oxyCODONE-acetaminophen (PERCOCET/ROXICET) 5-325 MG tablet  [address] [place], [place] Phone:  [phone]  Fax:  [phone]     Please advise

## 2019-09-20 NOTE — Telephone Encounter (Signed)
Pt called to check on her medication change request. She would like a follow up call.

## 2019-09-24 ENCOUNTER — Other Ambulatory Visit: Payer: Self-pay | Admitting: Family Medicine

## 2019-09-24 DIAGNOSIS — C3492 Malignant neoplasm of unspecified part of left bronchus or lung: Secondary | ICD-10-CM

## 2019-09-24 DIAGNOSIS — G894 Chronic pain syndrome: Secondary | ICD-10-CM

## 2019-09-24 MED ORDER — OXYCODONE-ACETAMINOPHEN 5-325 MG PO TABS: 1.0000 | ORAL_TABLET | Freq: Two times a day (BID) | ORAL | 0 refills | Status: DC | PRN

## 2019-09-24 NOTE — Telephone Encounter (Signed)
[facility] notified that rx has been sent to the pharmacy.

## 2019-09-24 NOTE — Telephone Encounter (Signed)
Rx sent. Thanks, BJ 

## 2019-10-22 ENCOUNTER — Ambulatory Visit (HOSPITAL_COMMUNITY)
Admission: RE | Admit: 2019-10-22 | Discharge: 2019-10-22 | Disposition: A | Discharge status: Home / Self Care | Payer: Medicare Other | Source: Ambulatory Visit | Attending: Internal Medicine | Admitting: Internal Medicine

## 2019-10-22 ENCOUNTER — Inpatient Hospital Stay: Payer: Medicare Other | Attending: Internal Medicine

## 2019-10-22 ENCOUNTER — Encounter (HOSPITAL_COMMUNITY): Payer: Self-pay

## 2019-10-22 ENCOUNTER — Other Ambulatory Visit: Payer: Self-pay

## 2019-10-22 DIAGNOSIS — E785 Hyperlipidemia, unspecified: Secondary | ICD-10-CM | POA: Diagnosis not present

## 2019-10-22 DIAGNOSIS — C3490 Malignant neoplasm of unspecified part of unspecified bronchus or lung: Secondary | ICD-10-CM | POA: Insufficient documentation

## 2019-10-22 DIAGNOSIS — Z7982 Long term (current) use of aspirin: Secondary | ICD-10-CM | POA: Insufficient documentation

## 2019-10-22 DIAGNOSIS — Z5111 Encounter for antineoplastic chemotherapy: Secondary | ICD-10-CM | POA: Diagnosis not present

## 2019-10-22 DIAGNOSIS — C3412 Malignant neoplasm of upper lobe, left bronchus or lung: Secondary | ICD-10-CM | POA: Diagnosis not present

## 2019-10-22 DIAGNOSIS — I10 Essential (primary) hypertension: Secondary | ICD-10-CM | POA: Diagnosis not present

## 2019-10-22 DIAGNOSIS — Z8673 Personal history of transient ischemic attack (TIA), and cerebral infarction without residual deficits: Secondary | ICD-10-CM | POA: Insufficient documentation

## 2019-10-22 DIAGNOSIS — J449 Chronic obstructive pulmonary disease, unspecified: Secondary | ICD-10-CM | POA: Diagnosis not present

## 2019-10-22 DIAGNOSIS — Z9221 Personal history of antineoplastic chemotherapy: Secondary | ICD-10-CM | POA: Diagnosis not present

## 2019-10-22 DIAGNOSIS — Z79899 Other long term (current) drug therapy: Secondary | ICD-10-CM | POA: Insufficient documentation

## 2019-10-22 DIAGNOSIS — Z923 Personal history of irradiation: Secondary | ICD-10-CM | POA: Insufficient documentation

## 2019-10-22 DIAGNOSIS — Z7951 Long term (current) use of inhaled steroids: Secondary | ICD-10-CM | POA: Diagnosis not present

## 2019-10-22 LAB — CMP (CANCER CENTER ONLY)
ALT: 11 U/L (ref 0–44)
AST: 12 U/L — ABNORMAL LOW (ref 15–41)
Albumin: 3.6 g/dL (ref 3.5–5.0)
Alkaline Phosphatase: 120 U/L (ref 38–126)
Anion gap: 15 (ref 5–15)
BUN: 12 mg/dL (ref 8–23)
CO2: 26 mmol/L (ref 22–32)
Calcium: 9.2 mg/dL (ref 8.9–10.3)
Chloride: 103 mmol/L (ref 98–111)
Creatinine: 0.96 mg/dL (ref 0.44–1.00)
GFR, Est AFR Am: >60 mL/min (ref >60)
GFR, Estimated: >60 mL/min (ref >60)
Glucose, Bld: 156 mg/dL — ABNORMAL HIGH (ref 70–99)
Potassium: 3.6 mmol/L (ref 3.5–5.1)
Sodium: 144 mmol/L (ref 135–145)
Total Bilirubin: 0.5 mg/dL (ref 0.3–1.2)
Total Protein: 7.8 g/dL (ref 6.5–8.1)

## 2019-10-22 LAB — CBC WITH DIFFERENTIAL (CANCER CENTER ONLY)
Abs Immature Granulocytes: 0.01 10*3/uL (ref 0.00–0.07)
Basophils Absolute: 0 10*3/uL (ref 0.0–0.1)
Basophils Relative: 0 %
Eosinophils Absolute: 0 10*3/uL (ref 0.0–0.5)
Eosinophils Relative: 1 %
HCT: 39.3 % (ref 36.0–46.0)
Hemoglobin: 11.8 g/dL — ABNORMAL LOW (ref 12.0–15.0)
Immature Granulocytes: 0 %
Lymphocytes Relative: 13 %
Lymphs Abs: 0.9 10*3/uL (ref 0.7–4.0)
MCH: 23 pg — ABNORMAL LOW (ref 26.0–34.0)
MCHC: 30 g/dL (ref 30.0–36.0)
MCV: 76.5 fL — ABNORMAL LOW (ref 80.0–100.0)
Monocytes Absolute: 0.5 10*3/uL (ref 0.1–1.0)
Monocytes Relative: 7 %
Neutro Abs: 5.3 10*3/uL (ref 1.7–7.7)
Neutrophils Relative %: 79 %
Platelet Count: 347 10*3/uL (ref 150–400)
RBC: 5.14 MIL/uL — ABNORMAL HIGH (ref 3.87–5.11)
RDW: 19.2 % — ABNORMAL HIGH (ref 11.5–15.5)
WBC Count: 6.8 10*3/uL (ref 4.0–10.5)
nRBC: 0 % (ref 0.0–0.2)

## 2019-10-22 MED ORDER — HEPARIN SOD (PORK) LOCK FLUSH 100 UNIT/ML IV SOLN: 500.0000 [IU] | Freq: Once | INTRAVENOUS | Status: DC

## 2019-10-22 MED ORDER — IOHEXOL 300 MG/ML  SOLN
75.0000 mL | Freq: Once | INTRAMUSCULAR | Status: AC | PRN
  Administered 2019-10-22: 75 mL via INTRAVENOUS

## 2019-10-22 MED ORDER — SODIUM CHLORIDE (PF) 0.9 % IJ SOLN
INTRAMUSCULAR | Status: AC
  Filled 2019-10-22: qty 50

## 2019-10-22 MED ORDER — HEPARIN SOD (PORK) LOCK FLUSH 100 UNIT/ML IV SOLN
INTRAVENOUS | Status: AC
  Filled 2019-10-22: qty 5

## 2019-10-24 ENCOUNTER — Other Ambulatory Visit: Payer: Self-pay

## 2019-10-24 ENCOUNTER — Encounter: Payer: Self-pay | Admitting: Internal Medicine

## 2019-10-24 ENCOUNTER — Inpatient Hospital Stay (HOSPITAL_BASED_OUTPATIENT_CLINIC_OR_DEPARTMENT_OTHER): Payer: Medicare Other | Admitting: Internal Medicine

## 2019-10-24 VITALS — BP 143/126 | HR 98 | Temp 98.2°F | Resp 20 | Ht 62.0 in | Wt 148.4 lb

## 2019-10-24 DIAGNOSIS — C3412 Malignant neoplasm of upper lobe, left bronchus or lung: Secondary | ICD-10-CM | POA: Diagnosis not present

## 2019-10-24 DIAGNOSIS — J449 Chronic obstructive pulmonary disease, unspecified: Secondary | ICD-10-CM | POA: Diagnosis not present

## 2019-10-24 DIAGNOSIS — C3492 Malignant neoplasm of unspecified part of left bronchus or lung: Secondary | ICD-10-CM

## 2019-10-24 DIAGNOSIS — Z79899 Other long term (current) drug therapy: Secondary | ICD-10-CM | POA: Diagnosis not present

## 2019-10-24 DIAGNOSIS — Z8673 Personal history of transient ischemic attack (TIA), and cerebral infarction without residual deficits: Secondary | ICD-10-CM | POA: Diagnosis not present

## 2019-10-24 DIAGNOSIS — E785 Hyperlipidemia, unspecified: Secondary | ICD-10-CM | POA: Diagnosis not present

## 2019-10-24 DIAGNOSIS — C3490 Malignant neoplasm of unspecified part of unspecified bronchus or lung: Secondary | ICD-10-CM

## 2019-10-24 DIAGNOSIS — Z9221 Personal history of antineoplastic chemotherapy: Secondary | ICD-10-CM | POA: Diagnosis not present

## 2019-10-24 DIAGNOSIS — I10 Essential (primary) hypertension: Secondary | ICD-10-CM | POA: Diagnosis not present

## 2019-10-24 DIAGNOSIS — Z7951 Long term (current) use of inhaled steroids: Secondary | ICD-10-CM | POA: Diagnosis not present

## 2019-10-24 DIAGNOSIS — Z923 Personal history of irradiation: Secondary | ICD-10-CM | POA: Diagnosis not present

## 2019-10-24 DIAGNOSIS — Z7982 Long term (current) use of aspirin: Secondary | ICD-10-CM | POA: Diagnosis not present

## 2019-10-24 NOTE — Progress Notes (Signed)
.mo      [place] [phone]) [phone]   [phone]  OFFICE PROGRESS NOTE  [name], [name], MD [place] [address]  DIAGNOSIS: Stage IIB (T2b, N1, M0) non-small cell lung cancer, adenocarcinoma presented with large left upper lobe lung mass in addition to left hilar lymphadenopathy diagnosed in April 2018. Her previous workup was done at [facility] in [facility]. There was insufficient material to perform the molecular studies.  [facility] Molecular studies: BRCA2 N339f. Negative for EGFR, ALK, ROS1, BRAF mutations.  PRIOR THERAPY:  1) A course of concurrent chemoradiation with weekly carboplatin for AUC of 2 and paclitaxel 45 MG/M2. First dose 01/24/2017. Status post 6 cycles. 2) Consolidation immunotherapy with Imfinzi (Durvalumab) 10 mg/KG every 2 weeks status post 26 cycles.   CURRENT THERAPY: Observation.  INTERVAL HISTORY: Felicia Arrazola89y.o. female returns to the clinic today for 6 months follow-up visit.  The patient is feeling fine except for fatigue and shortness of breath with exertion as well as mild cough.  She is currently using cough medication as well as Mucinex.  She is followed by Dr. RChase Callerfor her COPD.  She denied having any current fever or chills.  She has no nausea, vomiting, diarrhea or constipation.  She has some change in her voice earlier today.  The patient is here today for evaluation and repeat CT scan of the chest for restaging of her disease.  MEDICAL HISTORY: Past Medical History:  Diagnosis Date  . [place] 2 [place]  . Cancer ([facility]    Lun cancer: Right Dx 2008, s/p lobectomy. Left Dx 09/2016  . COPD (chronic obstructive pulmonary disease) ([place]   . Encounter for antineoplastic chemotherapy 12/27/2016  . History of radiation therapy 01/24/17-03/08/17   left lung 2 Gy in 30 fractions  . Hyperlipidemia   . Hypertension   . Stroke  ([facility]     ALLERGIES:  has No Known Allergies.  MEDICATIONS:  Current Outpatient Medications  Medication Sig Dispense Refill  . albuterol (VENTOLIN HFA) 108 (90 Base) MCG/ACT inhaler Inhale 2 puffs into the lungs every 6 (six) hours as needed for wheezing or shortness of breath. 6.7 Herrera 1  . aspirin EC 81 MG tablet Take 81 mg by mouth daily.    .Marland Kitchenatorvastatin (LIPITOR) 40 MG tablet TAKE 1 TABLET BY MOUTH  DAILY 90 tablet 3  . betamethasone dipropionate (DIPROLENE) 0.05 % cream Apply topically daily as needed. 30 Herrera 2  . BREO ELLIPTA 200-25 MCG/INH AEPB USE 1 INHALATION BY MOUTH  DAILY AT THE SAME TIME EACH DAY 180 each 3  . ciprofloxacin (CIPRO) 500 MG tablet Take 1 tablet (500 mg total) by mouth 2 (two) times daily. 20 tablet 0  . clotrimazole (LOTRIMIN) 1 % cream Apply 1 application topically 2 (two) times daily. (Patient taking differently: Apply 1 application topically 2 (two) times daily as needed. ) 30 Herrera 2  . fluticasone (FLONASE) 50 MCG/ACT nasal spray Place 1 spray into both nostrils 2 (two) times daily. (Patient taking differently: Place 1 spray into both nostrils 2 (two) times daily as needed. ) 16 Herrera 6  . guaiFENesin (MUCINEX) 600 MG 12 hr tablet Take 1 tablet (600 mg total) by mouth 2 (two) times daily. 60 tablet 2  . levothyroxine (SYNTHROID) 75 MCG tablet TAKE 1 TABLET BY MOUTH  DAILY BEFORE BREAKFAST 90 tablet 3  . nystatin cream (MYCOSTATIN) Apply 1 application topically  2 (two) times daily. (Patient taking differently: Apply 1 application topically 2 (two) times daily as needed. ) 30 Herrera 0  . omeprazole (PRILOSEC) 20 MG capsule TAKE 1 CAPSULE BY MOUTH  DAILY 90 capsule 3  . oxyCODONE-acetaminophen (PERCOCET/ROXICET) 5-325 MG tablet Take 1 tablet by mouth 2 (two) times daily as needed for severe pain. 40 tablet 0  . prochlorperazine (COMPAZINE) 10 MG tablet Take 1 tablet (10 mg total) by mouth every 6 (six) hours as needed for nausea or vomiting. 30 tablet 0  . Respiratory Therapy  Supplies (FLUTTER) DEVI Use as directed 1 each 0   No current facility-administered medications for this visit.   Facility-Administered Medications Ordered in Other Visits  Medication Dose Route Frequency Provider Last Rate Last Admin  . sodium chloride flush (NS) 0.9 % injection 10 mL  10 mL Intracatheter PRN Curt Bears, MD   10 mL at 06/22/17 1747    SURGICAL HISTORY:  Past Surgical History:  Procedure Laterality Date  . BREAST BIOPSY     several. Denies Hx of breast cancer.  . IR FLUORO GUIDE PORT INSERTION RIGHT  02/04/2017  . IR US GUIDE VASC ACCESS RIGHT  02/04/2017  . THORACOTOMY/LOBECTOMY Right 2008  . TONSILLECTOMY  1960    REVIEW OF SYSTEMS:  A comprehensive review of systems was negative except for: Constitutional: positive for fatigue Respiratory: positive for cough and dyspnea on exertion Gastrointestinal: positive for nausea   PHYSICAL EXAMINATION: General appearance: alert, cooperative, fatigued and no distress Head: Normocephalic, without obvious abnormality, atraumatic Neck: no adenopathy, no JVD, supple, symmetrical, trachea midline and thyroid not enlarged, symmetric, no tenderness/mass/nodules Lymph nodes: Cervical, supraclavicular, and axillary nodes normal. Resp: diminished breath sounds RLL and dullness to percussion RLL Back: symmetric, no curvature. ROM normal. No CVA tenderness. Cardio: regular rate and rhythm, S1, S2 normal, no murmur, click, rub or gallop GI: soft, non-tender; bowel sounds normal; no masses,  no organomegaly Extremities: extremities normal, atraumatic, no cyanosis or edema  ECOG PERFORMANCE STATUS: 1 - Symptomatic but completely ambulatory  Blood pressure (!) 143/126, pulse 98, temperature 98.2 F (36.8 C), temperature source Oral, resp. rate 20, height '5\' 2"'$  (1.575 m), weight 148 lb 6.4 oz (67.3 kg), SpO2 95 %.  LABORATORY DATA: Lab Results  Component Value Date   WBC 6.8 10/22/2019   HGB 11.8 (L) 10/22/2019   HCT 39.3  10/22/2019   MCV 76.5 (L) 10/22/2019   PLT 347 10/22/2019      Chemistry      Component Value Date/Time   NA 144 10/22/2019 0943   NA 142 06/22/2017 1335   K 3.6 10/22/2019 0943   K 3.9 06/22/2017 1335   CL 103 10/22/2019 0943   CO2 26 10/22/2019 0943   CO2 26 06/22/2017 1335   BUN 12 10/22/2019 0943   BUN 13.6 06/22/2017 1335   CREATININE 0.96 10/22/2019 0943   CREATININE 0.7 06/22/2017 1335      Component Value Date/Time   CALCIUM 9.2 10/22/2019 0943   CALCIUM 9.4 06/22/2017 1335   ALKPHOS 120 10/22/2019 0943   ALKPHOS 93 06/22/2017 1335   AST 12 (L) 10/22/2019 0943   AST 12 06/22/2017 1335   ALT 11 10/22/2019 0943   ALT 15 06/22/2017 1335   BILITOT 0.5 10/22/2019 0943   BILITOT 0.25 06/22/2017 1335       RADIOGRAPHIC STUDIES: CT Chest W Contrast  Result Date: 10/22/2019 CLINICAL DATA:  Non-small cell lung cancer staging, follow-up XRT, chemo, immunotherapy EXAM: CT CHEST  WITH CONTRAST TECHNIQUE: Multidetector CT imaging of the chest was performed during intravenous contrast administration. CONTRAST:  24m OMNIPAQUE IOHEXOL 300 MG/ML  SOLN COMPARISON:  04/19/2019 FINDINGS: Cardiovascular: Aortic atherosclerosis. Right chest port catheter. Normal heart size. No pericardial effusion. Mediastinum/Nodes: No enlarged mediastinal, hilar, or axillary lymph nodes. Thyroid gland, trachea, and esophagus demonstrate no significant findings. Lungs/Pleura: Status post right pneumonectomy. Unchanged lower tracheal stent. Mild centrilobular emphysema. Unchanged bandlike perihilar fibrosis and consolidation of the left lung (series 7, image 58). Numerous small irregular opacities of left lower lobe, generally similar to prior examination although slightly increased. There is a new, conspicuous irregular opacity of the left lower lobe measuring approximately 1.3 cm in total (series 7, image 98). Upper Abdomen: No acute abnormality. Musculoskeletal: No chest wall mass or suspicious bone lesions  identified. IMPRESSION: 1. Redemonstrated post treatment appearance of the perihilar left lung. 2.  Status post right pneumonectomy. 3. Numerous small irregular opacities of left lower lobe, generally similar to prior examination although slightly increased. There is a new, conspicuous irregular opacity of the left lower lobe measuring approximately 1.3 cm in total. These findings are likely infectious or inflammatory, metastatic disease, less favored, although warrant attention on follow-up. 4.  Emphysema (ICD10-J43.9). 5.  Aortic Atherosclerosis (ICD10-I70.0). Electronically Signed   By: AEddie CandleM.D.   On: 10/22/2019 12:54    ASSESSMENT AND PLAN: This is a very pleasant 64years old white female recently diagnosed with unresectable a stage IIB non-small cell lung cancer, adenocarcinoma. The patient underwent a course of concurrent chemoradiation with weekly carboplatin and paclitaxel status post 6 cycles.  She tolerated the treatment well and had partial response. She completed a course of consolidation treatment with immunotherapy with Imfinzi (Durvalumab) status post 26 cycles.  She tolerated her treatment well with no concerning adverse effects. The patient has been on observation since that time and she is feeling fine except for the persistent cough and shortness of breath with exertion. She had repeat CT scan of the chest performed recently.  I personally and independently reviewed the scan images and discussed the results with the patient today. HIDA scan showed no significant evidence for disease progression.  She has new opacity in the left lung suspicious for inflammatory process that need close monitoring. I recommended for the patient to continue on observation with repeat CT scan of the chest in 6 months. For the COPD and the inflammatory process, I advised her to follow-up with Dr. RChase Callerfor close monitoring of her condition. The patient was advised to call immediately if she has  any concerning symptoms in the interval. The patient voices understanding of current disease status and treatment options and is in agreement with the current care plan. All questions were answered. The patient knows to call the clinic with any problems, questions or concerns. We can certainly see the patient much sooner if necessary.  Disclaimer: This note was dictated with voice recognition software. Similar sounding words can inadvertently be transcribed and may not be corrected upon review.

## 2019-10-26 ENCOUNTER — Telehealth: Payer: Self-pay | Admitting: Internal Medicine

## 2019-10-26 NOTE — Telephone Encounter (Signed)
Scheduled per los. Called and left msg. Mailed printout  °

## 2019-10-31 ENCOUNTER — Ambulatory Visit: Payer: Medicare Other | Admitting: Family Medicine

## 2019-11-02 ENCOUNTER — Ambulatory Visit (INDEPENDENT_AMBULATORY_CARE_PROVIDER_SITE_OTHER): Payer: Medicare Other | Admitting: Family Medicine

## 2019-11-02 ENCOUNTER — Other Ambulatory Visit: Payer: Self-pay

## 2019-11-02 ENCOUNTER — Encounter: Payer: Self-pay | Admitting: Family Medicine

## 2019-11-02 VITALS — BP 120/88 | HR 88 | Temp 98.1°F | Resp 16 | Ht 62.0 in | Wt 146.0 lb

## 2019-11-02 DIAGNOSIS — R0600 Dyspnea, unspecified: Secondary | ICD-10-CM | POA: Diagnosis not present

## 2019-11-02 DIAGNOSIS — I10 Essential (primary) hypertension: Secondary | ICD-10-CM

## 2019-11-02 DIAGNOSIS — J449 Chronic obstructive pulmonary disease, unspecified: Secondary | ICD-10-CM | POA: Diagnosis not present

## 2019-11-02 DIAGNOSIS — C3492 Malignant neoplasm of unspecified part of left bronchus or lung: Secondary | ICD-10-CM

## 2019-11-02 DIAGNOSIS — G894 Chronic pain syndrome: Secondary | ICD-10-CM | POA: Diagnosis not present

## 2019-11-02 DIAGNOSIS — I700 Atherosclerosis of aorta: Secondary | ICD-10-CM

## 2019-11-02 DIAGNOSIS — R6889 Other general symptoms and signs: Secondary | ICD-10-CM | POA: Diagnosis not present

## 2019-11-02 MED ORDER — OXYCODONE-ACETAMINOPHEN 5-325 MG PO TABS: 0.5000 | ORAL_TABLET | Freq: Three times a day (TID) | ORAL | 0 refills | Status: DC | PRN

## 2019-11-02 MED ORDER — AMLODIPINE BESYLATE 5 MG PO TABS: 5.0000 mg | ORAL_TABLET | Freq: Every day | ORAL | 0 refills | Status: DC

## 2019-11-02 NOTE — Assessment & Plan Note (Signed)
New med contract signed today. [place] controlled substance report website reviewed. Increase number of Percocet per month from 40-45 to continue one half every 8 hours as needed. We had discussed side effects.

## 2019-11-02 NOTE — Patient Instructions (Signed)
A few things to remember from today's visit: Today we are adding amlodipine, 5 mg sent to your pharmacy, you going to start with 1/2 tablet at bedtime and increase it to the whole tablet in 2 to 3 weeks if blood pressure is stated above 140/90.  DASH Eating Plan DASH stands for "Dietary Approaches to Stop Hypertension." The DASH eating plan is a healthy eating plan that has been shown to reduce high blood pressure (hypertension). It may also reduce your risk for type 2 diabetes, heart disease, and stroke. The DASH eating plan may also help with weight loss. What are tips for following this plan?  General guidelines  Avoid eating more than 2,300 mg (milligrams) of salt (sodium) a day. If you have hypertension, you may need to reduce your sodium intake to 1,500 mg a day.  Limit alcohol intake to no more than 1 drink a day for nonpregnant women and 2 drinks a day for men. One drink equals 12 oz of beer, 5 oz of wine, or 1 oz of hard liquor.  Work with your health care provider to maintain a healthy body weight or to lose weight. Ask what an ideal weight is for you.  Get at least 30 minutes of exercise that causes your heart to beat faster (aerobic exercise) most days of the week. Activities may include walking, swimming, or biking.  Work with your health care provider or diet and nutrition specialist (dietitian) to adjust your eating plan to your individual calorie needs. Reading food labels   Check food labels for the amount of sodium per serving. Choose foods with less than 5 percent of the Daily Value of sodium. Generally, foods with less than 300 mg of sodium per serving fit into this eating plan.  To find whole grains, look for the word "whole" as the first word in the ingredient list. Shopping  Buy products labeled as "low-sodium" or "no salt added."  Buy fresh foods. Avoid canned foods and premade or frozen meals. Cooking  Avoid adding salt when cooking. Use salt-free seasonings  or herbs instead of table salt or sea salt. Check with your health care provider or pharmacist before using salt substitutes.  Do not fry foods. Cook foods using healthy methods such as baking, boiling, grilling, and broiling instead.  Cook with heart-healthy oils, such as olive, canola, soybean, or sunflower oil. Meal planning  Eat a balanced diet that includes: ? 5 or more servings of fruits and vegetables each day. At each meal, try to fill half of your plate with fruits and vegetables. ? Up to 6-8 servings of whole grains each day. ? Less than 6 oz of lean meat, poultry, or fish each day. A 3-oz serving of meat is about the same size as a deck of cards. One egg equals 1 oz. ? 2 servings of low-fat dairy each day. ? A serving of nuts, seeds, or beans 5 times each week. ? Heart-healthy fats. Healthy fats called Omega-3 fatty acids are found in foods such as flaxseeds and coldwater fish, like sardines, salmon, and mackerel.  Limit how much you eat of the following: ? Canned or prepackaged foods. ? Food that is high in trans fat, such as fried foods. ? Food that is high in saturated fat, such as fatty meat. ? Sweets, desserts, sugary drinks, and other foods with added sugar. ? Full-fat dairy products.  Do not salt foods before eating.  Try to eat at least 2 vegetarian meals each week.  Eat  more home-cooked food and less restaurant, buffet, and fast food.  When eating at a restaurant, ask that your food be prepared with less salt or no salt, if possible. What foods are recommended? The items listed may not be a complete list. Talk with your dietitian about what dietary choices are best for you. Grains Whole-grain or whole-wheat bread. Whole-grain or whole-wheat pasta. Brown rice. [place]. Bulgur. Whole-grain and low-sodium cereals. Pita bread. Low-fat, low-sodium crackers. Whole-wheat flour tortillas. Vegetables Fresh or frozen vegetables (raw, steamed, roasted, or grilled).  Low-sodium or reduced-sodium tomato and vegetable juice. Low-sodium or reduced-sodium tomato sauce and tomato paste. Low-sodium or reduced-sodium canned vegetables. Fruits All fresh, dried, or frozen fruit. Canned fruit in natural juice (without added sugar). Meat and other protein foods Skinless chicken or [place]. Ground chicken or [place]. Pork with fat trimmed off. Fish and seafood. Egg whites. Dried beans, peas, or lentils. Unsalted nuts, nut butters, and seeds. Unsalted canned beans. Lean cuts of beef with fat trimmed off. Low-sodium, lean deli meat. Dairy Low-fat (1%) or fat-free (skim) milk. Fat-free, low-fat, or reduced-fat cheeses. Nonfat, low-sodium ricotta or cottage cheese. Low-fat or nonfat yogurt. Low-fat, low-sodium cheese. Fats and oils Soft margarine without trans fats. Vegetable oil. Low-fat, reduced-fat, or light mayonnaise and salad dressings (reduced-sodium). Canola, safflower, olive, soybean, and sunflower oils. Avocado. Seasoning and other foods Herbs. Spices. Seasoning mixes without salt. Unsalted popcorn and pretzels. Fat-free sweets. What foods are not recommended? The items listed may not be a complete list. Talk with your dietitian about what dietary choices are best for you. Grains Baked goods made with fat, such as croissants, muffins, or some breads. Dry pasta or rice meal packs. Vegetables Creamed or fried vegetables. Vegetables in a cheese sauce. Regular canned vegetables (not low-sodium or reduced-sodium). Regular canned tomato sauce and paste (not low-sodium or reduced-sodium). Regular tomato and vegetable juice (not low-sodium or reduced-sodium). Angie Fava. Olives. Fruits Canned fruit in a light or heavy syrup. Fried fruit. Fruit in cream or butter sauce. Meat and other protein foods Fatty cuts of meat. Ribs. Fried meat. Berniece Salines. Sausage. Bologna and other processed lunch meats. Salami. Fatback. Hotdogs. Bratwurst. Salted nuts and seeds. Canned beans with added  salt. Canned or smoked fish. Whole eggs or egg yolks. Chicken or [place] with skin. Dairy Whole or 2% milk, cream, and half-and-half. Whole or full-fat cream cheese. Whole-fat or sweetened yogurt. Full-fat cheese. Nondairy creamers. Whipped toppings. Processed cheese and cheese spreads. Fats and oils Butter. Stick margarine. Lard. Shortening. Ghee. Bacon fat. Tropical oils, such as coconut, palm kernel, or palm oil. Seasoning and other foods Salted popcorn and pretzels. Onion salt, garlic salt, seasoned salt, table salt, and sea salt. Worcestershire sauce. Tartar sauce. Barbecue sauce. Teriyaki sauce. Soy sauce, including reduced-sodium. Steak sauce. Canned and packaged gravies. Fish sauce. Oyster sauce. Cocktail sauce. Horseradish that you find on the shelf. Ketchup. Mustard. Meat flavorings and tenderizers. Bouillon cubes. Hot sauce and Tabasco sauce. Premade or packaged marinades. Premade or packaged taco seasonings. Relishes. Regular salad dressings. Where to find more information:  National Heart, Lung, and [place]: https://wilson-eaton.com/  American Heart Association: www.heart.org Summary  The DASH eating plan is a healthy eating plan that has been shown to reduce high blood pressure (hypertension). It may also reduce your risk for type 2 diabetes, heart disease, and stroke.  With the DASH eating plan, you should limit salt (sodium) intake to 2,300 mg a day. If you have hypertension, you may need to reduce your sodium intake to  1,500 mg a day.  When on the DASH eating plan, aim to eat more fresh fruits and vegetables, whole grains, lean proteins, low-fat dairy, and heart-healthy fats.  Work with your health care provider or diet and nutrition specialist (dietitian) to adjust your eating plan to your individual calorie needs. This information is not intended to replace advice given to you by your health care provider. Make sure you discuss any questions you have with your health care  provider. Document Revised: 05/20/2017 Document Reviewed: 05/31/2016 Elsevier Patient Education  [employer].   If you need refills please call your pharmacy. Do not use My Chart to request refills or for acute issues that need immediate attention.    Please be sure medication list is accurate. If a new problem present, please set up appointment sooner than planned today.

## 2019-11-02 NOTE — Progress Notes (Signed)
Chief Complaint  Patient presents with  . Hypertension    Elevated BP  . chronic pain management   HPI: Felicia Herrera is a 64 y.o. female, who is here today concerned about elevated BP.  She has history of hypertension, currently she is on nonpharmacologic treatment. In the past she has been on metoprolol, which was discontinued due to bradycardia.  BP mildly elevated during recent oncologist visit. 143/126 and 154/79. She is not checking BP at home.  Negative for severe/frequent headache, visual changes, chest pain, palpitation, claudication, focal weakness, or edema.  Lab Results  Component Value Date   CREATININE 0.96 10/22/2019   BUN 12 10/22/2019   NA 144 10/22/2019   K 3.6 10/22/2019   CL 103 10/22/2019   CO2 26 10/22/2019   Negative for gross hematuria, decreased urine output, or foam in urine.  Aortic atherosclerosis noted on imaging. + hx of CVA She is on Aspirin 81 mg daily and Atorvastatin 40 mg daily.  Chronic pain:Severe pain right costal in incisional scar, s/p right lobectomy. Pain is constant. It seems to be getting worse. Recently requested increasing # of tab for Percocet per month. She is on Percocet 5-325 mg , she takes 1/2 tan at the time, tid. This regiment seems to be effective for pain controlled. If she doe snot take med ,pain is 10/10. With medication 4-6/10.  COPD: She has had a "little more" SOB for the past month. Associated wheezing and productive cough, these are stable. She follows with pulmonologist, cancelled recent appt, planning on re-scheduling.  She is on Breo Ellipta 200-25 mcg daily. Albuterol inh as needed, she has not used it in a while. Negative for fever,chills,changes in appetite,or sore throat.  Review of Systems  Constitutional: Positive for fatigue. Negative for activity change.  HENT: Negative for mouth sores and nosebleeds.   Eyes: Negative for redness and visual disturbance.  Respiratory: Negative  for chest tightness and stridor.   Gastrointestinal: Negative for abdominal pain, nausea and vomiting.       Negative for changes in bowel habits.  Musculoskeletal: Negative for myalgias.  Skin: Negative for rash.  Allergic/Immunologic: Positive for environmental allergies.  Neurological: Negative for syncope and facial asymmetry.  Psychiatric/Behavioral: Negative for confusion. The patient is nervous/anxious.   Rest see pertinent positives and negatives per HPI.   Current Outpatient Medications on File Prior to Visit  Medication Sig Dispense Refill  . albuterol (VENTOLIN HFA) 108 (90 Base) MCG/ACT inhaler Inhale 2 puffs into the lungs every 6 (six) hours as needed for wheezing or shortness of breath. 6.7 g 1  . aspirin EC 81 MG tablet Take 81 mg by mouth daily.    Marland Kitchen atorvastatin (LIPITOR) 40 MG tablet TAKE 1 TABLET BY MOUTH  DAILY 90 tablet 3  . betamethasone dipropionate (DIPROLENE) 0.05 % cream Apply topically daily as needed. 30 g 2  . BREO ELLIPTA 200-25 MCG/INH AEPB USE 1 INHALATION BY MOUTH  DAILY AT THE SAME TIME EACH DAY 180 each 3  . clotrimazole (LOTRIMIN) 1 % cream Apply 1 application topically 2 (two) times daily. (Patient taking differently: Apply 1 application topically 2 (two) times daily as needed. ) 30 g 2  . fluticasone (FLONASE) 50 MCG/ACT nasal spray Place 1 spray into both nostrils 2 (two) times daily. (Patient taking differently: Place 1 spray into both nostrils 2 (two) times daily as needed. ) 16 g 6  . levothyroxine (SYNTHROID) 75 MCG tablet TAKE 1 TABLET BY MOUTH  DAILY BEFORE BREAKFAST 90 tablet 3  . nystatin cream (MYCOSTATIN) Apply 1 application topically 2 (two) times daily. (Patient taking differently: Apply 1 application topically 2 (two) times daily as needed. ) 30 g 0  . omeprazole (PRILOSEC) 20 MG capsule TAKE 1 CAPSULE BY MOUTH  DAILY 90 capsule 3  . prochlorperazine (COMPAZINE) 10 MG tablet Take 1 tablet (10 mg total) by mouth every 6 (six) hours as needed  for nausea or vomiting. 30 tablet 0  . Respiratory Therapy Supplies (FLUTTER) DEVI Use as directed 1 each 0  . guaiFENesin (MUCINEX) 600 MG 12 hr tablet Take 1 tablet (600 mg total) by mouth 2 (two) times daily. (Patient not taking: Reported on 11/02/2019) 60 tablet 2   Current Facility-Administered Medications on File Prior to Visit  Medication Dose Route Frequency Provider Last Rate Last Admin  . sodium chloride flush (NS) 0.9 % injection 10 mL  10 mL Intracatheter PRN Curt Bears, MD   10 mL at 06/22/17 1747     Past Medical History:  Diagnosis Date  . Adenocarcinoma of left lung, stage 2 ([place]) 12/27/2016  . Cancer [facility])    Lun cancer: Right Dx 2008, s/p lobectomy. Left Dx 09/2016  . COPD (chronic obstructive pulmonary disease) ([place])   . Encounter for antineoplastic chemotherapy 12/27/2016  . History of radiation therapy 01/24/17-03/08/17   left lung 2 Gy in 30 fractions  . Hyperlipidemia   . Hypertension   . Stroke [facility])    No Known Allergies  Social History   Socioeconomic History  . Marital status: Single    Spouse name: Not on file  . Number of children: Not on file  . Years of education: Not on file  . Highest education level: Not on file  Occupational History  . Occupation: Biomedical scientist  Tobacco Use  . Smoking status: Former Smoker    Packs/day: 1.00    Years: 30.00    Pack years: 30.00    Types: Cigarettes    Quit date: 06/21/2005    Years since quitting: 14.3  . Smokeless tobacco: Never Used  Substance and Sexual Activity  . Alcohol use: No  . Drug use: No  . Sexual activity: Not Currently  Other Topics Concern  . 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    Social Determinants of Health   Financial Resource Strain:   . Difficulty of Paying Living Expenses:   Food Insecurity:   . Worried About Charity fundraiser in the Last Year:   . Arboriculturist in the Last Year:   Transportation Needs:   . Film/video editor  (Medical):   Marland Kitchen Lack of Transportation (Non-Medical):   Physical Activity:   . Days of Exercise per Week:   . Minutes of Exercise per Session:   Stress:   . Feeling of Stress :   Social Connections:   . Frequency of Communication with Friends and Family:   . Frequency of Social Gatherings with Friends and Family:   . Attends Religious Services:   . Active Member of Clubs or Organizations:   . Attends Archivist Meetings:   Marland Kitchen Marital Status:     Vitals:   11/02/19 1232  BP: 120/88  Pulse: 88  Resp: 16  Temp: 98.1 F (36.7 C)  SpO2: 96%   Body mass index is 26.7 kg/m.  Physical Exam  Nursing note and vitals reviewed. Constitutional: She is oriented to person,  place, and time. She appears well-developed. No distress.  HENT:  Head: Normocephalic and atraumatic.  Mouth/Throat: Oropharynx is clear and moist and mucous membranes are normal.  Eyes: Pupils are equal, round, and reactive to light. Conjunctivae are normal.  Cardiovascular: Normal rate and regular rhythm.  No murmur heard. Pulses:      Dorsalis pedis pulses are 2+ on the right side and 2+ on the left side.  Respiratory: Effort normal and breath sounds normal. No respiratory distress.  GI: Soft. She exhibits no mass. There is no hepatomegaly. There is no abdominal tenderness.  Musculoskeletal:        General: No edema.  Lymphadenopathy:    She has no cervical adenopathy.  Neurological: She is alert and oriented to person, place, and time. She has normal strength. No cranial nerve deficit.  Stable gait, not assisted. RUE with fixed contractions and mild weakness.  Skin: Skin is warm. No rash noted. No erythema.  Psychiatric: Her mood appears anxious.  Well groomed, good eye contact.   ASSESSMENT AND PLAN:  Ms.Keyondra was seen today for hypertension and chronic pain management.  Diagnoses and all orders for this visit:  Dyspnea, unspecified type Chronic. Lung auscultation negative for wheezing and  not in respiratory distress. Instructed about warning signs. I do not think imaging is needed today.  Chronic pain disorder New med contract signed today. [place] controlled substance report website reviewed. Increase number of Percocet per month from 40-45 to continue one half every 8 hours as needed. We had discussed side effects.   Atherosclerosis of aorta (HCC) Continue Aspirin 81 mg daily and atorvastatin 40 mg daily.  Hypertension, essential, benign Problem is now well controlled. She agrees with trying amlodipine 2.5 mg at bedtime, if BP is still elevated in 2 to 3 weeks, she was instructed to increase dose of amlodipine to 5 mg. Low salt diet recommended. Instructed to monitor BP regularly. Follow-up in 2 months, before if needed.  Adenocarcinoma of left lung, stage 2 [facility]) Following with oncologist. Pain is adequately controlled with Percocet.   -     oxyCODONE-acetaminophen (PERCOCET/ROXICET) 5-325 MG tablet; Take 0.5 tablets by mouth every 8 (eight) hours as needed for severe pain.  Chronic obstructive pulmonary disease, unspecified COPD type ([place]) She is not interested in Prednisone course. Albuterol inh 2 puff every 6 hours for a week then as needed for wheezing or shortness of breath.  No changes in [place]. She is going to re-schedule appt with pulmonologist. Instructed about warning signs.  Return in about 2 months (around 01/02/2020).   Abbegale Stehle G. [name], MD atorvastatin  [facility]. [place] office.  Discharge Instructions   None     A few things to remember from today's visit: Today we are adding amlodipine, 5 mg sent to your pharmacy, you going to start with 1/2 tablet at bedtime and increase it to the whole tablet in 2 to 3 weeks if blood pressure is stated above 140/90.  DASH Eating Plan DASH stands for "Dietary Approaches to Stop Hypertension." The DASH eating plan is a healthy eating plan that has been shown to reduce high blood pressure  (hypertension). It may also reduce your risk for type 2 diabetes, heart disease, and stroke. The DASH eating plan may also help with weight loss. What are tips for following this plan?  General guidelines  Avoid eating more than 2,300 mg (milligrams) of salt (sodium) a day. If you have hypertension, you may need to reduce your sodium intake to  1,500 mg a day.  Limit alcohol intake to no more than 1 drink a day for nonpregnant women and 2 drinks a day for men. One drink equals 12 oz of beer, 5 oz of wine, or 1 oz of hard liquor.  Work with your health care provider to maintain a healthy body weight or to lose weight. Ask what an ideal weight is for you.  Get at least 30 minutes of exercise that causes your heart to beat faster (aerobic exercise) most days of the week. Activities may include walking, swimming, or biking.  Work with your health care provider or diet and nutrition specialist (dietitian) to adjust your eating plan to your individual calorie needs. Reading food labels   Check food labels for the amount of sodium per serving. Choose foods with less than 5 percent of the Daily Value of sodium. Generally, foods with less than 300 mg of sodium per serving fit into this eating plan.  To find whole grains, look for the word "whole" as the first word in the ingredient list. Shopping  Buy products labeled as "low-sodium" or "no salt added."  Buy fresh foods. Avoid canned foods and premade or frozen meals. Cooking  Avoid adding salt when cooking. Use salt-free seasonings or herbs instead of table salt or sea salt. Check with your health care provider or pharmacist before using salt substitutes.  Do not fry foods. Cook foods using healthy methods such as baking, boiling, grilling, and broiling instead.  Cook with heart-healthy oils, such as olive, canola, soybean, or sunflower oil. Meal planning  Eat a balanced diet that includes: ? 5 or more servings of fruits and vegetables  each day. At each meal, try to fill half of your plate with fruits and vegetables. ? Up to 6-8 servings of whole grains each day. ? Less than 6 oz of lean meat, poultry, or fish each day. A 3-oz serving of meat is about the same size as a deck of cards. One egg equals 1 oz. ? 2 servings of low-fat dairy each day. ? A serving of nuts, seeds, or beans 5 times each week. ? Heart-healthy fats. Healthy fats called Omega-3 fatty acids are found in foods such as flaxseeds and coldwater fish, like sardines, salmon, and mackerel.  Limit how much you eat of the following: ? Canned or prepackaged foods. ? Food that is high in trans fat, such as fried foods. ? Food that is high in saturated fat, such as fatty meat. ? Sweets, desserts, sugary drinks, and other foods with added sugar. ? Full-fat dairy products.  Do not salt foods before eating.  Try to eat at least 2 vegetarian meals each week.  Eat more home-cooked food and less restaurant, buffet, and fast food.  When eating at a restaurant, ask that your food be prepared with less salt or no salt, if possible. What foods are recommended? The items listed may not be a complete list. Talk with your dietitian about what dietary choices are best for you. Grains Whole-grain or whole-wheat bread. Whole-grain or whole-wheat pasta. Brown rice. [place]. Bulgur. Whole-grain and low-sodium cereals. Pita bread. Low-fat, low-sodium crackers. Whole-wheat flour tortillas. Vegetables Fresh or frozen vegetables (raw, steamed, roasted, or grilled). Low-sodium or reduced-sodium tomato and vegetable juice. Low-sodium or reduced-sodium tomato sauce and tomato paste. Low-sodium or reduced-sodium canned vegetables. Fruits All fresh, dried, or frozen fruit. Canned fruit in natural juice (without added sugar). Meat and other protein foods Skinless chicken or [place]. Ground chicken or  [place]. Pork with fat trimmed off. Fish and seafood. Egg whites. Dried beans,  peas, or lentils. Unsalted nuts, nut butters, and seeds. Unsalted canned beans. Lean cuts of beef with fat trimmed off. Low-sodium, lean deli meat. Dairy Low-fat (1%) or fat-free (skim) milk. Fat-free, low-fat, or reduced-fat cheeses. Nonfat, low-sodium ricotta or cottage cheese. Low-fat or nonfat yogurt. Low-fat, low-sodium cheese. Fats and oils Soft margarine without trans fats. Vegetable oil. Low-fat, reduced-fat, or light mayonnaise and salad dressings (reduced-sodium). Canola, safflower, olive, soybean, and sunflower oils. Avocado. Seasoning and other foods Herbs. Spices. Seasoning mixes without salt. Unsalted popcorn and pretzels. Fat-free sweets. What foods are not recommended? The items listed may not be a complete list. Talk with your dietitian about what dietary choices are best for you. Grains Baked goods made with fat, such as croissants, muffins, or some breads. Dry pasta or rice meal packs. Vegetables Creamed or fried vegetables. Vegetables in a cheese sauce. Regular canned vegetables (not low-sodium or reduced-sodium). Regular canned tomato sauce and paste (not low-sodium or reduced-sodium). Regular tomato and vegetable juice (not low-sodium or reduced-sodium). Angie Fava. Olives. Fruits Canned fruit in a light or heavy syrup. Fried fruit. Fruit in cream or butter sauce. Meat and other protein foods Fatty cuts of meat. Ribs. Fried meat. Berniece Salines. Sausage. Bologna and other processed lunch meats. Salami. Fatback. Hotdogs. Bratwurst. Salted nuts and seeds. Canned beans with added salt. Canned or smoked fish. Whole eggs or egg yolks. Chicken or [place] with skin. Dairy Whole or 2% milk, cream, and half-and-half. Whole or full-fat cream cheese. Whole-fat or sweetened yogurt. Full-fat cheese. Nondairy creamers. Whipped toppings. Processed cheese and cheese spreads. Fats and oils Butter. Stick margarine. Lard. Shortening. Ghee. Bacon fat. Tropical oils, such as coconut, palm kernel, or palm  oil. Seasoning and other foods Salted popcorn and pretzels. Onion salt, garlic salt, seasoned salt, table salt, and sea salt. Worcestershire sauce. Tartar sauce. Barbecue sauce. Teriyaki sauce. Soy sauce, including reduced-sodium. Steak sauce. Canned and packaged gravies. Fish sauce. Oyster sauce. Cocktail sauce. Horseradish that you find on the shelf. Ketchup. Mustard. Meat flavorings and tenderizers. Bouillon cubes. Hot sauce and Tabasco sauce. Premade or packaged marinades. Premade or packaged taco seasonings. Relishes. Regular salad dressings. Where to find more information:  National Heart, Lung, and [place]: https://wilson-eaton.com/  American Heart Association: www.heart.org Summary  The DASH eating plan is a healthy eating plan that has been shown to reduce high blood pressure (hypertension). It may also reduce your risk for type 2 diabetes, heart disease, and stroke.  With the DASH eating plan, you should limit salt (sodium) intake to 2,300 mg a day. If you have hypertension, you may need to reduce your sodium intake to 1,500 mg a day.  When on the DASH eating plan, aim to eat more fresh fruits and vegetables, whole grains, lean proteins, low-fat dairy, and heart-healthy fats.  Work with your health care provider or diet and nutrition specialist (dietitian) to adjust your eating plan to your individual calorie needs. This information is not intended to replace advice given to you by your health care provider. Make sure you discuss any questions you have with your health care provider. Document Revised: 05/20/2017 Document Reviewed: 05/31/2016 Elsevier Patient Education  [employer].   If you need refills please call your pharmacy. Do not use My Chart to request refills or for acute issues that need immediate attention.    Please be sure medication list is accurate. If a new problem present, please set up appointment  sooner than planned today.

## 2019-11-02 NOTE — Assessment & Plan Note (Signed)
Continue Aspirin 81 mg daily and atorvastatin 40 mg daily.

## 2019-11-02 NOTE — Assessment & Plan Note (Signed)
Problem is now well controlled. She agrees with trying amlodipine 2.5 mg at bedtime, if BP is still elevated in 2 to 3 weeks, she was instructed to increase dose of amlodipine to 5 mg. Low salt diet recommended. Instructed to monitor BP regularly. Follow-up in 2 months, before if needed.

## 2019-11-06 ENCOUNTER — Ambulatory Visit: Payer: Medicare Other | Admitting: Internal Medicine

## 2019-11-15 ENCOUNTER — Encounter: Payer: Self-pay | Admitting: Primary Care

## 2019-11-15 ENCOUNTER — Ambulatory Visit (INDEPENDENT_AMBULATORY_CARE_PROVIDER_SITE_OTHER): Payer: Medicare Other | Admitting: Primary Care

## 2019-11-15 ENCOUNTER — Other Ambulatory Visit: Payer: Self-pay

## 2019-11-15 VITALS — BP 144/76 | HR 67 | Temp 98.6°F | Ht 62.0 in | Wt 149.6 lb

## 2019-11-15 DIAGNOSIS — J189 Pneumonia, unspecified organism: Secondary | ICD-10-CM

## 2019-11-15 DIAGNOSIS — C3492 Malignant neoplasm of unspecified part of left bronchus or lung: Secondary | ICD-10-CM | POA: Diagnosis not present

## 2019-11-15 DIAGNOSIS — J449 Chronic obstructive pulmonary disease, unspecified: Secondary | ICD-10-CM | POA: Diagnosis not present

## 2019-11-15 DIAGNOSIS — R6889 Other general symptoms and signs: Secondary | ICD-10-CM | POA: Diagnosis not present

## 2019-11-15 MED ORDER — LEVOFLOXACIN 500 MG PO TABS: 500.0000 mg | ORAL_TABLET | Freq: Every day | ORAL | 0 refills | Status: DC

## 2019-11-15 NOTE — Patient Instructions (Addendum)
Recommendations: Continue Breo one puff daily (rinse mouth after use) Continue Robitussin 12 cough relief twice a day  Use flutter valve three times a day followed by deep breathing exercises for 2 weeks  Sending in prescription for Levaquin 1 tab daily x 1 week   Orders: CXR in 6 weeks   Follow-up 6 weeks with APP, please come 20 mins early and get CXR done

## 2019-11-15 NOTE — Progress Notes (Signed)
@Patient  ID: Felicia Herrera, female    DOB: 1955/09/25, 64 y.o.   MRN: [id number]  Chief Complaint  Patient presents with  . Follow-up    sob-same,cough occass. yellow    Referring provider: [name], [name], MD  HPI: 64 year old female, former smoker quit in 2007. 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 Patient of Dr. Chase Caller.   Previous [facility] pulmonary encounters: 03/05/2019 - Annual follow-up Patient presents today for a regular follow-up.  She has not been seen in over a year.  She is doing well today but reports increased shortness of breath and decreased stamina x1 month.  Also reports head pressure on 5 different occurrences.  These symptoms have since resolved 1 week ago.  She did notify her PCP and was told to follow-up with pulmonary.  She is feeling much better today.  She typically always has some cough with mucus production.  Reports that her cough today is productive with yellow/green mucus. Taking Delsym at night.  She reports chronic right side pleuritic pain which is not new.  She denies chest tightness or wheezing.  She has not checked her temperature but reports evening chills/sweats.  He has not been tested for COVID.  03/13/2019- 1 week fu Patient contacted today for 1 week follow-up televisit. Hx non-small cell lung cancer of left upper lobe diagnosed in 2018, s/p concurrent chemoradiation. Following with Dr. Earlie Server, last seen on 01/17/19. She was treated for questionable pneumonia with Levaquin back in July 2020. She had repeat CT scan of the chest performed, images reviewed by Dr. Julien Nordmann and discussed. Her scan showed no concerning findings for disease progression except for the multiple fracture on the left side ribs secondary to persistent cough. CTA on 03/12/19 was negative for PE but showed a new  apparent pneumonia in the posterior segment of LLL. Multiple nodular opacities are noted on the left, several slightly larger compared to the previous study. Question foci pneumonia versus possible neoplasm spread. Recommending repeat CT after treatment for pneumonia in 4-6 weeks. She continues to have productive cough with yellow-green mucus. Associated fatigue and dyspnea on exertion. She has not taken her temperature but reports chills/night sweats. She has one day left of doxycyline. Not currently taking mucinex. Covid testing was negative.  03/27/2019- 2 week fu Patient contacted today for two week follow-up visit. She has been feeling better but not great. She still has congested cough with green-yellow sputum. Continue mucinex twice daily. She has not been using her flutter valve as often as prescribed. Completed Levaquin 500mg  x7 days. Denies fever. Covid negative.   04/13/2019- Acute televisit Patient contacted today for acute televisit for increased shortness of breath and cough x 2 days. Cough is productive with green mucus. Associated chills and muscle aches. She was diagnosed with left lower lobe pneumonia in September and treated with Levqauin. Sputum positive for pseudomonas. States that she felt better initially after completing antibiotic but symptoms returned soon after. She is eating and drinking normally. Taking mucinex and using her flutter valve. She continues using BREO daily as prescribed, she does not have rescue inhaler currently. Sent in RX for ciprofloxacin d/t history positive sputum cultures for pseudomonas.   04/23/2019 Patient contacted today for 1 week follow-up. She is feeling much better. States that her cough is not nearly as noticeable. Still getting up some yellow-green mucus which  has improve in color. She has once dose left of Ciprofloxacin. No significant shortness of breath. Continues Breo 200 one puff daily. She had a repeat CT chest on 04/19/19 showed nearly  resolved left lower lobe pneumonia, stable appearance of upper normal mediastinal lymph nodes. She saw Dr. Julien Nordmann today, no evidence of disease progression. Plan continue to observe, repeat CT CHEST in 6 months with oncology.   11/15/2019 Patient presents today for 6 months follow-up. Hx stage IIB non-small cell lung cancer. Since last visit she recently had a HIDA scan with Dr. Julien Nordmann office which showed no significant evidence of disease progression. She did have a new opacity left lung suspicious for inflammatory process. Plan repeat CT chest in 6 months. Blood pressure has been elevated, she was started on Amlodipine. She feels really well on new medication.  Shortness of breath is the same. She continues to have a productive cough with yellow mucus. She uses her flutter valve on an as needed base only. Takes Robitussin at bedtime. Afebrile.   No Known Allergies  Immunization History  Administered Date(s) Administered  . Influenza, Quadrivalent, Recombinant, Inj, Pf 03/12/2018  . Influenza,inj,Quad PF,6+ Mos 03/21/2016, 02/22/2017, 03/06/2019  . PFIZER SARS-COV-2 Vaccination 09/10/2019, 10/02/2019  . Tdap 06/01/2019  . Zoster Recombinat (Shingrix) 06/01/2019, 08/13/2019    Past Medical History:  Diagnosis Date  . Adenocarcinoma of left lung, stage 2 ([place]) 12/27/2016  . Cancer [facility])    Lun cancer: Right Dx 2008, s/p lobectomy. Left Dx 09/2016  . COPD (chronic obstructive pulmonary disease) ([place])   . Encounter for antineoplastic chemotherapy 12/27/2016  . History of radiation therapy 01/24/17-03/08/17   left lung 2 Gy in 30 fractions  . Hyperlipidemia   . Hypertension   . Stroke [facility])     Tobacco History: Social History   Tobacco Use  Smoking Status Former Smoker  . Packs/day: 1.00  . Years: 30.00  . Pack years: 30.00  . Types: Cigarettes  . Quit date: 06/21/2005  . Years since quitting: 14.4  Smokeless Tobacco Never Used   Counseling given: Not Answered   Outpatient  Medications Prior to Visit  Medication Sig Dispense Refill  . albuterol (VENTOLIN HFA) 108 (90 Base) MCG/ACT inhaler Inhale 2 puffs into the lungs every 6 (six) hours as needed for wheezing or shortness of breath. 6.7 g 1  . amLODipine (NORVASC) 5 MG tablet Take 1 tablet (5 mg total) by mouth daily. 90 tablet 0  . aspirin EC 81 MG tablet Take 81 mg by mouth daily.    Marland Kitchen atorvastatin (LIPITOR) 40 MG tablet TAKE 1 TABLET BY MOUTH  DAILY 90 tablet 3  . betamethasone dipropionate (DIPROLENE) 0.05 % cream Apply topically daily as needed. 30 g 2  . BREO ELLIPTA 200-25 MCG/INH AEPB USE 1 INHALATION BY MOUTH  DAILY AT THE SAME TIME EACH DAY 180 each 3  . clotrimazole (LOTRIMIN) 1 % cream Apply 1 application topically 2 (two) times daily. (Patient taking differently: Apply 1 application topically 2 (two) times daily as needed. ) 30 g 2  . fluticasone (FLONASE) 50 MCG/ACT nasal spray Place 1 spray into both nostrils 2 (two) times daily. (Patient taking differently: Place 1 spray into both nostrils 2 (two) times daily as needed. ) 16 g 6  . guaiFENesin-dextromethorphan (ROBITUSSIN DM) 100-10 MG/5ML syrup Take 5 mLs by mouth every 4 (four) hours as needed for cough.    . levothyroxine (SYNTHROID) 75 MCG tablet TAKE 1 TABLET BY MOUTH  DAILY BEFORE BREAKFAST 90  tablet 3  . nystatin cream (MYCOSTATIN) Apply 1 application topically 2 (two) times daily. (Patient taking differently: Apply 1 application topically 2 (two) times daily as needed. ) 30 g 0  . omeprazole (PRILOSEC) 20 MG capsule TAKE 1 CAPSULE BY MOUTH  DAILY 90 capsule 3  . oxyCODONE-acetaminophen (PERCOCET/ROXICET) 5-325 MG tablet Take 0.5 tablets by mouth every 8 (eight) hours as needed for severe pain. 45 tablet 0  . prochlorperazine (COMPAZINE) 10 MG tablet Take 1 tablet (10 mg total) by mouth every 6 (six) hours as needed for nausea or vomiting. 30 tablet 0  . Respiratory Therapy Supplies (FLUTTER) DEVI Use as directed 1 each 0  . guaiFENesin  (MUCINEX) 600 MG 12 hr tablet Take 1 tablet (600 mg total) by mouth 2 (two) times daily. (Patient not taking: Reported on 11/02/2019) 60 tablet 2   Facility-Administered Medications Prior to Visit  Medication Dose Route Frequency Provider Last Rate Last Admin  . sodium chloride flush (NS) 0.9 % injection 10 mL  10 mL Intracatheter PRN Curt Bears, MD   10 mL at 06/22/17 1747    Review of Systems  Review of Systems  Constitutional: Negative.   Respiratory: Positive for cough and shortness of breath. Negative for chest tightness and wheezing.   Cardiovascular: Negative.     Physical Exam  BP (!) 144/76 (BP Location: Left Arm, Cuff Size: Normal)   Pulse 67   Temp 98.6 F (37 C) (Temporal)   Ht 5\' 2"  (1.575 m)   Wt 149 lb 9.6 oz (67.9 kg)   SpO2 95%   BMI 27.36 kg/m  Physical Exam Constitutional:      Appearance: Normal appearance.  HENT:     Head: Normocephalic and atraumatic.     Mouth/Throat:     Mouth: Mucous membranes are moist.     Pharynx: Oropharynx is clear.  Cardiovascular:     Rate and Rhythm: Normal rate and regular rhythm.  Pulmonary:     Effort: Pulmonary effort is normal.     Breath sounds: Rales present. No wheezing.     Comments: Rales LLL Neurological:     General: No focal deficit present.     Mental Status: She is alert and oriented to person, place, and time. Mental status is at baseline.  Psychiatric:        Mood and Affect: Mood normal.        Behavior: Behavior normal.        Thought Content: Thought content normal.        Judgment: Judgment normal.      Lab Results:  CBC    Component Value Date/Time   WBC 6.8 10/22/2019 0943   WBC 8.4 03/05/2019 1237   RBC 5.14 (H) 10/22/2019 0943   HGB 11.8 (L) 10/22/2019 0943   HGB 11.5 (L) 06/22/2017 1335   HCT 39.3 10/22/2019 0943   HCT 35.5 06/22/2017 1335   PLT 347 10/22/2019 0943   PLT 332 06/22/2017 1335   MCV 76.5 (L) 10/22/2019 0943   MCV 83.5 06/22/2017 1335   MCH 23.0 (L)  10/22/2019 0943   MCHC 30.0 10/22/2019 0943   RDW 19.2 (H) 10/22/2019 0943   RDW 14.8 (H) 06/22/2017 1335   LYMPHSABS 0.9 10/22/2019 0943   LYMPHSABS 0.7 (L) 06/22/2017 1335   MONOABS 0.5 10/22/2019 0943   MONOABS 0.6 06/22/2017 1335   EOSABS 0.0 10/22/2019 0943   EOSABS 0.1 06/22/2017 1335   BASOSABS 0.0 10/22/2019 0943   BASOSABS 0.1 06/22/2017 1335  BMET    Component Value Date/Time   NA 144 10/22/2019 0943   NA 142 06/22/2017 1335   K 3.6 10/22/2019 0943   K 3.9 06/22/2017 1335   CL 103 10/22/2019 0943   CO2 26 10/22/2019 0943   CO2 26 06/22/2017 1335   GLUCOSE 156 (H) 10/22/2019 0943   GLUCOSE 100 06/22/2017 1335   BUN 12 10/22/2019 0943   BUN 13.6 06/22/2017 1335   CREATININE 0.96 10/22/2019 0943   CREATININE 0.7 06/22/2017 1335   CALCIUM 9.2 10/22/2019 0943   CALCIUM 9.4 06/22/2017 1335   GFRNONAA >60 10/22/2019 0943   GFRAA >60 10/22/2019 0943    BNP No results found for: BNP  ProBNP    Component Value Date/Time   PROBNP 249.0 (H) 03/05/2019 1237    Imaging: CT Chest W Contrast  Result Date: 10/22/2019 CLINICAL DATA:  Non-small cell lung cancer staging, follow-up XRT, chemo, immunotherapy EXAM: CT CHEST WITH CONTRAST TECHNIQUE: Multidetector CT imaging of the chest was performed during intravenous contrast administration. CONTRAST:  59mL OMNIPAQUE IOHEXOL 300 MG/ML  SOLN COMPARISON:  04/19/2019 FINDINGS: Cardiovascular: Aortic atherosclerosis. Right chest port catheter. Normal heart size. No pericardial effusion. Mediastinum/Nodes: No enlarged mediastinal, hilar, or axillary lymph nodes. Thyroid gland, trachea, and esophagus demonstrate no significant findings. Lungs/Pleura: Status post right pneumonectomy. Unchanged lower tracheal stent. Mild centrilobular emphysema. Unchanged bandlike perihilar fibrosis and consolidation of the left lung (series 7, image 58). Numerous small irregular opacities of left lower lobe, generally similar to prior examination  although slightly increased. There is a new, conspicuous irregular opacity of the left lower lobe measuring approximately 1.3 cm in total (series 7, image 98). Upper Abdomen: No acute abnormality. Musculoskeletal: No chest wall mass or suspicious bone lesions identified. IMPRESSION: 1. Redemonstrated post treatment appearance of the perihilar left lung. 2.  Status post right pneumonectomy. 3. Numerous small irregular opacities of left lower lobe, generally similar to prior examination although slightly increased. There is a new, conspicuous irregular opacity of the left lower lobe measuring approximately 1.3 cm in total. These findings are likely infectious or inflammatory, metastatic disease, less favored, although warrant attention on follow-up. 4.  Emphysema (ICD10-J43.9). 5.  Aortic Atherosclerosis (ICD10-I70.0). Electronically Signed   By: Eddie Candle M.D.   On: 10/22/2019 12:54     Assessment & Plan:   Left lower lobe pneumonia ([place]) - Productive cough with shortness of breath - CT chest with oncology in early May showed new opacity left lung. Previous sputum culture positive for pseudomonas.  - Sending in prescription for Levaquin 500mg  1 tab daily x 7 days  - Advised patient take Robitussin 12 cough relief twice a day and use flutter valve three times a day  - Follow-up: 6 weeks with repeat CXR   Adenocarcinoma of left lung, stage 2 ([place]) - Following with Dr. Julien Nordmann, HIDA scan in May 2021 showed no evidence of disease reoccurrence. Plan repeat CT chest in 6 months   Stage 2 moderate COPD by GOLD classification [facility]) - Former smoker, quit in 2007 - Maintained on Breo 200 one puff daily  - Consider repeat PFTs last done in 2018, recommend reducing ICS/LABA or changing to LABA/LAMA at next visit d.t recurrent pneumonia    Martyn Ehrich, NP 11/18/2019

## 2019-11-18 NOTE — Assessment & Plan Note (Signed)
-   Following with Dr. Julien Nordmann, HIDA scan in May 2021 showed no evidence of disease reoccurrence. Plan repeat CT chest in 6 months

## 2019-11-18 NOTE — Assessment & Plan Note (Addendum)
-   Productive cough with shortness of breath - CT chest with oncology in early May showed new opacity left lung. Previous sputum culture positive for pseudomonas.  - Sending in prescription for Levaquin 500mg  1 tab daily x 7 days  - Advised patient take Robitussin 12 cough relief twice a day and use flutter valve three times a day  - Follow-up: 6 weeks with repeat CXR

## 2019-11-18 NOTE — Assessment & Plan Note (Addendum)
-   Former smoker, quit in 2007 - Maintained on Breo 200 one puff daily  - Consider repeat PFTs last done in 2018, recommend reducing ICS/LABA or changing to LABA/LAMA at next visit d.t recurrent pneumonia

## 2019-11-26 ENCOUNTER — Telehealth: Payer: Self-pay | Admitting: Family Medicine

## 2019-11-26 NOTE — Progress Notes (Signed)
  Chronic Care Management   Outreach Note  11/26/2019 Name: Felicia Herrera MRN: [id number] DOB: May 07, 1956  Referred by: [name], [name], MD Reason for referral : No chief complaint on file.   An unsuccessful telephone outreach was attempted today. The patient was referred to the pharmacist for assistance with care management and care coordination.   Follow Up Plan:   [place]

## 2019-11-26 NOTE — Progress Notes (Signed)
°  Chronic Care Management   Note  11/26/2019 Name: Felicia Herrera MRN: [id number] DOB: 11-19-1955  Felicia Herrera is a 64 y.o. year old female who is a primary care patient of [name], Malka So, MD. I reached out to Kari Baars by phone today in response to a referral sent by [name]'s PCP, [name], [name], MD.   [name] was given information about Chronic Care Management services today including:  1. CCM service includes personalized support from designated clinical staff supervised by her physician, including individualized plan of care and coordination with other care providers 2. 24/7 contact phone numbers for assistance for urgent and routine care needs. 3. Service will only be billed when office clinical staff spend 20 minutes or more in a month to coordinate care. 4. Only one practitioner may furnish and bill the service in a calendar month. 5. The patient may stop CCM services at any time (effective at the end of the month) by phone call to the office staff.   Patient agreed to services and verbal consent obtained.   Follow up plan:  [place]

## 2019-12-03 ENCOUNTER — Other Ambulatory Visit: Payer: Self-pay | Admitting: Family Medicine

## 2019-12-03 ENCOUNTER — Telehealth: Payer: Self-pay | Admitting: Family Medicine

## 2019-12-03 DIAGNOSIS — C3492 Malignant neoplasm of unspecified part of left bronchus or lung: Secondary | ICD-10-CM

## 2019-12-03 DIAGNOSIS — G894 Chronic pain syndrome: Secondary | ICD-10-CM

## 2019-12-03 MED ORDER — OXYCODONE-ACETAMINOPHEN 5-325 MG PO TABS: 0.5000 | ORAL_TABLET | Freq: Three times a day (TID) | ORAL | 0 refills | Status: DC | PRN

## 2019-12-03 NOTE — Telephone Encounter (Signed)
Noted  

## 2019-12-03 NOTE — Telephone Encounter (Signed)
Message Routed to PCP for review and approval. 

## 2019-12-03 NOTE — Telephone Encounter (Signed)
Rx sent. Thanks, BJ 

## 2019-12-03 NOTE — Telephone Encounter (Signed)
Pt call and want a refill on oxyCODONE-acetaminophen (PERCOCET/ROXICET) 5-325 MG sent to  [facility] [address], [place] Phone:  [phone]  Fax:  [phone]

## 2019-12-26 NOTE — Progress Notes (Signed)
@Patient  ID: Felicia Herrera, female    DOB: [birth year], 64 y.o.   MRN: [id number]  Chief Complaint  Patient presents with  . Follow-up    breathing is ok, cough with yellow mucus     Referring provider: [name], [name], MD  HPI: 63 year old female, former smoker quit in 2007. 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 Patient of Dr. Chase Caller.  Previous [facility] pulmonary encounters: 03/05/2019 - Annual follow-up Patient presents today for a regular follow-up.  She has not been seen in over a year.  She is doing well today but reports increased shortness of breath and decreased stamina x1 month.  Also reports head pressure on 5 different occurrences.  These symptoms have since resolved 1 week ago.  She did notify her PCP and was told to follow-up with pulmonary.  She is feeling much better today.  She typically always has some cough with mucus production.  Reports that her cough today is productive with yellow/green mucus. Taking Delsym at night.  She reports chronic right side pleuritic pain which is not new.  She denies chest tightness or wheezing.  She has not checked her temperature but reports evening chills/sweats.  He has not been tested for COVID.  03/13/2019- 1 week fu Patient contacted today for 1 week follow-up televisit. Hx non-small cell lung cancer of left upper lobe diagnosed in 2018, s/p concurrent chemoradiation. Following with Dr. Earlie Server, last seen on 01/17/19. She was treated for questionable pneumonia with Levaquin back in July 2020. She had repeat CT scan of the chest performed, images reviewed by Dr. Julien Nordmann and discussed. Her scan showed no concerning findings for disease progression except for the multiple fracture on the left side ribs secondary to persistent cough. CTA on 03/12/19 was negative for PE but showed  a new apparent pneumonia in the posterior segment of LLL. Multiple nodular opacities are noted on the left, several slightly larger compared to the previous study. Question foci pneumonia versus possible neoplasm spread. Recommending repeat CT after treatment for pneumonia in 4-6 weeks. She continues to have productive cough with yellow-green mucus. Associated fatigue and dyspnea on exertion. She has not taken her temperature but reports chills/night sweats. She has one day left of doxycyline. Not currently taking mucinex. Covid testing was negative.  03/27/2019- 2 week fu Patient contacted today for two week follow-up visit. She has been feeling better but not great. She still has congested cough with green-yellow sputum. Continue mucinex twice daily. She has not been using her flutter valve as often as prescribed. Completed Levaquin 500mg  x7 days. Denies fever. Covid negative.   04/13/2019- Acute televisit Patient contacted today for acute televisit for increased shortness of breath and cough x 2 days. Cough is productive with green mucus. Associated chills and muscle aches. She was diagnosed with left lower lobe pneumonia in September and treated with Levqauin. Sputum positive for pseudomonas. States that she felt better initially after completing antibiotic but symptoms returned soon after. She is eating and drinking normally. Taking mucinex and using her flutter valve. She continues using BREO daily as prescribed, she does not have rescue inhaler currently. Sent in RX for ciprofloxacin d/t history positive sputum cultures for pseudomonas.   04/23/2019 Patient contacted today for 1 week follow-up. She is feeling much better. States that her cough is not nearly as noticeable. Still getting up  some yellow-green mucus which has improve in color. She has once dose left of Ciprofloxacin. No significant shortness of breath. Continues Breo 200 one puff daily. She had a repeat CT chest on 04/19/19 showed nearly  resolved left lower lobe pneumonia, stable appearance of upper normal mediastinal lymph nodes. She saw Dr. Julien Nordmann today, no evidence of disease progression. Plan continue to observe, repeat CT CHEST in 6 months with oncology.   11/15/2019 Patient presents today for 6 months follow-up. Hx stage IIB non-small cell lung cancer. Since last visit she recently had a HIDA scan with Dr. Julien Nordmann office which showed no significant evidence of disease progression. She did have a new opacity left lung suspicious for inflammatory process. Plan repeat CT chest in 6 months. Blood pressure has been elevated, she was started on Amlodipine. She feels really well on new medication.  Shortness of breath is the same. She continues to have a productive cough with yellow mucus. She uses her flutter valve on an as needed base only. Takes Robitussin at bedtime. Afebrile.  12/27/2019- Interim hx Patient presents today for 6 week follow-up pneumonia left lower lobe. States that she is doing a lot better since her last visit in May. Her only complaint today is of constipation. Her LBM was 3 days ago. She has changed her diet recently, she likes to eat a lot of cheese. She has a chronic productive cough with yellow mucus which appears to be her baseline. She completed Levaquin course in early June. She is using flutter valve once a day, twice a day if able. She declined CXR today d/t cost. Denies fever, chest tightness, chest pain, abdominal pain, N/V.   No Known Allergies  Immunization History  Administered Date(s) Administered  . Influenza, Quadrivalent, Recombinant, Inj, Pf 03/12/2018  . Influenza,inj,Quad PF,6+ Mos 03/21/2016, 02/22/2017, 03/06/2019  . PFIZER SARS-COV-2 Vaccination 09/10/2019, 10/02/2019  . Tdap 06/01/2019  . Zoster Recombinat (Shingrix) 06/01/2019, 08/13/2019    Past Medical History:  Diagnosis Date  . Adenocarcinoma of left lung, stage 2 ([place]) 12/27/2016  . Cancer [facility])    Lun cancer: Right Dx 2008, s/p  lobectomy. Left Dx 09/2016  . COPD (chronic obstructive pulmonary disease) ([place])   . Encounter for antineoplastic chemotherapy 12/27/2016  . History of radiation therapy 01/24/17-03/08/17   left lung 2 Gy in 30 fractions  . Hyperlipidemia   . Hypertension   . Stroke [facility])     Tobacco History: Social History   Tobacco Use  Smoking Status Former Smoker  . Packs/day: 1.00  . Years: 30.00  . Pack years: 30.00  . Types: Cigarettes  . Quit date: 06/21/2005  . Years since quitting: 14.5  Smokeless Tobacco Never Used   Counseling given: Not Answered   Outpatient Medications Prior to Visit  Medication Sig Dispense Refill  . albuterol (VENTOLIN HFA) 108 (90 Base) MCG/ACT inhaler Inhale 2 puffs into the lungs every 6 (six) hours as needed for wheezing or shortness of breath. 6.7 g 1  . amLODipine (NORVASC) 5 MG tablet Take 1 tablet (5 mg total) by mouth daily. 90 tablet 0  . aspirin EC 81 MG tablet Take 81 mg by mouth daily.    Marland Kitchen atorvastatin (LIPITOR) 40 MG tablet TAKE 1 TABLET BY MOUTH  DAILY 90 tablet 3  . betamethasone dipropionate (DIPROLENE) 0.05 % cream Apply topically daily as needed. 30 g 2  . BREO ELLIPTA 200-25 MCG/INH AEPB USE 1 INHALATION BY MOUTH  DAILY AT THE SAME TIME EACH DAY 180 each  3  . clotrimazole (LOTRIMIN) 1 % cream Apply 1 application topically 2 (two) times daily. (Patient taking differently: Apply 1 application topically 2 (two) times daily as needed. ) 30 g 2  . fluticasone (FLONASE) 50 MCG/ACT nasal spray Place 1 spray into both nostrils 2 (two) times daily. (Patient taking differently: Place 1 spray into both nostrils 2 (two) times daily as needed. ) 16 g 6  . guaiFENesin (MUCINEX) 600 MG 12 hr tablet Take 1 tablet (600 mg total) by mouth 2 (two) times daily. 60 tablet 2  . guaiFENesin-dextromethorphan (ROBITUSSIN DM) 100-10 MG/5ML syrup Take 5 mLs by mouth every 4 (four) hours as needed for cough.    . levothyroxine (SYNTHROID) 75 MCG tablet TAKE 1 TABLET BY MOUTH   DAILY BEFORE BREAKFAST 90 tablet 3  . nystatin cream (MYCOSTATIN) Apply 1 application topically 2 (two) times daily. (Patient taking differently: Apply 1 application topically 2 (two) times daily as needed. ) 30 g 0  . omeprazole (PRILOSEC) 20 MG capsule TAKE 1 CAPSULE BY MOUTH  DAILY 90 capsule 3  . oxyCODONE-acetaminophen (PERCOCET/ROXICET) 5-325 MG tablet Take 0.5 tablets by mouth every 8 (eight) hours as needed for severe pain. 45 tablet 0  . prochlorperazine (COMPAZINE) 10 MG tablet Take 1 tablet (10 mg total) by mouth every 6 (six) hours as needed for nausea or vomiting. 30 tablet 0  . Respiratory Therapy Supplies (FLUTTER) DEVI Use as directed 1 each 0  . levofloxacin (LEVAQUIN) 500 MG tablet Take 1 tablet (500 mg total) by mouth daily. 7 tablet 0   Facility-Administered Medications Prior to Visit  Medication Dose Route Frequency Provider Last Rate Last Admin  . sodium chloride flush (NS) 0.9 % injection 10 mL  10 mL Intracatheter PRN Curt Bears, MD   10 mL at 06/22/17 1747   Review of Systems  Review of Systems  Constitutional: Negative for chills and fever.  Respiratory: Positive for cough. Negative for chest tightness, shortness of breath and wheezing.   Cardiovascular: Negative for chest pain.  Gastrointestinal: Positive for constipation. Negative for abdominal distention, abdominal pain, blood in stool, diarrhea, nausea and rectal pain.   Physical Exam  BP 130/84 (BP Location: Left Arm, Cuff Size: Normal)   Pulse 92   Temp 98 F (36.7 C) (Oral)   Ht 5\' 2"  (1.575 m)   Wt 148 lb 12.8 oz (67.5 kg)   SpO2 95%   BMI 27.22 kg/m  Physical Exam Constitutional:      Appearance: Normal appearance.  HENT:     Head: Normocephalic and atraumatic.  Cardiovascular:     Rate and Rhythm: Normal rate.  Pulmonary:     Effort: Pulmonary effort is normal. No respiratory distress.     Breath sounds: No wheezing or rhonchi.     Comments: Slightly diminished right lung base but  otherwise clear  Neurological:     Mental Status: She is alert.  Psychiatric:        Mood and Affect: Mood normal.        Behavior: Behavior normal.        Thought Content: Thought content normal.        Judgment: Judgment normal.      Lab Results:  CBC    Component Value Date/Time   WBC 6.8 10/22/2019 0943   WBC 8.4 03/05/2019 1237   RBC 5.14 (H) 10/22/2019 0943   HGB 11.8 (L) 10/22/2019 0943   HGB 11.5 (L) 06/22/2017 1335   HCT 39.3 10/22/2019 0943  HCT 35.5 06/22/2017 1335   PLT 347 10/22/2019 0943   PLT 332 06/22/2017 1335   MCV 76.5 (L) 10/22/2019 0943   MCV 83.5 06/22/2017 1335   MCH 23.0 (L) 10/22/2019 0943   MCHC 30.0 10/22/2019 0943   RDW 19.2 (H) 10/22/2019 0943   RDW 14.8 (H) 06/22/2017 1335   LYMPHSABS 0.9 10/22/2019 0943   LYMPHSABS 0.7 (L) 06/22/2017 1335   MONOABS 0.5 10/22/2019 0943   MONOABS 0.6 06/22/2017 1335   EOSABS 0.0 10/22/2019 0943   EOSABS 0.1 06/22/2017 1335   BASOSABS 0.0 10/22/2019 0943   BASOSABS 0.1 06/22/2017 1335    BMET    Component Value Date/Time   NA 144 10/22/2019 0943   NA 142 06/22/2017 1335   K 3.6 10/22/2019 0943   K 3.9 06/22/2017 1335   CL 103 10/22/2019 0943   CO2 26 10/22/2019 0943   CO2 26 06/22/2017 1335   GLUCOSE 156 (H) 10/22/2019 0943   GLUCOSE 100 06/22/2017 1335   BUN 12 10/22/2019 0943   BUN 13.6 06/22/2017 1335   CREATININE 0.96 10/22/2019 0943   CREATININE 0.7 06/22/2017 1335   CALCIUM 9.2 10/22/2019 0943   CALCIUM 9.4 06/22/2017 1335   GFRNONAA >60 10/22/2019 0943   GFRAA >60 10/22/2019 0943    BNP No results found for: BNP  ProBNP    Component Value Date/Time   PROBNP 249.0 (H) 03/05/2019 1237    Imaging: No results found.   Assessment & Plan:   Left lower lobe pneumonia ([place]) - Recommend follow-up with CXR, patient declined CXR today d/t cost - Clinically she appears well and lungs were clear on exam  - She will have a repeat CT chest in November with Dr. Julien Nordmann    Stage 2  moderate COPD by GOLD classification ([place]) - Stable interval  - PFTs in 2017 showed moderate obstructive lung disease with minimal reversibility - Recommend changing ICS/LABA to LABA/LAMA d/t recurrent pneumonia  - We will due benefits investigation to see what inhaler is covered on her plan   Constipation - Complains of constipation and flatus. No abdominal pain or N/V - Recommend she decrease amount of dairy/cheese in her diet and start colace 100mg  twice daily + probiotic    Martyn Ehrich, NP 12/27/2019

## 2019-12-27 ENCOUNTER — Other Ambulatory Visit: Payer: Self-pay

## 2019-12-27 ENCOUNTER — Ambulatory Visit (INDEPENDENT_AMBULATORY_CARE_PROVIDER_SITE_OTHER): Payer: Medicare Other | Admitting: Primary Care

## 2019-12-27 ENCOUNTER — Encounter: Payer: Self-pay | Admitting: Primary Care

## 2019-12-27 ENCOUNTER — Telehealth: Payer: Self-pay | Admitting: Primary Care

## 2019-12-27 DIAGNOSIS — K5900 Constipation, unspecified: Secondary | ICD-10-CM | POA: Diagnosis not present

## 2019-12-27 DIAGNOSIS — R6889 Other general symptoms and signs: Secondary | ICD-10-CM | POA: Diagnosis not present

## 2019-12-27 DIAGNOSIS — J151 Pneumonia due to Pseudomonas: Secondary | ICD-10-CM

## 2019-12-27 DIAGNOSIS — J449 Chronic obstructive pulmonary disease, unspecified: Secondary | ICD-10-CM

## 2019-12-27 MED ORDER — DOCUSATE SODIUM 100 MG PO CAPS: 100.0000 mg | ORAL_CAPSULE | Freq: Two times a day (BID) | ORAL | 0 refills | Status: DC

## 2019-12-27 MED ORDER — SACCHAROMYCES BOULARDII 250 MG PO CAPS
250.0000 mg | ORAL_CAPSULE | Freq: Two times a day (BID) | ORAL | 0 refills | Status: DC
Start: 2019-12-27 — End: 2020-01-07

## 2019-12-27 NOTE — Assessment & Plan Note (Addendum)
-   Stable interval  - PFTs in 2017 showed moderate obstructive lung disease with minimal reversibility - Recommend changing ICS/LABA to LABA/LAMA d/t recurrent pneumonia  - We will due benefits investigation to see what inhaler is covered on her plan

## 2019-12-27 NOTE — Assessment & Plan Note (Signed)
-   Complains of constipation and flatus. No abdominal pain or N/V - Recommend she decrease amount of dairy/cheese in her diet and start colace 100mg  twice daily + probiotic

## 2019-12-27 NOTE — Patient Instructions (Addendum)
Constipation: - Limit dairy and cheese intake for the next 1-2 weeks to see if this helps with your symptoms - Start Colace 100mg  twice daily (stool softener) - Start probiotic twice daily (establish good bacterial in GI tract)  Pneumonia left lower lobe: - Recommend follow-up with CXR, you declined CXR today. Clinically you appear well and lungs were clear on exam   COPD: - Continue Breo 200 one puff daily  - Continue Flutter valve 1-2 times a day  Follow-up: - 3 months with Dr. Chase Caller (you have not seen primary pulmonary MD in some time, happy to see you after you followed up with MD once)

## 2019-12-27 NOTE — Telephone Encounter (Signed)
Can you do benefit investigation for LABA/LAMA for this patient. What is cheapest option for her

## 2019-12-27 NOTE — Telephone Encounter (Signed)
No problem. Thanks 

## 2019-12-27 NOTE — Assessment & Plan Note (Signed)
-   Recommend follow-up with CXR, patient declined CXR today d/t cost - Clinically she appears well and lungs were clear on exam  - She will have a repeat CT chest in November with Dr. Julien Nordmann

## 2019-12-27 NOTE — Telephone Encounter (Signed)
Ralph Leyden is out until Monday and will complete upon her return.  Thanks! Museum/gallery conservator

## 2019-12-31 ENCOUNTER — Other Ambulatory Visit: Payer: Self-pay | Admitting: Family Medicine

## 2019-12-31 DIAGNOSIS — G894 Chronic pain syndrome: Secondary | ICD-10-CM

## 2019-12-31 DIAGNOSIS — C3492 Malignant neoplasm of unspecified part of left bronchus or lung: Secondary | ICD-10-CM

## 2019-12-31 NOTE — Telephone Encounter (Signed)
Please let patient know I want to change BREO to [facility], inhaled steriods can cause increased in recurrent pneumonia. This will cost her $9.20 for 1 month supply.   Can you please send in prescription for Anoro one puffs daily and discontinue Breo.

## 2019-12-31 NOTE — Telephone Encounter (Signed)
Ran test claims for 1 month supply:  Stiolto- $9.20 Anoro- $9.20 Bevespi- $9.20

## 2019-12-31 NOTE — Telephone Encounter (Signed)
Rx last filled 12/03/19.

## 2019-12-31 NOTE — Telephone Encounter (Signed)
Attempted to call pt but unable to reach. Left message for pt to return call. 

## 2019-12-31 NOTE — Telephone Encounter (Signed)
Pt is calling in stating that she needs a refill on Rx Oxycodone   Pharm:  Kristopher Oppenheim at [place] [facility])

## 2020-01-01 ENCOUNTER — Telehealth: Payer: Self-pay | Admitting: Family Medicine

## 2020-01-01 MED ORDER — ANORO ELLIPTA 62.5-25 MCG/INH IN AEPB: 1.0000 | INHALATION_SPRAY | Freq: Every day | RESPIRATORY_TRACT | 5 refills | Status: DC

## 2020-01-01 MED ORDER — OXYCODONE-ACETAMINOPHEN 5-325 MG PO TABS: 0.5000 | ORAL_TABLET | Freq: Three times a day (TID) | ORAL | 0 refills | Status: DC | PRN

## 2020-01-01 NOTE — Telephone Encounter (Signed)
Transferred pt to Triage Nurse. Pt fell while taking a shower and thinks she broke her rib.

## 2020-01-01 NOTE — Telephone Encounter (Signed)
Triage nurse called and transferred the pt to the office stating that the pt need to be seen within the next 24 hrs.  Pt was thinking she may have fractured her ribs.  Pt declined to make a appointment and to go to the ER.  Pt stated that she feels that we nor the hospital can do anything to assist her.  And she stated that she does not have a ride and have to call [employer] for them to get her a ride it would be 3 days before she could get a ride.  Pt stated that she just want to do what she wants to do and will call us if she feels that she needs to come in in the next few day.  Pt was advised to go to the ER for assistance.

## 2020-01-01 NOTE — Addendum Note (Signed)
Addended by: Nathanial Millman E on: 01/01/2020 04:09 PM   Modules accepted: Orders

## 2020-01-01 NOTE — Telephone Encounter (Signed)
Called patient and let her know we are switching her to Anoro. Patient advised of the message from Derl Barrow NP. Patient also reports having a fall in the shower and hurting her chest. Phone number to PCP provided, she is going to call and make an appointment today. She thinks she may have broken a rib. Script sent to [facility].

## 2020-01-01 NOTE — Telephone Encounter (Signed)
Patient returning call.  [phone]

## 2020-01-01 NOTE — Telephone Encounter (Signed)
fyi

## 2020-01-04 ENCOUNTER — Telehealth: Payer: Self-pay | Admitting: Primary Care

## 2020-01-04 NOTE — Telephone Encounter (Signed)
LMTCB X1 

## 2020-01-04 NOTE — Telephone Encounter (Signed)
She can stop taking florastor - please take of medication list

## 2020-01-04 NOTE — Telephone Encounter (Signed)
Called and spoke with pt who stated that she has been having diarrhea from the florastor. Stated after the first day when she had diarrhea, she did not take it for 5 days and the diarrhea had subsided. Pt took it again yesterday 7/15 and then the diarrhea started again.  Pt wants to know what we recommend due to her having the diarrhea from the florastor. Beth, please advise.

## 2020-01-07 NOTE — Telephone Encounter (Signed)
Spoke with the pt and notified of response per [facility] and she verbalized understanding. Medication removed from [facility].

## 2020-01-07 NOTE — Addendum Note (Signed)
Addended by: Martyn Ehrich on: 01/07/2020 03:34 PM   Modules accepted: Orders

## 2020-01-21 ENCOUNTER — Telehealth: Payer: Self-pay

## 2020-01-21 NOTE — Chronic Care Management (AMB) (Signed)
Chronic Care Management Pharmacy  Name: Felicia Herrera  MRN: [id number] DOB: 1955/07/26   Chief Complaint/ HPI  Kari Baars,  64 y.o. , female presents for their Initial CCM visit with the clinical pharmacist In office.  PCP : [name], [name], MD Patient Care Team: [name], [name], MD as PCP - General (Family Medicine) Germaine Pomfret, [facility] as Pharmacist (Pharmacist)  Their chronic conditions include: Hypertension, Coronary Artery Disease, COPD, Hypothyroidism and Lung Cancer    Paperwork, Pinterest, watching televsion, facebook.  Swimming. As often as possible (friends house). Walks at least 1 mile   Office Visits: 11/02/19: Patient presented to Dr. [name] for follow-up. Ciprofloxacin stopped (completed course), oxycodone-APAP refilled. Patient started on amlodipine 5 mg daily.   Consult Visit: 01/04/20: Patient stopped Florastor (Diarrhea)  12/27/19: Patient presented to Geraldo Pitter, NP for pneumonia. Levofloxacin and Breo Ellipta stopped. Patient started on [employer], Florastor, docusate. 11/15/19: Patient presented to Geraldo Pitter, NP for pneumonia. Patient started on Levofloxacin 500 mg daily for 7 days.  10/24/19: Patient presented to Dr. Julien Nordmann for follow-up. Patient s/p carboplatin +paclitaxel and Durvalumab.   No Known Allergies  Medications: Outpatient Encounter Medications as of 01/22/2020  Medication Sig Note   albuterol (VENTOLIN HFA) 108 (90 Base) MCG/ACT inhaler Inhale 2 puffs into the lungs every 6 (six) hours as needed for wheezing or shortness of breath.    amLODipine (NORVASC) 5 MG tablet Take 1 tablet (5 mg total) by mouth daily. (Patient taking differently: Take 2.5 mg by mouth daily. )    aspirin EC 81 MG tablet Take 81 mg by mouth daily.    atorvastatin (LIPITOR) 40 MG tablet TAKE 1 TABLET BY MOUTH  DAILY    fluticasone (FLONASE) 50 MCG/ACT nasal spray Place 1 spray into both nostrils 2 (two) times daily. (Patient taking  differently: Place 1 spray into both nostrils 2 (two) times daily as needed. )    guaiFENesin-dextromethorphan (ROBITUSSIN DM) 100-10 MG/5ML syrup Take 5 mLs by mouth every 4 (four) hours as needed for cough.    levothyroxine (SYNTHROID) 75 MCG tablet TAKE 1 TABLET BY MOUTH  DAILY BEFORE BREAKFAST    nystatin cream (MYCOSTATIN) Apply 1 application topically 2 (two) times daily. (Patient taking differently: Apply 1 application topically 2 (two) times daily as needed. )    omeprazole (PRILOSEC) 20 MG capsule TAKE 1 CAPSULE BY MOUTH  DAILY    oxyCODONE-acetaminophen (PERCOCET/ROXICET) 5-325 MG tablet Take 0.5 tablets by mouth every 8 (eight) hours as needed for severe pain.    Probiotic Product (PROBIOTIC ADVANCED PO) Take 1 capsule by mouth daily.    umeclidinium-vilanterol (ANORO ELLIPTA) 62.5-25 MCG/INH AEPB Inhale 1 puff into the lungs daily.    betamethasone dipropionate (DIPROLENE) 0.05 % cream Apply topically daily as needed.    clotrimazole (LOTRIMIN) 1 % cream Apply 1 application topically 2 (two) times daily. (Patient taking differently: Apply 1 application topically 2 (two) times daily as needed. )    docusate sodium (COLACE) 100 MG capsule Take 1 capsule (100 mg total) by mouth 2 (two) times daily.    guaiFENesin (MUCINEX) 600 MG 12 hr tablet Take 1 tablet (600 mg total) by mouth 2 (two) times daily.    prochlorperazine (COMPAZINE) 10 MG tablet Take 1 tablet (10 mg total) by mouth every 6 (six) hours as needed for nausea or vomiting. (Patient not taking: Reported on 01/22/2020) 03/07/2017: As needed   Respiratory Therapy Supplies (FLUTTER) DEVI Use as directed    Facility-Administered  Encounter Medications as of 01/22/2020  Medication   sodium chloride flush (NS) 0.9 % injection 10 mL     Current Diagnosis/Assessment:  SDOH Interventions     Most Recent Value  SDOH Interventions  Financial Strain Interventions Intervention Not Indicated  Transportation Interventions  Intervention Not Indicated      Goals Addressed            This Visit's Progress    Patient Stated       [place] (see longitudinal plan of care for additional care plan information)  Current Barriers:   Chronic Disease Management support, education, and care coordination needs related to Hypertension, Coronary Artery Disease, COPD, Hypothyroidism and Lung Cancer    Hypertension BP Readings from Last 3 Encounters:  12/27/19 130/84  11/15/19 (!) 144/76  11/02/19 120/88    Pharmacist Clinical Goal(s): o Over the next 90 days, patient will work with PharmD and providers to maintain BP goal <130/80  Current regimen:  o Amlodipine 5 mg 1/2 tablet daily   Interventions: o Discussed low salt diet and exercising as tolerated extensively  Patient self care activities - Over the next 90 days, patient will: o Check blood pressure 1-2 times weekly, document, and provide at future appointments o Ensure daily salt intake < 2300 mg/day  Hyperlipidemia Lab Results  Component Value Date/Time   LDLCALC 60 06/01/2019 07:35 AM    Pharmacist Clinical Goal(s): o Over the next 90 days, patient will work with PharmD and providers to maintain LDL goal < 70  Current regimen:  o Atorvastatin 40 mg daily   Interventions: o Discussed low cholesterol diet and exercising as tolerated extensively  COPD  Pharmacist Clinical Goal(s) o Over the next 90 days, patient will work with PharmD and providers to maintain stable breathing and prevent COPD exacerbations  Current regimen:   Ventolin HFA 2 puffs every 6 hours as needed  Anoro Ellipta 1 puff daily   Interventions: o Reviewed proper inhaler technique  o Reviewed importance of regular maintenance inhaler use to prevent worsening shortness of breath  Patient self care activities - Over the next 90 days, patient will: o Inform Doctor's Office if breathing worsens significantly despite use of rescue inhaler  Medication  management  Pharmacist Clinical Goal(s): o Over the next 90 days, patient will work with PharmD and providers to maintain optimal medication adherence  Current pharmacy: [facility]   Interventions o Comprehensive medication review performed. o Continue current medication management strategy  Patient self care activities - Over the next 90 days, patient will: o Take medications as prescribed o Report any questions or concerns to PharmD and/or provider(s)       COPD   Last spirometry score: FEV1 52% (01/17/17)  Gold Grade: Gold 2 (FEV1 50-79%) Current COPD Classification:  B (high sx, <2 exacerbations/yr)  Eosinophil count:   Lab Results  Component Value Date/Time   EOSPCT 1 10/22/2019 09:43 AM   EOSPCT 1.3 06/22/2017 01:35 PM  %                               Eos (Absolute):  Lab Results  Component Value Date/Time   EOSABS 0.0 10/22/2019 09:43 AM   EOSABS 0.1 06/22/2017 01:35 PM    Tobacco Status:  Social History   Tobacco Use  Smoking Status Former Smoker   Packs/day: 1.00   Years: 30.00   Pack years: 30.00   Types: Cigarettes  Quit date: 06/21/2005   Years since quitting: 14.5  Smokeless Tobacco Never Used    Patient has failed these meds in past: Breo (recurrent pneumonia) Patient is currently controlled on the following medications:   Ventolin HFA 2 puff q6hr PRN   Anoro Ellipta 62.5-25 mcg/inh 1 puff daily   Using maintenance inhaler regularly? Yes Frequency of rescue inhaler use:  1x monthly  We discussed:  proper inhaler technique. Patient has a bitter taste in mouth after using Anoro, but states it goes away after rinsing her mouth with mouthwash.    Plan  Continue current medications  Hypertension   BP goal is:  <130/80  Office blood pressures are  BP Readings from Last 3 Encounters:  12/27/19 130/84  11/15/19 (!) 144/76  11/02/19 120/88   CMP Latest Ref Rng & Units 10/22/2019 04/19/2019 03/12/2019  Glucose 70 - 99 mg/dL 156(H)  118(H) -  BUN 8 - 23 mg/dL 12 20 -  Creatinine 0.44 - 1.00 mg/dL 0.96 0.75 0.40(L)  Sodium 135 - 145 mmol/L 144 141 -  Potassium 3.5 - 5.1 mmol/L 3.6 3.8 -  Chloride 98 - 111 mmol/L 103 105 -  CO2 22 - 32 mmol/L 26 26 -  Calcium 8.9 - 10.3 mg/dL 9.2 9.2 -  Total Protein 6.5 - 8.1 g/dL 7.8 7.5 -  Total Bilirubin 0.3 - 1.2 mg/dL 0.5 <0.2(L) -  Alkaline Phos 38 - 126 U/L 120 114 -  AST 15 - 41 U/L 12(L) 11(L) -  ALT 0 - 44 U/L 11 13 -   Patient checks BP at home weekly Patient home BP readings are ranging: 110/70s   Patient has failed these meds in the past: n/a Patient is currently controlled on the following medications:   Amlodipine 5 mg (takes 1/2 tablet daily at 3pm)   We discussed diet and exercise extensively  Plan  Continue current medications. Recommend updating prescription to reflect current dosing regimen.   Coronary Artery Disease    LDL goal < 70  Lipid Panel     Component Value Date/Time   CHOL 128 06/01/2019 0735   TRIG 128.0 06/01/2019 0735   HDL 42.60 06/01/2019 0735   LDLCALC 60 06/01/2019 0735    Hepatic Function Latest Ref Rng & Units 10/22/2019 04/19/2019 01/15/2019  Total Protein 6.5 - 8.1 g/dL 7.8 7.5 7.2  Albumin 3.5 - 5.0 g/dL 3.6 3.7 3.2(L)  AST 15 - 41 U/L 12(L) 11(L) 11(L)  ALT 0 - 44 U/L '11 13 8  '$ Alk Phosphatase 38 - 126 U/L 120 114 122  Total Bilirubin 0.3 - 1.2 mg/dL 0.5 <0.2(L) 0.3     The ASCVD Risk score Mikey Bussing DC Jr., et al., 2013) failed to calculate for the following reasons:   The valid total cholesterol range is 130 to 320 mg/dL   Patient has failed these meds in past: n/a Patient is currently controlled on the following medications:   Aspirin 81 mg daily  Atorvastatin 40 mg daily (3pm)   We discussed:  diet and exercise extensively  Plan  Continue current medications  Hypothyroidism   Lab Results  Component Value Date/Time   TSH 3.76 06/01/2019 07:35 AM   TSH 2.638 03/29/2018 09:56 AM   TSH 1.830 03/01/2018 11:00  AM   TSH 1.023 06/08/2017 08:45 AM   TSH 0.920 05/10/2017 11:33 AM   TSH 2.36 11/04/2016 12:55 PM   Patient has failed these meds in past: n/a Patient is currently controlled on the following medications:  Levothyroxine 75 mcg daily   We discussed:  Separates by 1-2 hours from other medications (other than omeprazole)   Plan  Continue current medications  Allergic Rhinitis    Patient has failed these meds in past: n/a Patient is currently controlled on the following medications:   Flonase 50 mcg/act 1 spray BID   Mucinex 600 mg BID   Mucinex DM syrup   We discussed:  n/a  Plan  Continue current medications  GERD   Patient has failed these meds in past: n/a Patient is currently controlled on the following medications:   Omeprazole 20 mg daily (Before Breakfast) started 2018   We discussed: Has been on omeprazole since 2018. Since then, has had 1-2 instances of breakthrough reflux symptoms. States she eats a lot of tomatoes. Patient interested in stopping omeprazole if possible. Counseled patient on minimizing acidic/spicy foods and other triggers to minimize reflux symptoms.   Plan  Recommend decreasing omeprazole to every other day dosing and using antacid/H2RA PRN for rebound symptoms.   Misc / OTC     Betamethasone 0.05% cream   Clotrimazole 1% cream   Docusate 100 mg BID (stopped diarrhea)   Nystatin cream  Oxycodone 5-325 mg 1/2 tab q6hr PRN   Prochlorperazine 10 mg q6hr PRN   We discussed:  n/a  Plan  Continue current medications   Medication Management   Pt uses [facility] pharmacy for all medications Uses pill box? Yes  Plan  Continue current medication management strategy  Follow up: 6 month phone visit  [facility] [place]

## 2020-01-21 NOTE — Progress Notes (Addendum)
  Left Voice message to do initial questions prior to patient appointment on 01/22/2020 for CCM at 2:00p.m with Junius Argyle the Clinical pharmacist.   [place] Pharmacist Assistant [phone]

## 2020-01-22 ENCOUNTER — Ambulatory Visit: Payer: Medicare Other

## 2020-01-22 ENCOUNTER — Telehealth: Payer: Self-pay

## 2020-01-22 ENCOUNTER — Other Ambulatory Visit: Payer: Self-pay

## 2020-01-22 DIAGNOSIS — E785 Hyperlipidemia, unspecified: Secondary | ICD-10-CM

## 2020-01-22 DIAGNOSIS — R6889 Other general symptoms and signs: Secondary | ICD-10-CM | POA: Diagnosis not present

## 2020-01-22 DIAGNOSIS — I10 Essential (primary) hypertension: Secondary | ICD-10-CM

## 2020-01-22 DIAGNOSIS — J449 Chronic obstructive pulmonary disease, unspecified: Secondary | ICD-10-CM

## 2020-01-22 NOTE — Telephone Encounter (Signed)
-----   Message from Germaine Pomfret, [facility] sent at 01/22/2020  9:10 AM EDT ----- Regarding: CCM Referral Tre Sanker,   Can you please place a referral to chronic care management for this patient?  Thanks, Doristine Section Clinical Pharmacist [facility] [place]

## 2020-01-27 ENCOUNTER — Other Ambulatory Visit: Payer: Self-pay | Admitting: Family Medicine

## 2020-01-27 DIAGNOSIS — I10 Essential (primary) hypertension: Secondary | ICD-10-CM

## 2020-01-28 ENCOUNTER — Other Ambulatory Visit: Payer: Self-pay | Admitting: Family Medicine

## 2020-01-28 DIAGNOSIS — G894 Chronic pain syndrome: Secondary | ICD-10-CM

## 2020-01-28 DIAGNOSIS — C3492 Malignant neoplasm of unspecified part of left bronchus or lung: Secondary | ICD-10-CM

## 2020-01-28 NOTE — Telephone Encounter (Signed)
Pt call and stated she need a refill on oxyCODONE-acetaminophen (PERCOCET/ROXICET) 5-325 MG tablet  Sent to  [facility] [address] [place] Phone:  [phone]  Fax:  [phone]

## 2020-01-29 ENCOUNTER — Telehealth: Payer: Self-pay | Admitting: Primary Care

## 2020-01-29 NOTE — Telephone Encounter (Signed)
Pt returning call.  [phone]

## 2020-01-29 NOTE — Telephone Encounter (Signed)
Called and spoke with pt who states she does not feel like the Anoro is working for her. Pt states she does not feel well after beginning the Anoro 1.5 weeks ago. She states that she feels very unsure on her feet, is coughing a lot and getting up yellow phlegm. Pt states she also has been wanting to lay in bed almost all day. Pt also states that she has complaints of wheezing.  Pt wants recommendations based on her symptoms after being on the Anoro. Beth, please advise.

## 2020-01-29 NOTE — Telephone Encounter (Signed)
I have discontinued the Anoro from pt's med list and have also added as an allergy due to intolerance. Attempted to call pt but unable to reach. Left message for her to return call.

## 2020-01-29 NOTE — Telephone Encounter (Signed)
Spoke with the pt and notified of Beth's response. She verbalized understanding.

## 2020-01-29 NOTE — Telephone Encounter (Signed)
She can stop Anoro and resume Breo. Please add medication Umeclidinium as allergy- low/intolerance

## 2020-01-30 MED ORDER — OXYCODONE-ACETAMINOPHEN 5-325 MG PO TABS: 0.5000 | ORAL_TABLET | Freq: Three times a day (TID) | ORAL | 0 refills | Status: DC | PRN

## 2020-02-04 DIAGNOSIS — Z1231 Encounter for screening mammogram for malignant neoplasm of breast: Secondary | ICD-10-CM | POA: Diagnosis not present

## 2020-02-04 DIAGNOSIS — R6889 Other general symptoms and signs: Secondary | ICD-10-CM | POA: Diagnosis not present

## 2020-02-11 ENCOUNTER — Telehealth: Payer: Self-pay | Admitting: Family Medicine

## 2020-02-11 NOTE — Telephone Encounter (Signed)
-----   Message from Beverlee Nims sent at 02/11/2020 11:40 AM EDT ----- Ms Mastrangelo was supposed to follow in 12/2019. Can you please arrange follow up appt for pain management and HTN. Thanks, BJ

## 2020-02-11 NOTE — Telephone Encounter (Signed)
Pt has been scheduled.  °

## 2020-02-11 NOTE — Telephone Encounter (Signed)
LVM to schedule

## 2020-02-18 ENCOUNTER — Ambulatory Visit (INDEPENDENT_AMBULATORY_CARE_PROVIDER_SITE_OTHER): Payer: Medicare Other | Admitting: Family Medicine

## 2020-02-18 ENCOUNTER — Other Ambulatory Visit: Payer: Self-pay

## 2020-02-18 ENCOUNTER — Encounter: Payer: Self-pay | Admitting: Family Medicine

## 2020-02-18 VITALS — BP 128/80 | HR 97 | Resp 16 | Ht 62.0 in | Wt 146.0 lb

## 2020-02-18 DIAGNOSIS — G894 Chronic pain syndrome: Secondary | ICD-10-CM

## 2020-02-18 DIAGNOSIS — C3492 Malignant neoplasm of unspecified part of left bronchus or lung: Secondary | ICD-10-CM | POA: Diagnosis not present

## 2020-02-18 DIAGNOSIS — K5900 Constipation, unspecified: Secondary | ICD-10-CM

## 2020-02-18 DIAGNOSIS — F1190 Opioid use, unspecified, uncomplicated: Secondary | ICD-10-CM | POA: Diagnosis not present

## 2020-02-18 DIAGNOSIS — R6889 Other general symptoms and signs: Secondary | ICD-10-CM | POA: Diagnosis not present

## 2020-02-18 DIAGNOSIS — J449 Chronic obstructive pulmonary disease, unspecified: Secondary | ICD-10-CM

## 2020-02-18 NOTE — Progress Notes (Signed)
HPI: Felicia Herrera is a 64 y.o. female, who is here today for follow up.   She was last seen on 11/02/19. Right upper thoracic and axillary pain developed after lung surgery due to lung cancer (lobectomy) in 2008. Pain is constant. She is on Percocet 5-325 mg bid as needed, 45 tabs per month, which she thinks is enough. Pain is exacerbated by movement and alleviated by rest.  Pain is about 8/10. Percocet helps her to relax and tolerate pain better, 6-7/10. She has been on Percocet for about 3-4 years, initiated by oncologist when she started chemo for left lung cancer. Constipation: She was having some hard stools 1-2 weekly. Started a Probiotic, which is helping. She is having bowel movements every other days, not longer hard.  COPD: Following with pulmonologist. She states that her pulmonologist changed her Breo for Anoro Ellipta 6.103mcg/25 mcg. She is c/o cough, exacerbated by using Anoro, felt "awful"; so she is back to [place]. She feels better with Breo. She has not noted fever,chills,or sore throat.  According to pt, Memory Dance was discontinued because recurring pneumonia.  She uses Albuterol inh almost daily.  Review of Systems  Constitutional: Positive for fatigue. Negative for activity change and appetite change.  HENT: Negative for mouth sores, nosebleeds and sore throat.   Eyes: Negative for redness and visual disturbance.  Cardiovascular: Negative for chest pain, palpitations and leg swelling.  Gastrointestinal: Negative for abdominal pain, nausea and vomiting.       Negative for changes in bowel habits.  Genitourinary: Negative for decreased urine volume and hematuria.  Neurological: Negative for syncope, weakness and headaches.  Rest of ROS, see pertinent positives sand negatives in HPI  Current Outpatient Medications on File Prior to Visit  Medication Sig Dispense Refill  . albuterol (VENTOLIN HFA) 108 (90 Base) MCG/ACT inhaler Inhale 2 puffs into the lungs  every 6 (six) hours as needed for wheezing or shortness of breath. 6.7 g 1  . amLODipine (NORVASC) 5 MG tablet TAKE ONE TABLET BY MOUTH DAILY 90 tablet 2  . aspirin EC 81 MG tablet Take 81 mg by mouth daily.    Marland Kitchen atorvastatin (LIPITOR) 40 MG tablet TAKE 1 TABLET BY MOUTH  DAILY 90 tablet 3  . betamethasone dipropionate (DIPROLENE) 0.05 % cream Apply topically daily as needed. 30 g 2  . clotrimazole (LOTRIMIN) 1 % cream Apply 1 application topically 2 (two) times daily. (Patient taking differently: Apply 1 application topically 2 (two) times daily as needed. ) 30 g 2  . docusate sodium (COLACE) 100 MG capsule Take 1 capsule (100 mg total) by mouth 2 (two) times daily. 30 capsule 0  . fluticasone (FLONASE) 50 MCG/ACT nasal spray Place 1 spray into both nostrils 2 (two) times daily. (Patient taking differently: Place 1 spray into both nostrils 2 (two) times daily as needed. ) 16 g 6  . guaiFENesin (MUCINEX) 600 MG 12 hr tablet Take 1 tablet (600 mg total) by mouth 2 (two) times daily. 60 tablet 2  . guaiFENesin-dextromethorphan (ROBITUSSIN DM) 100-10 MG/5ML syrup Take 5 mLs by mouth every 4 (four) hours as needed for cough.    . levothyroxine (SYNTHROID) 75 MCG tablet TAKE 1 TABLET BY MOUTH  DAILY BEFORE BREAKFAST 90 tablet 3  . nystatin cream (MYCOSTATIN) Apply 1 application topically 2 (two) times daily. (Patient taking differently: Apply 1 application topically 2 (two) times daily as needed. ) 30 g 0  . omeprazole (PRILOSEC) 20 MG capsule TAKE 1 CAPSULE BY  MOUTH  DAILY 90 capsule 3  . oxyCODONE-acetaminophen (PERCOCET/ROXICET) 5-325 MG tablet Take 0.5 tablets by mouth every 8 (eight) hours as needed for severe pain. 45 tablet 0  . Probiotic Product (PROBIOTIC ADVANCED PO) Take 1 capsule by mouth daily.    . prochlorperazine (COMPAZINE) 10 MG tablet Take 1 tablet (10 mg total) by mouth every 6 (six) hours as needed for nausea or vomiting. 30 tablet 0  . Respiratory Therapy Supplies (FLUTTER) DEVI Use  as directed 1 each 0   Current Facility-Administered Medications on File Prior to Visit  Medication Dose Route Frequency Provider Last Rate Last Admin  . sodium chloride flush (NS) 0.9 % injection 10 mL  10 mL Intracatheter PRN Curt Bears, MD   10 mL at 06/22/17 1747     Past Medical History:  Diagnosis Date  . Adenocarcinoma of left lung, stage 2 ([place]) 12/27/2016  . Cancer [facility])    Lun cancer: Right Dx 2008, s/p lobectomy. Left Dx 09/2016  . COPD (chronic obstructive pulmonary disease) ([place])   . Encounter for antineoplastic chemotherapy 12/27/2016  . History of radiation therapy 01/24/17-03/08/17   left lung 2 Gy in 30 fractions  . Hyperlipidemia   . Hypertension   . Stroke [facility])    Allergies  Allergen Reactions  . Anoro Ellipta [Umeclidinium-Vilanterol]     Social History   Socioeconomic History  . Marital status: Single    Spouse name: Not on file  . Number of children: Not on file  . Years of education: Not on file  . Highest education level: Not on file  Occupational History  . Occupation: Biomedical scientist  Tobacco Use  . Smoking status: Former Smoker    Packs/day: 1.00    Years: 30.00    Pack years: 30.00    Types: Cigarettes    Quit date: 06/21/2005    Years since quitting: 14.6  . Smokeless tobacco: Never Used  Vaping Use  . Vaping Use: Never used  Substance and Sexual Activity  . Alcohol use: No  . Drug use: No  . Sexual activity: Not Currently  Other Topics Concern  . 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    Social Determinants of Health   Financial Resource Strain: Low Risk   . Difficulty of Paying Living Expenses: Not hard at all  Food Insecurity:   . Worried About Charity fundraiser in the Last Year: Not on file  . Ran Out of Food in the Last Year: Not on file  Transportation Needs: No Transportation Needs  . Lack of Transportation (Medical): No  . Lack of Transportation (Non-Medical): No  Physical Activity:   .  Days of Exercise per Week: Not on file  . Minutes of Exercise per Session: Not on file  Stress:   . Feeling of Stress : Not on file  Social Connections:   . Frequency of Communication with Friends and Family: Not on file  . Frequency of Social Gatherings with Friends and Family: Not on file  . Attends Religious Services: Not on file  . Active Member of Clubs or Organizations: Not on file  . Attends Archivist Meetings: Not on file  . Marital Status: Not on file    Vitals:   02/18/20 1117  BP: 128/80  Pulse: 97  Resp: 16  SpO2: 97%   Body mass index is 26.7 kg/m.  Physical Exam Vitals and nursing note reviewed.  Constitutional:  General: She is not in acute distress.    Appearance: She is well-developed.  HENT:     Head: Normocephalic and atraumatic.     Mouth/Throat:     Mouth: Mucous membranes are moist.     Pharynx: Oropharynx is clear.  Eyes:     Conjunctiva/sclera: Conjunctivae normal.  Cardiovascular:     Rate and Rhythm: Normal rate and regular rhythm.     Heart sounds: No murmur heard.   Pulmonary:     Effort: Pulmonary effort is normal. No respiratory distress.     Breath sounds: No wheezing, rhonchi or rales.  Abdominal:     Palpations: Abdomen is soft. There is no mass.     Tenderness: There is no abdominal tenderness.  Skin:    General: Skin is warm.     Findings: No erythema or rash.  Neurological:     Mental Status: She is alert and oriented to person, place, and time.     Comments: Otherwise stable gait, not assisted.   Psychiatric:        Mood and Affect: Affect normal. Mood is anxious.     Comments: Good eye contact.   ASSESSMENT AND PLAN:   Felicia Herrera was seen today for follow-up.  Orders Placed This Encounter  Procedures  . DRUG MONITORING, PANEL 8 WITH CONFIRMATION, URINE  . DM TEMPLATE    Chronic, continuous use of opioids Med contract 10/2019. [place] controlled subs report reviewed. Urine tox  today.  Constipation, unspecified constipation type Opioid could be causing or aggravating problem. Improved with Probiotics. Adequate fiber and fluid intake. OTC Miralax may also help.  Adenocarcinoma of left lung, stage 2 ([place]) Follows with oncologist.  Chronic obstructive pulmonary disease, unspecified COPD type ([place]) She feels like Memory Dance is helping. Anoro is in powder, so this could be causing cough after use. She could try Onoro again and/or call her pulmonologist and ask if it is ok to continue [place]. Some side effects of LABA's discussed.  Chronic pain disorder Problem adequately controlled. We reviewed some side effects of Percocet. No changes in current management.   Return in about 4 months (around 06/18/2020).   Kam Kushnir G. [name], MD  [facility]. [place] office.  A few things to remember from today's visit:   Chronic obstructive pulmonary disease, unspecified COPD type ([place])  Adenocarcinoma of left lung, stage 2 ([place]) - Plan: DRUG MONITORING, PANEL 8 WITH CONFIRMATION, URINE  Constipation, unspecified constipation type  Chronic, continuous use of opioids - Plan: DRUG MONITORING, PANEL 8 WITH CONFIRMATION, URINE  Try Onoro again. Powder form could causing the cough. Call your pulmonologist if still having problems. No changes today.  If you need refills please call your pharmacy. Do not use My Chart to request refills or for acute issues that need immediate attention.    Please be sure medication list is accurate. If a new problem present, please set up appointment sooner than planned today.

## 2020-02-18 NOTE — Patient Instructions (Signed)
A few things to remember from today's visit:   Chronic obstructive pulmonary disease, unspecified COPD type ([place])  Adenocarcinoma of left lung, stage 2 ([place]) - Plan: DRUG MONITORING, PANEL 8 WITH CONFIRMATION, URINE  Constipation, unspecified constipation type  Chronic, continuous use of opioids - Plan: DRUG MONITORING, PANEL 8 WITH CONFIRMATION, URINE  Try Onoro again. Powder form could causing the cough. Call your pulmonologist if still having problems. No changes today.  If you need refills please call your pharmacy. Do not use My Chart to request refills or for acute issues that need immediate attention.    Please be sure medication list is accurate. If a new problem present, please set up appointment sooner than planned today.

## 2020-02-19 ENCOUNTER — Telehealth: Payer: Self-pay | Admitting: Internal Medicine

## 2020-02-19 NOTE — Telephone Encounter (Signed)
Attempted to call pt but unable to reach. Left message for her to return call. 

## 2020-02-20 LAB — DRUG MONITORING, PANEL 8 WITH CONFIRMATION, URINE
6 Acetylmorphine: NEGATIVE ng/mL (ref <10)
Alcohol Metabolites: NEGATIVE ng/mL
Amphetamines: NEGATIVE ng/mL (ref <500)
Benzodiazepines: NEGATIVE ng/mL (ref <100)
Buprenorphine, Urine: NEGATIVE ng/mL (ref <5)
Cocaine Metabolite: NEGATIVE ng/mL (ref <150)
Codeine: NEGATIVE ng/mL (ref <50)
Creatinine: 244 mg/dL
Hydrocodone: NEGATIVE ng/mL (ref <50)
Hydromorphone: NEGATIVE ng/mL (ref <50)
MDMA: NEGATIVE ng/mL (ref <500)
Marijuana Metabolite: NEGATIVE ng/mL (ref <20)
Morphine: NEGATIVE ng/mL (ref <50)
Norhydrocodone: NEGATIVE ng/mL (ref <50)
Noroxycodone: 5851 ng/mL — ABNORMAL HIGH (ref <50)
Opiates: NEGATIVE ng/mL (ref <100)
Oxidant: NEGATIVE ug/mL
Oxycodone: 1364 ng/mL — ABNORMAL HIGH (ref <50)
Oxycodone: POSITIVE ng/mL — AB (ref <100)
Oxymorphone: 459 ng/mL — ABNORMAL HIGH (ref <50)
pH: 6.7 (ref 4.5–9.0)

## 2020-02-20 LAB — DM TEMPLATE

## 2020-02-20 NOTE — Telephone Encounter (Signed)
Attempted to call pt but unable to reach. Left message for her to return call. 

## 2020-02-20 NOTE — Telephone Encounter (Signed)
Patient is returning phone call. Patient phone number is [phone].

## 2020-02-21 ENCOUNTER — Encounter: Payer: Self-pay | Admitting: Family Medicine

## 2020-02-21 NOTE — Telephone Encounter (Signed)
Patient is returning phone call. Patient phone number is [phone].

## 2020-02-21 NOTE — Telephone Encounter (Signed)
Attempted to call pt but unable to reach and unable to leave VM due to mailbox being full. Will try to call back later. 

## 2020-02-26 ENCOUNTER — Other Ambulatory Visit: Payer: Self-pay | Admitting: Family Medicine

## 2020-02-26 DIAGNOSIS — C3492 Malignant neoplasm of unspecified part of left bronchus or lung: Secondary | ICD-10-CM

## 2020-02-26 DIAGNOSIS — G894 Chronic pain syndrome: Secondary | ICD-10-CM

## 2020-02-26 MED ORDER — OXYCODONE-ACETAMINOPHEN 5-325 MG PO TABS: 0.5000 | ORAL_TABLET | Freq: Three times a day (TID) | ORAL | 0 refills | Status: DC | PRN

## 2020-02-26 NOTE — Telephone Encounter (Signed)
lmtcb for pt.  

## 2020-02-26 NOTE — Telephone Encounter (Signed)
Pt is calling to get a refill on oxycodone-acetaminophen  (PERCOCET/ROXICET) 5-325 MG  Pharm:  [facility]

## 2020-02-27 NOTE — Telephone Encounter (Signed)
Called and spoke with pt who stated she would like to try Anoro again to see if she has the same problem with it that she had before. Pt said when previously on Anoro, she said that the only thing she can remember is that she was very lethargic while on it so Anoro was added to her allergy list as an intolerance.   Said to pt that we could switch her to a different inhaler than the Breo especially since Anoro was added to her allergy list but pt said that she would like to be placed back on the Anoro. MR, please advise.

## 2020-02-27 NOTE — Telephone Encounter (Signed)
Pt returning call.  [phone]

## 2020-02-27 NOTE — Telephone Encounter (Signed)
ATC Patient.  LM to call back. 

## 2020-02-27 NOTE — Telephone Encounter (Signed)
  Ok to re-try [facility] - once daily scheduled  Allergies  Allergen Reactions  . Anoro Ellipta [Umeclidinium-Vilanterol]

## 2020-02-28 NOTE — Telephone Encounter (Signed)
Pt is returning call - [phone]

## 2020-02-28 NOTE — Telephone Encounter (Signed)
Called and spoke with pt letting her know that MR said he was okay with her retrying anoro and she verbalized understanding. Asked pt if she needed Rx to be sent and she said she still had some from prior time Rx was sent. Nothing further needed.

## 2020-03-18 ENCOUNTER — Ambulatory Visit: Payer: Medicare Other | Attending: Internal Medicine

## 2020-03-18 DIAGNOSIS — Z23 Encounter for immunization: Secondary | ICD-10-CM

## 2020-03-18 DIAGNOSIS — R6889 Other general symptoms and signs: Secondary | ICD-10-CM | POA: Diagnosis not present

## 2020-03-18 NOTE — Progress Notes (Signed)
   Covid-19 Vaccination Clinic  Name:  Felicia Herrera    MRN: [id number] DOB: 1956/01/02  03/18/2020  Felicia Herrera was observed post Covid-19 immunization for 30 minutes based on pre-vaccination screening without incident. She was provided with Vaccine Information Sheet and instruction to access the V-Safe system.   Felicia Herrera was instructed to call 911 with any severe reactions post vaccine: Marland Kitchen Difficulty breathing  . Swelling of face and throat  . A fast heartbeat  . A bad rash all over body  . Dizziness and weakness

## 2020-03-25 ENCOUNTER — Telehealth: Payer: Self-pay | Admitting: Family Medicine

## 2020-03-25 NOTE — Telephone Encounter (Signed)
oxyCODONE-acetaminophen (PERCOCET/ROXICET) 5-325 MG tablet  Information verified

## 2020-03-25 NOTE — Telephone Encounter (Signed)
Pt is calling in stating that she needs a refill on her Oxycodone-acetaminophen (PERCOCET/ROXICET) 5-325 MG  Pharm:  [facility]

## 2020-03-25 NOTE — Telephone Encounter (Signed)
Patient has 2 rx's at the pharmacy. Tried calling pharmacy to confirm but unable to speak with anyone.

## 2020-03-26 NOTE — Telephone Encounter (Signed)
I left pt a voicemail letting her know that there are 2 rx's on file at the pharmacy, one for October and one for November. The October can be filled 10/12. November can be filled 11/12.

## 2020-04-03 ENCOUNTER — Other Ambulatory Visit: Payer: Self-pay

## 2020-04-03 ENCOUNTER — Ambulatory Visit (INDEPENDENT_AMBULATORY_CARE_PROVIDER_SITE_OTHER): Payer: Medicare Other | Admitting: *Deleted

## 2020-04-03 DIAGNOSIS — Z23 Encounter for immunization: Secondary | ICD-10-CM

## 2020-04-03 DIAGNOSIS — R6889 Other general symptoms and signs: Secondary | ICD-10-CM | POA: Diagnosis not present

## 2020-04-08 ENCOUNTER — Telehealth: Payer: Self-pay

## 2020-04-08 NOTE — Progress Notes (Signed)
I have attempted without success to contact this patient by phone three times to do her COPD Disease State call. I left a Voice message for patient to return my call.   [place] Pharmacist Assistant [phone]

## 2020-04-13 IMAGING — CT CT CHEST WITH CONTRAST
2 of 4 series · 14 of 36 positions shown, 17 images · IV contrast (OMNIPAQUE)
Comparison: 07/17/2018

CLINICAL DATA: Non-small cell lung cancer staging

EXAM:
CT CHEST WITH CONTRAST
TECHNIQUE: Multidetector CT imaging of the chest was performed during
intravenous contrast administration.
CONTRAST:  75mL OMNIPAQUE IOHEXOL 300 MG/ML  SOLN

[Series 2: axial st · axial · 0.79mm/px · z∈[-212,+54]mm · 11 of 157 slices shown, 14 images]
[im 12/157  mediastinal]
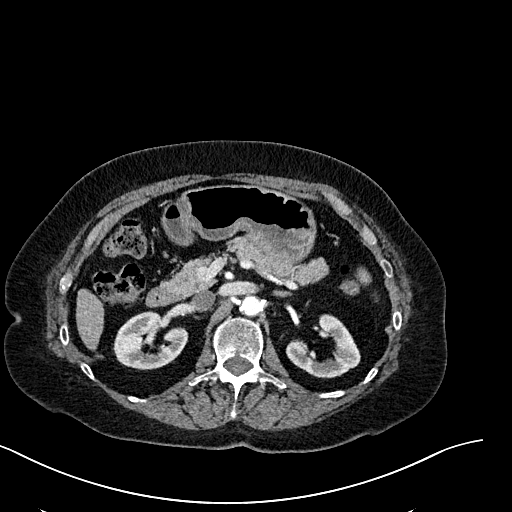
[im 12/157  lung]
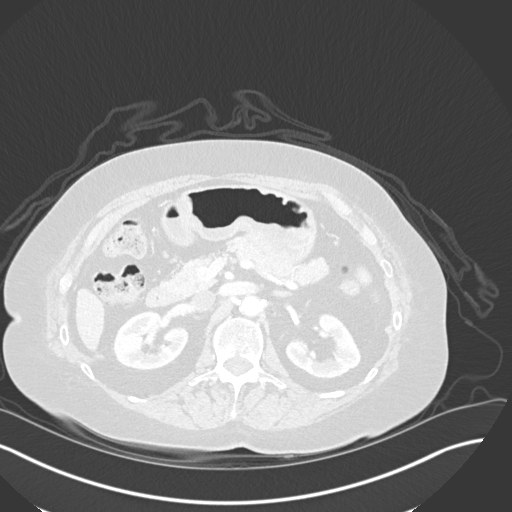
[im 23/157  lung]
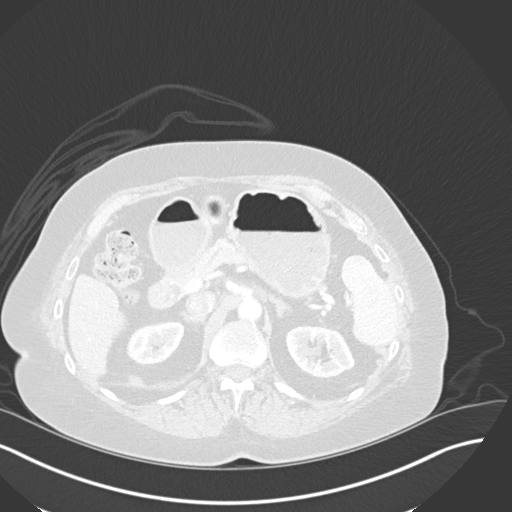
[im 34/157  lung]
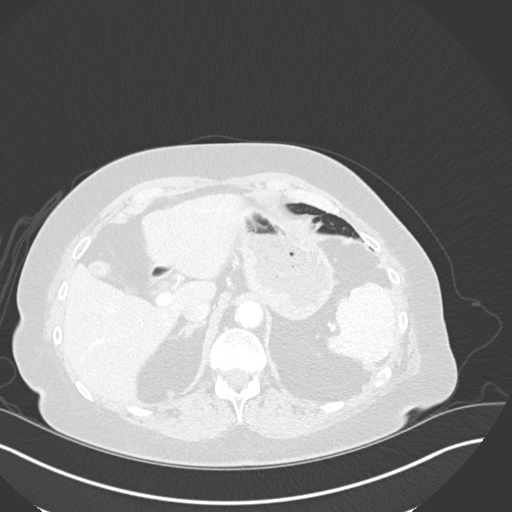
[im 56/157  lung]
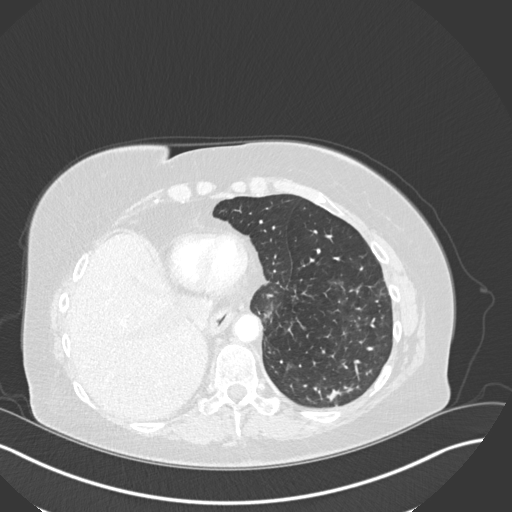
[im 67/157  mediastinal]
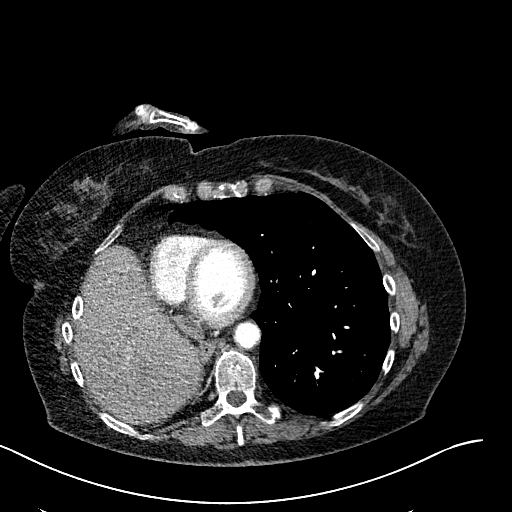
[im 67/157  lung]
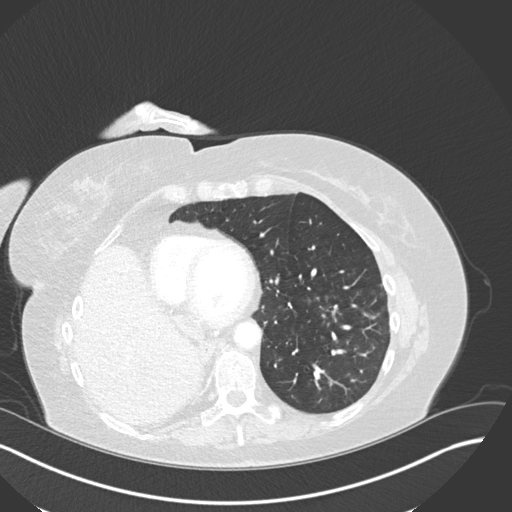
[im 79/157  lung]
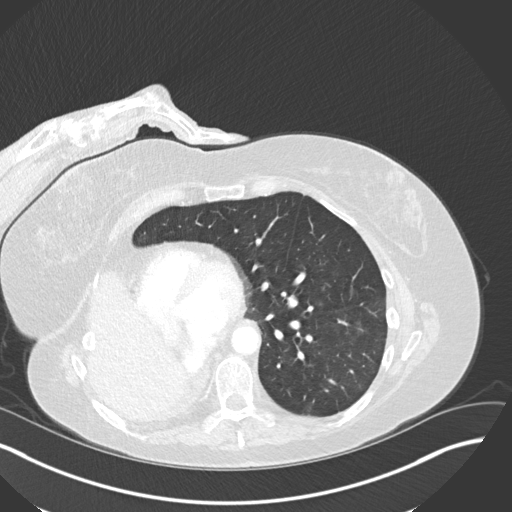
[im 90/157  lung]
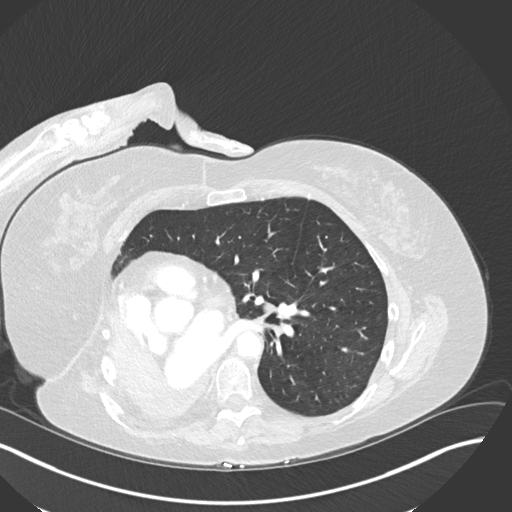
[im 101/157  lung]
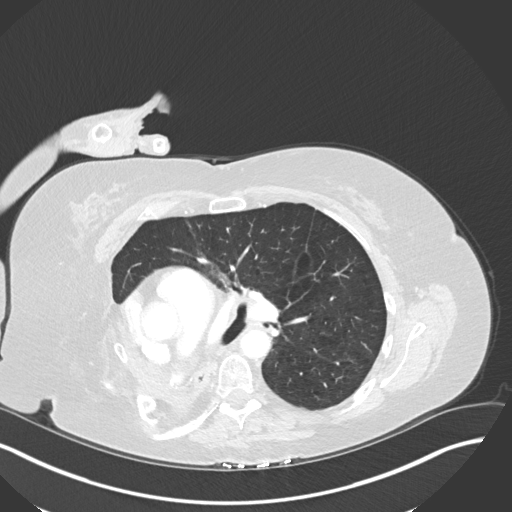
[im 123/157  mediastinal]
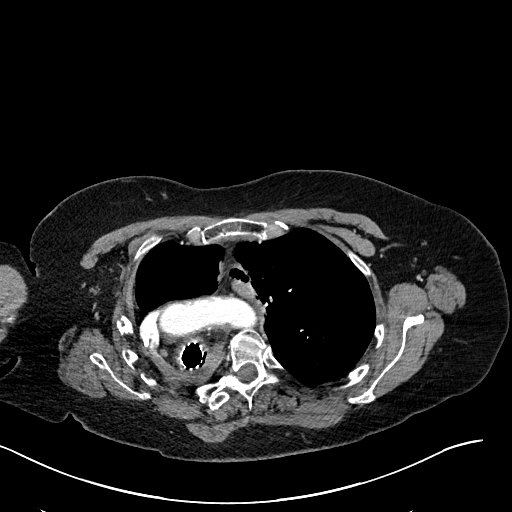
[im 123/157  lung]
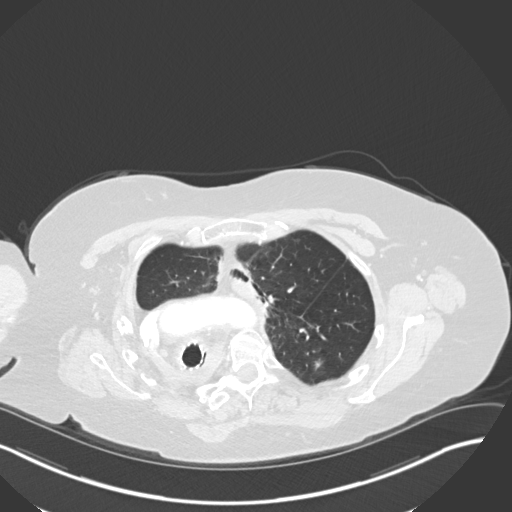
[im 134/157  lung]
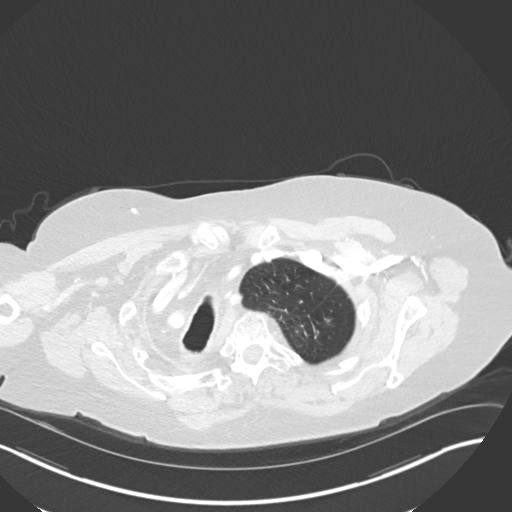
[im 145/157  lung]
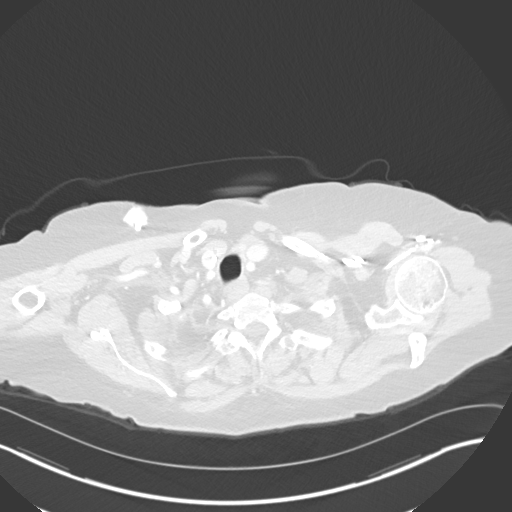

[Series 5: coronal · coronal · 0.67mm/px · 3 of 119 slices shown]
[im 24/119  lung]
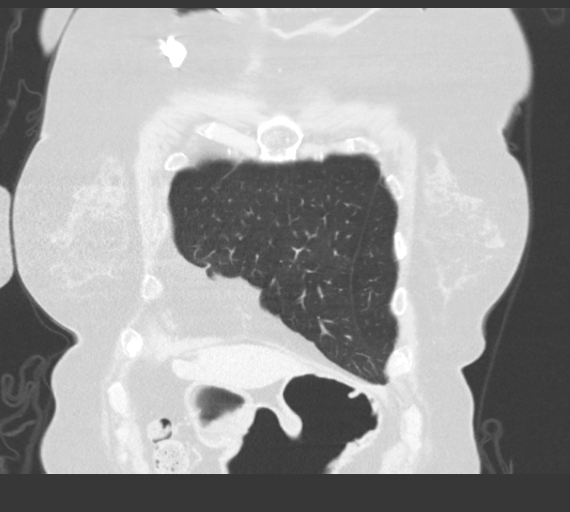
[im 48/119  lung]
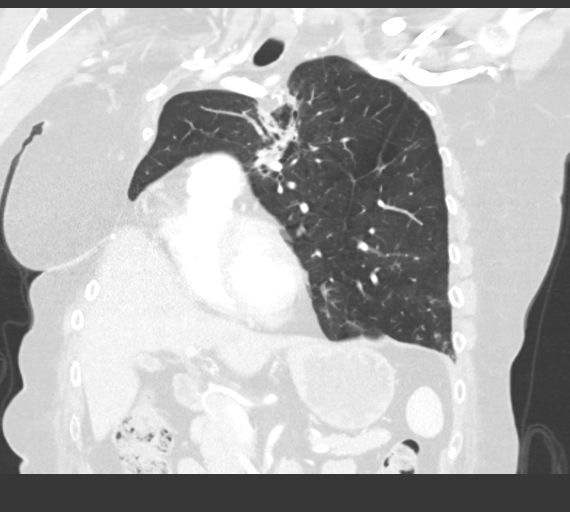
[im 71/119  lung]
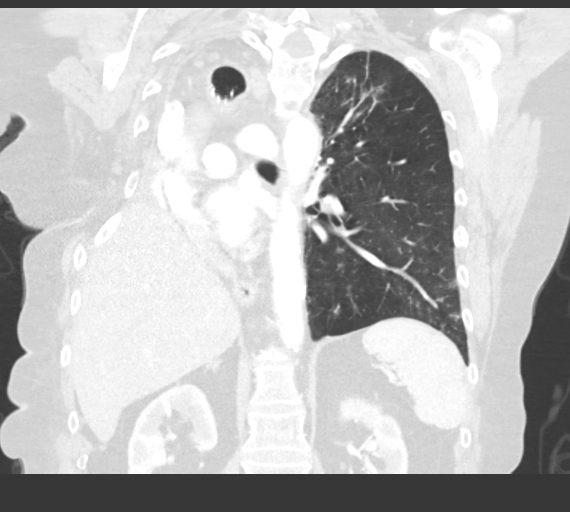

[14 of 36 positions shown; findings below may reference images not displayed]

FINDINGS: Cardiovascular: Right Port-A-Cath tip: Lower SVC

Aortic and branch vessel atherosclerotic calcifications.

Mediastinum/Nodes: Marked rightward mediastinal shift due to right
pneumonectomy. Tracheal stent extends down to the carina with patent
opening between the stent in the left mainstem bronchus. The
tracheal wall in the vicinity of the stent is somewhat thickened and
indistinct, possibly from mild chronic underlying inflammatory
process. Scattered small upper mediastinal lymph nodes are similar
to prior.

An anterior pericardial density measuring 0.9 cm in short axis on
image 108/2 is stable and probably a lymph node.

Lungs/Pleura: Left apical scarring, stable. Suprahilar airspace
opacity in the left upper lobe measuring about 1.9 by 3.6 cm on
image 40/8, essentially stable and previously attributed to prior
radiation therapy. There is some truncation of upper lobe airways
along this airspace opacity.

In the left lower lobe there are reticular and nodular opacities
which in general are improved compared to the prior exam, with less
consolidation. Some of these have a branching pattern characteristic
of atypical infectious bronchiolitis. There is potentially
underlying emphysema. There is some increase in nodularity
posteriorly on image 106/7 measuring about 1.8 by 1.2 cm, probably
from waxing and waning infiltrates in this vicinity, but meriting
surveillance.

Right pneumonectomy.

Upper Abdomen: Contracted gallbladder.

Musculoskeletal: Right thoracic deformities probably related to
prior thoracotomy and pneumonectomy. Healing fractures of the left
ninth and tenth ribs posterolaterally. The ninth rib demonstrates
increased callus formation compared to from prior, and the left
tenth rib fracture demonstrates increase in hyperemic
demineralization the fracture margins. These are most likely benign
fractures based on current and previous appearance.

Stable mild superior endplate compression at T3. Dextroconvex
thoracic scoliosis.
IMPRESSION: 1. Generally improved scattered nodular airspace opacities in the
left lower lobe compatible with improving pneumonia. There are still
some findings of atypical infectious bronchiolitis and some more
confluent mild airspace opacity in the left lower lobe, but improved
from previous. Surveillance is likely warranted.
2. Left suprahilar region of prior radiation therapy appear stable.
3. Right pneumonectomy.  Stable appearance of tracheal stent.
4. No overt pathologic adenopathy, although an anterior pericardial
node is upper normal in size.
5. Healing fractures of the left ninth and tenth ribs
posterolaterally, likely benign. Stable mild superior endplate
compression at T3.
6.  Aortic Atherosclerosis ([id number]).

## 2020-04-25 ENCOUNTER — Encounter (HOSPITAL_COMMUNITY): Payer: Self-pay

## 2020-04-25 ENCOUNTER — Other Ambulatory Visit: Payer: Medicare Other

## 2020-04-25 ENCOUNTER — Other Ambulatory Visit: Payer: Self-pay

## 2020-04-25 ENCOUNTER — Inpatient Hospital Stay: Payer: Medicare Other | Attending: Internal Medicine

## 2020-04-25 ENCOUNTER — Ambulatory Visit (HOSPITAL_COMMUNITY)
Admission: RE | Admit: 2020-04-25 | Discharge: 2020-04-25 | Disposition: A | Discharge status: Home / Self Care | Payer: Medicare Other | Source: Ambulatory Visit | Attending: Internal Medicine | Admitting: Internal Medicine

## 2020-04-25 DIAGNOSIS — Z7982 Long term (current) use of aspirin: Secondary | ICD-10-CM | POA: Insufficient documentation

## 2020-04-25 DIAGNOSIS — Z85118 Personal history of other malignant neoplasm of bronchus and lung: Secondary | ICD-10-CM | POA: Insufficient documentation

## 2020-04-25 DIAGNOSIS — J8410 Pulmonary fibrosis, unspecified: Secondary | ICD-10-CM | POA: Diagnosis not present

## 2020-04-25 DIAGNOSIS — D649 Anemia, unspecified: Secondary | ICD-10-CM | POA: Insufficient documentation

## 2020-04-25 DIAGNOSIS — I700 Atherosclerosis of aorta: Secondary | ICD-10-CM | POA: Insufficient documentation

## 2020-04-25 DIAGNOSIS — J449 Chronic obstructive pulmonary disease, unspecified: Secondary | ICD-10-CM | POA: Diagnosis not present

## 2020-04-25 DIAGNOSIS — I10 Essential (primary) hypertension: Secondary | ICD-10-CM | POA: Insufficient documentation

## 2020-04-25 DIAGNOSIS — Z8673 Personal history of transient ischemic attack (TIA), and cerebral infarction without residual deficits: Secondary | ICD-10-CM | POA: Diagnosis not present

## 2020-04-25 DIAGNOSIS — Z923 Personal history of irradiation: Secondary | ICD-10-CM | POA: Insufficient documentation

## 2020-04-25 DIAGNOSIS — Z79899 Other long term (current) drug therapy: Secondary | ICD-10-CM | POA: Insufficient documentation

## 2020-04-25 DIAGNOSIS — C3490 Malignant neoplasm of unspecified part of unspecified bronchus or lung: Secondary | ICD-10-CM | POA: Insufficient documentation

## 2020-04-25 DIAGNOSIS — E785 Hyperlipidemia, unspecified: Secondary | ICD-10-CM | POA: Insufficient documentation

## 2020-04-25 DIAGNOSIS — Z9221 Personal history of antineoplastic chemotherapy: Secondary | ICD-10-CM | POA: Insufficient documentation

## 2020-04-25 DIAGNOSIS — J439 Emphysema, unspecified: Secondary | ICD-10-CM | POA: Diagnosis not present

## 2020-04-25 LAB — CMP (CANCER CENTER ONLY)
ALT: 10 U/L (ref 0–44)
AST: 12 U/L — ABNORMAL LOW (ref 15–41)
Albumin: 3.4 g/dL — ABNORMAL LOW (ref 3.5–5.0)
Alkaline Phosphatase: 101 U/L (ref 38–126)
Anion gap: 6 (ref 5–15)
BUN: 13 mg/dL (ref 8–23)
CO2: 28 mmol/L (ref 22–32)
Calcium: 8.7 mg/dL — ABNORMAL LOW (ref 8.9–10.3)
Chloride: 105 mmol/L (ref 98–111)
Creatinine: 0.7 mg/dL (ref 0.44–1.00)
GFR, Estimated: >60 mL/min (ref >60)
Glucose, Bld: 98 mg/dL (ref 70–99)
Potassium: 3.7 mmol/L (ref 3.5–5.1)
Sodium: 139 mmol/L (ref 135–145)
Total Bilirubin: 0.2 mg/dL — ABNORMAL LOW (ref 0.3–1.2)
Total Protein: 7 g/dL (ref 6.5–8.1)

## 2020-04-25 LAB — CBC WITH DIFFERENTIAL (CANCER CENTER ONLY)
Abs Immature Granulocytes: 0.02 10*3/uL (ref 0.00–0.07)
Basophils Absolute: 0.1 10*3/uL (ref 0.0–0.1)
Basophils Relative: 1 %
Eosinophils Absolute: 0.1 10*3/uL (ref 0.0–0.5)
Eosinophils Relative: 2 %
HCT: 31.3 % — ABNORMAL LOW (ref 36.0–46.0)
Hemoglobin: 9.5 g/dL — ABNORMAL LOW (ref 12.0–15.0)
Immature Granulocytes: 0 %
Lymphocytes Relative: 18 %
Lymphs Abs: 1.1 10*3/uL (ref 0.7–4.0)
MCH: 23 pg — ABNORMAL LOW (ref 26.0–34.0)
MCHC: 30.4 g/dL (ref 30.0–36.0)
MCV: 75.8 fL — ABNORMAL LOW (ref 80.0–100.0)
Monocytes Absolute: 0.6 10*3/uL (ref 0.1–1.0)
Monocytes Relative: 10 %
Neutro Abs: 4.3 10*3/uL (ref 1.7–7.7)
Neutrophils Relative %: 69 %
Platelet Count: 437 10*3/uL — ABNORMAL HIGH (ref 150–400)
RBC: 4.13 MIL/uL (ref 3.87–5.11)
RDW: 16.4 % — ABNORMAL HIGH (ref 11.5–15.5)
WBC Count: 6.2 10*3/uL (ref 4.0–10.5)
nRBC: 0 % (ref 0.0–0.2)

## 2020-04-25 MED ORDER — HEPARIN SOD (PORK) LOCK FLUSH 100 UNIT/ML IV SOLN: 500.0000 [IU] | Freq: Once | INTRAVENOUS | Status: DC

## 2020-04-25 MED ORDER — IOHEXOL 300 MG/ML  SOLN
75.0000 mL | Freq: Once | INTRAMUSCULAR | Status: AC | PRN
  Administered 2020-04-25: 75 mL via INTRAVENOUS

## 2020-04-25 MED ORDER — HEPARIN SOD (PORK) LOCK FLUSH 100 UNIT/ML IV SOLN
INTRAVENOUS | Status: AC
  Administered 2020-04-25: 500 [IU]
  Filled 2020-04-25: qty 5

## 2020-04-28 ENCOUNTER — Inpatient Hospital Stay (HOSPITAL_BASED_OUTPATIENT_CLINIC_OR_DEPARTMENT_OTHER): Payer: Medicare Other | Admitting: Internal Medicine

## 2020-04-28 ENCOUNTER — Other Ambulatory Visit: Payer: Self-pay

## 2020-04-28 ENCOUNTER — Encounter: Payer: Self-pay | Admitting: Internal Medicine

## 2020-04-28 ENCOUNTER — Ambulatory Visit (HOSPITAL_COMMUNITY): Payer: Medicare Other

## 2020-04-28 VITALS — BP 142/68 | HR 77 | Temp 98.5°F | Resp 14 | Ht 62.0 in | Wt 144.0 lb

## 2020-04-28 DIAGNOSIS — Z5112 Encounter for antineoplastic immunotherapy: Secondary | ICD-10-CM | POA: Diagnosis not present

## 2020-04-28 DIAGNOSIS — Z85118 Personal history of other malignant neoplasm of bronchus and lung: Secondary | ICD-10-CM | POA: Diagnosis not present

## 2020-04-28 DIAGNOSIS — E785 Hyperlipidemia, unspecified: Secondary | ICD-10-CM | POA: Diagnosis not present

## 2020-04-28 DIAGNOSIS — I10 Essential (primary) hypertension: Secondary | ICD-10-CM | POA: Diagnosis not present

## 2020-04-28 DIAGNOSIS — C3490 Malignant neoplasm of unspecified part of unspecified bronchus or lung: Secondary | ICD-10-CM

## 2020-04-28 DIAGNOSIS — D649 Anemia, unspecified: Secondary | ICD-10-CM | POA: Diagnosis not present

## 2020-04-28 DIAGNOSIS — C3492 Malignant neoplasm of unspecified part of left bronchus or lung: Secondary | ICD-10-CM | POA: Diagnosis not present

## 2020-04-28 DIAGNOSIS — Z923 Personal history of irradiation: Secondary | ICD-10-CM | POA: Diagnosis not present

## 2020-04-28 DIAGNOSIS — Z7982 Long term (current) use of aspirin: Secondary | ICD-10-CM | POA: Diagnosis not present

## 2020-04-28 DIAGNOSIS — Z79899 Other long term (current) drug therapy: Secondary | ICD-10-CM | POA: Diagnosis not present

## 2020-04-28 DIAGNOSIS — I700 Atherosclerosis of aorta: Secondary | ICD-10-CM | POA: Diagnosis not present

## 2020-04-28 DIAGNOSIS — Z9221 Personal history of antineoplastic chemotherapy: Secondary | ICD-10-CM | POA: Diagnosis not present

## 2020-04-28 DIAGNOSIS — Z8673 Personal history of transient ischemic attack (TIA), and cerebral infarction without residual deficits: Secondary | ICD-10-CM | POA: Diagnosis not present

## 2020-04-28 DIAGNOSIS — J449 Chronic obstructive pulmonary disease, unspecified: Secondary | ICD-10-CM | POA: Diagnosis not present

## 2020-04-28 NOTE — Progress Notes (Signed)
.mo      [facility] [phone]   [phone]  OFFICE PROGRESS NOTE  [place], [name], MD [address] [place] [address]  DIAGNOSIS: Stage IIB (T2b, N1, M0) non-small cell lung cancer, adenocarcinoma presented with large left upper lobe lung mass in addition to left hilar lymphadenopathy diagnosed in April 2018. Her previous workup was done at [facility] in [facility]. There was insufficient material to perform the molecular studies.  GU[facility] Molecular studies: BRCA2 N358fs. Negative for EGFR, ALK, ROS1, BRAF mutations.  PRIOR THERAPY:  1) A course of concurrent chemoradiation with weekly carboplatin for AUC of 2 and paclitaxel 45 MG/M2. First dose 01/24/2017. Status post 6 cycles. 2) Consolidation immunotherapy with Imfinzi (Durvalumab) 10 mg/KG every 2 weeks status post 26 cycles.   CURRENT THERAPY: Observation.  INTERVAL HISTORY: Felicia Herrera 64 y.o. female returns to the clinic today for 6 months follow-up visit.  The patient is feeling fine today with no concerning complaints except for abdominal bloating and passing gas.  She denied having any nausea, vomiting, diarrhea or constipation.  She denied having any headache or visual changes.  She has no weight loss or night sweats.  She denied having any fever or chills.  The patient had repeat CT scan of the chest performed recently and she is here for evaluation and discussion of her risk her results.  MEDICAL HISTORY: Past Medical History:  Diagnosis Date  . Adenocarcinoma of left lung, stage 2 (HCC) 12/27/2016  . Cancer [facility])    Lun cancer: Right Dx 2008, s/p lobectomy. Left Dx 09/2016  . COPD (chronic obstructive pulmonary disease) (HCC)   . Encounter for antineoplastic chemotherapy 12/27/2016  . History of radiation therapy 01/24/17-03/08/17   left lung 2 Gy in 30 fractions  . Hyperlipidemia   . Hypertension   . Stroke [facility])     ALLERGIES:  has no  active allergies.  MEDICATIONS:  Current Outpatient Medications  Medication Sig Dispense Refill  . albuterol (VENTOLIN HFA) 108 (90 Base) MCG/ACT inhaler Inhale 2 puffs into the lungs every 6 (six) hours as needed for wheezing or shortness of breath. 6.7 Herrera 1  . amLODipine (NORVASC) 5 MG tablet TAKE ONE TABLET BY MOUTH DAILY 90 tablet 2  . aspirin EC 81 MG tablet Take 81 mg by mouth daily.    Marland Kitchen atorvastatin (LIPITOR) 40 MG tablet TAKE 1 TABLET BY MOUTH  DAILY 90 tablet 3  . betamethasone dipropionate (DIPROLENE) 0.05 % cream Apply topically daily as needed. 30 Herrera 2  . clotrimazole (LOTRIMIN) 1 % cream Apply 1 application topically 2 (two) times daily. (Patient taking differently: Apply 1 application topically 2 (two) times daily as needed. ) 30 Herrera 2  . docusate sodium (COLACE) 100 MG capsule Take 1 capsule (100 mg total) by mouth 2 (two) times daily. 30 capsule 0  . fluticasone (FLONASE) 50 MCG/ACT nasal spray Place 1 spray into both nostrils 2 (two) times daily. (Patient taking differently: Place 1 spray into both nostrils 2 (two) times daily as needed. ) 16 Herrera 6  . guaiFENesin (MUCINEX) 600 MG 12 hr tablet Take 1 tablet (600 mg total) by mouth 2 (two) times daily. 60 tablet 2  . guaiFENesin-dextromethorphan (ROBITUSSIN DM) 100-10 MG/5ML syrup Take 5 mLs by mouth every 4 (four) hours as needed for cough.    . levothyroxine (SYNTHROID) 75 MCG tablet TAKE 1 TABLET BY MOUTH  DAILY BEFORE BREAKFAST 90 tablet 3  .  nystatin cream (MYCOSTATIN) Apply 1 application topically 2 (two) times daily. (Patient taking differently: Apply 1 application topically 2 (two) times daily as needed. ) 30 Herrera 0  . omeprazole (PRILOSEC) 20 MG capsule TAKE 1 CAPSULE BY MOUTH  DAILY 90 capsule 3  . oxyCODONE-acetaminophen (PERCOCET/ROXICET) 5-325 MG tablet Take 0.5 tablets by mouth every 8 (eight) hours as needed for severe pain. 45 tablet 0  . Probiotic Product (PROBIOTIC ADVANCED PO) Take 1 capsule by mouth daily.    .  prochlorperazine (COMPAZINE) 10 MG tablet Take 1 tablet (10 mg total) by mouth every 6 (six) hours as needed for nausea or vomiting. 30 tablet 0  . Respiratory Therapy Supplies (FLUTTER) DEVI Use as directed 1 each 0   No current facility-administered medications for this visit.   Facility-Administered Medications Ordered in Other Visits  Medication Dose Route Frequency Provider Last Rate Last Admin  . sodium chloride flush (NS) 0.9 % injection 10 mL  10 mL Intracatheter PRN Curt Bears, MD   10 mL at 06/22/17 1747    SURGICAL HISTORY:  Past Surgical History:  Procedure Laterality Date  . BREAST BIOPSY     several. Denies Hx of breast cancer.  . IR FLUORO GUIDE PORT INSERTION RIGHT  02/04/2017  . IR US GUIDE VASC ACCESS RIGHT  02/04/2017  . THORACOTOMY/LOBECTOMY Right 2008  . TONSILLECTOMY  1960    REVIEW OF SYSTEMS:  A comprehensive review of systems was negative except for: Gastrointestinal: positive for Bloating   PHYSICAL EXAMINATION: General appearance: alert, cooperative, fatigued and no distress Head: Normocephalic, without obvious abnormality, atraumatic Neck: no adenopathy, no JVD, supple, symmetrical, trachea midline and thyroid not enlarged, symmetric, no tenderness/mass/nodules Lymph nodes: Cervical, supraclavicular, and axillary nodes normal. Resp: clear to auscultation bilaterally Back: symmetric, no curvature. ROM normal. No CVA tenderness. Cardio: regular rate and rhythm, S1, S2 normal, no murmur, click, rub or gallop GI: soft, non-tender; bowel sounds normal; no masses,  no organomegaly Extremities: extremities normal, atraumatic, no cyanosis or edema  ECOG PERFORMANCE STATUS: 1 - Symptomatic but completely ambulatory  Blood pressure (!) 142/68, pulse 77, temperature 98.5 F (36.9 C), temperature source Tympanic, resp. rate 14, height $RemoveBe'5\' 2"'HCzBOCWnW$  (1.575 m), weight 144 lb (65.3 kg), SpO2 98 %.  LABORATORY DATA: Lab Results  Component Value Date   WBC 6.2  04/25/2020   HGB 9.5 (L) 04/25/2020   HCT 31.3 (L) 04/25/2020   MCV 75.8 (L) 04/25/2020   PLT 437 (H) 04/25/2020      Chemistry      Component Value Date/Time   NA 139 04/25/2020 1429   NA 142 06/22/2017 1335   K 3.7 04/25/2020 1429   K 3.9 06/22/2017 1335   CL 105 04/25/2020 1429   CO2 28 04/25/2020 1429   CO2 26 06/22/2017 1335   BUN 13 04/25/2020 1429   BUN 13.6 06/22/2017 1335   CREATININE 0.70 04/25/2020 1429   CREATININE 0.7 06/22/2017 1335      Component Value Date/Time   CALCIUM 8.7 (L) 04/25/2020 1429   CALCIUM 9.4 06/22/2017 1335   ALKPHOS 101 04/25/2020 1429   ALKPHOS 93 06/22/2017 1335   AST 12 (L) 04/25/2020 1429   AST 12 06/22/2017 1335   ALT 10 04/25/2020 1429   ALT 15 06/22/2017 1335   BILITOT 0.2 (L) 04/25/2020 1429   BILITOT 0.25 06/22/2017 1335       RADIOGRAPHIC STUDIES: CT Chest W Contrast  Result Date: 04/28/2020 CLINICAL DATA:  Non-small cell lung cancer staging,  status post right pneumonectomy EXAM: CT CHEST WITH CONTRAST TECHNIQUE: Multidetector CT imaging of the chest was performed during intravenous contrast administration. CONTRAST:  51mL OMNIPAQUE IOHEXOL 300 MG/ML  SOLN COMPARISON:  10/22/2019, 04/19/2019, 03/12/2019 FINDINGS: Cardiovascular: Right chest port catheter. Aortic atherosclerosis. Normal heart size. No pericardial effusion. Mediastinum/Nodes: Rightward shift of the mediastinal contents status post right pneumonectomy. No enlarged mediastinal, hilar, or axillary lymph nodes. Redemonstrated stent in the distal trachea. Thyroid gland and esophagus demonstrate no significant findings. Lungs/Pleura: Redemonstrated postoperative findings status post right pneumonectomy. Moderate emphysema of the left lung. Unchanged post treatment appearance of the perihilar and suprahilar left lung with fibrotic consolidation and volume loss. Previously noted irregular opacities of the left lower lobe are stable or slightly improved compared to prior  examination (series 5, image 110). No pleural effusion or pneumothorax. Upper Abdomen: No acute abnormality. Musculoskeletal: No chest wall mass or suspicious bone lesions identified. IMPRESSION: 1. Redemonstrated postoperative findings status post right pneumonectomy. 2. Unchanged post treatment appearance of the perihilar and suprahilar left lung. 3. Previously noted irregular opacities of the left lower lobe are stable or slightly improved compared to prior examination, most likely infectious or inflammatory in nature. Attention on follow-up. 4. Emphysema (ICD10-J43.9). 5. Aortic Atherosclerosis (ICD10-I70.0). Electronically Signed   By: Eddie Candle M.D.   On: 04/28/2020 09:00    ASSESSMENT AND PLAN: This is a very pleasant 64 years old white female recently diagnosed with unresectable a stage IIB non-small cell lung cancer, adenocarcinoma. The patient underwent a course of concurrent chemoradiation with weekly carboplatin and paclitaxel status post 6 cycles.  She tolerated the treatment well and had partial response. She completed a course of consolidation treatment with immunotherapy with Imfinzi (Durvalumab) status post 26 cycles.  She tolerated her treatment well with no concerning adverse effects. The patient is currently on observation and she is feeling fine today with no concerning complaints except for the abdominal bloating. She had repeat CT scan of the chest that showed no concerning findings for disease progression. I recommended for her to continue on observation with repeat CT scan of the chest in 6 months. For the bloating, she will use Gas-X. For the anemia I advised her to take oral iron tablets at regular basis. For the history of COPD she will continue her routine follow-up visit and evaluation by Dr. Chase Caller. The patient voices understanding of current disease status and treatment options and is in agreement with the current care plan. All questions were answered. The patient  knows to call the clinic with any problems, questions or concerns. We can certainly see the patient much sooner if necessary.  Disclaimer: This note was dictated with voice recognition software. Similar sounding words can inadvertently be transcribed and may not be corrected upon review.

## 2020-04-29 ENCOUNTER — Telehealth: Payer: Self-pay | Admitting: Internal Medicine

## 2020-04-29 NOTE — Telephone Encounter (Signed)
Scheduled per los. Called, not able to leae msg. Mailed printout

## 2020-05-07 DIAGNOSIS — R6889 Other general symptoms and signs: Secondary | ICD-10-CM | POA: Diagnosis not present

## 2020-05-19 ENCOUNTER — Telehealth: Payer: Self-pay | Admitting: Internal Medicine

## 2020-05-19 MED ORDER — AZITHROMYCIN 250 MG PO TABS: ORAL_TABLET | ORAL | 0 refills | Status: DC

## 2020-05-19 NOTE — Telephone Encounter (Signed)
Patient is returning phone call. Patient phone number is [phone].

## 2020-05-19 NOTE — Telephone Encounter (Signed)
Attempted to call pt but unable to reach. Left message for her to return call. 

## 2020-05-19 NOTE — Telephone Encounter (Signed)
She had repeat CT chest imaging in Nov with Dr. Julien Nordmann that showed previously noted irregular opacities of the left lower lobe are stable or slightly improved compared to prior examination.   I will send in course of azithromycin for COPD exacerbation. Have her take Mucinex 600mg  twice daily. Call if not better.

## 2020-05-19 NOTE — Telephone Encounter (Signed)
Patient is aware of below recommendations and voiced her understanding. Nothing further needed.

## 2020-05-19 NOTE — Telephone Encounter (Signed)
Primary Pulmonologist: Dr. Chase Caller  Last office visit and with whom: 12/27/19 with BW What do we see them for (pulmonary problems): pneumonia , COPD stage 2 Last OV assessment/plan:   Assessment & Plan:   Left lower lobe pneumonia ([place]) - Recommend follow-up with CXR, patient declined CXR today d/t cost - Clinically she appears well and lungs were clear on exam  - She will have a repeat CT chest in November with Dr. Julien Nordmann    Stage 2 moderate COPD by GOLD classification ([place]) - Stable interval  - PFTs in 2017 showed moderate obstructive lung disease with minimal reversibility - Recommend changing ICS/LABA to LABA/LAMA d/t recurrent pneumonia  - We will due benefits investigation to see what inhaler is covered on her plan   Constipation - Complains of constipation and flatus. No abdominal pain or N/V - Recommend she decrease amount of dairy/cheese in her diet and start colace 100mg  twice daily + probiotic     Reason for call: Patient has been having a worsening cough with yellow-green mucus for the past month. Patient has not been on antibiotics only taking robitussin DM , denies fevers, is unable to sleep at night. Short of breath at times, uses albuterol 1 puff 0-2 times a day.   Patient is vaccinated booster on 03/18/20 Patient wants something for the cough and congestion.  Felicia Herrera please advise.     No Active Allergies  Immunization History  Administered Date(s) Administered   Influenza, Quadrivalent, Recombinant, Inj, Pf 03/12/2018   Influenza,inj,Quad PF,6+ Mos 03/21/2016, 02/22/2017, 03/06/2019, 04/03/2020   PFIZER SARS-COV-2 Vaccination 09/10/2019, 10/02/2019, 03/18/2020   Tdap 06/01/2019   Zoster Recombinat (Shingrix) 06/01/2019, 08/13/2019

## 2020-05-28 ENCOUNTER — Other Ambulatory Visit: Payer: Self-pay | Admitting: Family Medicine

## 2020-05-28 ENCOUNTER — Telehealth: Payer: Self-pay | Admitting: Internal Medicine

## 2020-05-28 DIAGNOSIS — C3492 Malignant neoplasm of unspecified part of left bronchus or lung: Secondary | ICD-10-CM

## 2020-05-28 DIAGNOSIS — G894 Chronic pain syndrome: Secondary | ICD-10-CM

## 2020-05-28 NOTE — Telephone Encounter (Signed)
Patient called back and stated that she wanted the medication to go to [facility] on [address], please advise. CB is [phone]

## 2020-05-28 NOTE — Telephone Encounter (Signed)
05/28/20   Contacted patient over the phone.  Was able to speak with her directly.  She was recently treated telephonically last month in November/2021 with a Z-Pak.  She reports that she feels that she is worsened since then.  She remains afebrile.  She has received her COVID-19 vaccinations see below:   Immunization History  Administered Date(s) Administered  . Influenza, Quadrivalent, Recombinant, Inj, Pf 03/12/2018  . Influenza,inj,Quad PF,6+ Mos 03/21/2016, 02/22/2017, 03/06/2019, 04/03/2020  . PFIZER SARS-COV-2 Vaccination 09/10/2019, 10/02/2019, 03/18/2020  . Tdap 06/01/2019  . Zoster Recombinat (Shingrix) 06/01/2019, 08/13/2019   When speaking to the patient over the phone she continues to report "I just do not feel better", I just do not feel well".  Informed patient that she needs to have an in person evaluation.  Patient needs at least 3 days notice to coordinate transportation to our office.  Plan: Patient is scheduled for a follow-up with EW NP on 06/02/2020, will route note to EW NP to see her thoughts.  Patient may benefit from having an x-ray prior to this office visit. Also get patient scheduled with appointment with Dr. Chase Caller has next available appointment slot in February/09/2020 Informed patient that if symptoms worsen she needs to seek emergent care and contact 911

## 2020-05-28 NOTE — Telephone Encounter (Signed)
Happy to see her Monday. Please have her bring in all medication to visit. We can get a stat CXR prior to visit (please order). No further recommendations.

## 2020-05-28 NOTE — Telephone Encounter (Signed)
Attempted to call pt but unable to reach. Left message for her to return call. 

## 2020-05-28 NOTE — Telephone Encounter (Signed)
Patient is calling and requesting a refill for oxyCODONE-acetaminophen (PERCOCET/ROXICET) 5-325 MG sent to  [facility]  [address], [place] [address]  Phone:  [phone] Fax:  [phone]  CB is [phone]

## 2020-05-29 ENCOUNTER — Other Ambulatory Visit: Payer: Self-pay | Admitting: *Deleted

## 2020-05-29 DIAGNOSIS — R059 Cough, unspecified: Secondary | ICD-10-CM

## 2020-05-29 DIAGNOSIS — J441 Chronic obstructive pulmonary disease with (acute) exacerbation: Secondary | ICD-10-CM

## 2020-05-29 MED ORDER — OXYCODONE-ACETAMINOPHEN 5-325 MG PO TABS: 0.5000 | ORAL_TABLET | Freq: Three times a day (TID) | ORAL | 0 refills | Status: DC | PRN

## 2020-05-29 NOTE — Telephone Encounter (Signed)
appt has been scheduled for pt Monday 12/13 with Beth. Placed order for chest xray. appt notes have been updated stating that pt is needing to have a cxr prior. Nothing further needed.

## 2020-06-02 ENCOUNTER — Other Ambulatory Visit: Payer: Self-pay

## 2020-06-02 ENCOUNTER — Encounter: Payer: Self-pay | Admitting: Primary Care

## 2020-06-02 ENCOUNTER — Ambulatory Visit (INDEPENDENT_AMBULATORY_CARE_PROVIDER_SITE_OTHER): Payer: Medicare Other

## 2020-06-02 ENCOUNTER — Ambulatory Visit (INDEPENDENT_AMBULATORY_CARE_PROVIDER_SITE_OTHER): Payer: Medicare Other | Admitting: Primary Care

## 2020-06-02 DIAGNOSIS — R918 Other nonspecific abnormal finding of lung field: Secondary | ICD-10-CM | POA: Diagnosis not present

## 2020-06-02 DIAGNOSIS — K219 Gastro-esophageal reflux disease without esophagitis: Secondary | ICD-10-CM

## 2020-06-02 DIAGNOSIS — R059 Cough, unspecified: Secondary | ICD-10-CM

## 2020-06-02 DIAGNOSIS — J441 Chronic obstructive pulmonary disease with (acute) exacerbation: Secondary | ICD-10-CM | POA: Diagnosis not present

## 2020-06-02 DIAGNOSIS — J9811 Atelectasis: Secondary | ICD-10-CM | POA: Diagnosis not present

## 2020-06-02 MED ORDER — CEFDINIR 300 MG PO CAPS
300.0000 mg | ORAL_CAPSULE | Freq: Two times a day (BID) | ORAL | 0 refills | Status: DC
Start: 2020-06-02 — End: 2020-07-02

## 2020-06-02 MED ORDER — PREDNISONE 10 MG PO TABS: ORAL_TABLET | ORAL | 0 refills | Status: DC

## 2020-06-02 NOTE — Assessment & Plan Note (Addendum)
-   Cough, sob and fatigue x 2 weeks - CXR today showed chronic changes, no acute process  - We will treat acute exacerbation with course of Omnicef 300mg  twice daily x 7 days and prednisone taper 40mg  x 2 days; 30mg  x 2 days; 20mg  x 2 days; 10mg  x 2 days  - Continue Anoro Ellipta 1 puff daily; Albuterol 2 puffs every 6 hours as needed for breakthrough shortness of breath  Follow-up: - 2-3 months with MR

## 2020-06-02 NOTE — Assessment & Plan Note (Addendum)
-   Waking up at night with nocturnal cough  - Recommend increasing omeprazole 20mg  to twice daily

## 2020-06-02 NOTE — Patient Instructions (Addendum)
CXR showed chronic changes, no acute process   Recommendations: - Increase omeprazole to twice daily  - Continue Anoro 1 puff daily - Continue Albuterol 2 puffs every 6 hours as needed for breakthrough shortness of breath  Rx: - Prednisone taper 40mg  x 2 days; 30mg  x 2 days; 20mg  x 2 days; 10mg  x 2 days  - Omnicef 1 tab twice daily x 7 days   Follow-up: - 2-3 months with MR OR his next available

## 2020-06-02 NOTE — Progress Notes (Signed)
@Patient  ID: Felicia Herrera, female    DOB: 1956/03/05, 64 y.o.   MRN: [id number]  Chief Complaint  Patient presents with  . Follow-up    Reports dyspnea and fatigue x2-3 weeks    Referring provider: [name], [name], MD  HPI: 64 year old female, former smoker quit in 2007 (30 pack year hx). 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 Patient of Dr. Chase Caller.  Previous [facility] pulmonary encounters: 11/15/2019 Patient presents today for 6 months follow-up. Hx stage IIB non-small cell lung cancer. Since last visit she recently had a HIDA scan with Dr. Julien Nordmann office which showed no significant evidence of disease progression. She did have a new opacity left lung suspicious for inflammatory process. Plan repeat CT chest in 6 months. Blood pressure has been elevated, she was started on Amlodipine. She feels really well on new medication.  Shortness of breath is the same. She continues to have a productive cough with yellow mucus. She uses her flutter valve on an as needed base only. Takes Robitussin at bedtime. Afebrile.  12/27/2019 Patient presents today for 6 week follow-up pneumonia left lower lobe. States that she is doing a lot better since her last visit in May. Her only complaint today is of constipation. Her LBM was 3 days ago. She has changed her diet recently, she likes to eat a lot of cheese. She has a chronic productive cough with yellow mucus which appears to be her baseline. She completed Levaquin course in early June. She is using flutter valve once a day, twice a day if able. She declined CXR today d/t cost. Denies fever, chest tightness, chest pain, abdominal pain, N/V.   06/02/2020 - Interim hx Patient presents today for acute visit. She reports increased sob, cough and fatigue x2 weeks. Her cough is productive with  yellow/green mucus. Cough is worse at night. She was treated telephonically in November 2021 with zpack which did help. She is maintained on Anoro Ellipta 1 puff daily and Albuterol 1-2 times a week. States that she goes periods of time where she doesn't sleep at all and then she will sleep 11 hours at night to make up for it.    No Active Allergies  Immunization History  Administered Date(s) Administered  . Influenza, Quadrivalent, Recombinant, Inj, Pf 03/12/2018  . Influenza,inj,Quad PF,6+ Mos 03/21/2016, 02/22/2017, 03/06/2019, 04/03/2020  . PFIZER SARS-COV-2 Vaccination 09/10/2019, 10/02/2019, 03/18/2020  . Tdap 06/01/2019  . Zoster Recombinat (Shingrix) 06/01/2019, 08/13/2019    Past Medical History:  Diagnosis Date  . Adenocarcinoma of left lung, stage 2 ([place]) 12/27/2016  . Cancer [facility])    Lun cancer: Right Dx 2008, s/p lobectomy. Left Dx 09/2016  . COPD (chronic obstructive pulmonary disease) ([place])   . Encounter for antineoplastic chemotherapy 12/27/2016  . History of radiation therapy 01/24/17-03/08/17   left lung 2 Gy in 30 fractions  . Hyperlipidemia   . Hypertension   . Stroke [facility])     Tobacco History: Social History   Tobacco Use  Smoking Status Former Smoker  . Packs/day: 1.00  . Years: 30.00  . Pack years: 30.00  . Types: Cigarettes  . Quit date: 06/21/2005  . Years since quitting: 14.9  Smokeless Tobacco Never Used   Counseling given: Not Answered   Outpatient Medications Prior to Visit  Medication Sig Dispense Refill  . albuterol (VENTOLIN HFA) 108 (90  Base) MCG/ACT inhaler Inhale 2 puffs into the lungs every 6 (six) hours as needed for wheezing or shortness of breath. 6.7 g 1  . amLODipine (NORVASC) 5 MG tablet TAKE ONE TABLET BY MOUTH DAILY 90 tablet 2  . ANORO ELLIPTA 62.5-25 MCG/INH AEPB Inhale 1 puff into the lungs daily.    Marland Kitchen aspirin EC 81 MG tablet Take 81 mg by mouth daily.    Marland Kitchen atorvastatin (LIPITOR) 40 MG tablet TAKE 1 TABLET BY MOUTH  DAILY 90  tablet 3  . azithromycin (ZITHROMAX) 250 MG tablet Zpack taper as directed 6 tablet 0  . betamethasone dipropionate (DIPROLENE) 0.05 % cream Apply topically daily as needed. 30 g 2  . clotrimazole (LOTRIMIN) 1 % cream Apply 1 application topically 2 (two) times daily. (Patient taking differently: Apply 1 application topically 2 (two) times daily as needed. ) 30 g 2  . docusate sodium (COLACE) 100 MG capsule Take 1 capsule (100 mg total) by mouth 2 (two) times daily. 30 capsule 0  . fluticasone (FLONASE) 50 MCG/ACT nasal spray Place 1 spray into both nostrils 2 (two) times daily. (Patient taking differently: Place 1 spray into both nostrils 2 (two) times daily as needed. ) 16 g 6  . guaiFENesin (MUCINEX) 600 MG 12 hr tablet Take 1 tablet (600 mg total) by mouth 2 (two) times daily. 60 tablet 2  . guaiFENesin-dextromethorphan (ROBITUSSIN DM) 100-10 MG/5ML syrup Take 5 mLs by mouth every 4 (four) hours as needed for cough.    . levothyroxine (SYNTHROID) 75 MCG tablet TAKE 1 TABLET BY MOUTH  DAILY BEFORE BREAKFAST 90 tablet 3  . nystatin cream (MYCOSTATIN) Apply 1 application topically 2 (two) times daily. (Patient taking differently: Apply 1 application topically 2 (two) times daily as needed. ) 30 g 0  . omeprazole (PRILOSEC) 20 MG capsule TAKE 1 CAPSULE BY MOUTH  DAILY 90 capsule 3  . oxyCODONE-acetaminophen (PERCOCET/ROXICET) 5-325 MG tablet Take 0.5 tablets by mouth every 8 (eight) hours as needed for severe pain. DUE FOR FOLLOW UP LATE 05/2020. 45 tablet 0  . Probiotic Product (PROBIOTIC ADVANCED PO) Take 1 capsule by mouth daily.    . prochlorperazine (COMPAZINE) 10 MG tablet Take 1 tablet (10 mg total) by mouth every 6 (six) hours as needed for nausea or vomiting. 30 tablet 0  . Respiratory Therapy Supplies (FLUTTER) DEVI Use as directed 1 each 0   Facility-Administered Medications Prior to Visit  Medication Dose Route Frequency Provider Last Rate Last Admin  . sodium chloride flush (NS) 0.9 %  injection 10 mL  10 mL Intracatheter PRN Curt Bears, MD   10 mL at 06/22/17 1747    Review of Systems  Review of Systems  Constitutional: Positive for fatigue.  Respiratory: Positive for cough and shortness of breath.    Physical Exam  BP 140/84   Pulse 80   Temp 97.7 F (36.5 C)   Ht 5\' 2"  (1.575 m)   Wt 145 lb (65.8 kg)   SpO2 98%   BMI 26.52 kg/m  Physical Exam Constitutional:      Appearance: Normal appearance.  HENT:     Head: Normocephalic and atraumatic.  Cardiovascular:     Rate and Rhythm: Normal rate and regular rhythm.  Pulmonary:     Effort: Pulmonary effort is normal.     Breath sounds: Normal breath sounds.  Neurological:     General: No focal deficit present.     Mental Status: She is alert and oriented to person,  place, and time. Mental status is at baseline.  Psychiatric:        Mood and Affect: Mood normal.        Behavior: Behavior normal.        Thought Content: Thought content normal.        Judgment: Judgment normal.      Lab Results:  CBC    Component Value Date/Time   WBC 6.2 04/25/2020 1429   WBC 8.4 03/05/2019 1237   RBC 4.13 04/25/2020 1429   HGB 9.5 (L) 04/25/2020 1429   HGB 11.5 (L) 06/22/2017 1335   HCT 31.3 (L) 04/25/2020 1429   HCT 35.5 06/22/2017 1335   PLT 437 (H) 04/25/2020 1429   PLT 332 06/22/2017 1335   MCV 75.8 (L) 04/25/2020 1429   MCV 83.5 06/22/2017 1335   MCH 23.0 (L) 04/25/2020 1429   MCHC 30.4 04/25/2020 1429   RDW 16.4 (H) 04/25/2020 1429   RDW 14.8 (H) 06/22/2017 1335   LYMPHSABS 1.1 04/25/2020 1429   LYMPHSABS 0.7 (L) 06/22/2017 1335   MONOABS 0.6 04/25/2020 1429   MONOABS 0.6 06/22/2017 1335   EOSABS 0.1 04/25/2020 1429   EOSABS 0.1 06/22/2017 1335   BASOSABS 0.1 04/25/2020 1429   BASOSABS 0.1 06/22/2017 1335    BMET    Component Value Date/Time   NA 139 04/25/2020 1429   NA 142 06/22/2017 1335   K 3.7 04/25/2020 1429   K 3.9 06/22/2017 1335   CL 105 04/25/2020 1429   CO2 28  04/25/2020 1429   CO2 26 06/22/2017 1335   GLUCOSE 98 04/25/2020 1429   GLUCOSE 100 06/22/2017 1335   BUN 13 04/25/2020 1429   BUN 13.6 06/22/2017 1335   CREATININE 0.70 04/25/2020 1429   CREATININE 0.7 06/22/2017 1335   CALCIUM 8.7 (L) 04/25/2020 1429   CALCIUM 9.4 06/22/2017 1335   GFRNONAA >60 04/25/2020 1429   GFRAA >60 10/22/2019 0943    BNP No results found for: BNP  ProBNP    Component Value Date/Time   PROBNP 249.0 (H) 03/05/2019 1237    Imaging: DG Chest 2 View  Result Date: 06/02/2020 CLINICAL DATA:  Cough.  COPD exacerbation. EXAM: CHEST - 2 VIEW COMPARISON:  Chest CT 04/25/2020 and radiographs 11/17/2018 FINDINGS: A right jugular Port-A-Cath remains in place with tip projecting in the expected region of the lower SVC. Sequelae of right pneumonectomy are again identified with rightward shift of the mediastinum. A stent remains in the distal trachea. The cardiac silhouette is unchanged. The left lung is hyperinflated with underlying emphysema. Mild left basilar opacities correspond to the small, partly nodular densities on CT where they were favored to be infectious or inflammatory. No new airspace opacity, pleural effusion, or pneumothorax is identified. Old left rib fractures are noted. IMPRESSION: Chronic changes without evidence of acute cardiopulmonary process. Electronically Signed   By: Logan Bores M.D.   On: 06/02/2020 11:04     Assessment & Plan:   COPD with acute exacerbation (HCC) - Cough, sob and fatigue x 2 weeks - CXR today showed chronic changes, no acute process  - We will treat acute exacerbation with course of Omnicef 300mg  twice daily x 7 days and prednisone taper 40mg  x 2 days; 30mg  x 2 days; 20mg  x 2 days; 10mg  x 2 days  - Continue Anoro Ellipta 1 puff daily; Albuterol 2 puffs every 6 hours as needed for breakthrough shortness of breath  Follow-up: - 2-3 months with MR   GERD (gastroesophageal reflux disease) -  Waking up at night with  nocturnal cough  - Recommend increasing omeprazole 20mg  to twice daily    Martyn Ehrich, NP 06/02/2020

## 2020-06-26 ENCOUNTER — Telehealth: Payer: Self-pay | Admitting: Family Medicine

## 2020-06-26 NOTE — Telephone Encounter (Signed)
Tried calling patient to  schedule Medicare Annual Wellness Visit (AWV) either virtually or in office  No answer/no voicemail   Last AWV no information please schedule at anytime with [facility] Nurse Health Advisor 1 or 2   This should be a 45 minute visit.

## 2020-06-30 ENCOUNTER — Other Ambulatory Visit: Payer: Self-pay | Admitting: Family Medicine

## 2020-06-30 DIAGNOSIS — C3492 Malignant neoplasm of unspecified part of left bronchus or lung: Secondary | ICD-10-CM

## 2020-06-30 DIAGNOSIS — G894 Chronic pain syndrome: Secondary | ICD-10-CM

## 2020-06-30 MED ORDER — OXYCODONE-ACETAMINOPHEN 5-325 MG PO TABS: 0.5000 | ORAL_TABLET | Freq: Three times a day (TID) | ORAL | 0 refills | Status: DC | PRN

## 2020-06-30 NOTE — Telephone Encounter (Signed)
pt needs a refill on  oxyCODONE-acetaminophen (PERCOCET/ROXICET) 5-325 MG tablet [place] [address], [place] [address]

## 2020-07-02 ENCOUNTER — Encounter: Payer: Self-pay | Admitting: Family Medicine

## 2020-07-02 ENCOUNTER — Other Ambulatory Visit: Payer: Self-pay

## 2020-07-02 ENCOUNTER — Ambulatory Visit (INDEPENDENT_AMBULATORY_CARE_PROVIDER_SITE_OTHER): Payer: Medicare Other | Admitting: Family Medicine

## 2020-07-02 VITALS — BP 120/78 | HR 101 | Resp 16 | Ht 62.0 in | Wt 146.0 lb

## 2020-07-02 DIAGNOSIS — J310 Chronic rhinitis: Secondary | ICD-10-CM

## 2020-07-02 DIAGNOSIS — J449 Chronic obstructive pulmonary disease, unspecified: Secondary | ICD-10-CM

## 2020-07-02 DIAGNOSIS — I700 Atherosclerosis of aorta: Secondary | ICD-10-CM | POA: Diagnosis not present

## 2020-07-02 DIAGNOSIS — C3492 Malignant neoplasm of unspecified part of left bronchus or lung: Secondary | ICD-10-CM | POA: Diagnosis not present

## 2020-07-02 DIAGNOSIS — E785 Hyperlipidemia, unspecified: Secondary | ICD-10-CM | POA: Diagnosis not present

## 2020-07-02 DIAGNOSIS — R5383 Other fatigue: Secondary | ICD-10-CM

## 2020-07-02 DIAGNOSIS — E039 Hypothyroidism, unspecified: Secondary | ICD-10-CM

## 2020-07-02 DIAGNOSIS — I10 Essential (primary) hypertension: Secondary | ICD-10-CM | POA: Diagnosis not present

## 2020-07-02 DIAGNOSIS — G580 Intercostal neuropathy: Secondary | ICD-10-CM

## 2020-07-02 DIAGNOSIS — J3489 Other specified disorders of nose and nasal sinuses: Secondary | ICD-10-CM

## 2020-07-02 MED ORDER — FLUTICASONE PROPIONATE 50 MCG/ACT NA SUSP: 1.0000 | Freq: Two times a day (BID) | NASAL | 3 refills | Status: AC | PRN

## 2020-07-02 MED ORDER — AZELASTINE HCL 0.1 % NA SOLN: 2.0000 | Freq: Two times a day (BID) | NASAL | 2 refills | Status: DC

## 2020-07-02 NOTE — Assessment & Plan Note (Signed)
Incidental finding on imaging. Continue atorvastatin 40 mg daily and Aspirin 81 mg daily.

## 2020-07-02 NOTE — Assessment & Plan Note (Signed)
Continue levothyroxine 75 mcg daily. Further recommendation will be given according to TSH results.

## 2020-07-02 NOTE — Assessment & Plan Note (Signed)
Problem is not well controlled. Caution advised with Benadryl. I would prefer a different antihistaminic like Zyrtec 10 mg daily or Allegra 180 mg daily. Continue Flonase nasal spray daily as needed. Astelin nasal spray added today. Nasal irrigations as needed.

## 2020-07-02 NOTE — Assessment & Plan Note (Signed)
She is not fasting today, so we will plan on checking lipid panel next visit. Continue atorvastatin 40 mg daily.

## 2020-07-02 NOTE — Assessment & Plan Note (Signed)
Symptoms seem to be getting worse. Today Long auscultation was negative for rales, wheezing, or rhonchi. Continue albuterol inhaler 2 puff every 4-6 hours as needed and Anoro Ellipta 62.5-25 mcg 1 puff daily.  She has an appointment with her pulmonologist in a few weeks.

## 2020-07-02 NOTE — Progress Notes (Signed)
Chief Complaint  Patient presents with  . Fatigue   HPI: [name] is a 65 y.o. female, who is here today with above complaint and for 4 months follow up.   She was last seen on 02/18/20.  She is coughing more and SOB is getting worse for the past couple months. She feels ike something is in her lungs again. She wakes up a few times through the night. Sleeping 12 hours per day.  No depressed mood. Fatigue and decrease appetite. She does not know about louder snoring or OSA. She has completed abx treatment x 2 since 04/2020. She doe snot fall asleep while driving,waiting in the waiting room,or watching TV.  Since her last visit she has seen her pulmonologist, 06/02/20 COPD with exacerbation.  Unresectable stage IIb lung adenocarcinoma, diagnosed in 09/2016. She underwent chemoradiation with partial response. She also completed immunotherapy, 26 cycles.  History of right lung cancer in 2008, status post lobectomy. She is following with oncologist every 6 months.  Lab Results  Component Value Date   TSH 3.76 06/01/2019   HLD: She is on Atorvastatin 40 mg daily.  Lab Results  Component Value Date   CHOL 128 06/01/2019   HDL 42.60 06/01/2019   LDLCALC 60 06/01/2019   TRIG 128.0 06/01/2019   CHOLHDL 3 06/01/2019   GERD: No nausea, vomiting,or heartburn. Bowel movement q 2-3 days. Probiotics help with constipations.  Nasal congestion and sinus pressure. Facial pain, L>R. Abx treatment x 2 started when having symptoms, have not helped. She is on Flonase nasal spray and Benadryl 25 mg as needed, mainly for sleep.  HTN: She is on Amlodipine 5 mg daily. BP mildly elevated at her oncologist visit, 142/68.  Home BP: "good." Negative for severe/frequent headache, visual changes, chest pain, dyspnea, palpitation, claudication, focal weakness, or edema. Lab Results  Component Value Date   CREATININE 0.70 04/25/2020   BUN 13 04/25/2020   NA 139 04/25/2020    K 3.7 04/25/2020   CL 105 04/25/2020   CO2 28 04/25/2020   Airtic atherosclerosis: She is on Aspirin 81 mg daily. Seen on chest CT in 03/2019.  Review of Systems  Constitutional: Positive for activity change and appetite change. Negative for fever.  HENT: Positive for congestion, postnasal drip and rhinorrhea. Negative for mouth sores, nosebleeds and sore throat.   Gastrointestinal: Negative for abdominal pain, nausea and vomiting.       Negative for changes in bowel habits.  Genitourinary: Negative for decreased urine volume and hematuria.  Skin: Negative for pallor and rash.  Allergic/Immunologic: Positive for environmental allergies.  Neurological: Negative for syncope, facial asymmetry and weakness.  Psychiatric/Behavioral: Positive for sleep disturbance. Negative for confusion.  Rest of ROS, see pertinent positives sand negatives in HPI  Current Outpatient Medications on File Prior to Visit  Medication Sig Dispense Refill  . albuterol (VENTOLIN HFA) 108 (90 Base) MCG/ACT inhaler Inhale 2 puffs into the lungs every 6 (six) hours as needed for wheezing or shortness of breath. 6.7 g 1  . amLODipine (NORVASC) 5 MG tablet TAKE ONE TABLET BY MOUTH DAILY 90 tablet 2  . ANORO ELLIPTA 62.5-25 MCG/INH AEPB Inhale 1 puff into the lungs daily.    Marland Kitchen aspirin EC 81 MG tablet Take 81 mg by mouth daily.    Marland Kitchen atorvastatin (LIPITOR) 40 MG tablet TAKE 1 TABLET BY MOUTH  DAILY 90 tablet 3  . betamethasone dipropionate (DIPROLENE) 0.05 % cream Apply topically daily as needed. 30 g 2  .  clotrimazole (LOTRIMIN) 1 % cream Apply 1 application topically 2 (two) times daily. (Patient taking differently: Apply 1 application topically 2 (two) times daily as needed.) 30 g 2  . docusate sodium (COLACE) 100 MG capsule Take 1 capsule (100 mg total) by mouth 2 (two) times daily. 30 capsule 0  . guaiFENesin (MUCINEX) 600 MG 12 hr tablet Take 1 tablet (600 mg total) by mouth 2 (two) times daily. 60 tablet 2  .  guaiFENesin-dextromethorphan (ROBITUSSIN DM) 100-10 MG/5ML syrup Take 5 mLs by mouth every 4 (four) hours as needed for cough.    . levothyroxine (SYNTHROID) 75 MCG tablet TAKE 1 TABLET BY MOUTH  DAILY BEFORE BREAKFAST 90 tablet 3  . nystatin cream (MYCOSTATIN) Apply 1 application topically 2 (two) times daily. (Patient taking differently: Apply 1 application topically 2 (two) times daily as needed.) 30 g 0  . omeprazole (PRILOSEC) 20 MG capsule TAKE 1 CAPSULE BY MOUTH  DAILY 90 capsule 3  . oxyCODONE-acetaminophen (PERCOCET/ROXICET) 5-325 MG tablet Take 0.5 tablets by mouth every 8 (eight) hours as needed for severe pain. DUE FOR FOLLOW UP LATE 05/2020. 45 tablet 0  . Probiotic Product (PROBIOTIC ADVANCED PO) Take 1 capsule by mouth daily.    . prochlorperazine (COMPAZINE) 10 MG tablet Take 1 tablet (10 mg total) by mouth every 6 (six) hours as needed for nausea or vomiting. 30 tablet 0  . Respiratory Therapy Supplies (FLUTTER) DEVI Use as directed 1 each 0   Current Facility-Administered Medications on File Prior to Visit  Medication Dose Route Frequency Provider Last Rate Last Admin  . sodium chloride flush (NS) 0.9 % injection 10 mL  10 mL Intracatheter PRN Curt Bears, MD   10 mL at 06/22/17 1747   Past Medical History:  Diagnosis Date  . Adenocarcinoma of left lung, stage 2 ([place]) 12/27/2016  . Cancer [facility])    Lun cancer: Right Dx 2008, s/p lobectomy. Left Dx 09/2016  . COPD (chronic obstructive pulmonary disease) ([place])   . Encounter for antineoplastic chemotherapy 12/27/2016  . History of radiation therapy 01/24/17-03/08/17   left lung 2 Gy in 30 fractions  . Hyperlipidemia   . Hypertension   . Stroke [facility])    No Active Allergies  Social History   Socioeconomic History  . Marital status: Single    Spouse name: Not on file  . Number of children: Not on file  . Years of education: Not on file  . Highest education level: Not on file  Occupational History  . Occupation:  Biomedical scientist  Tobacco Use  . Smoking status: Former Smoker    Packs/day: 1.00    Years: 30.00    Pack years: 30.00    Types: Cigarettes    Quit date: 06/21/2005    Years since quitting: 15.0  . Smokeless tobacco: Never Used  Vaping Use  . Vaping Use: Never used  Substance and Sexual Activity  . Alcohol use: No  . Drug use: No  . Sexual activity: Not Currently  Other Topics Concern  . 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    Social Determinants of Health   Financial Resource Strain: Low Risk   . Difficulty of Paying Living Expenses: Not hard at all  Food Insecurity: Not on file  Transportation Needs: No Transportation Needs  . Lack of Transportation (Medical): No  . Lack of Transportation (Non-Medical): No  Physical Activity: Not on file  Stress: Not on file  Social Connections:  Not on file   Vitals:   07/02/20 1438  BP: 120/78  Pulse: (!) 101  Resp: 16  SpO2: 93%   Body mass index is 26.7 kg/m.  Physical Exam Vitals and nursing note reviewed.  Constitutional:      General: She is not in acute distress.    Appearance: She is well-developed.  HENT:     Head: Normocephalic and atraumatic.     Nose: Septal deviation present.     Right Sinus: Maxillary sinus tenderness and frontal sinus tenderness present.     Left Sinus: Maxillary sinus tenderness and frontal sinus tenderness present.     Mouth/Throat:     Mouth: Oropharynx is clear and moist. Mucous membranes are dry.     Pharynx: Oropharynx is clear.  Eyes:     Conjunctiva/sclera: Conjunctivae normal.  Cardiovascular:     Rate and Rhythm: Regular rhythm. Tachycardia present.     Pulses:          Posterior tibial pulses are 2+ on the right side and 2+ on the left side.     Heart sounds: No murmur heard.   Pulmonary:     Effort: Pulmonary effort is normal. No respiratory distress.     Breath sounds: Normal breath sounds.     Comments: A few episodes of productive cough during  visit. Abdominal:     Palpations: Abdomen is soft. There is no hepatomegaly or mass.     Tenderness: There is no abdominal tenderness.  Musculoskeletal:        General: No edema.  Lymphadenopathy:     Cervical: No cervical adenopathy.  Skin:    General: Skin is warm.     Findings: No erythema or rash.  Neurological:     Mental Status: She is alert and oriented to person, place, and time.     Cranial Nerves: No cranial nerve deficit.     Gait: Gait normal.     Deep Tendon Reflexes: Strength normal.  Psychiatric:        Mood and Affect: Mood is anxious.   ASSESSMENT AND PLAN:  Ms. Jayani Rozman was seen today for follow-up.  Orders Placed This Encounter  Procedures  . CT Maxillofacial WO CM  . TSH   Lab Results  Component Value Date   TSH 0.78 07/02/2020   Fatigue, unspecified type We discussed possible etiologies: Systemic illness, immunologic,endocrinology,sleep disorder, psychiatric/psychologic, infectious,medications side effects, and idiopathic. Some of her chronic medical problem can be contributing factors. Examination today does not suggest a serious process. Healthy diet and regular physical activity may help.  Pain of maxillary sinus Persistent after abx treatment x 2. Other possible causes discussed. Maxillofacial CT will be arranged.  Rhinitis, chronic Problem is not well controlled. Caution advised with Benadryl. I would prefer a different antihistaminic like Zyrtec 10 mg daily or Allegra 180 mg daily. Continue Flonase nasal spray daily as needed. Astelin nasal spray added today. Nasal irrigations as needed.  Primary hypothyroidism Continue levothyroxine 75 mcg daily. Further recommendation will be given according to TSH results.  Intractable intercostal neuropathic pain Stable. Continue Percocet 5-325 mg 1/2 tablet 3 times daily. She is tolerating medication well, aware of side effects.  Hyperlipidemia She is not fasting today, so we will  plan on checking lipid panel next visit. Continue atorvastatin 40 mg daily.  Hypertension, essential, benign BP adequately controlled. Continue amlodipine 5 mg daily. Low-salt diet.  Atherosclerosis of aorta ([place]) Incidental finding on imaging. Continue atorvastatin 40 mg daily  and Aspirin 81 mg daily.  Adenocarcinoma of left lung, stage 2 ([place]) She is no longer on chemotherapy. Following with hematologist.  Stage 2 moderate COPD by GOLD classification ([place]) Symptoms seem to be getting worse. Today Long auscultation was negative for rales, wheezing, or rhonchi. Continue albuterol inhaler 2 puff every 4-6 hours as needed and Anoro Ellipta 62.5-25 mcg 1 puff daily.  She has an appointment with her pulmonologist in a few weeks.   Spent 45 minutes.  During this time history was obtained and documented, examination was performed, prior labs/imaging reviewed, and assessment/plan discussed.   Return in about 4 months (around 10/30/2020).   Cylis Ayars G. [name], MD  [facility]. [place] office.    A few things to remember from today's visit:   Hypertension, essential, benign  Adenocarcinoma of left lung, stage 2 ([place]), Chronic  Atherosclerosis of aorta ([place]), Chronic  Primary hypothyroidism - Plan: TSH  Hyperlipidemia, unspecified hyperlipidemia type  Intractable intercostal neuropathic pain  Fatigue, unspecified type  Rhinitis, chronic - Plan: fluticasone (FLONASE) 50 MCG/ACT nasal spray, CT Maxillofacial WO CM, azelastine (ASTELIN) 0.1 % nasal spray  Pain of maxillary sinus - Plan: CT Maxillofacial WO CM   Because facial pain is not better CT will be arranged. Keep appt with pulmonologist.  Fatigue is a common symptom associated with multiple factors: psychologic,medications, systemic illness, sleep disorders,infections, and unknown causes. Some work-up can be done to evaluate for common causes as thyroid disease,anemia,diabetes, or abnormalities in  calcium,potassium,or sodium. Regular physical activity as tolerated and a healthy diet is usually might help and usually recommended for chronic fatigue.  If you need refills please call your pharmacy. Do not use My Chart to request refills or for acute issues that need immediate attention.    Please be sure medication list is accurate. If a new problem present, please set up appointment sooner than planned today.

## 2020-07-02 NOTE — Assessment & Plan Note (Signed)
BP adequately controlled. Continue amlodipine 5 mg daily. Low-salt diet.

## 2020-07-02 NOTE — Assessment & Plan Note (Signed)
She is no longer on chemotherapy. Following with hematologist.

## 2020-07-02 NOTE — Assessment & Plan Note (Signed)
Stable. Continue Percocet 5-325 mg 1/2 tablet 3 times daily. She is tolerating medication well, aware of side effects.

## 2020-07-02 NOTE — Patient Instructions (Addendum)
A few things to remember from today's visit:   Hypertension, essential, benign  Adenocarcinoma of left lung, stage 2 ([place]), Chronic  Atherosclerosis of aorta ([place]), Chronic  Primary hypothyroidism - Plan: TSH  Hyperlipidemia, unspecified hyperlipidemia type  Intractable intercostal neuropathic pain  Fatigue, unspecified type  Rhinitis, chronic - Plan: fluticasone (FLONASE) 50 MCG/ACT nasal spray, CT Maxillofacial WO CM, azelastine (ASTELIN) 0.1 % nasal spray  Pain of maxillary sinus - Plan: CT Maxillofacial WO CM   Because facial pain is not better CT will be arranged. Keep appt with pulmonologist.  Fatigue is a common symptom associated with multiple factors: psychologic,medications, systemic illness, sleep disorders,infections, and unknown causes. Some work-up can be done to evaluate for common causes as thyroid disease,anemia,diabetes, or abnormalities in calcium,potassium,or sodium. Regular physical activity as tolerated and a healthy diet is usually might help and usually recommended for chronic fatigue.  If you need refills please call your pharmacy. Do not use My Chart to request refills or for acute issues that need immediate attention.    Please be sure medication list is accurate. If a new problem present, please set up appointment sooner than planned today.

## 2020-07-03 ENCOUNTER — Encounter: Payer: Self-pay | Admitting: Family Medicine

## 2020-07-03 LAB — TSH: TSH: 0.78 u[IU]/mL (ref 0.35–4.50)

## 2020-07-10 ENCOUNTER — Telehealth: Payer: Self-pay | Admitting: Family Medicine

## 2020-07-10 DIAGNOSIS — I10 Essential (primary) hypertension: Secondary | ICD-10-CM

## 2020-07-10 NOTE — Telephone Encounter (Signed)
Tarrin from [facility] stated due to patient being over 25 and getting a CT with contrast she needs the patient to get a CMET or a BMET as soon as possible.  Her CT is next week.

## 2020-07-11 ENCOUNTER — Telehealth: Payer: Self-pay | Admitting: Family Medicine

## 2020-07-11 NOTE — Telephone Encounter (Signed)
Orders are in

## 2020-07-11 NOTE — Telephone Encounter (Signed)
Pt needs to get labs done before her CT next week.  LVM for pt to call to schedule labs.  I called Tarrin back at [facility] CT to let her know I left a message with the pt.

## 2020-07-11 NOTE — Telephone Encounter (Signed)
Patient needs to get labs done before her CT next week.

## 2020-07-15 ENCOUNTER — Other Ambulatory Visit: Payer: Self-pay

## 2020-07-15 ENCOUNTER — Ambulatory Visit (INDEPENDENT_AMBULATORY_CARE_PROVIDER_SITE_OTHER)
Admission: RE | Admit: 2020-07-15 | Discharge: 2020-07-15 | Disposition: A | Discharge status: Home / Self Care | Payer: Medicare Other | Source: Ambulatory Visit | Attending: Family Medicine | Admitting: Family Medicine

## 2020-07-15 ENCOUNTER — Other Ambulatory Visit (INDEPENDENT_AMBULATORY_CARE_PROVIDER_SITE_OTHER): Payer: Medicare Other

## 2020-07-15 ENCOUNTER — Other Ambulatory Visit: Payer: Self-pay | Admitting: Family Medicine

## 2020-07-15 DIAGNOSIS — R519 Headache, unspecified: Secondary | ICD-10-CM | POA: Diagnosis not present

## 2020-07-15 DIAGNOSIS — J3489 Other specified disorders of nose and nasal sinuses: Secondary | ICD-10-CM | POA: Diagnosis not present

## 2020-07-15 DIAGNOSIS — J310 Chronic rhinitis: Secondary | ICD-10-CM

## 2020-07-15 DIAGNOSIS — I10 Essential (primary) hypertension: Secondary | ICD-10-CM

## 2020-07-15 LAB — COMPREHENSIVE METABOLIC PANEL
ALT: 11 U/L (ref 0–35)
AST: 16 U/L (ref 0–37)
Albumin: 4.3 g/dL (ref 3.5–5.2)
Alkaline Phosphatase: 104 U/L (ref 39–117)
BUN: 16 mg/dL (ref 6–23)
CO2: 29 mEq/L (ref 19–32)
Calcium: 9.5 mg/dL (ref 8.4–10.5)
Chloride: 101 mEq/L (ref 96–112)
Creatinine, Ser: 0.81 mg/dL (ref 0.40–1.20)
GFR: 77.38 mL/min (ref >60.00)
Glucose, Bld: 106 mg/dL — ABNORMAL HIGH (ref 70–99)
Potassium: 3.4 mEq/L — ABNORMAL LOW (ref 3.5–5.1)
Sodium: 139 mEq/L (ref 135–145)
Total Bilirubin: 0.4 mg/dL (ref 0.2–1.2)
Total Protein: 7.7 g/dL (ref 6.0–8.3)

## 2020-07-22 ENCOUNTER — Ambulatory Visit: Payer: Medicare Other

## 2020-07-25 ENCOUNTER — Encounter: Payer: Self-pay | Admitting: Internal Medicine

## 2020-07-25 ENCOUNTER — Other Ambulatory Visit: Payer: Self-pay

## 2020-07-25 ENCOUNTER — Ambulatory Visit (INDEPENDENT_AMBULATORY_CARE_PROVIDER_SITE_OTHER): Payer: Medicare Other | Admitting: Internal Medicine

## 2020-07-25 VITALS — BP 110/84 | HR 88 | Temp 97.2°F | Ht 62.0 in | Wt 144.8 lb

## 2020-07-25 DIAGNOSIS — Z902 Acquired absence of lung [part of]: Secondary | ICD-10-CM | POA: Diagnosis not present

## 2020-07-25 DIAGNOSIS — R0602 Shortness of breath: Secondary | ICD-10-CM

## 2020-07-25 DIAGNOSIS — J449 Chronic obstructive pulmonary disease, unspecified: Secondary | ICD-10-CM

## 2020-07-25 MED ORDER — SPIRIVA RESPIMAT 1.25 MCG/ACT IN AERS: 2.0000 | INHALATION_SPRAY | Freq: Every day | RESPIRATORY_TRACT | 6 refills | Status: DC

## 2020-07-25 NOTE — Patient Instructions (Signed)
ICD-10-CM   1. Chronic obstructive pulmonary disease, unspecified COPD type ([place])  J44.9   2. Status post pneumonectomy  Z90.2   3. Shortness of breath  R06.02     Glad overall you are  stable and as of November 2021 CT chest cancer is under remission To bed Anoro is causing taste problems  Plan -Stop Anoro -we will listed as intolerance in the allergy list due to taste issues -Start Spiriva Respimat 2 puffs once daily  -Any side effects or cost issues please let us know -Continue albuterol as needed   Follow-up -Return to see Dr. Chase Caller in pulmonary clinic in 6 months or sooner if needed

## 2020-07-25 NOTE — Progress Notes (Signed)
Subjective:     Patient ID: Felicia Herrera, female   DOB: 31-Aug-1955, 65 y.o.   MRN: [id number]  HPI PCP [name], [name], MD   HPI  IOV 12/09/2016  Chief Complaint  Patient presents with  . Pulmonary Consult    Pt referred by Dr. Betty [name] for malignant neoplasm of left lung. Pt denies SOB and CP/tightness.  Pt c/o dry cough.    Felicia Herrera 09/15/1955 [address] [place] [address] - 65 year old female referred by Dr. [name] for evaluation of COPD and lung cancer  At age 2 she moved from [place] where she was born to [facility] where she lived all her life. She used to smoke heavily in the past quit 11 years ago. 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 This happened in the aftermath of right lobectomy/pneumonectomy according to her history for lung cancer. After the lung cancer was in complete remission and she was leading a functional life. Then in 2018 under surveillance she's had a recurrence of lung cancer in the left lung in the area behind her left breast. Apparently this is been biopsy confirmed at [facility] in [facility]. She recollects having had CT scan of the chest and the PET scan back in the spring 2018. Apparently she i' need of chemotherapy and radiation therapy but because she was living alone in [place] she was asked to relocate to [facility] by her brother lives. She currently lives by herself and is awaiting a roommate. She uses a cane to get around. But she is otherwise functional and does not have any dysphagia or cough or dyspnea.  She denies a diagnosis of COPD although he does mention in the chart and she does admit that her Advair helps with seems somewhat contradictory. She has smoked heavily in the past      OV 02/22/2017  Chief Complaint  Patient presents with  . Follow-up    Pt has 2 weeks of chemo left  than done. Pt has daily productive cough with yellow mucus.Pt is requesting flu shot wanting to know if thats okay with chemo.     FU COPD  She has lung cancer and COPD. Recently relocated from [place]. After I saw her she was establishing oncology and is going through carboplatin and Taxol chemotherapy. She states she is stable. She is on Spiriva for her COPD. She had pulmonary function test July 2008 shows Gold stage II COPD. Because of insurance reasons a primary care switching her to [facility] which is going to start. Overall she's stable. Mild dyspnea no active issues. She wants to have flu shot.    OV 11/07/2017  Chief Complaint  Patient presents with  . Follow-up    Pt states she has been doing well since last visit. Denies any complaints or concerns.    Advanced lung cancer on immunotherapy.  Has moderate COPD.  She is status post pneumonectomy.  This is routine follow-up.  Her COPD status is stable.  She feels excellent.  COPD CAT score is 10.  She really did not make this appointment but because she ran out of Breo she made the appointment to get a refill.  She has no complaints.  She is getting her immunotherapy infusions on a scheduled basis with Dr. Julien Nordmann.  Visualization of the CT scan of the chest done in April 2019 show status post  pneumonectomy and with complete rotation of the heart to the ipsilateral side of the pneumonectomy.  She is at risk for right-to-left shunting with patent foramen ovale opening but she denies any dizziness or orthopnea proximal nocturnal dyspnea or platypnea or orthodeoxia.  We tested her saturation in different positions and they were all normal.    CAT COPD Symptom & Quality of Life Score ([place]) 0 is no burden. 5 is highest burden 11/07/2017   Never Cough -> Cough all the time 2  No phlegm in chest -> Chest is full of phlegm 2  No chest tightness -> Chest feels very tight 1  No dyspnea for 1 flight stairs/hill -> Very dyspneic for 1  flight of stairs 2  No limitations for ADL at home -> Very limited with ADL at home 1  Confident leaving home -> Not at all confident leaving home 1  Sleep soundly -> Do not sleep soundly because of lung condition 0  Lots of Energy -> No energy at all 1  TOTAL Score (max 40)  10      Position - room air pulse ox Pulse ox HR  supine 94% 87  Left side down 95% 89  Right side down 95% 85  Standing 97% 104         Results for TRANIYA, PRICHETT ANN (MRN [id number]) as of 02/22/2017 10:50  Ref. Range 01/17/2017 09:51  FEV1-Post Latest Units: L 1.25  FEV1-%Pred-Post Latest Units: % 52  FEV1-%Change-Post Latest Units: % 15   Results for WHITNEE, ORZEL ANN (MRN [id number]) as of 02/22/2017 10:50  Ref. Range 01/17/2017 09:51  DLCO unc Latest Units: ml/min/mmHg 10.43  DLCO unc % pred Latest Units: % 48   IMPRESSION: 1. Slight further regression of the left lung lesion with surrounding radiation changes. 2. Stable surgical changes from a right pneumonectomy. 3. New 8 mm pulmonary nodule at the left lung base, potential metastatic focus. Short-term followup chest CT suggested in 3 months. 4. No enlarged mediastinal/hilar lymph nodes and no evidence of upper abdominal metastatic disease. 5. Stable emphysematous changes in the left lung.  Aortic Atherosclerosis (ICD10-I70.0) and Emphysema (ICD10-J43.9).   Electronically Signed   By: Marijo Sanes M.D.   On: 09/23/2017 13:05   OV 07/25/2020  Subjective:  Patient ID: Felicia Herrera, female , DOB: 1955-09-06 , age 78 y.o. , MRN: [id number] , ADDRESS: [address] [place] [id number] PCP [name], [name], MD Patient Care Team: [name], [name], MD as PCP - General (Family Medicine) Germaine Pomfret, [facility] as Pharmacist (Pharmacist)  This Provider for this visit: Treatment Team:  Attending Provider: Brand Males, MD    07/25/2020 -   Chief Complaint  Patient presents with  . Follow-up    SOB  unchanged, Cough unchanged with yellow/ green mucus. Does not want to use anoro    Follow-up COPD in the history of lung cancer status post pneumonectomy.   HPI Felicia Herrera 65 y.o. -personally not seen her since May 2019.  After that she see nurse practitioner several times.  Sometime between July and December 2021 her [employer] got switched to [employer].  She does not know why but presumably because of insurance reasons.  She says Memory Dance is working well for her but the Anoro is causing significant taste issues.  She wants to stop Anoro even though it might be helping her.  She wants to try something else.  She is willing to try Spiriva.  But  overall she is stable.  She not had any hospitalizations.  She finished with chemo/immunotherapy in the summer 2021 according to history.  In November 2020 when she had CT scan of the chest that I personally visualized and there is no cancer recurrence there is pneumonectomy.  COPD CAT score is 18 and is somewhat worse compared to a year earlier.  She tells me that she is having sensitive teeth and she plans to see a dentist.    CAT Score 07/25/2020 11/15/2019  Total CAT Score 18 11     PFT  PFT Results Latest Ref Rng & Units 01/17/2017  FVC-Pre L 1.84  FVC-Predicted Pre % 59  FVC-Post L 2.07  FVC-Predicted Post % 67  Pre FEV1/FVC % % 59  Post FEV1/FCV % % 60  FEV1-Pre L 1.08  FEV1-Predicted Pre % 45  FEV1-Post L 1.25  DLCO uncorrected ml/min/mmHg 10.43  DLCO UNC% % 48  DLVA Predicted % 70  TLC L 4.04  TLC % Predicted % 85  RV % Predicted % 102       has a past medical history of Adenocarcinoma of left lung, stage 2 ([place]) (12/27/2016), Cancer ([place]), COPD (chronic obstructive pulmonary disease) ([place]), Encounter for antineoplastic chemotherapy (12/27/2016), History of radiation therapy (01/24/17-03/08/17), Hyperlipidemia, Hypertension, and Stroke ([place]).   reports that she quit smoking about 15 years ago. Her smoking use included cigarettes. She has a  30.00 pack-year smoking history. She has never used smokeless tobacco.  Past Surgical History:  Procedure Laterality Date  . BREAST BIOPSY     several. Denies Hx of breast cancer.  . IR FLUORO GUIDE PORT INSERTION RIGHT  02/04/2017  . IR US GUIDE VASC ACCESS RIGHT  02/04/2017  . THORACOTOMY/LOBECTOMY Right 2008  . TONSILLECTOMY  1960    No Active Allergies  Immunization History  Administered Date(s) Administered  . Influenza, Quadrivalent, Recombinant, Inj, Pf 03/12/2018  . Influenza,inj,Quad PF,6+ Mos 03/21/2016, 02/22/2017, 03/06/2019, 04/03/2020  . PFIZER(Purple Top)SARS-COV-2 Vaccination 09/10/2019, 10/02/2019, 03/18/2020  . Tdap 06/01/2019  . Zoster Recombinat (Shingrix) 06/01/2019, 08/13/2019    Family History  Problem Relation Age of Onset  . Arthritis Mother   . Lung cancer Mother 43       d.45 from cancer  . Hypertension Father   . Arthritis Father   . Pancreatic cancer Sister 60       d.60 from cancer  . Brain cancer Brother        d.~70  . Colon cancer Brother        d.~70  . Hypertension Brother   . Mental retardation Brother   . Melanoma Brother 96  . Lung cancer Maternal Aunt 66  . Colon cancer Maternal Grandfather   . Depression Neg Hx   . Heart disease Neg Hx      Current Outpatient Medications:  .  albuterol (VENTOLIN HFA) 108 (90 Base) MCG/ACT inhaler, Inhale 2 puffs into the lungs every 6 (six) hours as needed for wheezing or shortness of breath., Disp: 6.7 g, Rfl: 1 .  amLODipine (NORVASC) 5 MG tablet, TAKE ONE TABLET BY MOUTH DAILY, Disp: 90 tablet, Rfl: 2 .  ANORO ELLIPTA 62.5-25 MCG/INH AEPB, Inhale 1 puff into the lungs daily., Disp: , Rfl:  .  aspirin EC 81 MG tablet, Take 81 mg by mouth daily., Disp: , Rfl:  .  atorvastatin (LIPITOR) 40 MG tablet, TAKE 1 TABLET BY MOUTH  DAILY, Disp: 90 tablet, Rfl: 3 .  azelastine (ASTELIN) 0.1 % nasal spray,  Place 2 sprays into both nostrils 2 (two) times daily. Use in each nostril as directed, Disp: 30  mL, Rfl: 2 .  betamethasone dipropionate (DIPROLENE) 0.05 % cream, Apply topically daily as needed., Disp: 30 g, Rfl: 2 .  clotrimazole (LOTRIMIN) 1 % cream, Apply 1 application topically 2 (two) times daily. (Patient taking differently: Apply 1 application topically 2 (two) times daily as needed.), Disp: 30 g, Rfl: 2 .  docusate sodium (COLACE) 100 MG capsule, Take 1 capsule (100 mg total) by mouth 2 (two) times daily., Disp: 30 capsule, Rfl: 0 .  fluticasone (FLONASE) 50 MCG/ACT nasal spray, Place 1 spray into both nostrils 2 (two) times daily as needed., Disp: 16 g, Rfl: 3 .  guaiFENesin (MUCINEX) 600 MG 12 hr tablet, Take 1 tablet (600 mg total) by mouth 2 (two) times daily., Disp: 60 tablet, Rfl: 2 .  guaiFENesin-dextromethorphan (ROBITUSSIN DM) 100-10 MG/5ML syrup, Take 5 mLs by mouth every 4 (four) hours as needed for cough., Disp: , Rfl:  .  levothyroxine (SYNTHROID) 75 MCG tablet, TAKE 1 TABLET BY MOUTH  DAILY BEFORE BREAKFAST, Disp: 90 tablet, Rfl: 3 .  nystatin cream (MYCOSTATIN), Apply 1 application topically 2 (two) times daily. (Patient taking differently: Apply 1 application topically 2 (two) times daily as needed.), Disp: 30 g, Rfl: 0 .  omeprazole (PRILOSEC) 20 MG capsule, TAKE 1 CAPSULE BY MOUTH  DAILY, Disp: 90 capsule, Rfl: 3 .  oxyCODONE-acetaminophen (PERCOCET/ROXICET) 5-325 MG tablet, Take 0.5 tablets by mouth every 8 (eight) hours as needed for severe pain. DUE FOR FOLLOW UP LATE 05/2020., Disp: 45 tablet, Rfl: 0 .  Probiotic Product (PROBIOTIC ADVANCED PO), Take 1 capsule by mouth daily., Disp: , Rfl:  .  prochlorperazine (COMPAZINE) 10 MG tablet, Take 1 tablet (10 mg total) by mouth every 6 (six) hours as needed for nausea or vomiting., Disp: 30 tablet, Rfl: 0 .  Respiratory Therapy Supplies (FLUTTER) DEVI, Use as directed, Disp: 1 each, Rfl: 0 No current facility-administered medications for this visit.  Facility-Administered Medications Ordered in Other Visits:  .  sodium  chloride flush (NS) 0.9 % injection 10 mL, 10 mL, Intracatheter, PRN, Curt Bears, MD, 10 mL at 06/22/17 1747      Objective:   Vitals:   07/25/20 1200  BP: 110/84  Pulse: 88  Temp: (!) 97.2 F (36.2 C)  SpO2: 92%  Weight: 144 lb 12.8 oz (65.7 kg)  Height: 5\' 2"  (1.575 m)    Estimated body mass index is 26.48 kg/m as calculated from the following:   Height as of this encounter: 5\' 2"  (1.575 m).   Weight as of this encounter: 144 lb 12.8 oz (65.7 kg).  @WEIGHTCHANGE @  [employer]   07/25/20 1200  Weight: 144 lb 12.8 oz (65.7 kg)     Physical Exam   General: No distress. Looks well Neuro: Alert and Oriented x 3. GCS 15. Speech normal Psych: Pleasant Resp:  Barrel Chest - no.  Wheeze - no, Crackles - no, No overt respiratory distress CVS: Normal heart sounds. Murmurs - no Ext: Stigmata of Connective Tissue Disease - no HEENT: Normal upper airway. PEERL +. No post nasal drip        Assessment:       ICD-10-CM   1. Chronic obstructive pulmonary disease, unspecified COPD type ([place])  J44.9   2. Status post pneumonectomy  Z90.2   3. Shortness of breath  R06.02        Plan:  Patient Instructions     ICD-10-CM   1. Chronic obstructive pulmonary disease, unspecified COPD type ([place])  J44.9   2. Status post pneumonectomy  Z90.2   3. Shortness of breath  R06.02     Glad overall you are  stable and as of November 2021 CT chest cancer is under remission To bed Anoro is causing taste problems  Plan -Stop Anoro -we will listed as intolerance in the allergy list due to taste issues -Start Spiriva Respimat 2 puffs once daily  -Any side effects or cost issues please let us know -Continue albuterol as needed   Follow-up -Return to see Dr. Chase Caller in pulmonary clinic in 6 months or sooner if needed     SIGNATURE    Dr. Brand Males, M.D., F.C.C.P,  Pulmonary and Critical Care Medicine Staff Physician, [place] Director -  Interstitial Lung Disease  Program  Pulmonary [place] [facility] [place], [place], [address]  Pager: [phone], If no answer or between  15:00h - 7:00h: call [phone] Telephone: [phone]  12:33 PM 07/25/2020

## 2020-07-30 ENCOUNTER — Other Ambulatory Visit: Payer: Self-pay | Admitting: Family Medicine

## 2020-07-30 DIAGNOSIS — G894 Chronic pain syndrome: Secondary | ICD-10-CM

## 2020-07-30 DIAGNOSIS — C3492 Malignant neoplasm of unspecified part of left bronchus or lung: Secondary | ICD-10-CM

## 2020-07-30 NOTE — Telephone Encounter (Signed)
Pt call and stated she need a refill on  oxyCODONE-acetaminophen (PERCOCET/ROXICET) 5-325 MG tablet sent to  [facility], [address] Phone:  [phone]  Fax:  [phone]    And want it by Friday.

## 2020-07-30 NOTE — Telephone Encounter (Signed)
Last filled 06/30/20

## 2020-07-31 ENCOUNTER — Telehealth: Payer: Self-pay | Admitting: Pharmacist

## 2020-07-31 NOTE — Chronic Care Management (AMB) (Signed)
I left the patient a message about his/her upcoming appointment on 08/01/2020 @ 11:30 AM with the clinical pharmacist. He/She was asked to please have all medication on hand to review with the pharmacist. She did call back and the appointment was confirmed.    Maia Breslow, [facility] Assistant [phone]

## 2020-08-01 ENCOUNTER — Other Ambulatory Visit: Payer: Self-pay

## 2020-08-01 ENCOUNTER — Ambulatory Visit (INDEPENDENT_AMBULATORY_CARE_PROVIDER_SITE_OTHER): Payer: Medicare Other | Admitting: Pharmacist

## 2020-08-01 DIAGNOSIS — J310 Chronic rhinitis: Secondary | ICD-10-CM

## 2020-08-01 DIAGNOSIS — I10 Essential (primary) hypertension: Secondary | ICD-10-CM | POA: Diagnosis not present

## 2020-08-01 DIAGNOSIS — J449 Chronic obstructive pulmonary disease, unspecified: Secondary | ICD-10-CM | POA: Diagnosis not present

## 2020-08-01 MED ORDER — OXYCODONE-ACETAMINOPHEN 5-325 MG PO TABS
0.5000 | ORAL_TABLET | Freq: Three times a day (TID) | ORAL | 0 refills | Status: DC | PRN
Start: 2020-08-01 — End: 2020-08-20

## 2020-08-01 NOTE — Progress Notes (Signed)
Chronic Care Management Pharmacy Note  08/13/2020 Name:  Felicia Herrera MRN:  [id number] DOB:  [birth year]  Subjective: Felicia Herrera is an 65 y.o. year old female who is a primary patient of [name], Malka So, MD.  The CCM team was consulted for assistance with disease management and care coordination needs.    Engaged with patient face to face for follow up visit in response to provider referral for pharmacy case management and/or care coordination services.   Consent to Services:  The patient was given information about Chronic Care Management services, agreed to services, and gave verbal consent prior to initiation of services.  Please see initial visit note for detailed documentation.   Patient Care Team: [name], [name], MD as PCP - General (Family Medicine) Germaine Herrera, [facility] as Pharmacist (Pharmacist)  Recent office visits: 07/02/20 Betty [name], MD: Patient presented for fatigue and chronic conditions follow up. Prescribed azelastine nasal spray.  02/18/20  Betty [name], MD: Patient presented for pain management.  Recent consult visits: 07/25/20 Brand Males, MD (pulmonary): Patient presented for COPD follow up. Prescribed Spiriva 2 puffs daily.  06/02/20 Geraldo Pitter, NP (pulmonary): Patient presented for COPD follow up. Prescribed cefdinir and prednisone for acute exacerbation.  04/28/20 Curt Bears, MD (oncology): Patient presented for follow up. BP was elevated in office.  Hospital visits: None in previous 6 months  Objective:  Lab Results  Component Value Date   CREATININE 0.81 07/15/2020   BUN 16 07/15/2020   GFR 77.38 07/15/2020   GFRNONAA >60 04/25/2020   GFRAA >60 10/22/2019   NA 139 07/15/2020   K 3.4 (L) 07/15/2020   CALCIUM 9.5 07/15/2020   CO2 29 07/15/2020    Lab Results  Component Value Date/Time   HGBA1C 6.1 (A) 02/12/2019 05:27 PM   HGBA1C 6.0 10/18/2017 12:13 PM   GFR 77.38 07/15/2020 07:14 AM   GFR 80.21 11/04/2016  12:55 PM    Last diabetic Eye exam: No results found for: HMDIABEYEEXA  Last diabetic Foot exam: No results found for: HMDIABFOOTEX   Lab Results  Component Value Date   CHOL 128 06/01/2019   HDL 42.60 06/01/2019   LDLCALC 60 06/01/2019   TRIG 128.0 06/01/2019   CHOLHDL 3 06/01/2019    Hepatic Function Latest Ref Rng & Units 07/15/2020 04/25/2020 10/22/2019  Total Protein 6.0 - 8.3 Herrera/dL 7.7 7.0 7.8  Albumin 3.5 - 5.2 Herrera/dL 4.3 3.4(L) 3.6  AST 0 - 37 U/L 16 12(L) 12(L)  ALT 0 - 35 U/L $Remo'11 10 11  'CXEag$ Alk Phosphatase 39 - 117 U/L 104 101 120  Total Bilirubin 0.2 - 1.2 mg/dL 0.4 0.2(L) 0.5    Lab Results  Component Value Date/Time   TSH 0.78 07/02/2020 03:37 PM   TSH 3.76 06/01/2019 07:35 AM    CBC Latest Ref Rng & Units 04/25/2020 10/22/2019 04/19/2019  WBC 4.0 - 10.5 K/uL 6.2 6.8 11.4(H)  Hemoglobin 12.0 - 15.0 Herrera/dL 9.5(L) 11.8(L) 10.3(L)  Hematocrit 36.0 - 46.0 % 31.3(L) 39.3 33.4(L)  Platelets 150 - 400 K/uL 437(H) 347 476(H)    No results found for: VD25OH  Clinical ASCVD: No  The ASCVD Risk score Mikey Bussing DC Jr., et al., 2013) failed to calculate for the following reasons:   The valid total cholesterol range is 130 to 320 mg/dL    Depression screen [facility] 2/9 02/18/2020 07/13/2017 04/04/2017  Decreased Interest 0 0 0  Down, Depressed, Hopeless 0 0 0  PHQ - 2 Score 0 0 0  Some recent data might be hidden     Social History   Tobacco Use  Smoking Status Former Smoker  . Packs/day: 1.00  . Years: 30.00  . Pack years: 30.00  . Types: Cigarettes  . Quit date: 06/21/2005  . Years since quitting: 15.1  Smokeless Tobacco Never Used   BP Readings from Last 3 Encounters:  07/25/20 110/84  07/02/20 120/78  06/02/20 140/84   Pulse Readings from Last 3 Encounters:  07/25/20 88  07/02/20 (!) 101  06/02/20 80   Wt Readings from Last 3 Encounters:  07/25/20 144 lb 12.8 oz (65.7 kg)  07/02/20 146 lb (66.2 kg)  06/02/20 145 lb (65.8 kg)    Assessment/Interventions: Review of  patient past medical history, allergies, medications, health status, including review of consultants reports, laboratory and other test data, was performed as part of comprehensive evaluation and provision of chronic care management services.   SDOH:  (Social Determinants of Health) assessments and interventions performed: No   CCM Care Plan  Allergies  Allergen Reactions  . Anoro Ellipta [Umeclidinium-Vilanterol]     Taste issues    Medications Reviewed Today    Reviewed by [name], [name], MD (Physician) on 07/02/20 at [place] List Status: <None>  Medication Order Taking? Sig Documenting Provider Last Dose Status Informant  albuterol (VENTOLIN HFA) 108 (90 Base) MCG/ACT inhaler [id number] Yes Inhale 2 puffs into the lungs every 6 (six) hours as needed for wheezing or shortness of breath. Martyn Ehrich, NP Taking Active   amLODipine (NORVASC) 5 MG tablet [id number] Yes TAKE ONE TABLET BY MOUTH DAILY [name], [name], MD Taking Active   [facility] ELLIPTA 62.5-25 MCG/INH AEPB [id number] Yes Inhale 1 puff into the lungs daily. [provider] Taking Active   aspirin EC 81 MG tablet [id number] Yes Take 81 mg by mouth daily. [provider] Taking Active   atorvastatin (LIPITOR) 40 MG tablet [id number] Yes TAKE 1 TABLET BY MOUTH  DAILY [name], [name], MD Taking Active   azelastine (ASTELIN) 0.1 % nasal spray [id number] Yes Place 2 sprays into both nostrils 2 (two) times daily. Use in each nostril as directed [name], [name], MD  Active   betamethasone dipropionate (DIPROLENE) 0.05 % cream [id number] Yes Apply topically daily as needed. [name], [name], MD Taking Active   clotrimazole (LOTRIMIN) 1 % cream [id number] Yes Apply 1 application topically 2 (two) times daily.  Patient taking differently: Apply 1 application topically 2 (two) times daily as needed.   [name], [name], MD Taking Active   docusate sodium (COLACE) 100 MG capsule [id number] Yes Take 1 capsule (100 mg total) by  mouth 2 (two) times daily. Martyn Ehrich, NP Taking Active   fluticasone [facility]) 50 MCG/ACT nasal spray [id number]  Place 1 spray into both nostrils 2 (two) times daily as needed. [name], [name], MD  Active   guaiFENesin (MUCINEX) 600 MG 12 hr tablet [id number] Yes Take 1 tablet (600 mg total) by mouth 2 (two) times daily. Martyn Ehrich, NP Taking Active   guaiFENesin-dextromethorphan [facility] DM) 100-10 MG/5ML syrup [id number] Yes Take 5 mLs by mouth every 4 (four) hours as needed for cough. [provider] Taking Active   levothyroxine (SYNTHROID) 75 MCG tablet [id number] Yes TAKE 1 TABLET BY MOUTH  DAILY BEFORE BREAKFAST [name], [name], MD Taking Active   nystatin cream (MYCOSTATIN) [id number] Yes Apply 1 application topically 2 (two) times daily.  Patient taking differently: Apply 1 application topically 2 (two)  times daily as needed.   Maryanna Shape, NP Taking Active   omeprazole (PRILOSEC) 20 MG capsule [id number] Yes TAKE 1 CAPSULE BY MOUTH  DAILY [name], [name], MD Taking Active   oxyCODONE-acetaminophen (PERCOCET/ROXICET) 5-325 MG tablet [id number] Yes Take 0.5 tablets by mouth every 8 (eight) hours as needed for severe pain. DUE FOR FOLLOW UP LATE 05/2020. [name], [name], MD Taking Active   Probiotic Product (PROBIOTIC ADVANCED PO) [id number] Yes Take 1 capsule by mouth daily. [provider] Taking Active   prochlorperazine (COMPAZINE) 10 MG tablet [id number] Yes Take 1 tablet (10 mg total) by mouth every 6 (six) hours as needed for nausea or vomiting. Curt Bears, MD Taking Active            Med Note Berle Mull Mar 07, 2017 12:49 PM) As needed  Respiratory Therapy Supplies [place]) DEVI [id number] Yes Use as directed Martyn Ehrich, NP Taking Active   sodium chloride flush (NS) 0.9 % injection 10 mL [id number]   Curt Bears, MD  Active           Patient Active Problem List   Diagnosis Date Noted  . Constipation 12/27/2019   . Atherosclerosis of aorta ([place]) 06/01/2019  . Left lower lobe pneumonia 07/19/2018  . Chronic pain disorder 05/15/2018  . Intertrigo 05/15/2018  . Hyperlipidemia 10/18/2017  . Rhinitis, chronic 10/18/2017  . Port-A-Cath in place 05/25/2017  . Encounter for antineoplastic immunotherapy 05/10/2017  . Intractable intercostal neuropathic pain 05/10/2017  . Genetic testing 03/09/2017  . 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 Stage 2 moderate COPD by GOLD classification ([place]) 02/22/2017  . Adenocarcinoma of left lung, stage 2 ([place]) 12/27/2016  . Encounter for antineoplastic chemotherapy 12/27/2016  . Goals of care, counseling/discussion 12/27/2016  . GERD (gastroesophageal reflux disease) 12/17/2016  . Hypertension, essential, benign 12/17/2016  . History of COPD 12/09/2016  . History of lung cancer 12/09/2016  . Lung cancer ([place]) 11/04/2016  . Primary hypothyroidism 11/04/2016    Immunization History  Administered Date(s) Administered  . Influenza, Quadrivalent, Recombinant, Inj, Pf 03/12/2018  . Influenza,inj,Quad PF,6+ Mos 03/21/2016, 02/22/2017, 03/06/2019, 04/03/2020  . PFIZER(Purple Top)SARS-COV-2 Vaccination 09/10/2019, 10/02/2019, 03/18/2020  . Tdap 06/01/2019  . Zoster Recombinat (Shingrix) 06/01/2019, 08/13/2019    Conditions to be addressed/monitored:  Hypertension, Coronary Artery Disease, COPD, Hypothyroidism and lung cancer  Care Plan : CCM Pharmacy Care Plan  Updates made by Viona Gilmore, [place] since 08/13/2020 12:00 AM    Problem: Problem: Hypertension, Coronary Artery Disease, COPD, Hypothyroidism and lung cancer     Long-Range Goal: Patient-Specific Goal   Start Date: 08/01/2020  Expected End Date: 08/01/2021  This Visit's Progress: On track  Priority: High  Note:   Current Barriers:  . Unable to independently monitor therapeutic efficacy . Unable to self administer medications as prescribed  Pharmacist Clinical Goal(s):  Marland Kitchen Over the  next 90 days, patient will achieve adherence to monitoring guidelines and medication adherence to achieve therapeutic efficacy . achieve control of blood pressure as evidenced by home blood pressure monitoring  through collaboration with PharmD and provider.   Interventions: . 1:1 collaboration with [name], [name], MD regarding development and update of comprehensive plan of care as evidenced by provider attestation and co-signature . Inter-disciplinary care team collaboration (see longitudinal plan of care) . Comprehensive medication review performed; medication list updated in electronic medical record  Hypertension (BP goal <130/80) -Controlled -Current treatment: . Amlodipine 5 mg 1/2 tablet daily -  Medications previously tried: none  -Current home readings: could not provide -Current dietary habits: did not discuss -Current exercise habits: did not discuss -Denies hypotensive/hypertensive symptoms -Educated on Importance of home blood pressure monitoring; Proper BP monitoring technique; -Counseled to monitor BP at home weekly, document, and provide log at future appointments -Recommended to continue current medication  Hyperlipidemia/CAD: (LDL goal < 70) -Controlled -Current treatment:  Aspirin 81 mg daily  Atorvastatin 40 mg daily -Medications previously tried: none  -Current dietary patterns: did not discuss -Current exercise habits: did not discuss -Educated on Cholesterol goals;  -Recommended to continue current medication  COPD (Goal: control symptoms and prevent exacerbations) -Uncontrolled -Current treatment   Ventolin HFA 2 puffs every 6 hours as needed   Spiriva respimat 1.25 mcg/act 2 puffs daily -Medications previously tried: Firefighter (recurrent pneumonia), Anoro (left a weird taste in her mouth) -Gold Grade: Gold 2 (FEV1 50-79%) -Current COPD Classification:  B (high sx, <2 exacerbations/yr) -CAT score: 18 -Pulmonary function testing: 01/17/17 -Exacerbations  requiring treatment in last 6 months: yes -Patient denies consistent use of maintenance inhaler -Frequency of rescue inhaler use: daily -Counseled on Proper inhaler technique; Benefits of consistent maintenance inhaler use Differences between maintenance and rescue inhalers -Recommended to continue current medication Plan to follow up in 1 month to see how patient is handling using inhaler  Hypothyroidism (Goal: TSH 0.35-4.5) -Controlled -Current treatment  . Levothyroxine 75 mcg daily  -Medications previously tried: none  -Recommended to continue current medication  Allergic rhinitis (Goal: minimize symptoms of allergies) -Uncontrolled -Current treatment  . Flonase 50 mcg/act 1 spray twice daily  - not taking . Azelastine 0.1% nasal spray 2 sprays in both nostrils twice daily . Saline nasal spray as needed . Mucinex DM syrup as needed . Benadryl as needed for insect bites -Medications previously tried: several -Recommended mucinex without DM to try for congestion and if this does not help, try Zyrtec or Claritin over the counter Educated on the differences between the nasal sprays and various over the counter cold and cough products  GERD (Goal: minimize symptoms of heartburn/acid reflux) -Controlled -Current treatment  . Omeprazole 20 mg daily -Medications previously tried: none  -Recommended to continue current medication  Pain (Goal: minimize symptoms of pain) -Controlled -Current treatment  . Oxycodone 5-325 mg 1/2 tablet every 6 hours as needed -Medications previously tried: none  -Recommended to continue current medication Counseled on limiting use    Health Maintenance -Vaccine gaps: none -Current therapy:   Betamethasone 0.05% cream   Clotrimazole 1% cream   Docusate 100 mg BID (stopped diarrhea)   Nystatin cream  Prochlorperazine 10 mg q6hr  Probiotic 1 tablet daily -Educated on Supplements may interfere with prescription drugs -Patient is  satisfied with current therapy and denies issues -Recommended to continue current medication  Patient Goals/Self-Care Activities . Over the next 90 days, patient will:  - take medications as prescribed check blood pressure weekly, document, and provide at future appointments  Follow Up Plan: Face to Face appointment with care management team member scheduled for:  3 months        Medication Assistance: None required.  Patient affirms current coverage meets needs.  Patient's preferred pharmacy is:  [facility] [address], [place] - [address] [place] [place] [address] Phone: [phone] Fax: [phone]  Uses pill box? Yes Pt endorses 90% compliance - except with Spiriva  We discussed: Current pharmacy is preferred with insurance plan and patient is satisfied with pharmacy services Patient  decided to: Continue current medication management strategy  Care Plan and Follow Up Patient Decision:  Patient agrees to Care Plan and Follow-up.  Plan: Face to Face appointment with care management team member scheduled for: 3 months  Jeni Salles, PharmD [place] Pharmacist [place] at [place] [phone]

## 2020-08-04 ENCOUNTER — Telehealth: Payer: Self-pay | Admitting: Internal Medicine

## 2020-08-04 NOTE — Telephone Encounter (Signed)
Patient is returning phone call. Patient phone number is [phone].

## 2020-08-04 NOTE — Telephone Encounter (Signed)
MR, please advise on any recommendations for a dentist for pt as I do not see any documentation on this in last OV with you.

## 2020-08-04 NOTE — Telephone Encounter (Signed)
Reviewed last OV note. MR did not mention any dentists by name.   Left message for patient to call back.

## 2020-08-05 NOTE — Telephone Encounter (Signed)
I go to Dr Alfredo Martinez of [facility]. Really good but she should probably asked her pcp [name], [name], MD for overall recs or talk to her friends as well

## 2020-08-05 NOTE — Telephone Encounter (Signed)
LMTCB

## 2020-08-05 NOTE — Telephone Encounter (Signed)
I have called the pt back at the number given.  The phone only keeps ringing and doesn't give me the option to leave a VM.  Will try back later.

## 2020-08-05 NOTE — Telephone Encounter (Signed)
Patient is returning phone call. Patient phone number is [phone].

## 2020-08-06 NOTE — Telephone Encounter (Signed)
Call made to patient confirmed DOB. Made aware of MR recommendations. Voiced understanding.   Nothing further is needed at this time.

## 2020-08-08 ENCOUNTER — Telehealth: Payer: Self-pay | Admitting: Pharmacist

## 2020-08-08 NOTE — Chronic Care Management (AMB) (Signed)
Chronic Care Management Pharmacy Assistant   Name: Felicia Herrera  MRN: [id number] DOB: 1956/05/04  Reason for Encounter: General Adherence call  PCP : [name], [name], MD  Allergies:   Allergies  Allergen Reactions  . Anoro Ellipta [Umeclidinium-Vilanterol]     Taste issues    Medications: Outpatient Encounter Medications as of 08/08/2020  Medication Sig Note  . amLODipine (NORVASC) 5 MG tablet TAKE ONE TABLET BY MOUTH DAILY   . aspirin EC 81 MG tablet Take 81 mg by mouth daily.   Marland Kitchen atorvastatin (LIPITOR) 40 MG tablet TAKE 1 TABLET BY MOUTH  DAILY   . azelastine (ASTELIN) 0.1 % nasal spray Place 2 sprays into both nostrils 2 (two) times daily. Use in each nostril as directed   . betamethasone dipropionate (DIPROLENE) 0.05 % cream Apply topically daily as needed.   . clotrimazole (LOTRIMIN) 1 % cream Apply 1 application topically 2 (two) times daily. (Patient taking differently: Apply 1 application topically 2 (two) times daily as needed.)   . docusate sodium (COLACE) 100 MG capsule Take 1 capsule (100 mg total) by mouth 2 (two) times daily.   . fluticasone (FLONASE) 50 MCG/ACT nasal spray Place 1 spray into both nostrils 2 (two) times daily as needed.   Marland Kitchen guaiFENesin (MUCINEX) 600 MG 12 hr tablet Take 1 tablet (600 mg total) by mouth 2 (two) times daily.   Marland Kitchen guaiFENesin-dextromethorphan (ROBITUSSIN DM) 100-10 MG/5ML syrup Take 5 mLs by mouth every 4 (four) hours as needed for cough.   . levothyroxine (SYNTHROID) 75 MCG tablet TAKE 1 TABLET BY MOUTH  DAILY BEFORE BREAKFAST   . nystatin cream (MYCOSTATIN) Apply 1 application topically 2 (two) times daily. (Patient taking differently: Apply 1 application topically 2 (two) times daily as needed.)   . omeprazole (PRILOSEC) 20 MG capsule TAKE 1 CAPSULE BY MOUTH  DAILY   . oxyCODONE-acetaminophen (PERCOCET/ROXICET) 5-325 MG tablet Take 0.5 tablets by mouth every 8 (eight) hours as needed for severe pain. DUE FOR FOLLOW UP LATE  05/2020.   . Probiotic Product (PROBIOTIC ADVANCED PO) Take 1 capsule by mouth daily.   . prochlorperazine (COMPAZINE) 10 MG tablet Take 1 tablet (10 mg total) by mouth every 6 (six) hours as needed for nausea or vomiting. 03/07/2017: As needed  . Respiratory Therapy Supplies (FLUTTER) DEVI Use as directed   . Tiotropium Bromide Monohydrate (SPIRIVA RESPIMAT) 1.25 MCG/ACT AERS Inhale 2 puffs into the lungs daily.    Facility-Administered Encounter Medications as of 08/08/2020  Medication  . sodium chloride flush (NS) 0.9 % injection 10 mL    Current Diagnosis: Patient Active Problem List   Diagnosis Date Noted  . Constipation 12/27/2019  . Atherosclerosis of aorta ([place]) 06/01/2019  . Left lower lobe pneumonia 07/19/2018  . Chronic pain disorder 05/15/2018  . Intertrigo 05/15/2018  . Hyperlipidemia 10/18/2017  . Rhinitis, chronic 10/18/2017  . Port-A-Cath in place 05/25/2017  . Encounter for antineoplastic immunotherapy 05/10/2017  . Intractable intercostal neuropathic pain 05/10/2017  . Genetic testing 03/09/2017  . 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 Stage 2 moderate COPD by GOLD classification ([place]) 02/22/2017  . Adenocarcinoma of left lung, stage 2 ([place]) 12/27/2016  . Encounter for antineoplastic chemotherapy 12/27/2016  . Goals of care, counseling/discussion 12/27/2016  . GERD (gastroesophageal reflux disease) 12/17/2016  . Hypertension, essential, benign 12/17/2016  . History of COPD 12/09/2016  . History of lung cancer 12/09/2016  . Lung cancer ([place]) 11/04/2016  . Primary hypothyroidism 11/04/2016  Goals Addressed   None     Follow-Up:  Pharmacist Review  I did a follow-up call with the patient. She was having some issues with congestion. She has been using Mucinex and azelastine nasal spray to help with this issue. She states that she feels a little better, but she is still about the same. She will use the rest throughout the weekend but Monday  she will be going to her pharmacy and getting some Zyrtec or Claritin. She says if she has any new issues or lingering problems she will contact her primary care physician.  Maia Breslow, [place] Assistant [phone]

## 2020-08-13 NOTE — Patient Instructions (Addendum)
Hi Lexys,  It was great to get to meet you in person! I hope you feel better soon! As we discussed, try using the azelastine nasal spray along with the Mucinex to see if that helps. If no improvement in a week, try Zyrtec or Claritin over the counter to see if allergies are the culprit.  Please reach out to me if you are having any more trouble with the Spiriva and don't forget to bring your BP machine at your follow up!  Best, Maddie  Jeni Salles, PharmD [facility] Clinical Pharmacist [facility]   Visit Information  Goals Addressed   None    Patient Care Plan: CCM Pharmacy Care Plan    Problem Identified: Problem: Hypertension, Coronary Artery Disease, COPD, Hypothyroidism and lung cancer     Long-Range Goal: Patient-Specific Goal   Start Date: 08/01/2020  Expected End Date: 08/01/2021  This Visit's Progress: On track  Priority: High  Note:   Current Barriers:  . Unable to independently monitor therapeutic efficacy . Unable to self administer medications as prescribed  Pharmacist Clinical Goal(s):  Marland Kitchen Over the next 90 days, patient will achieve adherence to monitoring guidelines and medication adherence to achieve therapeutic efficacy . achieve control of blood pressure as evidenced by home blood pressure monitoring  through collaboration with PharmD and provider.   Interventions: . 1:1 collaboration with [name], [name], MD regarding development and update of comprehensive plan of care as evidenced by provider attestation and co-signature . Inter-disciplinary care team collaboration (see longitudinal plan of care) . Comprehensive medication review performed; medication list updated in electronic medical record  Hypertension (BP goal <130/80) -Controlled -Current treatment: . Amlodipine 5 mg 1/2 tablet daily -Medications previously tried: none  -Current home readings: could not provide -Current dietary habits: did not discuss -Current  exercise habits: did not discuss -Denies hypotensive/hypertensive symptoms -Educated on Importance of home blood pressure monitoring; Proper BP monitoring technique; -Counseled to monitor BP at home weekly, document, and provide log at future appointments -Recommended to continue current medication  Hyperlipidemia/CAD: (LDL goal < 70) -Controlled -Current treatment:  Aspirin 81 mg daily  Atorvastatin 40 mg daily -Medications previously tried: none  -Current dietary patterns: did not discuss -Current exercise habits: did not discuss -Educated on Cholesterol goals;  -Recommended to continue current medication  COPD (Goal: control symptoms and prevent exacerbations) -Uncontrolled -Current treatment   Ventolin HFA 2 puffs every 6 hours as needed   Spiriva respimat 1.25 mcg/act 2 puffs daily -Medications previously tried: Firefighter (recurrent pneumonia), Anoro (left a weird taste in her mouth) -Gold Grade: Gold 2 (FEV1 50-79%) -Current COPD Classification:  B (high sx, <2 exacerbations/yr) -CAT score: 18 -Pulmonary function testing: 01/17/17 -Exacerbations requiring treatment in last 6 months: yes -Patient denies consistent use of maintenance inhaler -Frequency of rescue inhaler use: daily -Counseled on Proper inhaler technique; Benefits of consistent maintenance inhaler use Differences between maintenance and rescue inhalers -Recommended to continue current medication Plan to follow up in 1 month to see how patient is handling using inhaler  Hypothyroidism (Goal: TSH 0.35-4.5) -Controlled -Current treatment  . Levothyroxine 75 mcg daily  -Medications previously tried: none  -Recommended to continue current medication  Allergic rhinitis (Goal: minimize symptoms of allergies) -Uncontrolled -Current treatment  . Flonase 50 mcg/act 1 spray twice daily  - not taking . Azelastine 0.1% nasal spray 2 sprays in both nostrils twice daily . Saline nasal spray as needed . Mucinex DM  syrup as needed . Benadryl as needed  for insect bites -Medications previously tried: several -Recommended mucinex without DM to try for congestion and if this does not help, try Zyrtec or Claritin over the counter Educated on the differences between the nasal sprays and various over the counter cold and cough products  GERD (Goal: minimize symptoms of heartburn/acid reflux) -Controlled -Current treatment  . Omeprazole 20 mg daily -Medications previously tried: none  -Recommended to continue current medication  Pain (Goal: minimize symptoms of pain) -Controlled -Current treatment  . Oxycodone 5-325 mg 1/2 tablet every 6 hours as needed -Medications previously tried: none  -Recommended to continue current medication Counseled on limiting use    Health Maintenance -Vaccine gaps: none -Current therapy:   Betamethasone 0.05% cream   Clotrimazole 1% cream   Docusate 100 mg BID (stopped diarrhea)   Nystatin cream  Prochlorperazine 10 mg q6hr  Probiotic 1 tablet daily -Educated on Supplements may interfere with prescription drugs -Patient is satisfied with current therapy and denies issues -Recommended to continue current medication  Patient Goals/Self-Care Activities . Over the next 90 days, patient will:  - take medications as prescribed check blood pressure weekly, document, and provide at future appointments  Follow Up Plan: Face to Face appointment with care management team member scheduled for:  3 months       Patient verbalizes understanding of instructions provided today and agrees to view in [place].  The pharmacy team will reach out to the patient again over the next 30 days.   Viona Gilmore, [facility]

## 2020-08-20 ENCOUNTER — Other Ambulatory Visit: Payer: Self-pay | Admitting: Family Medicine

## 2020-08-20 DIAGNOSIS — C3492 Malignant neoplasm of unspecified part of left bronchus or lung: Secondary | ICD-10-CM

## 2020-08-20 DIAGNOSIS — G894 Chronic pain syndrome: Secondary | ICD-10-CM

## 2020-08-20 NOTE — Telephone Encounter (Signed)
Pt call and stated she need a refill on her  oxyCODONE-acetaminophen (PERCOCET/ROXICET) 5-325 MG tablet sent to  [place], [address] Phone:  [phone]  Fax:  [phone]

## 2020-08-25 MED ORDER — OXYCODONE-ACETAMINOPHEN 5-325 MG PO TABS: 0.5000 | ORAL_TABLET | Freq: Three times a day (TID) | ORAL | 0 refills | Status: DC | PRN

## 2020-09-01 ENCOUNTER — Telehealth: Payer: Self-pay | Admitting: Pharmacist

## 2020-09-01 NOTE — Chronic Care Management (AMB) (Signed)
Chronic Care Management Pharmacy Assistant   Name: Felicia Herrera  MRN: [id number] DOB: [birth year]  Reason for Encounter: Disease State   Conditions to be addressed/monitored: COPD  Recent office visits: None  Recent consult visits:  None  Hospital visits:  None in previous 6 months  Medications: Outpatient Encounter Medications as of 09/01/2020  Medication Sig Note  . amLODipine (NORVASC) 5 MG tablet TAKE ONE TABLET BY MOUTH DAILY   . aspirin EC 81 MG tablet Take 81 mg by mouth daily.   Marland Kitchen atorvastatin (LIPITOR) 40 MG tablet TAKE 1 TABLET BY MOUTH  DAILY   . azelastine (ASTELIN) 0.1 % nasal spray Place 2 sprays into both nostrils 2 (two) times daily. Use in each nostril as directed   . betamethasone dipropionate (DIPROLENE) 0.05 % cream Apply topically daily as needed.   . clotrimazole (LOTRIMIN) 1 % cream Apply 1 application topically 2 (two) times daily. (Patient taking differently: Apply 1 application topically 2 (two) times daily as needed.)   . docusate sodium (COLACE) 100 MG capsule Take 1 capsule (100 mg total) by mouth 2 (two) times daily.   . fluticasone (FLONASE) 50 MCG/ACT nasal spray Place 1 spray into both nostrils 2 (two) times daily as needed.   Marland Kitchen guaiFENesin (MUCINEX) 600 MG 12 hr tablet Take 1 tablet (600 mg total) by mouth 2 (two) times daily.   Marland Kitchen guaiFENesin-dextromethorphan (ROBITUSSIN DM) 100-10 MG/5ML syrup Take 5 mLs by mouth every 4 (four) hours as needed for cough.   . levothyroxine (SYNTHROID) 75 MCG tablet TAKE 1 TABLET BY MOUTH  DAILY BEFORE BREAKFAST   . nystatin cream (MYCOSTATIN) Apply 1 application topically 2 (two) times daily. (Patient taking differently: Apply 1 application topically 2 (two) times daily as needed.)   . omeprazole (PRILOSEC) 20 MG capsule TAKE 1 CAPSULE BY MOUTH  DAILY   . oxyCODONE-acetaminophen (PERCOCET/ROXICET) 5-325 MG tablet Take 0.5 tablets by mouth every 8 (eight) hours as needed for severe pain. DUE FOR FOLLOW  UP LATE 05/2020.   . Probiotic Product (PROBIOTIC ADVANCED PO) Take 1 capsule by mouth daily.   . prochlorperazine (COMPAZINE) 10 MG tablet Take 1 tablet (10 mg total) by mouth every 6 (six) hours as needed for nausea or vomiting. 03/07/2017: As needed  . Respiratory Therapy Supplies (FLUTTER) DEVI Use as directed   . Tiotropium Bromide Monohydrate (SPIRIVA RESPIMAT) 1.25 MCG/ACT AERS Inhale 2 puffs into the lungs daily.    Facility-Administered Encounter Medications as of 09/01/2020  Medication  . sodium chloride flush (NS) 0.9 % injection 10 mL   . Current COPD regimen:  o Tiotropium Bromide Monohydrate (SPIRIVA RESPIMAT) 1.25 MCG/ACT AERS CAT Score 07/25/2020 11/15/2019  Total CAT Score 18 11  .  Marland Kitchen Any recent hospitalizations or ED visits since last visit with CPP? No . Reports COPD symptoms, including Wheezing . What recent interventions/DTPs have been made by any provider to improve breathing since last visit: . Tiotropium Bromide Monohydrate (SPIRIVA RESPIMAT) 1.25 MCG/ACT AERS . Have you had exacerbation/flare-up since last visit? No . What do you do when you are short of breath?  Adhere to COPD Action Plan Respiratory Devices/Equipment . Do you have a nebulizer? No . Do you use a Peak Flow Meter? No . Do you use a maintenance inhaler? Yes . How often do you forget to use your daily inhaler? None . Do you use a rescue inhaler? Yes . How often do you use your rescue inhaler?  infrequently . Do you  use a spacer with your inhaler? No  Adherence Review: . Does the patient have >5 day gap between last estimated fill date for maintenance inhaler medications? No   Star Rating Drugs:  Dispensed Quantity Pharmacy  Atorvastatin 40 mg 01.31.2022 [facility]    I spoke with the patient and discussed medication adherence. I question the patient on the effects of the changes from her medications from her last appointment. She states that she has been taking her medication as written. She  states that she has been taking Claritin. She has not seen much of a change since starting this. She denies ED visits since his last CPP follow-up. The patient denies any side effects with his medication. Also, denies any problems with his current pharmacy.  Maia Breslow, [place] Assistant [phone]

## 2020-09-18 ENCOUNTER — Telehealth: Payer: Self-pay | Admitting: Internal Medicine

## 2020-09-18 DIAGNOSIS — J449 Chronic obstructive pulmonary disease, unspecified: Secondary | ICD-10-CM

## 2020-09-18 NOTE — Telephone Encounter (Signed)
stop Spiriva  Go back to Felicia Herrera will have to see what the previous dosing was] 1 puff once daily [this seemed to help in the past]  Send prescription for another flutter valve

## 2020-09-18 NOTE — Telephone Encounter (Signed)
Attempted to call pt but unable to reach. Left message for her to return call. 

## 2020-09-18 NOTE — Telephone Encounter (Signed)
Pt stated that she feels that coming off the spiriva respimat she has great difficulty doing this medication.  She stated that having only the one hand to use to get the medication in is super hard.  She is willing to try another medication.   Pt also is requesting that she get another flutter valve.  She is needing this mailed to her and I advised that we would check with MR on getting a new one and will see if [facility] can mail this to her.  MR please advise. Thanks

## 2020-09-19 MED ORDER — BREO ELLIPTA 200-25 MCG/INH IN AEPB: 1.0000 | INHALATION_SPRAY | Freq: Every day | RESPIRATORY_TRACT | 5 refills | Status: DC

## 2020-09-19 NOTE — Telephone Encounter (Signed)
Pt returning a phone call. Pt can be reached at [phone]. Advised pt that flutter vlave is up front for her to pick up.

## 2020-09-19 NOTE — Telephone Encounter (Signed)
ATC patient, sounded like patient picked up the phone as the answering machine picked up.  Left a VM for patient to return call since she was not answering once the answering machine stopped playing.  When she returns call, let her know that the flutter valve is up front for her, she will need to sign the form with a clinical staff member when she comes to get it.  Will await her return call.

## 2020-09-19 NOTE — Telephone Encounter (Signed)
Called and spoke with pt letting verifying that she knew flutter valve was up front for her and stated that she was made aware. Also stated to her that MR said she could stop the Spiriva and go back on Breo and she verbalized understanding. Verified preferred pharmacy and sent Rx for Breo in for pt. Nothing further needed.

## 2020-09-23 ENCOUNTER — Ambulatory Visit (INDEPENDENT_AMBULATORY_CARE_PROVIDER_SITE_OTHER): Payer: Medicare Other

## 2020-09-23 ENCOUNTER — Other Ambulatory Visit: Payer: Self-pay

## 2020-09-23 VITALS — BP 130/78 | HR 90 | Temp 98.3°F | Wt 146.6 lb

## 2020-09-23 DIAGNOSIS — Z0000 Encounter for general adult medical examination without abnormal findings: Secondary | ICD-10-CM | POA: Diagnosis not present

## 2020-09-23 NOTE — Progress Notes (Signed)
Subjective:   Felicia Herrera is a 65 y.o. female who presents for an Initial Medicare Annual Wellness Visit.  Review of Systems     Cardiac Risk Factors include: dyslipidemia;hypertension     Objective:    Today's Vitals   09/23/20 1256  BP: 130/78  Pulse: 90  Temp: 98.3 F (36.8 C)  SpO2: 95%  Weight: 146 lb 9.6 oz (66.5 kg)  PainSc: 7    Body mass index is 26.81 kg/m.  Advanced Directives 09/23/2020 04/28/2020 10/26/2017 09/28/2017 08/31/2017 08/03/2017 07/13/2017  Does Patient Have a Medical Advance Directive? Yes Yes Yes Yes Yes Yes Yes  Type of Industrial/product designer of [employer] Power of [place];Living will [place];Living will [place];Living will [place];Living will [place];Living will  Copy of [place] in Chart? Yes - validated most recent copy scanned in chart (See row information) Yes - validated most recent copy scanned in chart (See row information) Yes Yes Yes Yes Yes  Would patient like information on creating a medical advance directive? - - - - - - No - Patient declined    Current Medications (verified) Outpatient Encounter Medications as of 09/23/2020  Medication Sig  . amLODipine (NORVASC) 5 MG tablet TAKE ONE TABLET BY MOUTH DAILY  . aspirin EC 81 MG tablet Take 81 mg by mouth daily.  Marland Kitchen atorvastatin (LIPITOR) 40 MG tablet TAKE 1 TABLET BY MOUTH  DAILY  . azelastine (ASTELIN) 0.1 % nasal spray Place 2 sprays into both nostrils 2 (two) times daily. Use in each nostril as directed  . betamethasone dipropionate (DIPROLENE) 0.05 % cream Apply topically daily as needed.  . clotrimazole (LOTRIMIN) 1 % cream Apply 1 application topically 2 (two) times daily. (Patient taking differently: Apply 1 application topically 2 (two) times daily as needed.)  . FIBER ADULT GUMMIES PO Take by mouth.  . fluticasone (FLONASE) 50  MCG/ACT nasal spray Place 1 spray into both nostrils 2 (two) times daily as needed.  . fluticasone furoate-vilanterol (BREO ELLIPTA) 200-25 MCG/INH AEPB Inhale 1 puff into the lungs daily.  Marland Kitchen guaiFENesin (MUCINEX) 600 MG 12 hr tablet Take 1 tablet (600 mg total) by mouth 2 (two) times daily.  Marland Kitchen guaiFENesin-dextromethorphan (ROBITUSSIN DM) 100-10 MG/5ML syrup Take 5 mLs by mouth every 4 (four) hours as needed for cough.  . levothyroxine (SYNTHROID) 75 MCG tablet TAKE 1 TABLET BY MOUTH  DAILY BEFORE BREAKFAST  . loratadine (CLARITIN) 10 MG tablet Take 10 mg by mouth daily.  Marland Kitchen nystatin cream (MYCOSTATIN) Apply 1 application topically 2 (two) times daily. (Patient taking differently: Apply 1 application topically 2 (two) times daily as needed.)  . omeprazole (PRILOSEC) 20 MG capsule TAKE 1 CAPSULE BY MOUTH  DAILY  . oxyCODONE-acetaminophen (PERCOCET/ROXICET) 5-325 MG tablet Take 0.5 tablets by mouth every 8 (eight) hours as needed for severe pain. DUE FOR FOLLOW UP LATE 05/2020.  . Probiotic Product (PROBIOTIC ADVANCED PO) Take 1 capsule by mouth daily.  Marland Kitchen Respiratory Therapy Supplies (FLUTTER) DEVI Use as directed  . [DISCONTINUED] docusate sodium (COLACE) 100 MG capsule Take 1 capsule (100 mg total) by mouth 2 (two) times daily. (Patient not taking: Reported on 09/23/2020)  . [DISCONTINUED] prochlorperazine (COMPAZINE) 10 MG tablet Take 1 tablet (10 mg total) by mouth every 6 (six) hours as needed for nausea or vomiting. (Patient not taking: Reported on 09/23/2020)   Facility-Administered Encounter Medications as of 09/23/2020  Medication  . sodium chloride flush (NS) 0.9 % injection 10 mL    Allergies (verified) Anoro ellipta [umeclidinium-vilanterol]   History: Past Medical History:  Diagnosis Date  . Adenocarcinoma of left lung, stage 2 ([place]) 12/27/2016  . Cancer [facility])    Lun cancer: Right Dx 2008, s/p lobectomy. Left Dx 09/2016  . COPD (chronic obstructive pulmonary disease) ([place])   . Encounter  for antineoplastic chemotherapy 12/27/2016  . History of radiation therapy 01/24/17-03/08/17   left lung 2 Gy in 30 fractions  . Hyperlipidemia   . Hypertension   . Stroke [facility])    Past Surgical History:  Procedure Laterality Date  . BREAST BIOPSY     several. Denies Hx of breast cancer.  . IR FLUORO GUIDE PORT INSERTION RIGHT  02/04/2017  . IR US GUIDE VASC ACCESS RIGHT  02/04/2017  . THORACOTOMY/LOBECTOMY Right 2008  . TONSILLECTOMY  1960   Family History  Problem Relation Age of Onset  . Arthritis Mother   . Lung cancer Mother 37       d.45 from cancer  . Hypertension Father   . Arthritis Father   . Pancreatic cancer Sister 60       d.60 from cancer  . Brain cancer Brother        d.~70  . Colon cancer Brother        d.~70  . Hypertension Brother   . Mental retardation Brother   . Melanoma Brother 50  . Lung cancer Maternal Aunt 66  . Colon cancer Maternal Grandfather   . Depression Neg Hx   . Heart disease Neg Hx    Social History   Socioeconomic History  . Marital status: Single    Spouse name: Not on file  . Number of children: Not on file  . Years of education: Not on file  . Highest education level: Not on file  Occupational History  . Occupation: Biomedical scientist  Tobacco Use  . Smoking status: Former Smoker    Packs/day: 1.00    Years: 30.00    Pack years: 30.00    Types: Cigarettes    Quit date: 06/21/2005    Years since quitting: 15.2  . Smokeless tobacco: Never Used  Vaping Use  . Vaping Use: Never used  Substance and Sexual Activity  . Alcohol use: No  . Drug use: No  . Sexual activity: Not Currently  Other Topics Concern  . 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    Social Determinants of Health   Financial Resource Strain: Low Risk   . Difficulty of Paying Living Expenses: Not hard at all  Food Insecurity: No Food Insecurity  . Worried About Charity fundraiser in the Last Year: Never true  . Ran Out of Food in the  Last Year: Never true  Transportation Needs: No Transportation Needs  . Lack of Transportation (Medical): No  . Lack of Transportation (Non-Medical): No  Physical Activity: Insufficiently Active  . Days of Exercise per Week: 6 days  . Minutes of Exercise per Session: 20 min  Stress: No Stress Concern Present  . Feeling of Stress : Not at all  Social Connections: Socially Isolated  . Frequency of Communication with Friends and Family: More than three times a week  . Frequency of Social Gatherings with Friends and Family: Three times a week  . Attends Religious Services: Never  . Active Member of Clubs or Organizations: No  . Attends  Club or Organization Meetings: Never  . Marital Status: Never married    Tobacco Counseling Counseling given: Not Answered   Clinical Intake:  Pre-visit preparation completed: Yes  Pain : 0-10 Pain Score: 7  Pain Type: Chronic pain Pain Location: Other (Comment) Pain Orientation: Right (right side pain) Pain Onset: More than a month ago Pain Frequency: Constant     BMI - recorded: 26.81 Nutritional Status: BMI 25 -29 Overweight Nutritional Risks: None Diabetes: No  How often do you need to have someone help you when you read instructions, pamphlets, or other written materials from your doctor or pharmacy?: 1 - Never  Diabetic?No  Interpreter Needed?: No  Information entered by :: Charlott Rakes, LPN   Activities of Daily Living In your present state of health, do you have any difficulty performing the following activities: 09/23/2020 02/18/2020  Hearing? N N  Vision? N N  Difficulty concentrating or making decisions? N N  Walking or climbing stairs? N N  Dressing or bathing? N N  Doing errands, shopping? N N  Preparing Food and eating ? N -  Using the Toilet? N -  In the past six months, have you accidently leaked urine? N -  Do you have problems with loss of bowel control? N -  Managing your Medications? N -  Managing your  Finances? N -  Housekeeping or managing your Housekeeping? N -  Some recent data might be hidden    Patient Care Team: [name], [name], MD as PCP - General (Family Medicine) Viona Gilmore, [facility] as Pharmacist (Pharmacist)  Indicate any recent Medical Services you may have received from other than [facility] providers in the past year (date may be approximate).     Assessment:   This is a routine wellness examination for Mora.  Hearing/Vision screen  Hearing Screening   125Hz  250Hz  500Hz  1000Hz  2000Hz  3000Hz  4000Hz  6000Hz  8000Hz   Right ear:           Left ear:           Comments: Pt denies any hearing exams   Vision Screening Comments: Pt stated she will make an appt with [facility] to follow up   Dietary issues and exercise activities discussed: Current Exercise Habits: Home exercise routine, Time (Minutes): 20, Frequency (Times/Week): 6, Weekly Exercise (Minutes/Week): 120  Goals    . Patient Stated     Exercise       Depression Screen PHQ 2/9 Scores 09/23/2020 02/18/2020 07/13/2017 04/04/2017 01/12/2017  PHQ - 2 Score 0 0 0 0 0    Fall Risk Fall Risk  09/23/2020 07/13/2017 04/04/2017 01/12/2017  Falls in the past year? 0 Yes Yes Yes  Number falls in past yr: 0 1 1 1   Injury with Fall? 0 Yes Yes Yes  Risk for fall due to : Impaired vision;Impaired balance/gait History of fall(s) - -  Follow up Falls prevention discussed - - -    FALL RISK PREVENTION PERTAINING TO THE HOME:  Any stairs in or around the home? No  If so, are there any without handrails? No  Home free of loose throw rugs in walkways, pet beds, electrical cords, etc? Yes  Adequate lighting in your home to reduce risk of falls? Yes   ASSISTIVE DEVICES UTILIZED TO PREVENT FALLS:  Life alert? Yes  Use of a cane, walker or w/c? No  Grab bars in the bathroom? Yes  Shower chair or bench in shower? Yes  Elevated toilet seat or a handicapped toilet? No  TIMED UP AND GO:  Was the test performed? Yes .  Length of  time to ambulate 10 feet: 15 sec.   Gait slow and steady without use of assistive device  Cognitive Function:     6CIT Screen 09/23/2020  What Year? 0 points  What month? 0 points  Count back from 20 0 points  Months in reverse 0 points  Repeat phrase 0 points    Immunizations Immunization History  Administered Date(s) Administered  . Influenza, Quadrivalent, Recombinant, Inj, Pf 03/12/2018  . Influenza,inj,Quad PF,6+ Mos 03/21/2016, 02/22/2017, 03/06/2019, 04/03/2020  . PFIZER(Purple Top)SARS-COV-2 Vaccination 09/10/2019, 10/02/2019, 03/18/2020  . Tdap 06/01/2019  . Zoster Recombinat (Shingrix) 06/01/2019, 08/13/2019    TDAP status: Up to date  Flu Vaccine status: Up to date    Covid-19 vaccine status: Completed vaccines  Qualifies for Shingles Vaccine? Yes   Zostavax completed Yes   Shingrix Completed?: Yes  Screening Tests Health Maintenance  Topic Date Due  . HIV Screening  Never done  . COVID-19 Vaccine (4 - Booster for Pfizer series) 09/15/2020  . PAP SMEAR-Modifier  10/18/2020  . INFLUENZA VACCINE  01/19/2021  . MAMMOGRAM  02/03/2022  . COLONOSCOPY (Pts 45-27yrs Insurance coverage will need to be confirmed)  04/27/2026  . TETANUS/TDAP  05/31/2029  . Hepatitis C Screening  Completed  . HPV VACCINES  Aged Out    Health Maintenance  Health Maintenance Due  Topic Date Due  . HIV Screening  Never done  . COVID-19 Vaccine (4 - Booster for Pfizer series) 09/15/2020  . PAP SMEAR-Modifier  10/18/2020    Colorectal cancer screening: Type of screening: Colonoscopy. Completed 04/27/16. Repeat every 10 years  Mammogram status: Completed 02/04/20. Repeat every year     Additional Screening:  Hepatitis C Screening:  Completed 08/13/19  Vision Screening: Recommended annual ophthalmology exams for early detection of glaucoma and other disorders of the eye. Is the patient up to date with their annual eye exam?  No  Who is the provider or what is the name of  the office in which the patient attends annual eye exams? Pt will make an appt with [facility] If pt is not established with a provider, would they like to be referred to a provider to establish care? No .   Dental Screening: Recommended annual dental exams for proper oral hygiene  Community Resource Referral / Chronic Care Management: CRR required this visit?  No   CCM required this visit?  No      Plan:     I have personally reviewed and noted the following in the patient's chart:   . Medical and social history . Use of alcohol, tobacco or illicit drugs  . Current medications and supplements . Functional ability and status . Nutritional status . Physical activity . Advanced directives . List of other physicians . Hospitalizations, surgeries, and ER visits in previous 12 months . Vitals . Screenings to include cognitive, depression, and falls . Referrals and appointments  In addition, I have reviewed and discussed with patient certain preventive protocols, quality metrics, and best practice recommendations. A written personalized care plan for preventive services as well as general preventive health recommendations were provided to patient.     Willette Brace, LPN   11/25/2092   Nurse Notes: None

## 2020-09-23 NOTE — Patient Instructions (Addendum)
Felicia Herrera , Thank you for taking time to come for your Medicare Wellness Visit. I appreciate your ongoing commitment to your health goals. Please review the following plan we discussed and let me know if I can assist you in the future.   Screening recommendations/referrals: Colonoscopy: Done 04/27/16 Mammogram: Done 02/04/20 Recommended yearly ophthalmology/optometry visit for glaucoma screening and checkup Recommended yearly dental visit for hygiene and checkup  Vaccinations: Influenza vaccine: Up to date Tdap vaccine: Up to date Shingles vaccine: Completed 06/01/19 & 08/13/19  Covid-19: Completed 3/22, 4/13, & 03/18/20  Advanced directives: Copies in chart  r Conditions/risks identified: Exercise   Next appointment: Follow up in one year for your annual wellness visit.   Preventive Care 40-64 Years, Female Preventive care refers to lifestyle choices and visits with your health care provider that can promote health and wellness. What does preventive care include?  A yearly physical exam. This is also called an annual well check.  Dental exams once or twice a year.  Routine eye exams. Ask your health care provider how often you should have your eyes checked.  Personal lifestyle choices, including:  Daily care of your teeth and gums.  Regular physical activity.  Eating a healthy diet.  Avoiding tobacco and drug use.  Limiting alcohol use.  Practicing safe sex.  Taking low-dose aspirin daily starting at age 10.  Taking vitamin and mineral supplements as recommended by your health care provider. What happens during an annual well check? The services and screenings done by your health care provider during your annual well check will depend on your age, overall health, lifestyle risk factors, and family history of disease. Counseling  Your health care provider may ask you questions about your:  Alcohol use.  Tobacco use.  Drug use.  Emotional well-being.  Home  and relationship well-being.  Sexual activity.  Eating habits.  Work and work Statistician.  Method of birth control.  Menstrual cycle.  Pregnancy history. Screening  You may have the following tests or measurements:  Height, weight, and BMI.  Blood pressure.  Lipid and cholesterol levels. These may be checked every 5 years, or more frequently if you are over 87 years old.  Skin check.  Lung cancer screening. You may have this screening every year starting at age 108 if you have a 30-pack-year history of smoking and currently smoke or have quit within the past 15 years.  Fecal occult blood test (FOBT) of the stool. You may have this test every year starting at age 54.  Flexible sigmoidoscopy or colonoscopy. You may have a sigmoidoscopy every 5 years or a colonoscopy every 10 years starting at age 48.  Hepatitis C blood test.  Hepatitis B blood test.  Sexually transmitted disease (STD) testing.  Diabetes screening. This is done by checking your blood sugar (glucose) after you have not eaten for a while (fasting). You may have this done every 1-3 years.  Mammogram. This may be done every 1-2 years. Talk to your health care provider about when you should start having regular mammograms. This may depend on whether you have a family history of breast cancer.  BRCA-related cancer screening. This may be done if you have a family history of breast, ovarian, tubal, or peritoneal cancers.  Pelvic exam and Pap test. This may be done every 3 years starting at age 71. Starting at age 61, this may be done every 5 years if you have a Pap test in combination with an HPV test.  Bone  density scan. This is done to screen for osteoporosis. You may have this scan if you are at high risk for osteoporosis. Discuss your test results, treatment options, and if necessary, the need for more tests with your health care provider. Vaccines  Your health care provider may recommend certain vaccines,  such as:  Influenza vaccine. This is recommended every year.  Tetanus, diphtheria, and acellular pertussis (Tdap, Td) vaccine. You may need a Td booster every 10 years.  Zoster vaccine. You may need this after age 41.  Pneumococcal 13-valent conjugate (PCV13) vaccine. You may need this if you have certain conditions and were not previously vaccinated.  Pneumococcal polysaccharide (PPSV23) vaccine. You may need one or two doses if you smoke cigarettes or if you have certain conditions. Talk to your health care provider about which screenings and vaccines you need and how often you need them. This information is not intended to replace advice given to you by your health care provider. Make sure you discuss any questions you have with your health care provider. Document Released: 07/04/2015 Document Revised: 02/25/2016 Document Reviewed: 04/08/2015 Elsevier Interactive Patient Education  2017 [place] Prevention in the Home Falls can cause injuries. They can happen to people of all ages. There are many things you can do to make your home safe and to help prevent falls. What can I do on the outside of my home?  Regularly fix the edges of walkways and driveways and fix any cracks.  Remove anything that might make you trip as you walk through a door, such as a raised step or threshold.  Trim any bushes or trees on the path to your home.  Use bright outdoor lighting.  Clear any walking paths of anything that might make someone trip, such as rocks or tools.  Regularly check to see if handrails are loose or broken. Make sure that both sides of any steps have handrails.  Any raised decks and porches should have guardrails on the edges.  Have any leaves, snow, or ice cleared regularly.  Use sand or salt on walking paths during winter.  Clean up any spills in your garage right away. This includes oil or grease spills. What can I do in the bathroom?  Use night  lights.  Install grab bars by the toilet and in the tub and shower. Do not use towel bars as grab bars.  Use non-skid mats or decals in the tub or shower.  If you need to sit down in the shower, use a plastic, non-slip stool.  Keep the floor dry. Clean up any water that spills on the floor as soon as it happens.  Remove soap buildup in the tub or shower regularly.  Attach bath mats securely with double-sided non-slip rug tape.  Do not have throw rugs and other things on the floor that can make you trip. What can I do in the bedroom?  Use night lights.  Make sure that you have a light by your bed that is easy to reach.  Do not use any sheets or blankets that are too big for your bed. They should not hang down onto the floor.  Have a firm chair that has side arms. You can use this for support while you get dressed.  Do not have throw rugs and other things on the floor that can make you trip. What can I do in the kitchen?  Clean up any spills right away.  Avoid walking on wet  floors.  Keep items that you use a lot in easy-to-reach places.  If you need to reach something above you, use a strong step stool that has a grab bar.  Keep electrical cords out of the way.  Do not use floor polish or wax that makes floors slippery. If you must use wax, use non-skid floor wax.  Do not have throw rugs and other things on the floor that can make you trip. What can I do with my stairs?  Do not leave any items on the stairs.  Make sure that there are handrails on both sides of the stairs and use them. Fix handrails that are broken or loose. Make sure that handrails are as long as the stairways.  Check any carpeting to make sure that it is firmly attached to the stairs. Fix any carpet that is loose or worn.  Avoid having throw rugs at the top or bottom of the stairs. If you do have throw rugs, attach them to the floor with carpet tape.  Make sure that you have a light switch at the  top of the stairs and the bottom of the stairs. If you do not have them, ask someone to add them for you. What else can I do to help prevent falls?  Wear shoes that:  Do not have high heels.  Have rubber bottoms.  Are comfortable and fit you well.  Are closed at the toe. Do not wear sandals.  If you use a stepladder:  Make sure that it is fully opened. Do not climb a closed stepladder.  Make sure that both sides of the stepladder are locked into place.  Ask someone to hold it for you, if possible.  Clearly mark and make sure that you can see:  Any grab bars or handrails.  First and last steps.  Where the edge of each step is.  Use tools that help you move around (mobility aids) if they are needed. These include:  Canes.  Walkers.  Scooters.  Crutches.  Turn on the lights when you go into a dark area. Replace any light bulbs as soon as they burn out.  Set up your furniture so you have a clear path. Avoid moving your furniture around.  If any of your floors are uneven, fix them.  If there are any pets around you, be aware of where they are.  Review your medicines with your doctor. Some medicines can make you feel dizzy. This can increase your chance of falling. Ask your doctor what other things that you can do to help prevent falls. This information is not intended to replace advice given to you by your health care provider. Make sure you discuss any questions you have with your health care provider. Document Released: 04/03/2009 Document Revised: 11/13/2015 Document Reviewed: 07/12/2014 Elsevier Interactive Patient Education  2017 [employer].

## 2020-09-29 ENCOUNTER — Other Ambulatory Visit: Payer: Self-pay | Admitting: Family Medicine

## 2020-09-29 DIAGNOSIS — C3492 Malignant neoplasm of unspecified part of left bronchus or lung: Secondary | ICD-10-CM

## 2020-09-29 DIAGNOSIS — G894 Chronic pain syndrome: Secondary | ICD-10-CM

## 2020-09-29 MED ORDER — OXYCODONE-ACETAMINOPHEN 5-325 MG PO TABS
0.5000 | ORAL_TABLET | Freq: Three times a day (TID) | ORAL | 0 refills | Status: DC | PRN
Start: 2020-09-29 — End: 2020-10-27

## 2020-09-29 NOTE — Telephone Encounter (Signed)
Pt call and want a refill on her oxyCODONE-acetaminophen (PERCOCET/ROXICET) 5-325 MG tablet sent to  [facility], [place] [address] Phone:  [phone]  Fax:  [phone]

## 2020-10-16 ENCOUNTER — Telehealth: Payer: Self-pay | Admitting: Family Medicine

## 2020-10-16 DIAGNOSIS — J310 Chronic rhinitis: Secondary | ICD-10-CM

## 2020-10-16 DIAGNOSIS — E039 Hypothyroidism, unspecified: Secondary | ICD-10-CM

## 2020-10-16 DIAGNOSIS — K219 Gastro-esophageal reflux disease without esophagitis: Secondary | ICD-10-CM

## 2020-10-16 DIAGNOSIS — E785 Hyperlipidemia, unspecified: Secondary | ICD-10-CM

## 2020-10-16 DIAGNOSIS — I10 Essential (primary) hypertension: Secondary | ICD-10-CM

## 2020-10-16 NOTE — Telephone Encounter (Signed)
Pt is requesting a three-month supply with three refills on the following medications. pt asking the medication to be sent to [facility]  [phone]) -fax  [phone]) If there are questions about any medications, the CMA can call her.    AmLODipine    (NORVASC) 5 MG tablet   atorvastatin (LIPITOR) 40 MG tablet   azelastine (ASTELIN) 0.1 % nasal spray   fluticasone furoate-vilanterol (BREO ELLIPTA) 200-25 MCG/INH AEPB   levothyroxine (SYNTHROID) 75 MCG tablet   omeprazole (PRILOSEC) 20 MG capsule

## 2020-10-17 MED ORDER — LEVOTHYROXINE SODIUM 75 MCG PO TABS: 75.0000 ug | ORAL_TABLET | Freq: Every day | ORAL | 3 refills | Status: DC

## 2020-10-17 MED ORDER — AZELASTINE HCL 0.1 % NA SOLN: 2.0000 | Freq: Two times a day (BID) | NASAL | 2 refills | Status: DC

## 2020-10-17 MED ORDER — OMEPRAZOLE 20 MG PO CPDR: 1.0000 | DELAYED_RELEASE_CAPSULE | Freq: Every day | ORAL | 3 refills | Status: DC

## 2020-10-17 MED ORDER — ATORVASTATIN CALCIUM 40 MG PO TABS: 1.0000 | ORAL_TABLET | Freq: Every day | ORAL | 3 refills | Status: DC

## 2020-10-17 MED ORDER — AMLODIPINE BESYLATE 5 MG PO TABS: 1.0000 | ORAL_TABLET | Freq: Every day | ORAL | 3 refills | Status: DC

## 2020-10-17 NOTE — Telephone Encounter (Signed)
Rx's sent in. °

## 2020-10-24 ENCOUNTER — Inpatient Hospital Stay: Payer: Medicare Other

## 2020-10-24 ENCOUNTER — Other Ambulatory Visit: Payer: Self-pay

## 2020-10-24 ENCOUNTER — Inpatient Hospital Stay: Payer: Medicare Other | Attending: Internal Medicine

## 2020-10-24 ENCOUNTER — Encounter (HOSPITAL_COMMUNITY): Payer: Self-pay

## 2020-10-24 ENCOUNTER — Ambulatory Visit (HOSPITAL_COMMUNITY)
Admission: RE | Admit: 2020-10-24 | Discharge: 2020-10-24 | Disposition: A | Discharge status: Home / Self Care | Payer: Medicare Other | Source: Ambulatory Visit | Attending: Internal Medicine | Admitting: Internal Medicine

## 2020-10-24 DIAGNOSIS — Z79899 Other long term (current) drug therapy: Secondary | ICD-10-CM | POA: Insufficient documentation

## 2020-10-24 DIAGNOSIS — Z9221 Personal history of antineoplastic chemotherapy: Secondary | ICD-10-CM | POA: Insufficient documentation

## 2020-10-24 DIAGNOSIS — J449 Chronic obstructive pulmonary disease, unspecified: Secondary | ICD-10-CM | POA: Insufficient documentation

## 2020-10-24 DIAGNOSIS — Z902 Acquired absence of lung [part of]: Secondary | ICD-10-CM | POA: Insufficient documentation

## 2020-10-24 DIAGNOSIS — Z923 Personal history of irradiation: Secondary | ICD-10-CM | POA: Diagnosis not present

## 2020-10-24 DIAGNOSIS — Z7951 Long term (current) use of inhaled steroids: Secondary | ICD-10-CM | POA: Insufficient documentation

## 2020-10-24 DIAGNOSIS — C3490 Malignant neoplasm of unspecified part of unspecified bronchus or lung: Secondary | ICD-10-CM | POA: Diagnosis not present

## 2020-10-24 DIAGNOSIS — Z7982 Long term (current) use of aspirin: Secondary | ICD-10-CM | POA: Insufficient documentation

## 2020-10-24 DIAGNOSIS — J432 Centrilobular emphysema: Secondary | ICD-10-CM | POA: Insufficient documentation

## 2020-10-24 DIAGNOSIS — Z85118 Personal history of other malignant neoplasm of bronchus and lung: Secondary | ICD-10-CM | POA: Insufficient documentation

## 2020-10-24 DIAGNOSIS — R918 Other nonspecific abnormal finding of lung field: Secondary | ICD-10-CM | POA: Diagnosis not present

## 2020-10-24 LAB — CBC WITH DIFFERENTIAL (CANCER CENTER ONLY)
Abs Immature Granulocytes: 0.01 10*3/uL (ref 0.00–0.07)
Basophils Absolute: 0 10*3/uL (ref 0.0–0.1)
Basophils Relative: 0 %
Eosinophils Absolute: 0.3 10*3/uL (ref 0.0–0.5)
Eosinophils Relative: 4 %
HCT: 36.2 % (ref 36.0–46.0)
Hemoglobin: 11.7 g/dL — ABNORMAL LOW (ref 12.0–15.0)
Immature Granulocytes: 0 %
Lymphocytes Relative: 16 %
Lymphs Abs: 1.3 10*3/uL (ref 0.7–4.0)
MCH: 27.3 pg (ref 26.0–34.0)
MCHC: 32.3 g/dL (ref 30.0–36.0)
MCV: 84.6 fL (ref 80.0–100.0)
Monocytes Absolute: 0.6 10*3/uL (ref 0.1–1.0)
Monocytes Relative: 7 %
Neutro Abs: 5.8 10*3/uL (ref 1.7–7.7)
Neutrophils Relative %: 73 %
Platelet Count: 379 10*3/uL (ref 150–400)
RBC: 4.28 MIL/uL (ref 3.87–5.11)
RDW: 14.6 % (ref 11.5–15.5)
WBC Count: 8 10*3/uL (ref 4.0–10.5)
nRBC: 0 % (ref 0.0–0.2)

## 2020-10-24 LAB — CMP (CANCER CENTER ONLY)
ALT: 26 U/L (ref 0–44)
AST: 19 U/L (ref 15–41)
Albumin: 3.6 g/dL (ref 3.5–5.0)
Alkaline Phosphatase: 101 U/L (ref 38–126)
Anion gap: 8 (ref 5–15)
BUN: 11 mg/dL (ref 8–23)
CO2: 27 mmol/L (ref 22–32)
Calcium: 8.8 mg/dL — ABNORMAL LOW (ref 8.9–10.3)
Chloride: 107 mmol/L (ref 98–111)
Creatinine: 0.68 mg/dL (ref 0.44–1.00)
GFR, Estimated: >60 mL/min (ref >60)
Glucose, Bld: 104 mg/dL — ABNORMAL HIGH (ref 70–99)
Potassium: 3.7 mmol/L (ref 3.5–5.1)
Sodium: 142 mmol/L (ref 135–145)
Total Bilirubin: 0.4 mg/dL (ref 0.3–1.2)
Total Protein: 7 g/dL (ref 6.5–8.1)

## 2020-10-24 MED ORDER — HEPARIN SOD (PORK) LOCK FLUSH 100 UNIT/ML IV SOLN: 500.0000 [IU] | Freq: Once | INTRAVENOUS | Status: AC

## 2020-10-24 MED ORDER — HEPARIN SOD (PORK) LOCK FLUSH 100 UNIT/ML IV SOLN
INTRAVENOUS | Status: AC
  Administered 2020-10-24: 500 [IU] via INTRAVENOUS
  Filled 2020-10-24: qty 5

## 2020-10-24 MED ORDER — IOHEXOL 300 MG/ML  SOLN
75.0000 mL | Freq: Once | INTRAMUSCULAR | Status: AC | PRN
  Administered 2020-10-24: 75 mL via INTRAVENOUS

## 2020-10-24 MED ORDER — SODIUM CHLORIDE (PF) 0.9 % IJ SOLN
INTRAMUSCULAR | Status: AC
  Filled 2020-10-24: qty 50

## 2020-10-27 ENCOUNTER — Other Ambulatory Visit: Payer: Self-pay | Admitting: Family Medicine

## 2020-10-27 ENCOUNTER — Inpatient Hospital Stay (HOSPITAL_BASED_OUTPATIENT_CLINIC_OR_DEPARTMENT_OTHER): Payer: Medicare Other | Admitting: Internal Medicine

## 2020-10-27 ENCOUNTER — Other Ambulatory Visit: Payer: Self-pay

## 2020-10-27 VITALS — BP 139/77 | HR 80 | Temp 97.8°F | Resp 18 | Wt 147.0 lb

## 2020-10-27 DIAGNOSIS — J432 Centrilobular emphysema: Secondary | ICD-10-CM | POA: Diagnosis not present

## 2020-10-27 DIAGNOSIS — Z902 Acquired absence of lung [part of]: Secondary | ICD-10-CM | POA: Diagnosis not present

## 2020-10-27 DIAGNOSIS — C3490 Malignant neoplasm of unspecified part of unspecified bronchus or lung: Secondary | ICD-10-CM | POA: Diagnosis not present

## 2020-10-27 DIAGNOSIS — C3492 Malignant neoplasm of unspecified part of left bronchus or lung: Secondary | ICD-10-CM | POA: Diagnosis not present

## 2020-10-27 DIAGNOSIS — G894 Chronic pain syndrome: Secondary | ICD-10-CM

## 2020-10-27 DIAGNOSIS — Z923 Personal history of irradiation: Secondary | ICD-10-CM | POA: Diagnosis not present

## 2020-10-27 DIAGNOSIS — Z85118 Personal history of other malignant neoplasm of bronchus and lung: Secondary | ICD-10-CM | POA: Diagnosis not present

## 2020-10-27 DIAGNOSIS — Z9221 Personal history of antineoplastic chemotherapy: Secondary | ICD-10-CM | POA: Diagnosis not present

## 2020-10-27 DIAGNOSIS — J449 Chronic obstructive pulmonary disease, unspecified: Secondary | ICD-10-CM | POA: Diagnosis not present

## 2020-10-27 DIAGNOSIS — Z79899 Other long term (current) drug therapy: Secondary | ICD-10-CM | POA: Diagnosis not present

## 2020-10-27 DIAGNOSIS — Z7951 Long term (current) use of inhaled steroids: Secondary | ICD-10-CM | POA: Diagnosis not present

## 2020-10-27 DIAGNOSIS — Z7982 Long term (current) use of aspirin: Secondary | ICD-10-CM | POA: Diagnosis not present

## 2020-10-27 NOTE — Progress Notes (Signed)
.mo      [place] [phone]   [phone]  OFFICE PROGRESS NOTE  [name], [name], MD [place] [address]  DIAGNOSIS: Stage IIB (T2b, N1, M0) non-small cell lung cancer, adenocarcinoma presented with large left upper lobe lung mass in addition to left hilar lymphadenopathy diagnosed in April 2018. Her previous workup was done at [facility] in [facility]. There was insufficient material to perform the molecular studies.  [facility] Molecular studies: BRCA2 N344fs. Negative for EGFR, ALK, ROS1, BRAF mutations.  PRIOR THERAPY:  1) A course of concurrent chemoradiation with weekly carboplatin for AUC of 2 and paclitaxel 45 MG/M2. First dose 01/24/2017. Status post 6 cycles. 2) Consolidation immunotherapy with Imfinzi (Durvalumab) 10 mg/KG every 2 weeks status post 26 cycles.   CURRENT THERAPY: Observation.  INTERVAL HISTORY: Felicia Herrera 65 y.o. female returns to the clinic today for follow-up visit.  The patient is feeling fine today with no concerning complaints but she lost few teeth recently.  She denied having any current chest pain, shortness of breath, cough or hemoptysis.  She denied having any fever or chills.  She has no nausea, vomiting, diarrhea or constipation.  She has no headache or visual changes.  The patient had repeat CT scan of the chest performed recently and she is here for evaluation and discussion of her risk her results.  MEDICAL HISTORY: Past Medical History:  Diagnosis Date  . Adenocarcinoma of left lung, stage 2 ([place]) 12/27/2016  . Cancer [facility])    Lun cancer: Right Dx 2008, s/p lobectomy. Left Dx 09/2016  . COPD (chronic obstructive pulmonary disease) ([place])   . Encounter for antineoplastic chemotherapy 12/27/2016  . History of radiation therapy 01/24/17-03/08/17   left lung 2 Gy in 30 fractions  . Hyperlipidemia   . Hypertension   . Stroke [facility])     ALLERGIES:  is  allergic to anoro ellipta [umeclidinium-vilanterol].  MEDICATIONS:  Current Outpatient Medications  Medication Sig Dispense Refill  . amLODipine (NORVASC) 5 MG tablet Take 1 tablet (5 mg total) by mouth daily. 90 tablet 3  . aspirin EC 81 MG tablet Take 81 mg by mouth daily.    Marland Kitchen atorvastatin (LIPITOR) 40 MG tablet Take 1 tablet (40 mg total) by mouth daily. 90 tablet 3  . azelastine (ASTELIN) 0.1 % nasal spray Place 2 sprays into both nostrils 2 (two) times daily. Use in each nostril as directed 30 mL 2  . betamethasone dipropionate (DIPROLENE) 0.05 % cream Apply topically daily as needed. 30 Herrera 2  . clotrimazole (LOTRIMIN) 1 % cream Apply 1 application topically 2 (two) times daily. (Patient taking differently: Apply 1 application topically 2 (two) times daily as needed.) 30 Herrera 2  . FIBER ADULT GUMMIES PO Take by mouth.    . fluticasone (FLONASE) 50 MCG/ACT nasal spray Place 1 spray into both nostrils 2 (two) times daily as needed. 16 Herrera 3  . fluticasone furoate-vilanterol (BREO ELLIPTA) 200-25 MCG/INH AEPB Inhale 1 puff into the lungs daily. 60 each 5  . guaiFENesin (MUCINEX) 600 MG 12 hr tablet Take 1 tablet (600 mg total) by mouth 2 (two) times daily. 60 tablet 2  . levothyroxine (SYNTHROID) 75 MCG tablet Take 1 tablet (75 mcg total) by mouth daily before breakfast. 90 tablet 3  . loratadine (CLARITIN) 10 MG tablet Take 10 mg by mouth daily.    Marland Kitchen nystatin cream (MYCOSTATIN) Apply 1 application topically 2 (two) times daily. (  Patient taking differently: Apply 1 application topically 2 (two) times daily as needed.) 30 Herrera 0  . omeprazole (PRILOSEC) 20 MG capsule Take 1 capsule (20 mg total) by mouth daily. 90 capsule 3  . oxyCODONE-acetaminophen (PERCOCET/ROXICET) 5-325 MG tablet Take 0.5 tablets by mouth every 8 (eight) hours as needed for severe pain. DUE FOR FOLLOW UP LATE 05/2020. 45 tablet 0  . Probiotic Product (PROBIOTIC ADVANCED PO) Take 1 capsule by mouth daily.    Marland Kitchen Respiratory Therapy  Supplies (FLUTTER) DEVI Use as directed 1 each 0   No current facility-administered medications for this visit.   Facility-Administered Medications Ordered in Other Visits  Medication Dose Route Frequency Provider Last Rate Last Admin  . sodium chloride flush (NS) 0.9 % injection 10 mL  10 mL Intracatheter PRN Curt Bears, MD   10 mL at 06/22/17 1747    SURGICAL HISTORY:  Past Surgical History:  Procedure Laterality Date  . BREAST BIOPSY     several. Denies Hx of breast cancer.  . IR FLUORO GUIDE PORT INSERTION RIGHT  02/04/2017  . IR US GUIDE VASC ACCESS RIGHT  02/04/2017  . THORACOTOMY/LOBECTOMY Right 2008  . TONSILLECTOMY  1960    REVIEW OF SYSTEMS:  A comprehensive review of systems was negative.   PHYSICAL EXAMINATION: General appearance: alert, cooperative and no distress Head: Normocephalic, without obvious abnormality, atraumatic Neck: no adenopathy, no JVD, supple, symmetrical, trachea midline and thyroid not enlarged, symmetric, no tenderness/mass/nodules Lymph nodes: Cervical, supraclavicular, and axillary nodes normal. Resp: clear to auscultation bilaterally Back: symmetric, no curvature. ROM normal. No CVA tenderness. Cardio: regular rate and rhythm, S1, S2 normal, no murmur, click, rub or gallop GI: soft, non-tender; bowel sounds normal; no masses,  no organomegaly Extremities: extremities normal, atraumatic, no cyanosis or edema  ECOG PERFORMANCE STATUS: 1 - Symptomatic but completely ambulatory  Blood pressure 139/77, pulse 80, temperature 97.8 F (36.6 C), temperature source Tympanic, resp. rate 18, weight 147 lb (66.7 kg), SpO2 95 %.  LABORATORY DATA: Lab Results  Component Value Date   WBC 8.0 10/24/2020   HGB 11.7 (L) 10/24/2020   HCT 36.2 10/24/2020   MCV 84.6 10/24/2020   PLT 379 10/24/2020      Chemistry      Component Value Date/Time   NA 142 10/24/2020 1149   NA 142 06/22/2017 1335   K 3.7 10/24/2020 1149   K 3.9 06/22/2017 1335   CL  107 10/24/2020 1149   CO2 27 10/24/2020 1149   CO2 26 06/22/2017 1335   BUN 11 10/24/2020 1149   BUN 13.6 06/22/2017 1335   CREATININE 0.68 10/24/2020 1149   CREATININE 0.7 06/22/2017 1335      Component Value Date/Time   CALCIUM 8.8 (L) 10/24/2020 1149   CALCIUM 9.4 06/22/2017 1335   ALKPHOS 101 10/24/2020 1149   ALKPHOS 93 06/22/2017 1335   AST 19 10/24/2020 1149   AST 12 06/22/2017 1335   ALT 26 10/24/2020 1149   ALT 15 06/22/2017 1335   BILITOT 0.4 10/24/2020 1149   BILITOT 0.25 06/22/2017 1335       RADIOGRAPHIC STUDIES: CT Chest W Contrast  Result Date: 10/26/2020 CLINICAL DATA:  History of right pneumonectomy for right lung cancer in 2008. History of stage IIB left upper lobe lung adenocarcinoma in 2018 status post concurrent chemoradiation therapy and consolidation immunotherapy. Interval observation. Restaging. EXAM: CT CHEST WITH CONTRAST TECHNIQUE: Multidetector CT imaging of the chest was performed during intravenous contrast administration. CONTRAST:  40mL OMNIPAQUE IOHEXOL  300 MG/ML  SOLN COMPARISON:  04/25/2020 chest CT. FINDINGS: Cardiovascular: Normal heart size. No significant pericardial effusion/thickening. Right internal jugular Port-A-Cath terminates at the cavoatrial junction. Atherosclerotic nonaneurysmal thoracic aorta. Dilated main pulmonary artery (3.5 cm diameter), stable. No central pulmonary emboli. Mediastinum/Nodes: No discrete thyroid nodules. Unremarkable esophagus. No pathologically enlarged axillary, mediastinal or hilar lymph nodes. Lungs/Pleura: No pneumothorax. Status post right pneumonectomy. Chronic mild smooth right pleural thickening. Tiny amount of gas in the right pleural space without significant right pleural fluid, nonspecific, as seen intermittently on prior chest CT studies. No left pleural effusion. Stable lower tracheal stent extending to the carina. Stable sharply marginated bandlike central posterior left upper lobe lung consolidation  with associated volume loss and distortion, compatible with radiation fibrosis. Mild-to-moderate patchy tree-in-bud opacity throughout the basilar and dependent left lower lobe, similar to minimally increased. No acute consolidative airspace disease or new significant pulmonary nodules. Moderate centrilobular emphysema. Upper abdomen: Small hiatal hernia. Musculoskeletal: No aggressive appearing focal osseous lesions. Stable healed postsurgical changes in the right second through sixth ribs. Stable here old posterolateral left sixth through tenth rib fractures. Mild thoracic spondylosis. Chronic mild T3 compression deformity is unchanged. IMPRESSION: 1. No evidence of local tumor recurrence status post right pneumonectomy. 2. 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 3. No evidence of metastatic disease in the chest. 4. Mild-to-moderate patchy tree-in-bud opacity throughout the basilar and dependent left lower lobe, similar to minimally increased, favor recurrent aspiration. 5. Stable dilated main pulmonary artery, suggesting chronic pulmonary arterial hypertension. 6. Small hiatal hernia. 7. Aortic Atherosclerosis (ICD10-I70.0) and Emphysema (ICD10-J43.9). Electronically Signed   By: Ilona Sorrel M.D.   On: 10/26/2020 13:38    ASSESSMENT AND PLAN: This is a very pleasant 65 years old white female recently diagnosed with unresectable a stage IIB non-small cell lung cancer, adenocarcinoma. The patient underwent a course of concurrent chemoradiation with weekly carboplatin and paclitaxel status post 6 cycles.  She tolerated the treatment well and had partial response. She completed a course of consolidation treatment with immunotherapy with Imfinzi (Durvalumab) status post 26 cycles.  She tolerated her treatment well with no concerning adverse effects. The patient has been in observation for more than 3 years now with no concerning issues. She had  repeat CT scan of the chest performed recently.  I personally and independently reviewed the scan and discussed the results with the patient today. Her scan showed no concerning findings for disease progression. I recommended for her to continue on observation with repeat CT scan of the chest in 6 months. She was advised to call immediately if she has any concerning symptoms in the interval. For the history of COPD she will continue her routine follow-up visit and evaluation by Dr. Chase Caller. The patient voices understanding of current disease status and treatment options and is in agreement with the current care plan. All questions were answered. The patient knows to call the clinic with any problems, questions or concerns. We can certainly see the patient much sooner if necessary.  Disclaimer: This note was dictated with voice recognition software. Similar sounding words can inadvertently be transcribed and may not be corrected upon review.

## 2020-10-27 NOTE — Telephone Encounter (Signed)
Pt is calling in needing a refill on Rx Oxycodone-acetaminophen (PERCOCET/ROXICET) 5-325 MG Pharm:  [facility] on [address].  Pt is aware that provider is out of the office and will be returning 11/07/2020 but we will see if we can get another provider to assist with this.

## 2020-10-28 ENCOUNTER — Telehealth: Payer: Self-pay | Admitting: Internal Medicine

## 2020-10-28 MED ORDER — OXYCODONE-ACETAMINOPHEN 5-325 MG PO TABS: 0.5000 | ORAL_TABLET | Freq: Three times a day (TID) | ORAL | 0 refills | Status: DC | PRN

## 2020-10-28 NOTE — Telephone Encounter (Signed)
Scheduled per los. Called and spoke with patient. Confirmed appt 

## 2020-10-29 ENCOUNTER — Telehealth: Payer: Self-pay | Admitting: Pharmacist

## 2020-10-29 NOTE — Chronic Care Management (AMB) (Signed)
I made two attempts to reach patient about /her upcoming appointment on 10/30/2020 @ 11:30 am with the clinical pharmacist. Her voicemail was full and I was not able to leave a message.  Maia Breslow, [place] [phone] .amf

## 2020-10-30 ENCOUNTER — Ambulatory Visit (INDEPENDENT_AMBULATORY_CARE_PROVIDER_SITE_OTHER): Payer: Medicare Other | Admitting: Pharmacist

## 2020-10-30 ENCOUNTER — Other Ambulatory Visit: Payer: Self-pay

## 2020-10-30 DIAGNOSIS — I10 Essential (primary) hypertension: Secondary | ICD-10-CM | POA: Diagnosis not present

## 2020-10-30 DIAGNOSIS — Z8709 Personal history of other diseases of the respiratory system: Secondary | ICD-10-CM

## 2020-10-30 NOTE — Progress Notes (Signed)
Chronic Care Management Pharmacy Note  10/30/2020 Name:  Maat Kafer MRN:  [id number] DOB:  Mar 27, 1956  Subjective: Haylyn Halberg is an 65 y.o. year old female who is a primary patient of [name], Malka So, MD.  The CCM team was consulted for assistance with disease management and care coordination needs.    Engaged with patient face to face for follow up visit in response to provider referral for pharmacy case management and/or care coordination services.   Consent to Services:  The patient was given information about Chronic Care Management services, agreed to services, and gave verbal consent prior to initiation of services.  Please see initial visit note for detailed documentation.   Patient Care Team: [name], [name], MD as PCP - General (Family Medicine) Viona Gilmore, [facility] as Pharmacist (Pharmacist)  Recent office visits: 09/23/20 Charlott Rakes, LPN: Patient presented for AWV.  07/02/20 Betty [name], MD: Patient presented for fatigue and chronic conditions follow up. Prescribed azelastine nasal spray.  02/18/20  Betty [name], MD: Patient presented for pain management.  Recent consult visits: 10/27/20 Curt Bears, MD (oncology): Patient presented for lung cancer follow up.  09/18/20 Brand Males, MD (pulmonary): Telephone encounter - patient instructed to stop Spiriva and start Breo.  07/25/20 Brand Males, MD (pulmonary): Patient presented for COPD follow up. Prescribed Spiriva 2 puffs daily.  06/02/20 Geraldo Pitter, NP (pulmonary): Patient presented for COPD follow up. Prescribed cefdinir and prednisone for acute exacerbation.  04/28/20 Curt Bears, MD (oncology): Patient presented for follow up. BP was elevated in office.  Hospital visits: None in previous 6 months  Objective:  Lab Results  Component Value Date   CREATININE 0.68 10/24/2020   BUN 11 10/24/2020   GFR 77.38 07/15/2020   GFRNONAA >60 10/24/2020   GFRAA >60 10/22/2019   NA  142 10/24/2020   K 3.7 10/24/2020   CALCIUM 8.8 (L) 10/24/2020   CO2 27 10/24/2020    Lab Results  Component Value Date/Time   HGBA1C 6.1 (A) 02/12/2019 05:27 PM   HGBA1C 6.0 10/18/2017 12:13 PM   GFR 77.38 07/15/2020 07:14 AM   GFR 80.21 11/04/2016 12:55 PM    Last diabetic Eye exam: No results found for: HMDIABEYEEXA  Last diabetic Foot exam: No results found for: HMDIABFOOTEX   Lab Results  Component Value Date   CHOL 128 06/01/2019   HDL 42.60 06/01/2019   LDLCALC 60 06/01/2019   TRIG 128.0 06/01/2019   CHOLHDL 3 06/01/2019    Hepatic Function Latest Ref Rng & Units 10/24/2020 07/15/2020 04/25/2020  Total Protein 6.5 - 8.1 g/dL 7.0 7.7 7.0  Albumin 3.5 - 5.0 g/dL 3.6 4.3 3.4(L)  AST 15 - 41 U/L 19 16 12(L)  ALT 0 - 44 U/L _0 Alk Phosphatase 38 - 126 U/L 101 104 101  Total Bilirubin 0.3 - 1.2 mg/dL 0.4 0.4 0.2(L)    Lab Results  Component Value Date/Time   TSH 0.78 07/02/2020 03:37 PM   TSH 3.76 06/01/2019 07:35 AM    CBC Latest Ref Rng & Units 10/24/2020 04/25/2020 10/22/2019  WBC 4.0 - 10.5 K/uL 8.0 6.2 6.8  Hemoglobin 12.0 - 15.0 g/dL 11.7(L) 9.5(L) 11.8(L)  Hematocrit 36.0 - 46.0 % 36.2 31.3(L) 39.3  Platelets 150 - 400 K/uL 379 437(H) 347    No results found for: VD25OH  Clinical ASCVD: No  The ASCVD Risk score Mikey Bussing DC Jr., et al., 2013) failed to calculate for the following reasons:   The valid total  cholesterol range is 130 to 320 mg/dL    Depression screen [facility] 2/9 09/23/2020 02/18/2020 07/13/2017  Decreased Interest 0 0 0  Down, Depressed, Hopeless 0 0 0  PHQ - 2 Score 0 0 0  Some recent data might be hidden     Social History   Tobacco Use  Smoking Status Former Smoker  . Packs/day: 1.00  . Years: 30.00  . Pack years: 30.00  . Types: Cigarettes  . Quit date: 06/21/2005  . Years since quitting: 15.3  Smokeless Tobacco Never Used   BP Readings from Last 3 Encounters:  10/27/20 139/77  09/23/20 130/78  07/25/20 110/84   Pulse Readings  from Last 3 Encounters:  10/27/20 80  09/23/20 90  07/25/20 88   Wt Readings from Last 3 Encounters:  10/27/20 147 lb (66.7 kg)  09/23/20 146 lb 9.6 oz (66.5 kg)  07/25/20 144 lb 12.8 oz (65.7 kg)    Assessment/Interventions: Review of patient past medical history, allergies, medications, health status, including review of consultants reports, laboratory and other test data, was performed as part of comprehensive evaluation and provision of chronic care management services.   SDOH:  (Social Determinants of Health) assessments and interventions performed: No   CCM Care Plan  Allergies  Allergen Reactions  . Anoro Ellipta [Umeclidinium-Vilanterol]     Taste issues    Medications Reviewed Today    Reviewed by Curt Bears, MD (Physician) on 10/27/20 at 1541  Med List Status: <None>  Medication Order Taking? Sig Documenting Provider Last Dose Status Informant  amLODipine (NORVASC) 5 MG tablet [id number] Yes Take 1 tablet (5 mg total) by mouth daily. [name], [name], MD Taking Active   aspirin EC 81 MG tablet [id number] Yes Take 81 mg by mouth daily. [provider] Taking Active   atorvastatin (LIPITOR) 40 MG tablet [id number] Yes Take 1 tablet (40 mg total) by mouth daily. [name], [name], MD Taking Active   azelastine (ASTELIN) 0.1 % nasal spray [id number] Yes Place 2 sprays into both nostrils 2 (two) times daily. Use in each nostril as directed [name], [name], MD Taking Active   betamethasone dipropionate (DIPROLENE) 0.05 % cream [id number] Yes Apply topically daily as needed. [name], [name], MD Taking Active   clotrimazole (LOTRIMIN) 1 % cream [id number] Yes Apply 1 application topically 2 (two) times daily.  Patient taking differently: Apply 1 application topically 2 (two) times daily as needed.   [name], [name], MD Taking Active   FIBER ADULT GUMMIES PO [id number] Yes Take by mouth. [provider] Taking Active   fluticasone (FLONASE) 50 MCG/ACT nasal spray  [id number] Yes Place 1 spray into both nostrils 2 (two) times daily as needed. [name], [name], MD Taking Active   fluticasone furoate-vilanterol (BREO ELLIPTA) 200-25 MCG/INH AEPB [id number] Yes Inhale 1 puff into the lungs daily. Brand Males, MD Taking Active   guaiFENesin (MUCINEX) 600 MG 12 hr tablet [id number] Yes Take 1 tablet (600 mg total) by mouth 2 (two) times daily. Martyn Ehrich, NP Taking Active         Discontinued 10/27/20 1505 (Patient Preference)   levothyroxine (SYNTHROID) 75 MCG tablet [id number] Yes Take 1 tablet (75 mcg total) by mouth daily before breakfast. [name], [name], MD Taking Active   loratadine (CLARITIN) 10 MG tablet [id number] Yes Take 10 mg by mouth daily. [provider] Taking Active   nystatin cream (MYCOSTATIN) [id number] Yes Apply 1 application topically 2 (two) times daily.  Patient taking differently: Apply  1 application topically 2 (two) times daily as needed.   Maryanna Shape, NP Taking Active   omeprazole (PRILOSEC) 20 MG capsule [id number] Yes Take 1 capsule (20 mg total) by mouth daily. [name], [name], MD Taking Active   oxyCODONE-acetaminophen (PERCOCET/ROXICET) 5-325 MG tablet [id number] Yes Take 0.5 tablets by mouth every 8 (eight) hours as needed for severe pain. DUE FOR FOLLOW UP LATE 05/2020. [name], [name], MD Taking Active   Probiotic Product (PROBIOTIC ADVANCED PO) [id number] Yes Take 1 capsule by mouth daily. [provider] Taking Active   Respiratory Therapy Supplies (FLUTTER) DEVI [id number] Yes Use as directed Martyn Ehrich, NP Taking Active   sodium chloride flush (NS) 0.9 % injection 10 mL [id number]   Curt Bears, MD  Active           Patient Active Problem List   Diagnosis Date Noted  . Constipation 12/27/2019  . Atherosclerosis of aorta ([place]) 06/01/2019  . Left lower lobe pneumonia 07/19/2018  . Chronic pain disorder 05/15/2018  . Intertrigo 05/15/2018  . Hyperlipidemia 10/18/2017  .  Rhinitis, chronic 10/18/2017  . Port-A-Cath in place 05/25/2017  . Encounter for antineoplastic immunotherapy 05/10/2017  . Intractable intercostal neuropathic pain 05/10/2017  . Genetic testing 03/09/2017  . 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 Stage 2 moderate COPD by GOLD classification ([place]) 02/22/2017  . Adenocarcinoma of left lung, stage 2 ([place]) 12/27/2016  . Encounter for antineoplastic chemotherapy 12/27/2016  . Goals of care, counseling/discussion 12/27/2016  . GERD (gastroesophageal reflux disease) 12/17/2016  . Hypertension, essential, benign 12/17/2016  . History of COPD 12/09/2016  . History of lung cancer 12/09/2016  . Lung cancer ([place]) 11/04/2016  . Primary hypothyroidism 11/04/2016    Immunization History  Administered Date(s) Administered  . Influenza, Quadrivalent, Recombinant, Inj, Pf 03/12/2018  . Influenza,inj,Quad PF,6+ Mos 03/21/2016, 02/22/2017, 03/06/2019, 04/03/2020  . PFIZER(Purple Top)SARS-COV-2 Vaccination 09/10/2019, 10/02/2019, 03/18/2020  . Tdap 06/01/2019  . Zoster Recombinat (Shingrix) 06/01/2019, 08/13/2019   124/53 left arm sitting - automatic 134/90 left arm sitting - automatic  120/60 left arm sitting - manual    BP Readings from Last 3 Encounters:  10/27/20 139/77  09/23/20 130/78  07/25/20 110/84     Conditions to be addressed/monitored:  Hypertension, Coronary Artery Disease, COPD, Hypothyroidism and lung cancer  Care Plan : [place]  Updates made by Viona Gilmore, [place] since 10/30/2020 12:00 AM    Problem: Problem: Hypertension, Coronary Artery Disease, COPD, Hypothyroidism and lung cancer     Long-Range Goal: Patient-Specific Goal   Start Date: 08/01/2020  Expected End Date: 08/01/2021  Recent Progress: On track  Priority: High  Note:   Current Barriers:  . Unable to independently monitor therapeutic efficacy . Unable to self administer medications as prescribed  Pharmacist Clinical  Goal(s):  Marland Kitchen Over the next 120 days, patient will achieve adherence to monitoring guidelines and medication adherence to achieve therapeutic efficacy . achieve control of blood pressure as evidenced by home blood pressure monitoring  through collaboration with PharmD and provider.   Interventions: . 1:1 collaboration with [name], [name], MD regarding development and update of comprehensive plan of care as evidenced by provider attestation and co-signature . Inter-disciplinary care team collaboration (see longitudinal plan of care) . Comprehensive medication review performed; medication list updated in electronic medical record  Hypertension (BP goal <130/80) -Controlled -Current treatment: . Amlodipine 5 mg 1/2 tablet daily -Medications previously tried: none  -Current home readings: could not provide -  Current dietary habits: did not discuss -Current exercise habits: did not discuss -Denies hypotensive/hypertensive symptoms -Educated on Importance of home blood pressure monitoring; Proper BP monitoring technique; -Counseled to monitor BP at home weekly, document, and provide log at future appointments -Recommended to continue current medication -Advised on proper use of blood pressure cuff and compared office to home BP readings  Hyperlipidemia/CAD: (LDL goal < 70) -Controlled -Current treatment:  Aspirin 81 mg daily  Atorvastatin 40 mg daily -Medications previously tried: none  -Current dietary patterns: did not discuss -Current exercise habits: did not discuss -Educated on Cholesterol goals;  -Recommended to continue current medication  COPD (Goal: control symptoms and prevent exacerbations) -Uncontrolled -Current treatment   Ventolin HFA 2 puffs every 6 hours as needed   Breo ellipta 1 puff daily -Medications previously tried: Spiriva (difficult to use), Breo (recurrent pneumonia), Anoro (left a weird taste in her mouth) -Gold Grade: Gold 2 (FEV1 50-79%) -Current COPD  Classification:  B (high sx, <2 exacerbations/yr) -CAT score: 18 -Pulmonary function testing: 01/17/17 -Exacerbations requiring treatment in last 6 months: yes -Patient denies consistent use of maintenance inhaler -Frequency of rescue inhaler use: daily -Counseled on Proper inhaler technique; Benefits of consistent maintenance inhaler use Differences between maintenance and rescue inhalers -Recommended to continue current medication Recommended for patient to reach out to pulmonary for assistance on new inspire device  Hypothyroidism (Goal: TSH 0.35-4.5) -Controlled -Current treatment  . Levothyroxine 75 mcg daily  -Medications previously tried: none  -Recommended to continue current medication  Allergic rhinitis (Goal: minimize symptoms of allergies) -Uncontrolled -Current treatment  . Flonase 50 mcg/act 1 spray twice daily  - not taking . Azelastine 0.1% nasal spray 2 sprays in both nostrils twice daily . Saline nasal spray as needed . Mucinex 600 mg tablets as needed . Benadryl as needed for insect bites -Medications previously tried: several -Recommended mucinex without DM to try for congestion and if this does not help, try Zyrtec or Claritin over the counter Educated on the differences between the nasal sprays and various over the counter cold and cough products  GERD (Goal: minimize symptoms of heartburn/acid reflux) -Controlled -Current treatment  . Omeprazole 20 mg daily -Medications previously tried: none  -Recommended to continue current medication  Pain (Goal: minimize symptoms of pain) -Controlled -Current treatment  . Oxycodone 5-325 mg 1/2 tablet every 6 hours as needed -Medications previously tried: none  -Recommended to continue current medication Counseled on limiting use    Health Maintenance -Vaccine gaps: none -Current therapy:   Betamethasone 0.05% cream   Clotrimazole 1% cream   Docusate 100 mg BID (stopped diarrhea)   Nystatin  cream  Prochlorperazine 10 mg q6hr  Probiotic 1 tablet daily -Educated on Supplements may interfere with prescription drugs -Patient is satisfied with current therapy and denies issues -Recommended to continue current medication  Patient Goals/Self-Care Activities . Over the next 120 days, patient will:  - take medications as prescribed check blood pressure weekly, document, and provide at future appointments  Follow Up Plan: Face to Face appointment with care management team member scheduled for:  4 months      Patient Care Plan: CCM Pharmacy Care Plan    Problem Identified: Problem: Hypertension, Coronary Artery Disease, COPD, Hypothyroidism and lung cancer     Long-Range Goal: Patient-Specific Goal   Start Date: 08/01/2020  Expected End Date: 08/01/2021  Recent Progress: On track  Priority: High  Note:   Current Barriers:  . Unable to independently monitor therapeutic efficacy . Unable  to self administer medications as prescribed  Pharmacist Clinical Goal(s):  Marland Kitchen Over the next 120 days, patient will achieve adherence to monitoring guidelines and medication adherence to achieve therapeutic efficacy . achieve control of blood pressure as evidenced by home blood pressure monitoring  through collaboration with PharmD and provider.   Interventions: . 1:1 collaboration with [name], [name], MD regarding development and update of comprehensive plan of care as evidenced by provider attestation and co-signature . Inter-disciplinary care team collaboration (see longitudinal plan of care) . Comprehensive medication review performed; medication list updated in electronic medical record  Hypertension (BP goal <130/80) -Controlled -Current treatment: . Amlodipine 5 mg 1/2 tablet daily -Medications previously tried: none  -Current home readings: could not provide -Current dietary habits: did not discuss -Current exercise habits: did not discuss -Denies hypotensive/hypertensive  symptoms -Educated on Importance of home blood pressure monitoring; Proper BP monitoring technique; -Counseled to monitor BP at home weekly, document, and provide log at future appointments -Recommended to continue current medication -Advised on proper use of blood pressure cuff and compared office to home BP readings  Hyperlipidemia/CAD: (LDL goal < 70) -Controlled -Current treatment:  Aspirin 81 mg daily  Atorvastatin 40 mg daily -Medications previously tried: none  -Current dietary patterns: did not discuss -Current exercise habits: did not discuss -Educated on Cholesterol goals;  -Recommended to continue current medication  COPD (Goal: control symptoms and prevent exacerbations) -Uncontrolled -Current treatment   Ventolin HFA 2 puffs every 6 hours as needed   Breo ellipta 1 puff daily -Medications previously tried: Spiriva (difficult to use), Breo (recurrent pneumonia), Anoro (left a weird taste in her mouth) -Gold Grade: Gold 2 (FEV1 50-79%) -Current COPD Classification:  B (high sx, <2 exacerbations/yr) -CAT score: 18 -Pulmonary function testing: 01/17/17 -Exacerbations requiring treatment in last 6 months: yes -Patient denies consistent use of maintenance inhaler -Frequency of rescue inhaler use: daily -Counseled on Proper inhaler technique; Benefits of consistent maintenance inhaler use Differences between maintenance and rescue inhalers -Recommended to continue current medication Recommended for patient to reach out to pulmonary for assistance on new inspire device  Hypothyroidism (Goal: TSH 0.35-4.5) -Controlled -Current treatment  . Levothyroxine 75 mcg daily  -Medications previously tried: none  -Recommended to continue current medication  Allergic rhinitis (Goal: minimize symptoms of allergies) -Uncontrolled -Current treatment  . Flonase 50 mcg/act 1 spray twice daily  - not taking . Azelastine 0.1% nasal spray 2 sprays in both nostrils twice  daily . Saline nasal spray as needed . Mucinex 600 mg tablets as needed . Benadryl as needed for insect bites -Medications previously tried: several -Recommended mucinex without DM to try for congestion and if this does not help, try Zyrtec or Claritin over the counter Educated on the differences between the nasal sprays and various over the counter cold and cough products  GERD (Goal: minimize symptoms of heartburn/acid reflux) -Controlled -Current treatment  . Omeprazole 20 mg daily -Medications previously tried: none  -Recommended to continue current medication  Pain (Goal: minimize symptoms of pain) -Controlled -Current treatment  . Oxycodone 5-325 mg 1/2 tablet every 6 hours as needed -Medications previously tried: none  -Recommended to continue current medication Counseled on limiting use    Health Maintenance -Vaccine gaps: none -Current therapy:   Betamethasone 0.05% cream   Clotrimazole 1% cream   Docusate 100 mg BID (stopped diarrhea)   Nystatin cream  Prochlorperazine 10 mg q6hr  Probiotic 1 tablet daily -Educated on Supplements may interfere with prescription drugs -Patient is satisfied  with current therapy and denies issues -Recommended to continue current medication  Patient Goals/Self-Care Activities . Over the next 120 days, patient will:  - take medications as prescribed check blood pressure weekly, document, and provide at future appointments  Follow Up Plan: Face to Face appointment with care management team member scheduled for:  4 months     Medication Assistance: None required.  Patient affirms current coverage meets needs.  Patient's preferred pharmacy is:  [facility] [address] [place] - [address] [place] [place] [address] Phone: [phone] Fax: [phone]  Uses pill box? Yes Pt endorses 99% compliance  We discussed: Current pharmacy is preferred with insurance plan and patient is  satisfied with pharmacy services Patient decided to: Continue current medication management strategy  Care Plan and Follow Up Patient Decision:  Patient agrees to Care Plan and Follow-up.  Plan: Face to Face appointment with care management team member scheduled for: 4 months  Jeni Salles, PharmD [facility] Pharmacist [place] at [place] [phone]

## 2020-10-30 NOTE — Patient Instructions (Addendum)
How to Take Your Blood Pressure Blood pressure measures how strongly your blood is pressing against the walls of your arteries. Arteries are blood vessels that carry blood from your heart throughout your body. You can take your blood pressure at home with a machine. You may need to check your blood pressure at home:  To check if you have high blood pressure (hypertension).  To check your blood pressure over time.  To make sure your blood pressure medicine is working. Supplies needed:  Blood pressure machine, or monitor.  Dining room chair to sit in.  Table or desk.  Small notebook.  Pencil or pen. How to prepare Avoid these things for 30 minutes before checking your blood pressure:  Having drinks with caffeine in them, such as coffee or tea.  Drinking alcohol.  Eating.  Smoking.  Exercising. Do these things five minutes before checking your blood pressure:  Go to the bathroom and pee (urinate).  Sit in a dining chair. Do not sit in a soft couch or an armchair.  Be quiet. Do not talk. How to take your blood pressure Follow the instructions that came with your machine. If you have a digital blood pressure monitor, these may be the instructions: 1. Sit up straight. 2. Place your feet on the floor. Do not cross your ankles or legs. 3. Rest your left arm at the level of your heart. You may rest it on a table, desk, or chair. 4. Pull up your shirt sleeve. 5. Wrap the blood pressure cuff around the upper part of your left arm. The cuff should be 1 inch (2.5 cm) above your elbow. It is best to wrap the cuff around bare skin. 6. Fit the cuff snugly around your arm. You should be able to place only one finger between the cuff and your arm. 7. Place the cord so that it rests in the bend of your elbow. 8. Press the power button. 9. Sit quietly while the cuff fills with air and loses air. 10. Write down the numbers on the screen. 11. Wait 2-3 minutes and then repeat steps 1-10.    What do the numbers mean? Two numbers make up your blood pressure. The first number is called systolic pressure. The second is called diastolic pressure. An example of a blood pressure reading is "120 over 80" (or 120/80). If you are an adult and do not have a medical condition, use this guide to find out if your blood pressure is normal: Normal  First number: below 120.  Second number: below 80. Elevated  First number: 120-129.  Second number: below 80. Hypertension stage 1  First number: 130-139.  Second number: 80-89. Hypertension stage 2  First number: 140 or above.  Second number: 43 or above. Your blood pressure is above normal even if only the top or bottom number is above normal. Follow these instructions at home:  Check your blood pressure as often as your doctor tells you to.  Check your blood pressure at the same time every day.  Take your monitor to your next doctor's appointment. Your doctor will: ? Make sure you are using it correctly. ? Make sure it is working right.  Make sure you understand what your blood pressure numbers should be.  Tell your doctor if your medicine is causing side effects.  Keep all follow-up visits as told by your doctor. This is important. General tips:  You will need a blood pressure machine, or monitor. Your doctor can suggest a  monitor. You can buy one at a drugstore or online. When choosing one: ? Choose one with an arm cuff. ? Choose one that wraps around your upper arm. Only one finger should fit between your arm and the cuff. ? Do not choose one that measures your blood pressure from your wrist or finger. Where to find more information American Heart Association: www.heart.org Contact a doctor if:  Your blood pressure keeps being high. Get help right away if:  Your first blood pressure number is higher than 180.  Your second blood pressure number is higher than 120. Summary  Check your blood pressure at the same  time every day.  Avoid caffeine, alcohol, smoking, and exercise for 30 minutes before checking your blood pressure.  Make sure you understand what your blood pressure numbers should be. This information is not intended to replace advice given to you by your health care provider. Make sure you discuss any questions you have with your health care provider. Document Revised: 06/01/2019 Document Reviewed: 06/01/2019 Elsevier Patient Education  [place].  Visit Information  Goals Addressed   None    Patient Care Plan: CCM Pharmacy Care Plan    Problem Identified: Problem: Hypertension, Coronary Artery Disease, COPD, Hypothyroidism and lung cancer     Long-Range Goal: Patient-Specific Goal   Start Date: 08/01/2020  Expected End Date: 08/01/2021  Recent Progress: On track  Priority: High  Note:   Current Barriers:  . Unable to independently monitor therapeutic efficacy . Unable to self administer medications as prescribed  Pharmacist Clinical Goal(s):  Marland Kitchen Over the next 120 days, patient will achieve adherence to monitoring guidelines and medication adherence to achieve therapeutic efficacy . achieve control of blood pressure as evidenced by home blood pressure monitoring  through collaboration with PharmD and provider.   Interventions: . 1:1 collaboration with [name], [name], MD regarding development and update of comprehensive plan of care as evidenced by provider attestation and co-signature . Inter-disciplinary care team collaboration (see longitudinal plan of care) . Comprehensive medication review performed; medication list updated in electronic medical record  Hypertension (BP goal <130/80) -Controlled -Current treatment: . Amlodipine 5 mg 1/2 tablet daily -Medications previously tried: none  -Current home readings: could not provide -Current dietary habits: did not discuss -Current exercise habits: did not discuss -Denies hypotensive/hypertensive  symptoms -Educated on Importance of home blood pressure monitoring; Proper BP monitoring technique; -Counseled to monitor BP at home weekly, document, and provide log at future appointments -Recommended to continue current medication -Advised on proper use of blood pressure cuff and compared office to home BP readings  Hyperlipidemia/CAD: (LDL goal < 70) -Controlled -Current treatment:  Aspirin 81 mg daily  Atorvastatin 40 mg daily -Medications previously tried: none  -Current dietary patterns: did not discuss -Current exercise habits: did not discuss -Educated on Cholesterol goals;  -Recommended to continue current medication  COPD (Goal: control symptoms and prevent exacerbations) -Uncontrolled -Current treatment   Ventolin HFA 2 puffs every 6 hours as needed   Breo ellipta 1 puff daily -Medications previously tried: Spiriva (difficult to use), Breo (recurrent pneumonia), Anoro (left a weird taste in her mouth) -Gold Grade: Gold 2 (FEV1 50-79%) -Current COPD Classification:  B (high sx, <2 exacerbations/yr) -CAT score: 18 -Pulmonary function testing: 01/17/17 -Exacerbations requiring treatment in last 6 months: yes -Patient denies consistent use of maintenance inhaler -Frequency of rescue inhaler use: daily -Counseled on Proper inhaler technique; Benefits of consistent maintenance inhaler use Differences between maintenance and rescue inhalers -Recommended  to continue current medication Recommended for patient to reach out to pulmonary for assistance on new inspire device  Hypothyroidism (Goal: TSH 0.35-4.5) -Controlled -Current treatment  . Levothyroxine 75 mcg daily  -Medications previously tried: none  -Recommended to continue current medication  Allergic rhinitis (Goal: minimize symptoms of allergies) -Uncontrolled -Current treatment  . Flonase 50 mcg/act 1 spray twice daily  - not taking . Azelastine 0.1% nasal spray 2 sprays in both nostrils twice  daily . Saline nasal spray as needed . Mucinex 600 mg tablets as needed . Benadryl as needed for insect bites -Medications previously tried: several -Recommended mucinex without DM to try for congestion and if this does not help, try Zyrtec or Claritin over the counter Educated on the differences between the nasal sprays and various over the counter cold and cough products  GERD (Goal: minimize symptoms of heartburn/acid reflux) -Controlled -Current treatment  . Omeprazole 20 mg daily -Medications previously tried: none  -Recommended to continue current medication  Pain (Goal: minimize symptoms of pain) -Controlled -Current treatment  . Oxycodone 5-325 mg 1/2 tablet every 6 hours as needed -Medications previously tried: none  -Recommended to continue current medication Counseled on limiting use    Health Maintenance -Vaccine gaps: none -Current therapy:   Betamethasone 0.05% cream   Clotrimazole 1% cream   Docusate 100 mg BID (stopped diarrhea)   Nystatin cream  Prochlorperazine 10 mg q6hr  Probiotic 1 tablet daily -Educated on Supplements may interfere with prescription drugs -Patient is satisfied with current therapy and denies issues -Recommended to continue current medication  Patient Goals/Self-Care Activities . Over the next 120 days, patient will:  - take medications as prescribed check blood pressure weekly, document, and provide at future appointments  Follow Up Plan: Face to Face appointment with care management team member scheduled for:  4 months       Patient verbalizes understanding of instructions provided today and agrees to view in [place].  Face to Face appointment with pharmacist scheduled for: 4 months  Viona Gilmore, [facility]

## 2020-11-11 ENCOUNTER — Ambulatory Visit: Payer: Medicare Other | Admitting: Family Medicine

## 2020-11-19 ENCOUNTER — Encounter: Payer: Self-pay | Admitting: Family Medicine

## 2020-11-19 ENCOUNTER — Other Ambulatory Visit: Payer: Self-pay

## 2020-11-19 ENCOUNTER — Other Ambulatory Visit (HOSPITAL_COMMUNITY)
Admission: RE | Admit: 2020-11-19 | Discharge: 2020-11-19 | Disposition: A | Discharge status: Home / Self Care | Payer: Medicare Other | Source: Ambulatory Visit | Attending: Family Medicine | Admitting: Family Medicine

## 2020-11-19 ENCOUNTER — Ambulatory Visit (INDEPENDENT_AMBULATORY_CARE_PROVIDER_SITE_OTHER): Payer: Medicare Other | Admitting: Family Medicine

## 2020-11-19 VITALS — BP 126/80 | HR 97 | Resp 20 | Ht 62.0 in | Wt 149.5 lb

## 2020-11-19 DIAGNOSIS — R21 Rash and other nonspecific skin eruption: Secondary | ICD-10-CM

## 2020-11-19 DIAGNOSIS — C3492 Malignant neoplasm of unspecified part of left bronchus or lung: Secondary | ICD-10-CM | POA: Diagnosis not present

## 2020-11-19 DIAGNOSIS — I700 Atherosclerosis of aorta: Secondary | ICD-10-CM | POA: Diagnosis not present

## 2020-11-19 DIAGNOSIS — L304 Erythema intertrigo: Secondary | ICD-10-CM

## 2020-11-19 DIAGNOSIS — Z1151 Encounter for screening for human papillomavirus (HPV): Secondary | ICD-10-CM | POA: Diagnosis not present

## 2020-11-19 DIAGNOSIS — G894 Chronic pain syndrome: Secondary | ICD-10-CM

## 2020-11-19 DIAGNOSIS — R7303 Prediabetes: Secondary | ICD-10-CM

## 2020-11-19 DIAGNOSIS — Z0000 Encounter for general adult medical examination without abnormal findings: Secondary | ICD-10-CM | POA: Diagnosis not present

## 2020-11-19 DIAGNOSIS — Z780 Asymptomatic menopausal state: Secondary | ICD-10-CM | POA: Diagnosis not present

## 2020-11-19 DIAGNOSIS — Z01419 Encounter for gynecological examination (general) (routine) without abnormal findings: Secondary | ICD-10-CM | POA: Diagnosis present

## 2020-11-19 DIAGNOSIS — Z124 Encounter for screening for malignant neoplasm of cervix: Secondary | ICD-10-CM

## 2020-11-19 DIAGNOSIS — E785 Hyperlipidemia, unspecified: Secondary | ICD-10-CM

## 2020-11-19 DIAGNOSIS — Z01411 Encounter for gynecological examination (general) (routine) with abnormal findings: Secondary | ICD-10-CM

## 2020-11-19 MED ORDER — CLOTRIMAZOLE-BETAMETHASONE 1-0.05 % EX CREA: 1.0000 "application " | TOPICAL_CREAM | Freq: Every day | CUTANEOUS | 1 refills | Status: DC

## 2020-11-19 NOTE — Patient Instructions (Addendum)
Today you have you routine preventive visit. A few things to remember from today's visit:   Encounter for gynecological examination with abnormal finding  Chronic pain disorder  Prediabetes - Plan: Hemoglobin A1c  Intertrigo  Screening for malignant neoplasm of cervix - Plan: PAP [[place]]  Atherosclerosis of aorta (HCC) - Plan: Lipid panel  Hyperlipidemia, unspecified hyperlipidemia type - Plan: Lipid panel  Asymptomatic postmenopausal estrogen deficiency - Plan: DG Bone Density  Adenocarcinoma of left lung, stage 2 ([place])  If you need refills please call your pharmacy. Do not use My Chart to request refills or for acute issues that need immediate attention. No changes today.   Please be sure medication list is accurate. If a new problem present, please set up appointment sooner than planned today.  At least 150 minutes of moderate exercise per week, daily brisk walking for 15-30 min is a good exercise option. Healthy diet low in saturated (animal) fats and sweets and consisting of fresh fruits and vegetables, lean meats such as fish and white chicken and whole grains.  These are some of recommendations for screening depending of age and risk factors:  - Vaccines:  Tdap vaccine every 10 years.  Shingles vaccine recommended at age 78, could be given after 64 years of age but not sure about insurance coverage.   Pneumonia vaccines: Pneumovax at 32. Sometimes Pneumovax is giving earlier if history of smoking, lung disease,diabetes,kidney disease among some.  Screening for diabetes at age 52 and every 3 years.  Cervical cancer prevention:  Pap smear starts at 64 years of age and continues periodically until 64 years old in low risk women. Pap smear every 3 years between 52 and 61 years old. Pap smear every 3-5 years between women 84 and older if pap smear negative and HPV screening negative.   -Breast cancer: Mammogram: There is disagreement between experts about  when to start screening in low risk asymptomatic female but recent recommendations are to start screening at 17 and not later than 64 years old , every 1-2 years and after 64 yo q 2 years. Screening is recommended until 64 years old but some women can continue screening depending of healthy issues.  Colon cancer screening: Has been recently changed to 64 yo. Insurance may not cover until you are 64 years old. Screening is recommended until 64 years old.  Cholesterol disorder screening at age 50 and every 3 years.N/A  Also recommended:  1. Dental visit- Brush and floss your teeth twice daily; visit your dentist twice a year. 2. Eye doctor- Get an eye exam at least every 2 years. 3. Helmet use- Always wear a helmet when riding a bicycle, motorcycle, rollerblading or skateboarding. 4. Safe sex- If you may be exposed to sexually transmitted infections, use a condom. 5. Seat belts- Seat belts can save your live; always wear one. 6. Smoke/Carbon Monoxide detectors- These detectors need to be installed on the appropriate level of your home. Replace batteries at least once a year. 7. Skin cancer- When out in the sun please cover up and use sunscreen 15 SPF or higher. 8. Violence- If anyone is threatening or hurting you, please tell your healthcare provider.  9. Drink alcohol in moderation- Limit alcohol intake to one drink or less per day. Never drink and drive. 10. Calcium supplementation 1000 to 1200 mg daily, ideally through your diet.  Vitamin D supplementation 800 units daily.

## 2020-11-19 NOTE — Progress Notes (Signed)
HPI: Felicia Herrera is a 65 y.o. female, who is here today for her routine physical.  Last CPE: 10/18/2017.  Regular exercise 3 or more time per week: Tries to walk at least a miles per day unless it is hot outdoors. Following a healthy diet: Yes, she cooks at home. Eats out once per month. She lives alone.   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  Pap smear:  Component 3 yr ago  Adequacy Satisfactory for evaluation endocervical/transformation zone component PRESENT.   Diagnosis NEGATIVE FOR INTRAEPITHELIAL LESIONS OR MALIGNANCY.   HPV NOT DETECTED   Comment: Normal Reference Range - NOT Detected  Material Submitted CervicoVaginal Pap [ThinPrep Imaged]   CYTOLOGY - PAP PAP RESULT    Hx of abnormal pap smears: Roberto Scales thinks within the past 10 years but not sure about abnormality. M: 15 G: 0 LMP at age 19.  Immunization History  Administered Date(s) Administered  . Influenza, Quadrivalent, Recombinant, Inj, Pf 03/12/2018  . Influenza,inj,Quad PF,6+ Mos 03/21/2016, 02/22/2017, 03/06/2019, 04/03/2020  . PFIZER(Purple Top)SARS-COV-2 Vaccination 09/10/2019, 10/02/2019, 03/18/2020  . Tdap 06/01/2019  . Zoster Recombinat (Shingrix) 06/01/2019, 08/13/2019   Mammogram: 02/04/20. Colonoscopy: 04/27/16 DEXA: N/A Hep C screening: 08/13/19 NR  Concerns today: She would like a referral to dermatologist. Intermittent erythematous,pruritic,and some times sore rash under breast and around scar on right thoracic area s/p right lobectomy. She is applying nystatin cream, which helps. Exacerbated by heat. No associated fever,changes in appetite,or unusual fatigue.  Chronic pain: Severe pain residual after right-sided lobectomy and around scar. She is on Percocet 5-325 mg 1/2 tab tid. Taking medication pain is 3-4/10. Medication is helping with pain. It has not aggravated  constipation.  Aortic atherosclerosis and HLD: She is on Atorvastatin 40 mg daily and Aspirin 81 mg daily. Last FLP on 06/01/19: TC 128,LDL 60,TG 128,HDL 42.  HTN on non pharmacologic treatment. Prediabetes: HgA1C was 6.1 in 01/2019.  COPD: Following with pulmonologist. Respiratory symptoms have improved. Occasional productive cough, no wheezing or SOB.  Review of Systems  Constitutional: Positive for fatigue. Negative for activity change, appetite change and fever.  HENT: Negative for mouth sores, nosebleeds, sore throat and trouble swallowing.   Eyes: Negative for redness and visual disturbance.  Respiratory: Negative for cough, shortness of breath and wheezing.   Cardiovascular: Negative for chest pain, palpitations and leg swelling.  Gastrointestinal: Negative for abdominal pain, nausea and vomiting.       Negative for changes in bowel habits.  Endocrine: Negative for cold intolerance, heat intolerance, polydipsia, polyphagia and polyuria.  Genitourinary: Negative for decreased urine volume, dysuria and hematuria.  Musculoskeletal: Positive for arthralgias. Negative for gait problem.  Skin: Positive for rash. Negative for wound.  Allergic/Immunologic: Positive for environmental allergies.  Neurological: Negative for syncope and headaches.  Psychiatric/Behavioral: Negative for behavioral problems and confusion.   Current Outpatient Medications on File Prior to Visit  Medication Sig Dispense Refill  . amLODipine (NORVASC) 5 MG tablet Take 1 tablet (5 mg total) by mouth daily. 90 tablet 3  . aspirin EC 81 MG tablet Take 81 mg by mouth daily.    Marland Kitchen atorvastatin (LIPITOR) 40 MG tablet Take 1 tablet (40 mg total) by mouth daily. 90 tablet 3  . azelastine (ASTELIN) 0.1 % nasal spray Place 2 sprays into both nostrils 2 (two) times daily. Use in each nostril as directed 30 mL 2  . betamethasone dipropionate (DIPROLENE) 0.05 % cream Apply topically daily  as needed. 30 g 2  . FIBER ADULT  GUMMIES PO Take by mouth.    . fluticasone (FLONASE) 50 MCG/ACT nasal spray Place 1 spray into both nostrils 2 (two) times daily as needed. 16 g 3  . fluticasone furoate-vilanterol (BREO ELLIPTA) 200-25 MCG/INH AEPB Inhale 1 puff into the lungs daily. 60 each 5  . guaiFENesin (MUCINEX) 600 MG 12 hr tablet Take 1 tablet (600 mg total) by mouth 2 (two) times daily. 60 tablet 2  . levothyroxine (SYNTHROID) 75 MCG tablet Take 1 tablet (75 mcg total) by mouth daily before breakfast. 90 tablet 3  . loratadine (CLARITIN) 10 MG tablet Take 10 mg by mouth daily.    Marland Kitchen nystatin cream (MYCOSTATIN) Apply 1 application topically 2 (two) times daily. (Patient taking differently: Apply 1 application topically 2 (two) times daily as needed.) 30 g 0  . omeprazole (PRILOSEC) 20 MG capsule Take 1 capsule (20 mg total) by mouth daily. 90 capsule 3  . oxyCODONE-acetaminophen (PERCOCET/ROXICET) 5-325 MG tablet Take 0.5 tablets by mouth every 8 (eight) hours as needed for severe pain. 45 tablet 0  . Probiotic Product (PROBIOTIC ADVANCED PO) Take 1 capsule by mouth daily.    Marland Kitchen Respiratory Therapy Supplies (FLUTTER) DEVI Use as directed 1 each 0   Current Facility-Administered Medications on File Prior to Visit  Medication Dose Route Frequency Provider Last Rate Last Admin  . sodium chloride flush (NS) 0.9 % injection 10 mL  10 mL Intracatheter PRN Curt Bears, MD   10 mL at 06/22/17 1747    Past Medical History:  Diagnosis Date  . Adenocarcinoma of left lung, stage 2 ([place]) 12/27/2016  . Cancer [facility])    Lun cancer: Right Dx 2008, s/p lobectomy. Left Dx 09/2016  . COPD (chronic obstructive pulmonary disease) ([place])   . Encounter for antineoplastic chemotherapy 12/27/2016  . History of radiation therapy 01/24/17-03/08/17   left lung 2 Gy in 30 fractions  . Hyperlipidemia   . Hypertension   . Stroke [facility])     Past Surgical History:  Procedure Laterality Date  . BREAST BIOPSY     several. Denies Hx of breast cancer.   . IR FLUORO GUIDE PORT INSERTION RIGHT  02/04/2017  . IR US GUIDE VASC ACCESS RIGHT  02/04/2017  . THORACOTOMY/LOBECTOMY Right 2008  . TONSILLECTOMY  1960    Allergies  Allergen Reactions  . Anoro Ellipta [Umeclidinium-Vilanterol]     Taste issues    Family History  Problem Relation Age of Onset  . Arthritis Mother   . Lung cancer Mother 36       d.45 from cancer  . Hypertension Father   . Arthritis Father   . Pancreatic cancer Sister 60       d.60 from cancer  . Brain cancer Brother        d.~70  . Colon cancer Brother        d.~70  . Hypertension Brother   . Mental retardation Brother   . Melanoma Brother 79  . Lung cancer Maternal Aunt 66  . Colon cancer Maternal Grandfather   . Depression Neg Hx   . Heart disease Neg Hx     Social History   Socioeconomic History  . Marital status: Single    Spouse name: Not on file  . Number of children: Not on file  . Years of education: Not on file  . Highest education level: Not on file  Occupational History  . Occupation: Biomedical scientist  Tobacco Use  .  Smoking status: Former Smoker    Packs/day: 1.00    Years: 30.00    Pack years: 30.00    Types: Cigarettes    Quit date: 06/21/2005    Years since quitting: 15.4  . Smokeless tobacco: Never Used  Vaping Use  . Vaping Use: Never used  Substance and Sexual Activity  . Alcohol use: No  . Drug use: No  . Sexual activity: Not Currently  Other Topics Concern  . 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    Social Determinants of Health   Financial Resource Strain: Low Risk   . Difficulty of Paying Living Expenses: Not hard at all  Food Insecurity: No Food Insecurity  . Worried About Charity fundraiser in the Last Year: Never true  . Ran Out of Food in the Last Year: Never true  Transportation Needs: No Transportation Needs  . Lack of Transportation (Medical): No  . Lack of Transportation (Non-Medical): No  Physical Activity:  Insufficiently Active  . Days of Exercise per Week: 6 days  . Minutes of Exercise per Session: 20 min  Stress: No Stress Concern Present  . Feeling of Stress : Not at all  Social Connections: Socially Isolated  . Frequency of Communication with Friends and Family: More than three times a week  . Frequency of Social Gatherings with Friends and Family: Three times a week  . Attends Religious Services: Never  . Active Member of Clubs or Organizations: No  . Attends Archivist Meetings: Never  . Marital Status: Never married   Vitals:   11/19/20 1515  BP: 126/80  Pulse: 97  Resp: 20  SpO2: 97%   Body mass index is 27.34 kg/m.  Wt Readings from Last 3 Encounters:  11/19/20 149 lb 8 oz (67.8 kg)  10/27/20 147 lb (66.7 kg)  09/23/20 146 lb 9.6 oz (66.5 kg)   Physical Exam Vitals and nursing note reviewed. Exam conducted with a chaperone present.  Constitutional:      General: She is not in acute distress.    Appearance: She is well-developed.  HENT:     Head: Normocephalic and atraumatic.     Mouth/Throat:     Mouth: Mucous membranes are moist.     Pharynx: Oropharynx is clear. Uvula midline.  Eyes:     Conjunctiva/sclera: Conjunctivae normal.     Pupils: Pupils are equal, round, and reactive to light.  Neck:     Thyroid: No thyromegaly.     Trachea: No tracheal deviation.  Cardiovascular:     Rate and Rhythm: Normal rate and regular rhythm.     Pulses:          Dorsalis pedis pulses are 2+ on the right side and 2+ on the left side.     Heart sounds: No murmur heard.   Pulmonary:     Effort: Pulmonary effort is normal. No respiratory distress.     Breath sounds: Normal breath sounds.  Abdominal:     Palpations: Abdomen is soft. There is no hepatomegaly or mass.     Tenderness: There is no abdominal tenderness.  Genitourinary:    Labia:        Right: No rash, tenderness or lesion.        Left: No rash, tenderness or lesion.      Vagina: No erythema or  tenderness.     Cervix: No cervical motion tenderness, discharge or friability.  Uterus: Not enlarged and not tender.      Adnexa:        Right: No mass, tenderness or fullness.         Left: No mass, tenderness or fullness.       Comments: Breast: No masses, skin abnormalities, or nipple discharge appreciated bilateral. Vaginal atrophic changes. Pap smear collected.  Musculoskeletal:     Right shoulder: No tenderness. Decreased range of motion.     Comments: No major deformity or signs of synovitis appreciated.  Lymphadenopathy:     Cervical: No cervical adenopathy.  Skin:    General: Skin is warm.     Findings: Erythema and rash present. Rash is macular. Rash is not vesicular.          Comments: Erythematous rash under right breast and in skin crease around scar right lateral chest.No induration or tenderness with palpation.  Neurological:     Mental Status: She is alert and oriented to person, place, and time.     Gait: Gait normal.     Deep Tendon Reflexes:     Reflex Scores:      Patellar reflexes are 2+ on the right side and 2+ on the left side.    Comments: Right UE: Hand with fixed contractions and muscle atrophy. Otherwise stable gait, not assisted.  Psychiatric:        Speech: Speech normal.     Comments: Well groomed, good eye contact.    ASSESSMENT AND PLAN:  Ms. Evamarie Raetz was here today annual physical examination.  Orders Placed This Encounter  Procedures  . DG Bone Density  . Lipid panel  . Hemoglobin A1c  . Ambulatory referral to Dermatology    Lab Results  Component Value Date   HGBA1C 6.1 11/19/2020   Lab Results  Component Value Date   CHOL 155 11/19/2020   HDL 51.50 11/19/2020   [place] 75 11/19/2020   TRIG 142.0 11/19/2020   CHOLHDL 3 11/19/2020   Encounter for gynecological examination with abnormal finding We discussed the importance of regular physical activity and healthy diet for prevention of chronic illness and/or  complications. Preventive guidelines reviewed. Vaccination up to date.  Ca++ and vit D supplementation recommended. Next CPE in a year.  Chronic pain disorder Pain is adequately controlled. We have discussed current guidelines in relagard to chronic opioid use. Med contract signed today. [place] controlled subs report reviewed.  Prediabetes A healthy life style for diabetes prevention.  Intertrigo Educated about dx,prognosis,and treatment options. Keep areas clean with antibacteria soap and water. Try to keep areas dry. Lotrisone cream, small amount bid for 14 days at the time. Dermatology referral placed.  -     clotrimazole-betamethasone (LOTRISONE) cream; Apply 1 application topically daily.  Screening for malignant neoplasm of cervix -     PAP [[place]]  Atherosclerosis of aorta (HCC) Continue Atorvastatin 40 mg daily and Aspirin 81 mg daily. Some side effects discussed.  Hyperlipidemia, unspecified hyperlipidemia type LDL goal < 70. Continue Atorvastatin 40 mg daily and low fat diet.  Asymptomatic postmenopausal estrogen deficiency -     DG Bone Density; Future  Adenocarcinoma of left lung, stage 2 ([place]) Completed chemo treatment. Following with oncologist.  Skin rash -     Ambulatory referral to Dermatology   Return in 4 months (on 03/21/2021) for Pain management.   Jarman Litton G. [name], MD  [facility]. [place] office.   Today you have you routine preventive visit. A few things to  remember from today's visit:   Encounter for gynecological examination with abnormal finding  Chronic pain disorder  Prediabetes - Plan: Hemoglobin A1c  Intertrigo  Screening for malignant neoplasm of cervix - Plan: PAP [[place]]  Atherosclerosis of aorta (HCC) - Plan: Lipid panel  Hyperlipidemia, unspecified hyperlipidemia type - Plan: Lipid panel  Asymptomatic postmenopausal estrogen deficiency - Plan: DG Bone Density  Adenocarcinoma of left lung,  stage 2 ([place])  If you need refills please call your pharmacy. Do not use My Chart to request refills or for acute issues that need immediate attention. No changes today.   Please be sure medication list is accurate. If a new problem present, please set up appointment sooner than planned today.  At least 150 minutes of moderate exercise per week, daily brisk walking for 15-30 min is a good exercise option. Healthy diet low in saturated (animal) fats and sweets and consisting of fresh fruits and vegetables, lean meats such as fish and white chicken and whole grains.  These are some of recommendations for screening depending of age and risk factors:  - Vaccines:  Tdap vaccine every 10 years.  Shingles vaccine recommended at age 56, could be given after 64 years of age but not sure about insurance coverage.   Pneumonia vaccines: Pneumovax at 70. Sometimes Pneumovax is giving earlier if history of smoking, lung disease,diabetes,kidney disease among some.  Screening for diabetes at age 35 and every 3 years.  Cervical cancer prevention:  Pap smear starts at 64 years of age and continues periodically until 64 years old in low risk women. Pap smear every 3 years between 44 and 8 years old. Pap smear every 3-5 years between women 72 and older if pap smear negative and HPV screening negative.   -Breast cancer: Mammogram: There is disagreement between experts about when to start screening in low risk asymptomatic female but recent recommendations are to start screening at 47 and not later than 64 years old , every 1-2 years and after 64 yo q 2 years. Screening is recommended until 64 years old but some women can continue screening depending of healthy issues.  Colon cancer screening: Has been recently changed to 64 yo. Insurance may not cover until you are 64 years old. Screening is recommended until 64 years old.  Cholesterol disorder screening at age 54 and every 3 years.N/A  Also  recommended:  1. Dental visit- Brush and floss your teeth twice daily; visit your dentist twice a year. 2. Eye doctor- Get an eye exam at least every 2 years. 3. Helmet use- Always wear a helmet when riding a bicycle, motorcycle, rollerblading or skateboarding. 4. Safe sex- If you may be exposed to sexually transmitted infections, use a condom. 5. Seat belts- Seat belts can save your live; always wear one. 6. Smoke/Carbon Monoxide detectors- These detectors need to be installed on the appropriate level of your home. Replace batteries at least once a year. 7. Skin cancer- When out in the sun please cover up and use sunscreen 15 SPF or higher. 8. Violence- If anyone is threatening or hurting you, please tell your healthcare provider.  9. Drink alcohol in moderation- Limit alcohol intake to one drink or less per day. Never drink and drive. 10. Calcium supplementation 1000 to 1200 mg daily, ideally through your diet.  Vitamin D supplementation 800 units daily.

## 2020-11-20 LAB — HEMOGLOBIN A1C: Hgb A1c MFr Bld: 6.1 % (ref 4.6–6.5)

## 2020-11-21 LAB — CYTOLOGY - PAP
Comment: NEGATIVE
Diagnosis: NEGATIVE
High risk HPV: NEGATIVE

## 2020-11-21 LAB — LIPID PANEL
Cholesterol: 155 mg/dL (ref 0–200)
HDL: 51.5 mg/dL (ref >39.00)
LDL Cholesterol: 75 mg/dL (ref 0–99)
NonHDL: 103.56
Total CHOL/HDL Ratio: 3
Triglycerides: 142 mg/dL (ref 0.0–149.0)
VLDL: 28.4 mg/dL (ref 0.0–40.0)

## 2020-11-23 MED ORDER — OXYCODONE-ACETAMINOPHEN 5-325 MG PO TABS
0.5000 | ORAL_TABLET | Freq: Three times a day (TID) | ORAL | 0 refills | Status: DC | PRN
Start: 2020-11-23 — End: 2021-02-20

## 2020-11-23 MED ORDER — OXYCODONE-ACETAMINOPHEN 5-325 MG PO TABS: 0.5000 | ORAL_TABLET | Freq: Three times a day (TID) | ORAL | 0 refills | Status: DC | PRN

## 2020-11-23 MED ORDER — OXYCODONE-ACETAMINOPHEN 5-325 MG PO TABS
0.5000 | ORAL_TABLET | Freq: Three times a day (TID) | ORAL | 0 refills | Status: DC | PRN
Start: 2020-11-23 — End: 2020-11-23

## 2020-11-27 ENCOUNTER — Other Ambulatory Visit: Payer: Self-pay | Admitting: Family Medicine

## 2020-11-27 DIAGNOSIS — J310 Chronic rhinitis: Secondary | ICD-10-CM

## 2020-12-01 ENCOUNTER — Other Ambulatory Visit: Payer: Self-pay

## 2020-12-01 ENCOUNTER — Encounter: Payer: Self-pay | Admitting: Adult Health

## 2020-12-01 ENCOUNTER — Ambulatory Visit (INDEPENDENT_AMBULATORY_CARE_PROVIDER_SITE_OTHER): Payer: Medicare Other | Admitting: Adult Health

## 2020-12-01 DIAGNOSIS — J449 Chronic obstructive pulmonary disease, unspecified: Secondary | ICD-10-CM | POA: Diagnosis not present

## 2020-12-01 DIAGNOSIS — C3492 Malignant neoplasm of unspecified part of left bronchus or lung: Secondary | ICD-10-CM | POA: Diagnosis not present

## 2020-12-01 NOTE — Progress Notes (Signed)
@Patient  ID: Felicia Herrera, female    DOB: May 04, 1956, 65 y.o.   MRN: [id number]  Chief Complaint  Patient presents with   Follow-up     Referring provider: [name], [name], MD  HPI: 65 year old female former smoker followed for moderate COPD, left lung cancer adenocarcinoma stage IIb status post chemo and radiation in 2018, Previous Pneumonectomy -Right 2007 (lung cancer )   TEST/EVENTS :  CT chest Oct 24, 2020 no evidence of local tumor recurrence, stable radiation fibrosis in the central left upper lobe, no evidence of metastatic disease in the chest, mild to moderate patchy tree-in-bud opacities throughout the left base.  Similar to minimally increased.  12/01/2020 Follow up ; COPD , Lung Cancer  Patient returns for a 4 -month follow-up.  Patient has underlying moderate COPD.  She has a history of lung cancer in 2018 and received chemo and radiation.  Most recent CT chest Oct 24, 2020 showed no evidence of disease recurrence. We reviewed her CT scan. Patient remains on Breo 200 1 puff daily.  Overall says that her breathing is doing well with no flare in cough or wheezing  Trying to stay active. Walking 5 days a week, 1 mile each day. Takes it slow can walk full mile without stopping . Does not drive,uses scat. Lives alone . Does light housework.  Does have questions regarding her flutter valve . Patient education was given with return demonstration .  Uses flutter valve 2 x a day .      Allergies  Allergen Reactions   Anoro Ellipta [Umeclidinium-Vilanterol]     Taste issues    Immunization History  Administered Date(s) Administered   Influenza, Quadrivalent, Recombinant, Inj, Pf 03/12/2018   Influenza,inj,Quad PF,6+ Mos 03/21/2016, 02/22/2017, 03/06/2019, 04/03/2020   PFIZER(Purple Top)SARS-COV-2 Vaccination 09/10/2019, 10/02/2019, 03/18/2020   Tdap 06/01/2019   Zoster Recombinat (Shingrix) 06/01/2019, 08/13/2019    Past Medical History:  Diagnosis Date    Adenocarcinoma of left lung, stage 2 ([place]) 12/27/2016   Cancer ([place])    Lun cancer: Right Dx 2008, s/p lobectomy. Left Dx 09/2016   COPD (chronic obstructive pulmonary disease) ([place])    Encounter for antineoplastic chemotherapy 12/27/2016   History of radiation therapy 01/24/17-03/08/17   left lung 2 Gy in 30 fractions   Hyperlipidemia    Hypertension    Stroke ([place])     Tobacco History: Social History   Tobacco Use  Smoking Status Former   Packs/day: 1.00   Years: 30.00   Pack years: 30.00   Types: Cigarettes   Quit date: 06/21/2005   Years since quitting: 15.4  Smokeless Tobacco Never   Counseling given: Not Answered   Outpatient Medications Prior to Visit  Medication Sig Dispense Refill   amLODipine (NORVASC) 5 MG tablet Take 1 tablet (5 mg total) by mouth daily. 90 tablet 3   aspirin EC 81 MG tablet Take 81 mg by mouth daily.     atorvastatin (LIPITOR) 40 MG tablet Take 1 tablet (40 mg total) by mouth daily. 90 tablet 3   Azelastine HCl 137 MCG/SPRAY SOLN USE 2 SPRAY(S) IN EACH NOSTRIL TWICE DAILY AS DIRECTED 30 mL 0   betamethasone dipropionate (DIPROLENE) 0.05 % cream Apply topically daily as needed. 30 g 2   clotrimazole-betamethasone (LOTRISONE) cream Apply 1 application topically daily. 30 g 1   FIBER ADULT GUMMIES PO Take by mouth.     fluticasone (FLONASE) 50 MCG/ACT nasal spray Place 1 spray into both nostrils 2 (two) times daily  as needed. 16 g 3   fluticasone furoate-vilanterol (BREO ELLIPTA) 200-25 MCG/INH AEPB Inhale 1 puff into the lungs daily. 60 each 5   guaiFENesin (MUCINEX) 600 MG 12 hr tablet Take 1 tablet (600 mg total) by mouth 2 (two) times daily. 60 tablet 2   levothyroxine (SYNTHROID) 75 MCG tablet Take 1 tablet (75 mcg total) by mouth daily before breakfast. 90 tablet 3   loratadine (CLARITIN) 10 MG tablet Take 10 mg by mouth daily.     nystatin cream (MYCOSTATIN) Apply 1 application topically 2 (two) times daily. (Patient taking differently: Apply 1  application topically 2 (two) times daily as needed.) 30 g 0   omeprazole (PRILOSEC) 20 MG capsule Take 1 capsule (20 mg total) by mouth daily. 90 capsule 3   oxyCODONE-acetaminophen (PERCOCET/ROXICET) 5-325 MG tablet Take 0.5 tablets by mouth every 8 (eight) hours as needed for severe pain. 45 tablet 0   Probiotic Product (PROBIOTIC ADVANCED PO) Take 1 capsule by mouth daily.     Respiratory Therapy Supplies (FLUTTER) DEVI Use as directed 1 each 0   Facility-Administered Medications Prior to Visit  Medication Dose Route Frequency Provider Last Rate Last Admin   sodium chloride flush (NS) 0.9 % injection 10 mL  10 mL Intracatheter PRN Curt Bears, MD   10 mL at 06/22/17 1747     Review of Systems:   Constitutional:   No  weight loss, night sweats,  Fevers, chills, fatigue, or  lassitude.  HEENT:   No headaches,  Difficulty swallowing,  Tooth/dental problems, or  Sore throat,                No sneezing, itching, ear ache, nasal congestion, post nasal drip,   CV:  No chest pain,  Orthopnea, PND, swelling in lower extremities, anasarca, dizziness, palpitations, syncope.   GI  No heartburn, indigestion, abdominal pain, nausea, vomiting, diarrhea, change in bowel habits, loss of appetite, bloody stools.   Resp: .  No change in color of mucus.  No wheezing.  No chest wall deformity  Skin: no rash or lesions.  GU: no dysuria, change in color of urine, no urgency or frequency.  No flank pain, no hematuria   MS:  No joint pain or swelling.  No decreased range of motion.  No back pain.    Physical Exam  BP 128/82   Pulse 74   Ht 5\' 2"  (1.575 m)   Wt 148 lb 4 oz (67.2 kg)   SpO2 97%   BMI 27.12 kg/m   GEN: A/Ox3; pleasant , NAD, well nourished    HEENT:  [place]/AT,  NOSE-clear, THROAT-clear, no lesions, no postnasal drip or exudate noted.   NECK:  Supple w/ fair ROM; no JVD; normal carotid impulses w/o bruits; no thyromegaly or nodules palpated; no lymphadenopathy.    RESP   Clear  P & A; w/o, wheezes/ rales/ or rhonchi. no accessory muscle use, no dullness to percussion  CARD:  RRR, no m/r/g, no peripheral edema, pulses intact, no cyanosis or clubbing.  GI:   Soft & nt; nml bowel sounds; no organomegaly or masses detected.   Musco: Warm bil,  Right hand contracture   Neuro: alert, no focal deficits noted.    Skin: Warm, no lesions or rashes    Lab Results:    BNP   Imaging: No results found.    PFT Results Latest Ref Rng & Units 01/17/2017  FVC-Pre L 1.84  FVC-Predicted Pre % 59  FVC-Post L 2.07  FVC-Predicted Post % 67  Pre FEV1/FVC % % 59  Post FEV1/FCV % % 60  FEV1-Pre L 1.08  FEV1-Predicted Pre % 45  FEV1-Post L 1.25  DLCO uncorrected ml/min/mmHg 10.43  DLCO UNC% % 48  DLVA Predicted % 70  TLC L 4.04  TLC % Predicted % 85  RV % Predicted % 102    No results found for: NITRICOXIDE      Assessment & Plan:   Stage 2 moderate COPD by GOLD classification (HCC) Stable on current regimen continue on Breo.  Activity as tolerated Continue with mucociliary clearance.-Flutter valve twice daily. Recent CT chest showed tree-in-bud opacities in the left lower lobe somewhat stable.  No active symptoms.  Continue to follow.  Plan  Patient Instructions  Continue on BREO 1 puff daily  Activity as tolerated.  Flutter valve 2 times a day .  Follow up with Dr. Chase Caller 4-6 months and As needed         Adenocarcinoma of left lung, stage 2 [facility]) Continue follow-up with oncology.  Most recent CT chest in May 2022 showed no evidence of recurrence   I spent   30 minutes dedicated to the care of this patient on the date of this encounter to include pre-visit review of records, face-to-face time with the patient discussing conditions above, post visit ordering of testing, clinical documentation with the electronic health record, making appropriate referrals as documented, and communicating necessary findings to members of the patients  care team.   Rexene Edison, NP 12/01/2020

## 2020-12-01 NOTE — Assessment & Plan Note (Signed)
Continue follow-up with oncology.  Most recent CT chest in May 2022 showed no evidence of recurrence

## 2020-12-01 NOTE — Patient Instructions (Signed)
Continue on BREO 1 puff daily  Activity as tolerated.  Flutter valve 2 times a day .  Follow up with Dr. Chase Caller 4-6 months and As needed

## 2020-12-01 NOTE — Assessment & Plan Note (Addendum)
Stable on current regimen continue on Breo.  Activity as tolerated Continue with mucociliary clearance.-Flutter valve twice daily. Recent CT chest showed tree-in-bud opacities in the left lower lobe somewhat stable.  No active symptoms.  Continue to follow.  Plan  Patient Instructions  Continue on BREO 1 puff daily  Activity as tolerated.  Flutter valve 2 times a day .  Follow up with Dr. Chase Caller 4-6 months and As needed

## 2020-12-08 ENCOUNTER — Ambulatory Visit (INDEPENDENT_AMBULATORY_CARE_PROVIDER_SITE_OTHER)
Admission: RE | Admit: 2020-12-08 | Discharge: 2020-12-08 | Disposition: A | Discharge status: Home / Self Care | Payer: Medicare Other | Source: Ambulatory Visit | Attending: Family Medicine | Admitting: Family Medicine

## 2020-12-08 ENCOUNTER — Other Ambulatory Visit: Payer: Self-pay

## 2020-12-08 DIAGNOSIS — Z780 Asymptomatic menopausal state: Secondary | ICD-10-CM | POA: Diagnosis not present

## 2020-12-09 ENCOUNTER — Telehealth: Payer: Self-pay | Admitting: Family Medicine

## 2020-12-09 NOTE — Telephone Encounter (Signed)
Pt is calling in needing a refill on Rx albuterol (VENTOLIN HFA) 200 MCG  Pharm:  [facility] on [address].  It is not showing on the pts current medication list but she states that she takes it.

## 2020-12-10 NOTE — Telephone Encounter (Signed)
Will defer to Pulmonary since they see patient for her COPD.

## 2020-12-10 NOTE — Telephone Encounter (Signed)
Lmtcb for pt.  Will leave in triage to follow up on.

## 2020-12-11 MED ORDER — ALBUTEROL SULFATE HFA 108 (90 BASE) MCG/ACT IN AERS: 2.0000 | INHALATION_SPRAY | Freq: Four times a day (QID) | RESPIRATORY_TRACT | 6 refills | Status: AC | PRN

## 2020-12-11 NOTE — Addendum Note (Signed)
Addended by: Vanessa Barbara on: 12/11/2020 09:39 AM   Modules accepted: Orders

## 2020-12-11 NOTE — Telephone Encounter (Signed)
According to the OV with MR from 2./09/2020, patient is to continue Albuterol (ventolin hfa) 108 (90 base) 2puffs every six hours as needed for wheezing or sob.  6.7 g with 1 refill.  Script sent in to pharmacy. Called and left detailed message for patient (DPR), letting her know the script had been sent in to her pharmacy ([facility] on [address]).  Advised to call the office with any questions.

## 2020-12-23 ENCOUNTER — Telehealth: Payer: Self-pay | Admitting: Family Medicine

## 2020-12-23 DIAGNOSIS — L820 Inflamed seborrheic keratosis: Secondary | ICD-10-CM | POA: Diagnosis not present

## 2020-12-23 DIAGNOSIS — L304 Erythema intertrigo: Secondary | ICD-10-CM | POA: Diagnosis not present

## 2020-12-23 NOTE — Telephone Encounter (Signed)
I called and spoke with pharmacy. They have an Rx on file for patient. They have scheduled it to be filled for 12/27/20.

## 2020-12-23 NOTE — Telephone Encounter (Signed)
Pt call and stated she need a refill on  oxyCODONE-acetaminophen (PERCOCET/ROXICET) 5-325 MG tablet sent to  [facility], [place] [address] Phone:  [phone]  Fax:  [phone]

## 2021-01-04 ENCOUNTER — Encounter: Payer: Self-pay | Admitting: Family Medicine

## 2021-01-04 DIAGNOSIS — M810 Age-related osteoporosis without current pathological fracture: Secondary | ICD-10-CM | POA: Insufficient documentation

## 2021-01-19 ENCOUNTER — Telehealth: Payer: Self-pay | Admitting: Family Medicine

## 2021-01-19 DIAGNOSIS — G894 Chronic pain syndrome: Secondary | ICD-10-CM

## 2021-01-19 DIAGNOSIS — C3492 Malignant neoplasm of unspecified part of left bronchus or lung: Secondary | ICD-10-CM

## 2021-01-19 NOTE — Telephone Encounter (Signed)
Rx is on file at pharmacy to be filled on 8/8. Will hold encounter in basket until then.

## 2021-01-19 NOTE — Telephone Encounter (Signed)
Pt call and stated she need a refill on oxyCODONE-acetaminophen (PERCOCET/ROXICET) 5-325 MG tablet  sent to  [facility], [place] [address] Phone:  [phone]  Fax:  [phone]

## 2021-01-22 NOTE — Telephone Encounter (Signed)
PT called to advise that she would like her oxyCODONE sent and dated August 6th as they will be picking up their meds at [facility] on August 6th. They also wanted all their other meds dated August 6th as well at [facility].

## 2021-01-26 ENCOUNTER — Other Ambulatory Visit: Payer: Self-pay | Admitting: Family Medicine

## 2021-01-26 DIAGNOSIS — J310 Chronic rhinitis: Secondary | ICD-10-CM

## 2021-02-09 ENCOUNTER — Other Ambulatory Visit: Payer: Self-pay

## 2021-02-09 DIAGNOSIS — R922 Inconclusive mammogram: Secondary | ICD-10-CM | POA: Diagnosis not present

## 2021-02-09 DIAGNOSIS — N6320 Unspecified lump in the left breast, unspecified quadrant: Secondary | ICD-10-CM

## 2021-02-09 DIAGNOSIS — Z1239 Encounter for other screening for malignant neoplasm of breast: Secondary | ICD-10-CM

## 2021-02-09 LAB — HM MAMMOGRAPHY

## 2021-02-10 ENCOUNTER — Encounter: Payer: Self-pay | Admitting: Family Medicine

## 2021-02-20 ENCOUNTER — Other Ambulatory Visit: Payer: Self-pay | Admitting: Family Medicine

## 2021-02-20 DIAGNOSIS — C3492 Malignant neoplasm of unspecified part of left bronchus or lung: Secondary | ICD-10-CM

## 2021-02-20 DIAGNOSIS — G894 Chronic pain syndrome: Secondary | ICD-10-CM

## 2021-02-20 NOTE — Telephone Encounter (Signed)
Patient is requesting a refill for  oxyCODONE-acetaminophen (PERCOCET/ROXICET) 5-325 MG tablet [[id number]].  Patient can be contacted at [phone].  Pharmacy is [facility] [address] [place] - [address]  [place] [place] [address] .  Please advise.

## 2021-02-24 ENCOUNTER — Telehealth: Payer: Self-pay | Admitting: Pharmacist

## 2021-02-24 MED ORDER — OXYCODONE-ACETAMINOPHEN 5-325 MG PO TABS: 0.5000 | ORAL_TABLET | Freq: Three times a day (TID) | ORAL | 0 refills | Status: DC | PRN

## 2021-02-24 NOTE — Chronic Care Management (AMB) (Signed)
    Chronic Care Management Pharmacy Assistant   Name: Felicia Herrera  MRN: [id number] DOB: 1956/03/13  02/25/21 APPOINTMENT REMINDER   Called Felicia Herrera, No answer, left message of appointment on 02/25/21 at 2pm via telephone visit with Jeni Salles, Pharm D. Notified to have all medications, supplements, blood pressure and/or blood sugar logs available during appointment and to return call if need to reschedule.  Care Gaps:  AWV - scheduled for 09/29/21 Pneumonia vaccine - never done Covid-19 vaccine booster 4 - overdue since 06/17/20 Flu vaccine - due  Star Rating Drug:  Atorvastatin 40mg  - last filled on 01/12/21 90DS at [facility]  Any gaps in medications fill history? No.  [place]  Clinical Pharmacist Assistant [phone]

## 2021-02-25 ENCOUNTER — Ambulatory Visit: Payer: Medicare Other

## 2021-02-25 ENCOUNTER — Other Ambulatory Visit: Payer: Self-pay

## 2021-03-23 ENCOUNTER — Other Ambulatory Visit: Payer: Self-pay

## 2021-03-23 DIAGNOSIS — G894 Chronic pain syndrome: Secondary | ICD-10-CM

## 2021-03-23 DIAGNOSIS — C3492 Malignant neoplasm of unspecified part of left bronchus or lung: Secondary | ICD-10-CM

## 2021-03-23 NOTE — Addendum Note (Signed)
Addended by: Rodrigo Ran on: 03/23/2021 10:49 AM   Modules accepted: Orders

## 2021-03-23 NOTE — Telephone Encounter (Signed)
Patient called requesting Rx refill  oxyCODONE-acetaminophen (PERCOCET/ROXICET) 5-325 MG tablet

## 2021-03-24 ENCOUNTER — Telehealth: Payer: Self-pay | Admitting: Pharmacist

## 2021-03-24 MED ORDER — OXYCODONE-ACETAMINOPHEN 5-325 MG PO TABS: 0.5000 | ORAL_TABLET | Freq: Three times a day (TID) | ORAL | 0 refills | Status: DC | PRN

## 2021-03-24 NOTE — Chronic Care Management (AMB) (Signed)
    Chronic Care Management Pharmacy Assistant   Name: Felicia Herrera  MRN: [id number] DOB: 12-15-1955  03/25/21 APPOINTMENT REMINDER  Called patient to remind of appointment on 03/25/21 in office with Jeni Salles Pharm,D. Unable to leave message due to voicemail being full.  Care Gaps:  AWV - scheduled for 09/29/21 Covid-19 vaccine booster 4 - overdue since 06/10/20 Flu vaccine - due  Star Rating Drug:  Atorvastatin 40mg  - last filled on 03/20/21 90DS at [facility]  Any gaps in medications fill history? No.  [place]  Clinical Pharmacist Assistant [phone]

## 2021-03-25 ENCOUNTER — Ambulatory Visit (INDEPENDENT_AMBULATORY_CARE_PROVIDER_SITE_OTHER): Payer: Medicare Other | Admitting: Pharmacist

## 2021-03-25 ENCOUNTER — Other Ambulatory Visit: Payer: Self-pay

## 2021-03-25 DIAGNOSIS — I10 Essential (primary) hypertension: Secondary | ICD-10-CM

## 2021-03-25 DIAGNOSIS — Z8709 Personal history of other diseases of the respiratory system: Secondary | ICD-10-CM

## 2021-03-25 NOTE — Patient Instructions (Addendum)
Look for regular dextromethorphan for coughing Look for calcium citrate the next time you get calcium Get a notebook to jot down your thoughts before bed   Jeni Salles, PharmD, [place] [facility] [place]

## 2021-03-25 NOTE — Progress Notes (Signed)
Chronic Care Management Pharmacy Note  04/10/2021 Name:  Felicia Herrera MRN:  [id number] DOB:  June 26, 1955  Summary: TSH not optimal for osteoporosis Pt is having significant flatulence  Recommendations/Changes made from today's visit: -Recommended switching to calcium citrate for better absorption -Recommended trial of dextromethorphan without guaifenesin for coughing -Recommend against overuse of cough drops with menthol  Plan: Follow up BP assessment in 2 months  Subjective: Felicia Herrera is an 65 y.o. year old female who is a primary patient of [name], Malka So, MD.  The CCM team was consulted for assistance with disease management and care coordination needs.    Engaged with patient face to face for follow up visit in response to provider referral for pharmacy case management and/or care coordination services.   Consent to Services:  The patient was given information about Chronic Care Management services, agreed to services, and gave verbal consent prior to initiation of services.  Please see initial visit note for detailed documentation.   Patient Care Team: [name], [name], MD as PCP - General (Family Medicine) Viona Gilmore, [facility] as Pharmacist (Pharmacist) Brand Males, MD as Consulting Physician (Pulmonary Disease)  Recent office visits: 11/19/20 Betty [name], MD: Patient presented for physical exam. Ordered DEXA. Referred to dermatology.  09/23/20 Charlott Rakes, LPN: Patient presented for AWV.   Recent consult visits: 12/01/20 Rexene Edison, NP (pulmonary): Patient presented for COPD follow up.  10/27/20 Curt Bears, MD (oncology): Patient presented for lung cancer follow up.  09/18/20 Brand Males, MD (pulmonary): Telephone encounter - patient instructed to stop Spiriva and start Breo.  07/25/20 Brand Males, MD (pulmonary): Patient presented for COPD follow up. Prescribed Spiriva 2 puffs daily.  Hospital visits: None in previous 6  months  Objective:  Lab Results  Component Value Date   CREATININE 0.68 10/24/2020   BUN 11 10/24/2020   GFR 77.38 07/15/2020   GFRNONAA >60 10/24/2020   GFRAA >60 10/22/2019   NA 142 10/24/2020   K 3.7 10/24/2020   CALCIUM 8.8 (L) 10/24/2020   CO2 27 10/24/2020    Lab Results  Component Value Date/Time   HGBA1C 6.1 11/19/2020 04:05 PM   HGBA1C 6.1 (A) 02/12/2019 05:27 PM   HGBA1C 6.0 10/18/2017 12:13 PM   GFR 77.38 07/15/2020 07:14 AM   GFR 80.21 11/04/2016 12:55 PM    Last diabetic Eye exam: No results found for: HMDIABEYEEXA  Last diabetic Foot exam: No results found for: HMDIABFOOTEX   Lab Results  Component Value Date   CHOL 155 11/19/2020   HDL 51.50 11/19/2020   LDLCALC 75 11/19/2020   TRIG 142.0 11/19/2020   CHOLHDL 3 11/19/2020    Hepatic Function Latest Ref Rng & Units 10/24/2020 07/15/2020 04/25/2020  Total Protein 6.5 - 8.1 g/dL 7.0 7.7 7.0  Albumin 3.5 - 5.0 g/dL 3.6 4.3 3.4(L)  AST 15 - 41 U/L 19 16 12(L)  ALT 0 - 44 U/L $Remo'26 11 10  'yGuWN$ Alk Phosphatase 38 - 126 U/L 101 104 101  Total Bilirubin 0.3 - 1.2 mg/dL 0.4 0.4 0.2(L)    Lab Results  Component Value Date/Time   TSH 0.78 07/02/2020 03:37 PM   TSH 3.76 06/01/2019 07:35 AM    CBC Latest Ref Rng & Units 10/24/2020 04/25/2020 10/22/2019  WBC 4.0 - 10.5 K/uL 8.0 6.2 6.8  Hemoglobin 12.0 - 15.0 g/dL 11.7(L) 9.5(L) 11.8(L)  Hematocrit 36.0 - 46.0 % 36.2 31.3(L) 39.3  Platelets 150 - 400 K/uL 379 437(H) 347    No results  found for: VD25OH  Clinical ASCVD: No  The 10-year ASCVD risk score (Arnett DK, et al., 2019) is: 5.3%   Values used to calculate the score:     Age: 89 years     Sex: Female     Is Non-Hispanic African American: No     Diabetic: No     Tobacco smoker: No     Systolic Blood Pressure: 161 mmHg     Is BP treated: Yes     HDL Cholesterol: 51.5 mg/dL     Total Cholesterol: 155 mg/dL    Depression screen [facility] 2/9 09/23/2020 02/18/2020 07/13/2017  Decreased Interest 0 0 0  Down, Depressed,  Hopeless 0 0 0  PHQ - 2 Score 0 0 0  Some recent data might be hidden     Social History   Tobacco Use  Smoking Status Former   Packs/day: 1.00   Years: 30.00   Pack years: 30.00   Types: Cigarettes   Quit date: 06/21/2005   Years since quitting: 15.8  Smokeless Tobacco Never   BP Readings from Last 3 Encounters:  12/01/20 128/82  11/19/20 126/80  10/27/20 139/77   Pulse Readings from Last 3 Encounters:  12/01/20 74  11/19/20 97  10/27/20 80   Wt Readings from Last 3 Encounters:  12/01/20 148 lb 4 oz (67.2 kg)  11/19/20 149 lb 8 oz (67.8 kg)  10/27/20 147 lb (66.7 kg)    Assessment/Interventions: Review of patient past medical history, allergies, medications, health status, including review of consultants reports, laboratory and other test data, was performed as part of comprehensive evaluation and provision of chronic care management services.   SDOH:  (Social Determinants of Health) assessments and interventions performed: No   CCM Care Plan  Allergies  Allergen Reactions   Anoro Ellipta [Umeclidinium-Vilanterol]     Taste issues    Medications Reviewed Today     Reviewed by Jacqualin Combes, CMA (Certified Medical Assistant) on 12/01/20 at 1203  Med List Status: <None>   Medication Order Taking? Sig Documenting Provider Last Dose Status Informant  amLODipine (NORVASC) 5 MG tablet [id number] Yes Take 1 tablet (5 mg total) by mouth daily. [name], [name], MD Taking Active   aspirin EC 81 MG tablet [id number] Yes Take 81 mg by mouth daily. [provider] Taking Active   atorvastatin (LIPITOR) 40 MG tablet [id number] Yes Take 1 tablet (40 mg total) by mouth daily. [name], [name], MD Taking Active   Azelastine HCl 137 MCG/SPRAY SOLN [id number] Yes USE 2 SPRAY(S) IN EACH NOSTRIL TWICE DAILY AS DIRECTED [name], [name], MD Taking Active   betamethasone dipropionate (DIPROLENE) 0.05 % cream [id number] Yes Apply topically daily as needed. [name], [name], MD  Taking Active   clotrimazole-betamethasone Donalynn Furlong) cream [id number] Yes Apply 1 application topically daily. [name], [name], MD Taking Active   FIBER ADULT GUMMIES PO [id number] Yes Take by mouth. [provider] Taking Active   fluticasone (FLONASE) 50 MCG/ACT nasal spray [id number] Yes Place 1 spray into both nostrils 2 (two) times daily as needed. [name], [name], MD Taking Active   fluticasone furoate-vilanterol (BREO ELLIPTA) 200-25 MCG/INH AEPB [id number] Yes Inhale 1 puff into the lungs daily. Brand Males, MD Taking Active   guaiFENesin (MUCINEX) 600 MG 12 hr tablet [id number] Yes Take 1 tablet (600 mg total) by mouth 2 (two) times daily. Martyn Ehrich, NP Taking Active   levothyroxine (SYNTHROID) 75 MCG tablet [id number] Yes Take 1 tablet (75 mcg total) by  mouth daily before breakfast. [name], [name], MD Taking Active   loratadine (CLARITIN) 10 MG tablet [id number] Yes Take 10 mg by mouth daily. [provider] Taking Active   nystatin cream (MYCOSTATIN) [id number] Yes Apply 1 application topically 2 (two) times daily.  Patient taking differently: Apply 1 application topically 2 (two) times daily as needed.   Maryanna Shape, NP Taking Active   omeprazole (PRILOSEC) 20 MG capsule [id number] Yes Take 1 capsule (20 mg total) by mouth daily. [name], [name], MD Taking Active   oxyCODONE-acetaminophen (PERCOCET/ROXICET) 5-325 MG tablet [id number] Yes Take 0.5 tablets by mouth every 8 (eight) hours as needed for severe pain. [name], [name], MD Taking Active   Probiotic Product (PROBIOTIC ADVANCED PO) [id number] Yes Take 1 capsule by mouth daily. 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   Genetic testing 03/09/2017   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   Stage 2 moderate COPD by GOLD classification ([place]) 02/22/2017   Adenocarcinoma of left lung, stage 2 ([place]) 12/27/2016   Encounter for antineoplastic chemotherapy 12/27/2016   Goals of care, counseling/discussion 12/27/2016   GERD (gastroesophageal reflux disease) 12/17/2016   Hypertension, essential, benign 12/17/2016   History of COPD 12/09/2016   History of lung cancer 12/09/2016   Lung cancer ([place]) 11/04/2016   Primary hypothyroidism 11/04/2016    Immunization History  Administered Date(s) Administered   Influenza, Quadrivalent, Recombinant, Inj, Pf 03/12/2018   Influenza,inj,Quad PF,6+ Mos 03/21/2016, 02/22/2017, 03/06/2019, 04/03/2020   PFIZER(Purple Top)SARS-COV-2 Vaccination 09/10/2019, 10/02/2019, 03/18/2020   Tdap 06/01/2019   Zoster Recombinat (Shingrix) 06/01/2019, 08/13/2019   Patient's biggest concern today is that she has been sleeping terribly. She usually gets about 8-9 hours every night but lately she has been going to bed at 11pm and waking up at 12:30-1am. She doesn't feel very calm going to sleep and rent has gone up which has made her more anxious. She was concerned that the oxycodone was keeping her up at night and recommended spacing out the doses to be closer to every 8 hours.  Patient is also having a lot more flatulence lately. She tried gas-x and beano and neither helped. She has not been eating differently but has been eating somewhat healthier lately. She is following more of a mediterranean diet with more veggies, a lot more beans and less dairy.  Conditions to be  addressed/monitored:  Hypertension, Coronary Artery Disease, COPD, Hypothyroidism, Osteoporosis, and lung cancer  Conditions addressed this visit: COPD, osteoporosis  Care Plan : CCM Pharmacy Care Plan  Updates made by Viona Gilmore, [place] since 04/10/2021 12:00 AM     Problem: Problem: Hypertension, Coronary Artery Disease, COPD, Hypothyroidism and lung cancer      Long-Range Goal: Patient-Specific Goal   Start Date: 08/01/2020  Expected End Date: 08/01/2021  Recent Progress: On track  Priority: High  Note:   Current Barriers:  Unable to independently monitor therapeutic efficacy Unable  to self administer medications as prescribed  Pharmacist Clinical Goal(s):  Patient will achieve adherence to monitoring guidelines and medication adherence to achieve therapeutic efficacy achieve control of blood pressure as evidenced by home blood pressure monitoring  through collaboration with PharmD and provider.   Interventions: 1:1 collaboration with [name], [name], MD regarding development and update of comprehensive plan of care as evidenced by provider attestation and co-signature Inter-disciplinary care team collaboration (see longitudinal plan of care) Comprehensive medication review performed; medication list updated in electronic medical record  Hypertension (BP goal <130/80) -Controlled -Current treatment: Amlodipine 5 mg 1/2 tablet daily -Medications previously tried: none  -Current home readings: could not provide -Current dietary habits: did not discuss -Current exercise habits: did not discuss -Denies hypotensive/hypertensive symptoms -Educated on Importance of home blood pressure monitoring; Proper BP monitoring technique; -Counseled to monitor BP at home weekly, document, and provide log at future appointments -Recommended to continue current medication -Advised on proper use of blood pressure cuff and compared office to home BP readings  Hyperlipidemia/CAD: (LDL  goal < 70) -Controlled -Current treatment: Aspirin 81 mg daily Atorvastatin 40 mg daily -Medications previously tried: none  -Current dietary patterns: did not discuss -Current exercise habits: did not discuss -Educated on Cholesterol goals;  -Recommended to continue current medication  COPD (Goal: control symptoms and prevent exacerbations) -Uncontrolled -Current treatment  Ventolin HFA 2 puffs every 6 hours as needed  Breo ellipta 1 puff daily -Medications previously tried: Spiriva (difficult to use), Breo (recurrent pneumonia), Anoro (left a weird taste in her mouth) -Gold Grade: Gold 2 (FEV1 50-79%) -Current COPD Classification:  B (high sx, <2 exacerbations/yr) -CAT score: 18 -Pulmonary function testing: 01/17/17 -Exacerbations requiring treatment in last 6 months: yes -Patient denies consistent use of maintenance inhaler -Frequency of rescue inhaler use: daily -Counseled on Proper inhaler technique; Benefits of consistent maintenance inhaler use Differences between maintenance and rescue inhalers -Recommended to continue current medication  Hypothyroidism (Goal: TSH 2.5-4.5 with osteoporosis diagnosis) -Not ideally controlled -Current treatment  Levothyroxine 75 mcg daily  -Medications previously tried: none  -Recommended to continue current medication Recommended dose decrease to target higher TSH due to osteoporosis.  Osteoporosis (Goal prevent fractures) -Uncontrolled -Last DEXA Scan: 12/08/20   T-Score femoral neck: -2.9  T-Score total hip: n/a  T-Score lumbar spine: -1.2  T-Score forearm radius: n/a  10-year probability of major osteoporotic fracture: n/a  10-year probability of hip fracture: n/a -Patient is a candidate for pharmacologic treatment due to T-Score < -2.5 in femoral neck -Current treatment  Calcium Vitamin D -Medications previously tried: none  -Recommend [phone] units of vitamin D daily. Recommend 1200 mg of calcium daily from dietary and  supplemental sources. Recommend weight-bearing and muscle strengthening exercises for building and maintaining bone density. -Recommended switching to calcium citrate for better absorption.  Allergic rhinitis (Goal: minimize symptoms of allergies) -Uncontrolled -Current treatment  Flonase 50 mcg/act 1 spray twice daily  - not taking Azelastine 0.1% nasal spray 2 sprays in both nostrils twice daily Saline nasal spray as needed Mucinex 600 mg tablets as needed Benadryl as needed for insect bites -Medications previously tried: several -Recommended mucinex without DM to try for congestion and if this does not help, try Zyrtec or Claritin over the counter Educated on the differences between the nasal sprays and various over the counter cold and cough products  GERD (Goal: minimize symptoms of heartburn/acid reflux) -Controlled -Current treatment  Omeprazole 20 mg daily -Medications previously tried: none  -Recommended to continue current medication  Pain (Goal: minimize symptoms of pain) -Controlled -Current treatment  Oxycodone 5-325 mg 1/2 tablet every 6 hours as needed -Medications previously tried: none  -Recommended to continue current medication Counseled on limiting use    Health Maintenance -Vaccine gaps: none -Current therapy:  Betamethasone 0.05% cream  Clotrimazole 1% cream  Docusate 100 mg BID (stopped diarrhea)  Nystatin cream Prochlorperazine 10 mg q6hr Probiotic 1 tablet daily -Educated on Supplements may interfere with prescription drugs -Patient is satisfied with current therapy and denies issues -Recommended to continue current medication  Patient Goals/Self-Care Activities Patient will:  - take medications as prescribed check blood pressure weekly, document, and provide at future appointments  Follow Up Plan: Face to Face appointment with care management team member scheduled for:  4 months       Medication Assistance: None required.  Patient  affirms current coverage meets needs.  Compliance/Adherence/Medication fill history: Care Gaps: COVID booster, influenza, shingrix  Star-Rating Drugs: Atorvastatin 40mg  - last filled on 03/20/21 90DS at [facility]  Patient's preferred pharmacy is:  [facility] [address], [place] - [place] [address] [place] [place] [address] Phone: [phone] Fax: [phone]  Uses pill box? Yes Pt endorses 99% compliance  We discussed: Current pharmacy is preferred with insurance plan and patient is satisfied with pharmacy services Patient decided to: Continue current medication management strategy  Care Plan and Follow Up Patient Decision:  Patient agrees to Care Plan and Follow-up.  Plan: Face to Face appointment with care management team member scheduled for: 4 months  Jeni Salles, PharmD [place] Pharmacist [place] at [place] [phone]

## 2021-03-26 ENCOUNTER — Telehealth: Payer: Self-pay | Admitting: Internal Medicine

## 2021-03-26 ENCOUNTER — Other Ambulatory Visit: Payer: Self-pay | Admitting: Internal Medicine

## 2021-03-26 MED ORDER — FLUTICASONE FUROATE-VILANTEROL 200-25 MCG/INH IN AEPB: 1.0000 | INHALATION_SPRAY | Freq: Every day | RESPIRATORY_TRACT | 0 refills | Status: DC

## 2021-03-26 NOTE — Telephone Encounter (Signed)
Refill has been sent to the pharmacy and nothing further is needed.

## 2021-04-13 ENCOUNTER — Ambulatory Visit: Payer: Medicare Other | Admitting: Internal Medicine

## 2021-04-13 ENCOUNTER — Encounter: Payer: Self-pay | Admitting: Acute Care

## 2021-04-13 ENCOUNTER — Other Ambulatory Visit: Payer: Self-pay

## 2021-04-13 ENCOUNTER — Ambulatory Visit (INDEPENDENT_AMBULATORY_CARE_PROVIDER_SITE_OTHER): Payer: Medicare Other | Admitting: Acute Care

## 2021-04-13 VITALS — BP 122/76 | HR 79 | Temp 98.3°F | Ht 62.0 in | Wt 157.0 lb

## 2021-04-13 DIAGNOSIS — L0390 Cellulitis, unspecified: Secondary | ICD-10-CM

## 2021-04-13 DIAGNOSIS — C3492 Malignant neoplasm of unspecified part of left bronchus or lung: Secondary | ICD-10-CM

## 2021-04-13 DIAGNOSIS — J449 Chronic obstructive pulmonary disease, unspecified: Secondary | ICD-10-CM | POA: Diagnosis not present

## 2021-04-13 MED ORDER — DOXYCYCLINE HYCLATE 100 MG PO TABS: 100.0000 mg | ORAL_TABLET | Freq: Two times a day (BID) | ORAL | 0 refills | Status: DC

## 2021-04-13 NOTE — Progress Notes (Signed)
History of Present Illness Felicia Herrera is a 65 y.o. female former smoker with with Stage 2 moderate COPD., left lung cancer adenocarcinoma stage IIb status post chemo and radiation in 2018, Previous Pneumonectomy -Right 2007 (lung cancer ) . She is followed by Dr. Chase Caller.  04/13/2021 Pt. Presents for follow up. She was on Dr. Golden Pop schedule and was a late arrival, so he had patient placed on my schedule. She was last seen in the office 11/2020 by Dr. Kimber Relic . CT Chest 10/2020 showed no recurrence of disease.  Pt.presents for follow up. She is using Breo 200 I puff daily. She states she has been doing well. She has been using her albuterol about once every 2 days for wheezing. This made the wheezing  resolve. She did have her pneumonia vaccine 04/10/2021. She as a reddened hot and hard injection site to her left upper arm. .She states this is tender to touch. She states this started hurting Sunday morning. She has not had any fever. She has not used any warm compresses.This does look like a cellulitis reaction.   She was very tired for the day after her injection. She states she feels better now.  She has a labs scheduled for 04/27/2021 and an appointment with Dr. Julien Nordmann after . She will have surveillance CT's per Dr. Julien Nordmann.     Test Results: CT chest Oct 24, 2020 no evidence of local tumor recurrence, stable radiation fibrosis in the central left upper lobe, no evidence of metastatic disease in the chest, mild to moderate patchy tree-in-bud opacities throughout the left base.  Similar to minimally increased.  CBC Latest Ref Rng & Units 10/24/2020 04/25/2020 10/22/2019  WBC 4.0 - 10.5 K/uL 8.0 6.2 6.8  Hemoglobin 12.0 - 15.0 g/dL 11.7(L) 9.5(L) 11.8(L)  Hematocrit 36.0 - 46.0 % 36.2 31.3(L) 39.3  Platelets 150 - 400 K/uL 379 437(H) 347    BMP Latest Ref Rng & Units 10/24/2020 07/15/2020 04/25/2020  Glucose 70 - 99 mg/dL 104(H) 106(H) 98  BUN 8 - 23 mg/dL 11 16 13   Creatinine 0.44 -  1.00 mg/dL 0.68 0.81 0.70  Sodium 135 - 145 mmol/L 142 139 139  Potassium 3.5 - 5.1 mmol/L 3.7 3.4(L) 3.7  Chloride 98 - 111 mmol/L 107 101 105  CO2 22 - 32 mmol/L 27 29 28   Calcium 8.9 - 10.3 mg/dL 8.8(L) 9.5 8.7(L)    BNP No results found for: BNP  ProBNP    Component Value Date/Time   PROBNP 249.0 (H) 03/05/2019 1237    PFT    Component Value Date/Time   FEV1PRE 1.08 01/17/2017 0951   FEV1POST 1.25 01/17/2017 0951   FVCPRE 1.84 01/17/2017 0951   FVCPOST 2.07 01/17/2017 0951   TLC 4.04 01/17/2017 0951   DLCOUNC 10.43 01/17/2017 0951   PREFEV1FVCRT 59 01/17/2017 0951   PSTFEV1FVCRT 60 01/17/2017 0951    No results found.   Past medical hx Past Medical History:  Diagnosis Date   Adenocarcinoma of left lung, stage 2 ([place]) 12/27/2016   Cancer ([place])    Lun cancer: Right Dx 2008, s/p lobectomy. Left Dx 09/2016   COPD (chronic obstructive pulmonary disease) ([place])    Encounter for antineoplastic chemotherapy 12/27/2016   History of radiation therapy 01/24/17-03/08/17   left lung 2 Gy in 30 fractions   Hyperlipidemia    Hypertension    Stroke [facility])      Social History   Tobacco Use   Smoking status: Former    Packs/day: 1.00  Years: 30.00    Pack years: 30.00    Types: Cigarettes    Quit date: 06/21/2005    Years since quitting: 15.8   Smokeless tobacco: Never  Vaping Use   Vaping Use: Never used  Substance Use Topics   Alcohol use: No   Drug use: No    [name] reports that she quit smoking about 15 years ago. Her smoking use included cigarettes. She has a 30.00 pack-year smoking history. She has never used smokeless tobacco. She reports that she does not drink alcohol and does not use drugs.  Tobacco Cessation: Former smoker    Past surgical hx, Family hx, Social hx all reviewed.  Current Outpatient Medications on File Prior to Visit  Medication Sig   albuterol (VENTOLIN HFA) 108 (90 Base) MCG/ACT inhaler Inhale 2 puffs into the lungs every 6 (six)  hours as needed for wheezing or shortness of breath.   amLODipine (NORVASC) 5 MG tablet Take 1 tablet (5 mg total) by mouth daily.   aspirin EC 81 MG tablet Take 81 mg by mouth daily.   atorvastatin (LIPITOR) 40 MG tablet Take 1 tablet (40 mg total) by mouth daily.   Azelastine HCl 137 MCG/SPRAY SOLN USE 2 SPRAY(S) IN EACH NOSTRIL TWICE DAILY AS DIRECTED   betamethasone dipropionate (DIPROLENE) 0.05 % cream Apply topically daily as needed.   clotrimazole-betamethasone (LOTRISONE) cream Apply 1 application topically daily.   FIBER ADULT GUMMIES PO Take by mouth.   fluticasone (FLONASE) 50 MCG/ACT nasal spray Place 1 spray into both nostrils 2 (two) times daily as needed.   fluticasone furoate-vilanterol (BREO ELLIPTA) 200-25 MCG/INH AEPB Inhale 1 puff into the lungs daily.   guaiFENesin (MUCINEX) 600 MG 12 hr tablet Take 1 tablet (600 mg total) by mouth 2 (two) times daily. (Patient taking differently: Take 1,200 mg by mouth 2 (two) times daily.)   levothyroxine (SYNTHROID) 75 MCG tablet Take 1 tablet (75 mcg total) by mouth daily before breakfast.   loratadine (CLARITIN) 10 MG tablet Take 10 mg by mouth daily.   nystatin cream (MYCOSTATIN) Apply 1 application topically 2 (two) times daily. (Patient taking differently: Apply 1 application topically 2 (two) times daily as needed.)   omeprazole (PRILOSEC) 20 MG capsule Take 1 capsule (20 mg total) by mouth daily.   oxyCODONE-acetaminophen (PERCOCET/ROXICET) 5-325 MG tablet Take 0.5 tablets by mouth every 8 (eight) hours as needed for severe pain.   Probiotic Product (PROBIOTIC ADVANCED PO) Take 1 capsule by mouth daily.   Respiratory Therapy Supplies (FLUTTER) DEVI Use as directed   Current Facility-Administered Medications on File Prior to Visit  Medication   sodium chloride flush (NS) 0.9 % injection 10 mL     Allergies  Allergen Reactions   Anoro Ellipta [Umeclidinium-Vilanterol]     Taste issues    Review Of Systems:  Constitutional:    No  weight loss, night sweats,  Fevers, chills, fatigue, or  lassitude.  HEENT:   No headaches,  Difficulty swallowing,  Tooth/dental problems, or  Sore throat,                No sneezing, itching, ear ache, nasal congestion, post nasal drip,   CV:  No chest pain,  Orthopnea, PND, swelling in lower extremities, anasarca, dizziness, palpitations, syncope.   GI  No heartburn, indigestion, abdominal pain, nausea, vomiting, diarrhea, change in bowel habits, loss of appetite, bloody stools.   Resp: + shortness of breath with exertion less at rest.  No excess mucus, no productive  cough,  No non-productive cough,  No coughing up of blood.  No change in color of mucus.  No wheezing.  No chest wall deformity  Skin: + left arm  rash with swelling  no lesions.  GU: no dysuria, change in color of urine, no urgency or frequency.  No flank pain, no hematuria   MS:  No joint pain or swelling.  No decreased range of motion.  No back pain.  Psych:  No change in mood or affect. No depression or anxiety.  No memory loss.   Vital Signs BP 122/76 (BP Location: Left Arm, Cuff Size: Normal)   Pulse 79   Temp 98.3 F (36.8 C) (Oral)   Ht 5\' 2"  (1.575 m)   Wt 157 lb (71.2 kg)   SpO2 96%   BMI 28.72 kg/m    Physical Exam:  General- No distress,  A&Ox3, pleasant ENT: No sinus tenderness, TM clear, pale nasal mucosa, no oral exudate,no post nasal drip, no LAN Cardiac: S1, S2, regular rate and rhythm, no murmur Chest: No wheeze/ rales/ dullness; no accessory muscle use, no nasal flaring, no sternal retractions Abd.: Soft Non-tender, ND, BS +, Body mass index is 28.72 kg/m.  Ext: No clubbing cyanosis, no edema Neuro:  normal strength, MAE x 4, A&O x 3 Skin: No rashes, warm and dry, LUE cellulitis from pneumococcal injection  Psych: normal mood and behavior   Assessment/Plan COPD Stable interval Plan Continue Breo 1 puff daily. Continue Albuterol as needed for shortness of breath or  wheezing Follow up with Dr. Earlie Server as is scheduled. Please call us if you need [place] sooner.  Follow up with Dr. Chase Caller in 6 months.  Please contact office for sooner follow up if symptoms do not improve or worsen or seek emergency care    Vaccine related cellulitis Plan We will send in a prescription for Doxycycline 100 mg, Twice daily.  Take with a full glass of water.  If in the sun, wear sunblock.  This is to make sure the area on your arm where you got the pneumococcal vaccine does not get any worse.  Also warm compresses to the are will help.  Tylenol or ibuprofen for pain. Call if this gets worse instead of better.  Left lung cancer adenocarcinoma stage IIb status post chemo and radiation in 2018,  Previous Pneumonectomy -Right 2007 (lung cancer ) .  Plan Follow up with Dr. Julien Nordmann Surveillance screening per Dr. Julien Nordmann.   I spent 40 minutes dedicated to the care of this patient on the date of this encounter to include pre-visit review of records, face-to-face time with the patient discussing conditions above, post visit ordering of testing, clinical documentation with the electronic health record, making appropriate referrals as documented, and communicating necessary information to the patient's healthcare team.    Magdalen Spatz, NP 04/13/2021  11:54 AM

## 2021-04-13 NOTE — Patient Instructions (Signed)
It is good to see you today. We will send in a prescription for Doxycycline 100 mg, Twice daily.  Take with a full glass of water.  If in the sun, wear sunblock.  This is to make sure the area on your arm where you got the pneumococcal vaccine does not get any worse.  Also warm compresses to the are will help.  Tylenol or ibuprofen for pain. Continue Breo 1 puff daily. Continue Albuterol as needed for shortness of breath or wheezing Follow up with Dr. Earlie Server as is scheduled. Please call us if you need [place] sooner.  If arm get worse not better call to be seen.  Follow up with Dr. Chase Caller in 6 months.  Please contact office for sooner follow up if symptoms do not improve or worsen or seek emergency care

## 2021-04-20 DIAGNOSIS — I10 Essential (primary) hypertension: Secondary | ICD-10-CM | POA: Diagnosis not present

## 2021-04-22 ENCOUNTER — Other Ambulatory Visit: Payer: Self-pay | Admitting: Family Medicine

## 2021-04-22 DIAGNOSIS — G894 Chronic pain syndrome: Secondary | ICD-10-CM

## 2021-04-22 DIAGNOSIS — C3492 Malignant neoplasm of unspecified part of left bronchus or lung: Secondary | ICD-10-CM

## 2021-04-22 NOTE — Telephone Encounter (Signed)
Pt is calling and needs a refill on oxyCODONE-acetaminophen (PERCOCET/ROXICET) 5-325 MG tablet [facility] [address], [place] [address] [place], [place] [address]

## 2021-04-23 ENCOUNTER — Telehealth: Payer: Self-pay | Admitting: Pharmacist

## 2021-04-23 NOTE — Telephone Encounter (Signed)
Patient called back. Per PCP discussion, recommended for her to try omeprazole twice daily before meals. Patient is in agreement with the plan and verbalized her understanding.  Scheduled follow up with PCP on November 14th as requested.

## 2021-04-23 NOTE — Telephone Encounter (Signed)
Called patient to follow up on discussion with PCP from CCM visit. Left voicemail requesting a call back.

## 2021-04-23 NOTE — Telephone Encounter (Signed)
-----   Message from [name] [name], MD sent at 04/22/2021 10:21 PM EDT ----- Regarding: RE: Omeprazole question That is fine. Can you please remind her she is overdue for follow up. Thanks, BJ ----- Message ----- From: Viona Gilmore, [facility] Sent: 04/10/2021   2:59 PM EDT To: [name] [name], MD Subject: Omeprazole question                            Hi,  When I spoke with Malachy Mood, she was having a lot more gas than usual and some burping and wondered if it was related to her acid reflux. Would you be ok for her to trial for a couple of weeks on twice daily of omeprazole to see if that helps? She has tried gas-x and beano with no relief.  Let me know!  Thanks, Maddie

## 2021-04-24 ENCOUNTER — Other Ambulatory Visit: Payer: Self-pay | Admitting: Internal Medicine

## 2021-04-24 MED ORDER — OXYCODONE-ACETAMINOPHEN 5-325 MG PO TABS: 0.5000 | ORAL_TABLET | Freq: Three times a day (TID) | ORAL | 0 refills | Status: DC | PRN

## 2021-04-27 ENCOUNTER — Ambulatory Visit (HOSPITAL_COMMUNITY)
Admission: RE | Admit: 2021-04-27 | Discharge: 2021-04-27 | Disposition: A | Discharge status: Home / Self Care | Payer: Medicare Other | Source: Ambulatory Visit | Attending: Internal Medicine | Admitting: Internal Medicine

## 2021-04-27 ENCOUNTER — Encounter (HOSPITAL_COMMUNITY): Payer: Self-pay

## 2021-04-27 ENCOUNTER — Inpatient Hospital Stay: Payer: Medicare Other | Attending: Internal Medicine

## 2021-04-27 ENCOUNTER — Other Ambulatory Visit: Payer: Self-pay

## 2021-04-27 DIAGNOSIS — C3490 Malignant neoplasm of unspecified part of unspecified bronchus or lung: Secondary | ICD-10-CM | POA: Diagnosis not present

## 2021-04-27 DIAGNOSIS — I700 Atherosclerosis of aorta: Secondary | ICD-10-CM | POA: Diagnosis not present

## 2021-04-27 DIAGNOSIS — J439 Emphysema, unspecified: Secondary | ICD-10-CM | POA: Diagnosis not present

## 2021-04-27 LAB — CBC WITH DIFFERENTIAL (CANCER CENTER ONLY)
Abs Immature Granulocytes: 0.02 10*3/uL (ref 0.00–0.07)
Basophils Absolute: 0.1 10*3/uL (ref 0.0–0.1)
Basophils Relative: 0 %
Eosinophils Absolute: 0.1 10*3/uL (ref 0.0–0.5)
Eosinophils Relative: 1 %
HCT: 33.7 % — ABNORMAL LOW (ref 36.0–46.0)
Hemoglobin: 10.7 g/dL — ABNORMAL LOW (ref 12.0–15.0)
Immature Granulocytes: 0 %
Lymphocytes Relative: 12 %
Lymphs Abs: 1.4 10*3/uL (ref 0.7–4.0)
MCH: 26.2 pg (ref 26.0–34.0)
MCHC: 31.8 g/dL (ref 30.0–36.0)
MCV: 82.4 fL (ref 80.0–100.0)
Monocytes Absolute: 0.8 10*3/uL (ref 0.1–1.0)
Monocytes Relative: 7 %
Neutro Abs: 8.9 10*3/uL — ABNORMAL HIGH (ref 1.7–7.7)
Neutrophils Relative %: 80 %
Platelet Count: 350 10*3/uL (ref 150–400)
RBC: 4.09 MIL/uL (ref 3.87–5.11)
RDW: 14.9 % (ref 11.5–15.5)
WBC Count: 11.3 10*3/uL — ABNORMAL HIGH (ref 4.0–10.5)
nRBC: 0 % (ref 0.0–0.2)

## 2021-04-27 LAB — CMP (CANCER CENTER ONLY)
ALT: 12 U/L (ref 0–44)
AST: 13 U/L — ABNORMAL LOW (ref 15–41)
Albumin: 3.4 g/dL — ABNORMAL LOW (ref 3.5–5.0)
Alkaline Phosphatase: 111 U/L (ref 38–126)
Anion gap: 9 (ref 5–15)
BUN: 13 mg/dL (ref 8–23)
CO2: 28 mmol/L (ref 22–32)
Calcium: 8.8 mg/dL — ABNORMAL LOW (ref 8.9–10.3)
Chloride: 105 mmol/L (ref 98–111)
Creatinine: 0.73 mg/dL (ref 0.44–1.00)
GFR, Estimated: >60 mL/min (ref >60)
Glucose, Bld: 85 mg/dL (ref 70–99)
Potassium: 4.1 mmol/L (ref 3.5–5.1)
Sodium: 142 mmol/L (ref 135–145)
Total Bilirubin: 0.3 mg/dL (ref 0.3–1.2)
Total Protein: 7.2 g/dL (ref 6.5–8.1)

## 2021-04-27 MED ORDER — IOHEXOL 350 MG/ML SOLN
60.0000 mL | Freq: Once | INTRAVENOUS | Status: AC | PRN
  Administered 2021-04-27: 60 mL via INTRAVENOUS

## 2021-04-29 ENCOUNTER — Other Ambulatory Visit: Payer: Self-pay

## 2021-04-29 ENCOUNTER — Inpatient Hospital Stay (HOSPITAL_BASED_OUTPATIENT_CLINIC_OR_DEPARTMENT_OTHER): Payer: Medicare Other | Admitting: Internal Medicine

## 2021-04-29 ENCOUNTER — Encounter: Payer: Self-pay | Admitting: Internal Medicine

## 2021-04-29 VITALS — BP 151/67 | HR 84 | Temp 97.7°F | Resp 20 | Ht 62.0 in | Wt 159.0 lb

## 2021-04-29 DIAGNOSIS — R591 Generalized enlarged lymph nodes: Secondary | ICD-10-CM | POA: Diagnosis not present

## 2021-04-29 DIAGNOSIS — C3492 Malignant neoplasm of unspecified part of left bronchus or lung: Secondary | ICD-10-CM

## 2021-04-29 DIAGNOSIS — C3412 Malignant neoplasm of upper lobe, left bronchus or lung: Secondary | ICD-10-CM

## 2021-04-29 DIAGNOSIS — C3490 Malignant neoplasm of unspecified part of unspecified bronchus or lung: Secondary | ICD-10-CM

## 2021-04-29 DIAGNOSIS — I10 Essential (primary) hypertension: Secondary | ICD-10-CM | POA: Diagnosis not present

## 2021-04-29 NOTE — Progress Notes (Signed)
.mo      [place] [phone]   Fax:([phone]  OFFICE PROGRESS NOTE  Herrera, [name], MD [place] [address]  DIAGNOSIS: Stage IIB (T2b, N1, M0) non-small cell lung cancer, adenocarcinoma presented with large left upper lobe lung mass in addition to left hilar lymphadenopathy diagnosed in April 2018. Her previous workup was done at [facility] in [facility]. There was insufficient material to perform the molecular studies.  [facility] Molecular studies: BRCA2 N386fs. Negative for EGFR, ALK, ROS1, BRAF mutations.  PRIOR THERAPY:  1) A course of concurrent chemoradiation with weekly carboplatin for AUC of 2 and paclitaxel 45 MG/M2. First dose 01/24/2017. Status post 6 cycles. 2) Consolidation immunotherapy with Imfinzi (Durvalumab) 10 mg/KG every 2 weeks status post 26 cycles.   CURRENT THERAPY: Observation.  INTERVAL HISTORY: Felicia Herrera 65 y.o. female returns back to the clinic today for 97-month follow-up visit accompanied by her friend Lattie Haw.  The patient is feeling fine today with no concerning complaints except for occasional shortness of breath with exertion.  She denied having any chest pain, cough or hemoptysis.  She denied having any fever or chills.  She has no nausea, vomiting, diarrhea or constipation.  She has no headache or visual changes.  She is followed by Dr. Chase Caller for her COPD and pulmonary hypertension.  The patient is here today for evaluation and repeat CT scan of the chest for restaging of her disease.  MEDICAL HISTORY: Past Medical History:  Diagnosis Date   Adenocarcinoma of left lung, stage 2 ([place]) 12/27/2016   Cancer ([place])    Lun cancer: Right Dx 2008, s/p lobectomy. Left Dx 09/2016   COPD (chronic obstructive pulmonary disease) ([place])    Encounter for antineoplastic chemotherapy 12/27/2016   History of radiation therapy 01/24/17-03/08/17   left lung 2 Gy in 30 fractions    Hyperlipidemia    Hypertension    Stroke [facility])     ALLERGIES:  is allergic to anoro ellipta [umeclidinium-vilanterol].  MEDICATIONS:  Current Outpatient Medications  Medication Sig Dispense Refill   albuterol (VENTOLIN HFA) 108 (90 Base) MCG/ACT inhaler Inhale 2 puffs into the lungs every 6 (six) hours as needed for wheezing or shortness of breath. 8 g 6   amLODipine (NORVASC) 5 MG tablet Take 1 tablet (5 mg total) by mouth daily. 90 tablet 3   aspirin EC 81 MG tablet Take 81 mg by mouth daily.     atorvastatin (LIPITOR) 40 MG tablet Take 1 tablet (40 mg total) by mouth daily. 90 tablet 3   Azelastine HCl 137 MCG/SPRAY SOLN USE 2 SPRAY(S) IN EACH NOSTRIL TWICE DAILY AS DIRECTED 30 mL 2   betamethasone dipropionate (DIPROLENE) 0.05 % cream Apply topically daily as needed. 30 g 2   clotrimazole-betamethasone (LOTRISONE) cream Apply 1 application topically daily. 30 g 1   doxycycline (VIBRA-TABS) 100 MG tablet Take 1 tablet (100 mg total) by mouth 2 (two) times daily. 14 tablet 0   FIBER ADULT GUMMIES PO Take by mouth.     fluticasone (FLONASE) 50 MCG/ACT nasal spray Place 1 spray into both nostrils 2 (two) times daily as needed. 16 g 3   fluticasone furoate-vilanterol (BREO ELLIPTA) 200-25 MCG/ACT AEPB INHALE 1 PUFF ONCE DAILY 60 each 1   guaiFENesin (MUCINEX) 600 MG 12 hr tablet Take 1 tablet (600 mg total) by mouth 2 (two) times daily. (Patient taking differently: Take 1,200 mg by mouth 2 (two) times daily.) 60  tablet 2   levothyroxine (SYNTHROID) 75 MCG tablet Take 1 tablet (75 mcg total) by mouth daily before breakfast. 90 tablet 3   loratadine (CLARITIN) 10 MG tablet Take 10 mg by mouth daily.     nystatin cream (MYCOSTATIN) Apply 1 application topically 2 (two) times daily. (Patient taking differently: Apply 1 application topically 2 (two) times daily as needed.) 30 g 0   omeprazole (PRILOSEC) 20 MG capsule Take 1 capsule (20 mg total) by mouth daily. 90 capsule 3    oxyCODONE-acetaminophen (PERCOCET/ROXICET) 5-325 MG tablet Take 0.5 tablets by mouth every 8 (eight) hours as needed for severe pain. 45 tablet 0   Probiotic Product (PROBIOTIC ADVANCED PO) Take 1 capsule by mouth daily.     Respiratory Therapy Supplies (FLUTTER) DEVI Use as directed 1 each 0   No current facility-administered medications for this visit.   Facility-Administered Medications Ordered in Other Visits  Medication Dose Route Frequency Provider Last Rate Last Admin   sodium chloride flush (NS) 0.9 % injection 10 mL  10 mL Intracatheter PRN Curt Bears, MD   10 mL at 06/22/17 1747    SURGICAL HISTORY:  Past Surgical History:  Procedure Laterality Date   BREAST BIOPSY     several. Denies Hx of breast cancer.   IR FLUORO GUIDE PORT INSERTION RIGHT  02/04/2017   IR US GUIDE VASC ACCESS RIGHT  02/04/2017   THORACOTOMY/LOBECTOMY Right 2008   TONSILLECTOMY  1960    REVIEW OF SYSTEMS:  A comprehensive review of systems was negative except for: Respiratory: positive for dyspnea on exertion   PHYSICAL EXAMINATION: General appearance: alert, cooperative, and no distress Head: Normocephalic, without obvious abnormality, atraumatic Neck: no adenopathy, no JVD, supple, symmetrical, trachea midline, and thyroid not enlarged, symmetric, no tenderness/mass/nodules Lymph nodes: Cervical, supraclavicular, and axillary nodes normal. Resp: clear to auscultation bilaterally Back: symmetric, no curvature. ROM normal. No CVA tenderness. Cardio: regular rate and rhythm, S1, S2 normal, no murmur, click, rub or gallop GI: soft, non-tender; bowel sounds normal; no masses,  no organomegaly Extremities: extremities normal, atraumatic, no cyanosis or edema  ECOG PERFORMANCE STATUS: 1 - Symptomatic but completely ambulatory  Blood pressure (!) 151/67, pulse 84, temperature 97.7 F (36.5 C), temperature source Tympanic, resp. rate 20, height $RemoveBe'5\' 2"'uwUIeogfj$  (1.575 m), weight 159 lb (72.1 kg), SpO2 94  %.  LABORATORY DATA: Lab Results  Component Value Date   WBC 11.3 (H) 04/27/2021   HGB 10.7 (L) 04/27/2021   HCT 33.7 (L) 04/27/2021   MCV 82.4 04/27/2021   PLT 350 04/27/2021      Chemistry      Component Value Date/Time   NA 142 04/27/2021 1508   NA 142 06/22/2017 1335   K 4.1 04/27/2021 1508   K 3.9 06/22/2017 1335   CL 105 04/27/2021 1508   CO2 28 04/27/2021 1508   CO2 26 06/22/2017 1335   BUN 13 04/27/2021 1508   BUN 13.6 06/22/2017 1335   CREATININE 0.73 04/27/2021 1508   CREATININE 0.7 06/22/2017 1335      Component Value Date/Time   CALCIUM 8.8 (L) 04/27/2021 1508   CALCIUM 9.4 06/22/2017 1335   ALKPHOS 111 04/27/2021 1508   ALKPHOS 93 06/22/2017 1335   AST 13 (L) 04/27/2021 1508   AST 12 06/22/2017 1335   ALT 12 04/27/2021 1508   ALT 15 06/22/2017 1335   BILITOT 0.3 04/27/2021 1508   BILITOT 0.25 06/22/2017 1335       RADIOGRAPHIC STUDIES: CT Chest W Contrast  Result Date: 04/28/2021 CLINICAL DATA:  Primary Cancer Type: Lung Imaging Indication: Routine surveillance Interval therapy since last imaging? No Initial Cancer Diagnosis Date: 09/2016; Established by: Biopsy-proven Detailed Pathology: Stage IIB non-small cell lung cancer, adenocarcinoma. Primary Tumor location: Left upper lobe. Surgeries: Right lobectomy 2008. Chemotherapy: Yes; Ongoing?  No; Most recent administration: 2018 Immunotherapy?  Yes; Type: Imfinzi; Ongoing? No Radiation therapy? Yes; Date Range: 01/24/2017 - 03/08/2017; Target: Left lung EXAM: CT CHEST WITH CONTRAST TECHNIQUE: Multidetector CT imaging of the chest was performed during intravenous contrast administration. CONTRAST:  46mL OMNIPAQUE IOHEXOL 350 MG/ML SOLN COMPARISON:  Most recent CT chest 10/24/2020. FINDINGS: Cardiovascular: Right Port-A-Cath tip high right atrium. Aortic atherosclerosis. Normal heart size, without pericardial effusion. No central pulmonary embolism, on this non-dedicated study. Pulmonary artery enlargement,  outflow tract 3.2 cm Mediastinum/Nodes: No supraclavicular adenopathy. Right paratracheal nodes of up to the 7 mm are unchanged and not pathologic by size criteria. 8 mm subcarinal node is not significantly changed. Small hiatal hernia. Lungs/Pleura: No pleural fluid. Moderate centrilobular emphysema. Similar smooth right-sided pleural thickening. Right-sided pneumonectomy. Distal tracheal stent with extension to the carina. Resolution of previously described trace air in the pneumonectomy bed. Similar appearance of central left upper lobe radiation induced fibrosis including on 42/5. Left lower lobe peribronchovascular ground-glass and ill-defined nodularity again identified. Slightly progressive including on 82/5. Upper Abdomen: Significant right hemidiaphragm elevation secondary to right-sided volume loss. Normal imaged portions of the liver, spleen, pancreas, gallbladder, adrenal glands, kidneys. Musculoskeletal: Bilateral rib defects, likely postsurgical on the right and either postsurgical or posttraumatic on the left. T3 mild-to-moderate compression deformity is unchanged. Convex right thoracic spine curvature. IMPRESSION: 1. Status post right-sided pneumonectomy, without recurrent or metastatic disease. 2. Similar appearance of central left upper lobe radiation induced fibrosis. 3. Minimally progressive left lower lobe peribronchovascular reticulonodular opacities, suspicious for chronic aspiration or infection. 4. Aortic atherosclerosis (ICD10-I70.0) and emphysema (ICD10-J43.9). 5. Small hiatal hernia. 6. Pulmonary artery enlargement suggests pulmonary arterial hypertension. Electronically Signed   By: Abigail Miyamoto M.D.   On: 04/28/2021 11:37     ASSESSMENT AND PLAN: This is a very pleasant 65 years old white female recently diagnosed with unresectable a stage IIB non-small cell lung cancer, adenocarcinoma. The patient underwent a course of concurrent chemoradiation with weekly carboplatin and  paclitaxel status post 6 cycles.  She tolerated the treatment well and had partial response. She completed a course of consolidation treatment with immunotherapy with Imfinzi (Durvalumab) status post 26 cycles.  She tolerated her treatment well with no concerning adverse effects. The patient is currently on observation and she is feeling fine today with no concerning complaints except for the baseline shortness of breath and she is followed by Dr. Chase Caller from pulmonary medicine. She had repeat CT scan of the chest performed recently.  I personally and independently reviewed the scan and discussed the results with the patient today. Her scan showed no concerning findings for disease recurrence or metastasis but there was minimally progressive left lower lobe peribronchovascular reticular opacities suspicious for chronic aspiration or infection. I recommended for the patient to come back for follow-up visit in 6 months for evaluation with repeat CT scan of the chest in 6 months. The patient was advised to call immediately if she has any other concerning symptoms in the interval. The patient voices understanding of current disease status and treatment options and is in agreement with the current care plan. All questions were answered. The patient knows to call the clinic with any problems,  questions or concerns. We can certainly see the patient much sooner if necessary.  Disclaimer: This note was dictated with voice recognition software. Similar sounding words can inadvertently be transcribed and may not be corrected upon review.

## 2021-05-04 ENCOUNTER — Ambulatory Visit: Payer: Medicare Other | Admitting: Family Medicine

## 2021-05-08 ENCOUNTER — Telehealth: Payer: Self-pay | Admitting: Internal Medicine

## 2021-05-08 NOTE — Telephone Encounter (Signed)
Sch per 11/9 los,unable to leave message, mailed calendar

## 2021-05-12 ENCOUNTER — Ambulatory Visit: Payer: Medicare Other | Admitting: Family Medicine

## 2021-05-19 ENCOUNTER — Other Ambulatory Visit: Payer: Self-pay | Admitting: Family Medicine

## 2021-05-19 DIAGNOSIS — G894 Chronic pain syndrome: Secondary | ICD-10-CM

## 2021-05-19 DIAGNOSIS — C3492 Malignant neoplasm of unspecified part of left bronchus or lung: Secondary | ICD-10-CM

## 2021-05-19 NOTE — Telephone Encounter (Signed)
-   last filled 04/24/21 - last office visit 11/19/20 - next office visit 05/22/21 - med contract on file 11/19/20 - UDS 02/18/20 positive for prescribed medication.

## 2021-05-19 NOTE — Telephone Encounter (Signed)
Patient called to say that she is needing a refill of oxyCODONE-acetaminophen (PERCOCET/ROXICET) 5-325 MG tablet to be sent to [place], [place] [address]  Please advise.

## 2021-05-21 NOTE — Progress Notes (Signed)
HPI: [name] is a 65 y.o. female, who is here today for chronic disease management.  Last seen on 11/19/20. Since her last visit she has seen her oncologist. 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 Completed chemoradiation in 01/2017 and consolidation immunotherapy every 2 weeks x 26 cycles. Cough and wheezing have been stable. COPD, she follows with pulmonologist.   Status post right lobectomy in 2008, residual costal pain since then. She is currently on oxycodone-acetaminophen 5-325 mg 1/2 tablet 3 times daily, this regimen has helped with pain. She has not noticed side effects, tolerating medication well. Negative for fever, chills, or worsening fatigue.  Chest CT done on 04/27/21:Aortic atherosclerosis (ICD10-I70.0) and emphysema (ICD10-J43.9).  Today she is concerned about 6 months of left rib cage pain. She has not noted breast tenderness, masses, skin changes, or nipple discharge.  Pain happens when palpating area. Pain is not exacerbated by movement or by deep breathing. Problem seems to be stable. She has not tried OTC medications.  GERD:  She is on Omeprazole. She was having "terrible" heartburn and acid reflux. Increased dose of omeprazole from 20 mg daily to 20 mg twice daily and symptoms have greatly improved. Negative for abdominal pain, nausea, vomiting, melena, changes in bowel habits, or blood in the stool.  Hypothyroidism: Currently she is on levothyroxine 75 mcg daily. History of osteoporosis on Fosamax 70 mg weekly.  Lab Results  Component Value Date   TSH 0.78 07/02/2020   BP mildly elevated today. Home BP's once per week, 120's/80. Negative for unusual headache, visual changes, focal neurologic deficit, or edema.  Lab Results  Component Value Date   CREATININE 0.73 04/27/2021   BUN 13 04/27/2021   NA 142 04/27/2021   K 4.1 04/27/2021   CL 105 04/27/2021   CO2 28 04/27/2021   Review of Systems  Constitutional:  Negative for  activity change, appetite change and unexpected weight change.  HENT:  Negative for mouth sores, sore throat and trouble swallowing.   Endocrine: Negative for cold intolerance and heat intolerance.  Genitourinary:  Negative for decreased urine volume, dysuria and hematuria.  Musculoskeletal:  Positive for arthralgias. Negative for gait problem.  Neurological:  Negative for syncope, facial asymmetry and weakness.  Rest of ROS see pertinent positives and negatives in HPI.  Current Outpatient Medications on File Prior to Visit  Medication Sig Dispense Refill   albuterol (VENTOLIN HFA) 108 (90 Base) MCG/ACT inhaler Inhale 2 puffs into the lungs every 6 (six) hours as needed for wheezing or shortness of breath. 8 g 6   amLODipine (NORVASC) 5 MG tablet Take 1 tablet (5 mg total) by mouth daily. 90 tablet 3   aspirin EC 81 MG tablet Take 81 mg by mouth daily.     atorvastatin (LIPITOR) 40 MG tablet Take 1 tablet (40 mg total) by mouth daily. 90 tablet 3   Azelastine HCl 137 MCG/SPRAY SOLN USE 2 SPRAY(S) IN EACH NOSTRIL TWICE DAILY AS DIRECTED 30 mL 2   betamethasone dipropionate (DIPROLENE) 0.05 % cream Apply topically daily as needed. 30 g 2   clotrimazole-betamethasone (LOTRISONE) cream Apply 1 application topically daily. 30 g 1   doxycycline (VIBRA-TABS) 100 MG tablet Take 1 tablet (100 mg total) by mouth 2 (two) times daily. 14 tablet 0   FIBER ADULT GUMMIES PO Take by mouth.     fluticasone furoate-vilanterol (BREO ELLIPTA) 200-25 MCG/ACT AEPB INHALE 1 PUFF ONCE DAILY 60 each 1   guaiFENesin (MUCINEX) 600 MG  12 hr tablet Take 1 tablet (600 mg total) by mouth 2 (two) times daily. (Patient taking differently: Take 600 mg by mouth daily.) 60 tablet 2   levothyroxine (SYNTHROID) 75 MCG tablet Take 1 tablet (75 mcg total) by mouth daily before breakfast. 90 tablet 3   loratadine (CLARITIN) 10 MG tablet Take 10 mg by mouth daily.     nystatin cream (MYCOSTATIN) Apply 1 application topically 2 (two)  times daily. (Patient taking differently: Apply 1 application topically 2 (two) times daily as needed.) 30 g 0   oxyCODONE-acetaminophen (PERCOCET/ROXICET) 5-325 MG tablet Take 0.5 tablets by mouth every 8 (eight) hours as needed for severe pain. 45 tablet 0   Probiotic Product (PROBIOTIC ADVANCED PO) Take 1 capsule by mouth daily.     Respiratory Therapy Supplies (FLUTTER) DEVI Use as directed 1 each 0   fluticasone (FLONASE) 50 MCG/ACT nasal spray Place 1 spray into both nostrils 2 (two) times daily as needed. (Patient not taking: Reported on 05/22/2021) 16 g 3   Current Facility-Administered Medications on File Prior to Visit  Medication Dose Route Frequency Provider Last Rate Last Admin   sodium chloride flush (NS) 0.9 % injection 10 mL  10 mL Intracatheter PRN Curt Bears, MD   10 mL at 06/22/17 1747    Past Medical History:  Diagnosis Date   Adenocarcinoma of left lung, stage 2 ([place]) 12/27/2016   Cancer ([place])    Lun cancer: Right Dx 2008, s/p lobectomy. Left Dx 09/2016   COPD (chronic obstructive pulmonary disease) ([place])    Encounter for antineoplastic chemotherapy 12/27/2016   History of radiation therapy 01/24/17-03/08/17   left lung 2 Gy in 30 fractions   Hyperlipidemia    Hypertension    Stroke ([place])    Allergies  Allergen Reactions   Anoro Ellipta [Umeclidinium-Vilanterol]     Taste issues    Social History   Socioeconomic History   Marital status: Single    Spouse name: Not on file   Number of children: Not on file   Years of education: Not on file   Highest education level: Not on file  Occupational History   Occupation: landscaping  Tobacco Use   Smoking status: Former    Packs/day: 1.00    Years: 30.00    Pack years: 30.00    Types: Cigarettes    Quit date: 06/21/2005    Years since quitting: 15.9   Smokeless tobacco: Never  Vaping Use   Vaping Use: Never used  Substance and Sexual Activity   Alcohol use: No   Drug use: No   Sexual activity: Not  Currently  Other Topics Concern   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    Social Determinants of Health   Financial Resource Strain: Low Risk    Difficulty of Paying Living Expenses: Not hard at all  Food Insecurity: No Food Insecurity   Worried About Charity fundraiser in the Last Year: Never true   [place] in the Last Year: Never true  Transportation Needs: No Transportation Needs   Lack of Transportation (Medical): No   Lack of Transportation (Non-Medical): No  Physical Activity: Insufficiently Active   Days of Exercise per Week: 6 days   Minutes of Exercise per Session: 20 min  Stress: No Stress Concern Present   Feeling of Stress : Not at all  Social Connections: Socially Isolated   Frequency of Communication with Friends and Family:  More than three times a week   Frequency of Social Gatherings with Friends and Family: Three times a week   Attends Religious Services: Never   Active Member of Clubs or Organizations: No   Attends Archivist Meetings: Never   Marital Status: Never married   Vitals:   05/22/21 1238  BP: (!) 142/78  Pulse: 72  Resp: 16  Temp: 97.7 F (36.5 C)  SpO2: 93%   Body mass index is 28.42 kg/m.  Physical Exam Vitals and nursing note reviewed.  Constitutional:      General: She is not in acute distress.    Appearance: She is well-developed.  HENT:     Head: Normocephalic and atraumatic.     Mouth/Throat:     Mouth: Mucous membranes are moist.     Pharynx: Oropharynx is clear.  Eyes:     Conjunctiva/sclera: Conjunctivae normal.  Cardiovascular:     Rate and Rhythm: Normal rate and regular rhythm.     Pulses:          Dorsalis pedis pulses are 2+ on the right side and 2+ on the left side.     Heart sounds: No murmur heard. Pulmonary:     Effort: Pulmonary effort is normal. No respiratory distress.     Breath sounds: No decreased air movement. No decreased breath sounds, wheezing,  rhonchi or rales.  Chest:     Chest wall: Tenderness present.    Abdominal:     Palpations: Abdomen is soft. There is no hepatomegaly or mass.     Tenderness: There is no abdominal tenderness.  Lymphadenopathy:     Cervical: No cervical adenopathy.  Skin:    General: Skin is warm.     Findings: No erythema or rash.  Neurological:     Mental Status: She is alert and oriented to person, place, and time.     Gait: Gait normal.  Psychiatric:     Comments: Well groomed, good eye contact.   ASSESSMENT AND PLAN:  Felicia Herrera was seen today for follow-up.  Diagnoses and all orders for this visit: Orders Placed This Encounter  Procedures   TSH   Costal margin pain Last chest CT with contrast on 04/27/2021 was negative for bone suspicious lesion. It seems to be musculoskeletal. Recommend OTC IcyHot patch on affected area. Monitor for changes.  GERD (gastroesophageal reflux disease) Symptoms greatly improved when omeprazole dose was increased. Continue omeprazole 20 mg twice daily. GERD precautions also recommended.  Hypertension, essential, benign SBP mildly elevated today, home BP readings appropriate. Continue amlodipine 5 mg daily. Continue monitoring BP regularly and low-salt diet.  Atherosclerosis of aorta ([place]) We discussed recent chest CT aortic findings. Continue atorvastatin 40 mg daily.  Primary hypothyroidism TSH has been in normal range, more towards the hyper side (0.7). Given her history of osteoporosis,it may be more beneficial for her to have a TSH around 2-2.5. She agrees with decreasing dose of levothyroxine 75 mcg to 1/2 tablet on Sundays, continue 1 tablet the rest of the days. TSH to be evaluated in 6 to 8 weeks.  Adenocarcinoma of left lung, stage 2 ([place]) S/P right lobectomy in 2008, residual costal pain. Stable. Oxycodone-acetaminophen has helped. Recent chest CT was negative for metastatic disease. Following with oncologist.  Chronic pain  disorder She understands current recommendations in regard to chronic opioid intake for pain management. [place] controlled substance report reviewed, med contract is current. We discussed some side effects of oxycodone-acetaminophen.  Return in about 4 months (  around 09/20/2021) for Lab in 8 weeks, morning..  Felicia Kuehnel G. [name], MD  [facility]. [place] office.

## 2021-05-22 ENCOUNTER — Other Ambulatory Visit: Payer: Self-pay | Admitting: Family Medicine

## 2021-05-22 ENCOUNTER — Ambulatory Visit (INDEPENDENT_AMBULATORY_CARE_PROVIDER_SITE_OTHER): Payer: Medicare Other | Admitting: Family Medicine

## 2021-05-22 ENCOUNTER — Encounter: Payer: Self-pay | Admitting: Family Medicine

## 2021-05-22 ENCOUNTER — Other Ambulatory Visit: Payer: Self-pay

## 2021-05-22 VITALS — BP 142/78 | HR 72 | Temp 97.7°F | Resp 16 | Ht 62.0 in | Wt 155.4 lb

## 2021-05-22 DIAGNOSIS — E039 Hypothyroidism, unspecified: Secondary | ICD-10-CM | POA: Diagnosis not present

## 2021-05-22 DIAGNOSIS — C3492 Malignant neoplasm of unspecified part of left bronchus or lung: Secondary | ICD-10-CM

## 2021-05-22 DIAGNOSIS — K219 Gastro-esophageal reflux disease without esophagitis: Secondary | ICD-10-CM

## 2021-05-22 DIAGNOSIS — R0781 Pleurodynia: Secondary | ICD-10-CM

## 2021-05-22 DIAGNOSIS — G894 Chronic pain syndrome: Secondary | ICD-10-CM | POA: Diagnosis not present

## 2021-05-22 DIAGNOSIS — I10 Essential (primary) hypertension: Secondary | ICD-10-CM | POA: Diagnosis not present

## 2021-05-22 DIAGNOSIS — I700 Atherosclerosis of aorta: Secondary | ICD-10-CM

## 2021-05-22 MED ORDER — OXYCODONE-ACETAMINOPHEN 5-325 MG PO TABS: 0.5000 | ORAL_TABLET | Freq: Three times a day (TID) | ORAL | 0 refills | Status: DC | PRN

## 2021-05-22 MED ORDER — OMEPRAZOLE 20 MG PO CPDR: 20.0000 mg | DELAYED_RELEASE_CAPSULE | Freq: Two times a day (BID) | ORAL | 2 refills | Status: DC

## 2021-05-22 NOTE — Telephone Encounter (Signed)
This has been sent in  

## 2021-05-22 NOTE — Assessment & Plan Note (Signed)
SBP mildly elevated today, home BP readings appropriate. Continue amlodipine 5 mg daily. Continue monitoring BP regularly and low-salt diet.

## 2021-05-22 NOTE — Telephone Encounter (Signed)
Patient needs a refill on Oxycodone.  Pharmacy- [facility] on [address]

## 2021-05-22 NOTE — Assessment & Plan Note (Signed)
Symptoms greatly improved when omeprazole dose was increased. Continue omeprazole 20 mg twice daily. GERD precautions also recommended.

## 2021-05-22 NOTE — Assessment & Plan Note (Signed)
She understands current recommendations in regard to chronic opioid intake for pain management. [place] controlled substance report reviewed, med contract is current. We discussed some side effects of oxycodone-acetaminophen.

## 2021-05-22 NOTE — Assessment & Plan Note (Addendum)
S/P right lobectomy in 2008, residual costal pain. Stable. Oxycodone-acetaminophen has helped. Recent chest CT was negative for metastatic disease. Following with oncologist.

## 2021-05-22 NOTE — Addendum Note (Signed)
Addended by: [name], Dixie Coppa G on: 05/22/2021 02:18 PM   Modules accepted: Orders

## 2021-05-22 NOTE — Assessment & Plan Note (Signed)
We discussed recent chest CT aortic findings. Continue atorvastatin 40 mg daily.

## 2021-05-22 NOTE — Patient Instructions (Addendum)
A few things to remember from today's visit:   Primary hypothyroidism  Gastroesophageal reflux disease, unspecified whether esophagitis present  Chronic pain disorder  Gastroesophageal reflux disease - Plan: omeprazole (PRILOSEC) 20 MG capsule  If you need refills please call your pharmacy. Do not use My Chart to request refills or for acute issues that need immediate attention.   Decreased thyroid med to 1/2 tab on Sundays. Rest of the days continue 1 tab. TSH in 8 weeks. No changes in rest. Continue monitoring blood pressure.  Left rib pain seems to be musculoskeletal. Recent CT did not show suspicious lesions. Icy hot patch on the area may help with pain.  Please be sure medication list is accurate. If a new problem present, please set up appointment sooner than planned today.

## 2021-05-22 NOTE — Assessment & Plan Note (Signed)
TSH has been in normal range, more towards the hyper side (0.7). Given her history of osteoporosis,it may be more beneficial for her to have a TSH around 2-2.5. She agrees with decreasing dose of levothyroxine 75 mcg to 1/2 tablet on Sundays, continue 1 tablet the rest of the days. TSH to be evaluated in 6 to 8 weeks.

## 2021-05-25 ENCOUNTER — Telehealth: Payer: Self-pay | Admitting: Pharmacist

## 2021-05-25 NOTE — Chronic Care Management (AMB) (Signed)
Chronic Care Management Pharmacy Assistant   Name: Felicia Herrera  MRN: [id number] DOB: January 06, 1956  Reason for Encounter: Disease State / Hypertension Assessment Call   Conditions to be addressed/monitored: HTN   Recent office visits:  05/22/2021 Betty [name] MD - Patient was seen for costal margin pain and additional issues. Increased Omeprazole 20 to twice daily. Follow up in 4 months.  Recent consult visits:  04/29/2021 Curt Bears MD (oncology) - Patient was seen for Malignant neoplasm of unspecified part of unspecified bronchus or lung and an additional issue. No medication changes. Follow up in 6 months.  Hospital visits:  None  Medications: Outpatient Encounter Medications as of 05/25/2021  Medication Sig   albuterol (VENTOLIN HFA) 108 (90 Base) MCG/ACT inhaler Inhale 2 puffs into the lungs every 6 (six) hours as needed for wheezing or shortness of breath.   amLODipine (NORVASC) 5 MG tablet Take 1 tablet (5 mg total) by mouth daily.   aspirin EC 81 MG tablet Take 81 mg by mouth daily.   atorvastatin (LIPITOR) 40 MG tablet Take 1 tablet (40 mg total) by mouth daily.   Azelastine HCl 137 MCG/SPRAY SOLN USE 2 SPRAY(S) IN EACH NOSTRIL TWICE DAILY AS DIRECTED   betamethasone dipropionate (DIPROLENE) 0.05 % cream Apply topically daily as needed.   clotrimazole-betamethasone (LOTRISONE) cream Apply 1 application topically daily.   doxycycline (VIBRA-TABS) 100 MG tablet Take 1 tablet (100 mg total) by mouth 2 (two) times daily.   FIBER ADULT GUMMIES PO Take by mouth.   fluticasone (FLONASE) 50 MCG/ACT nasal spray Place 1 spray into both nostrils 2 (two) times daily as needed. (Patient not taking: Reported on 05/22/2021)   fluticasone furoate-vilanterol (BREO ELLIPTA) 200-25 MCG/ACT AEPB INHALE 1 PUFF ONCE DAILY   guaiFENesin (MUCINEX) 600 MG 12 hr tablet Take 1 tablet (600 mg total) by mouth 2 (two) times daily. (Patient taking differently: Take 600 mg by mouth daily.)    levothyroxine (SYNTHROID) 75 MCG tablet Take 1 tablet (75 mcg total) by mouth daily before breakfast.   loratadine (CLARITIN) 10 MG tablet Take 10 mg by mouth daily.   nystatin cream (MYCOSTATIN) Apply 1 application topically 2 (two) times daily. (Patient taking differently: Apply 1 application topically 2 (two) times daily as needed.)   omeprazole (PRILOSEC) 20 MG capsule Take 1 capsule (20 mg total) by mouth 2 (two) times daily before a meal.   oxyCODONE-acetaminophen (PERCOCET/ROXICET) 5-325 MG tablet Take 0.5 tablets by mouth every 8 (eight) hours as needed for severe pain.   Probiotic Product (PROBIOTIC ADVANCED PO) Take 1 capsule by mouth daily.   Respiratory Therapy Supplies (FLUTTER) DEVI Use as directed   Facility-Administered Encounter Medications as of 05/25/2021  Medication   sodium chloride flush (NS) 0.9 % injection 10 mL  Fill History: ATORVASTATIN CALCIUM  40 MG TABS 03/20/2021 90   AZELASTINE HYDROCHLORIDE  137 MCG/SPRAY SOLN 03/20/2021 75   BREO ELLIPTA 200-25  INH 04/25/2021 30   LEVOTHYROXINE SODIUM  75 MCG TABS 03/20/2021 90   OMEPRAZOLE  20 MG CPDR 03/20/2021 90   AMLODIPINE BESYLATE  5 MG TABS 03/20/2021 90   OXYCODONE/ACETAMINOPHEN  5-325 MG TABS 04/24/2021 30   Reviewed chart prior to disease state call. Spoke with patient regarding BP  Recent Office Vitals: BP Readings from Last 3 Encounters:  05/22/21 (!) 142/78  04/29/21 (!) 151/67  04/13/21 122/76   Pulse Readings from Last 3 Encounters:  05/22/21 72  04/29/21 84  04/13/21 79    Wt  Readings from Last 3 Encounters:  05/22/21 155 lb 6.4 oz (70.5 kg)  04/29/21 159 lb (72.1 kg)  04/13/21 157 lb (71.2 kg)     Kidney Function Lab Results  Component Value Date/Time   CREATININE 0.73 04/27/2021 03:08 PM   CREATININE 0.68 10/24/2020 11:49 AM   CREATININE 0.7 06/22/2017 01:35 PM   CREATININE 0.7 06/08/2017 08:45 AM   GFR 77.38 07/15/2020 07:14 AM   GFRNONAA >60 04/27/2021 03:08 PM   GFRAA >60  10/22/2019 09:43 AM    BMP Latest Ref Rng & Units 04/27/2021 10/24/2020 07/15/2020  Glucose 70 - 99 mg/dL 85 104(H) 106(H)  BUN 8 - 23 mg/dL 13 11 16   Creatinine 0.44 - 1.00 mg/dL 0.73 0.68 0.81  Sodium 135 - 145 mmol/L 142 142 139  Potassium 3.5 - 5.1 mmol/L 4.1 3.7 3.4(L)  Chloride 98 - 111 mmol/L 105 107 101  CO2 22 - 32 mmol/L 28 27 29   Calcium 8.9 - 10.3 mg/dL 8.8(L) 8.8(L) 9.5    Current antihypertensive regimen:  Amlodipine 5 mg daily  How often are you checking your Blood Pressure? weekly  Current home BP readings: 05/20/21 124/80  What recent interventions/DTPs have been made by any provider to improve Blood Pressure control since last CPP Visit: None  Any recent hospitalizations or ED visits since last visit with CPP? No  What diet changes have been made to improve Blood Pressure Control?  Patient is eating twice daily. For breakfast she will eat eggs with vegetables and cheese.  For dinner she will have a variety, tonight she has made a pot of chili.  She doesn't typically eat a lot of meat.   What exercise is being done to improve your Blood Pressure Control?  Patient stays busy shopping, walking and housework.   Adherence Review: Is the patient currently on ACE/ARB medication? No Does the patient have >5 day gap between last estimated fill dates? No  Care Gaps: AWV - scheduled for 09/29/21 Last BP - 142/78 on 05/22/2021  Star Rating Drugs: Atorvastatin 40mg  - last filled on 03/20/21 90DS at [facility] [phone]

## 2021-06-16 ENCOUNTER — Telehealth: Payer: Self-pay | Admitting: Family Medicine

## 2021-06-16 NOTE — Telephone Encounter (Signed)
disregard

## 2021-06-22 ENCOUNTER — Other Ambulatory Visit: Payer: Self-pay | Admitting: Internal Medicine

## 2021-06-23 ENCOUNTER — Other Ambulatory Visit: Payer: Self-pay | Admitting: Family Medicine

## 2021-06-23 DIAGNOSIS — G894 Chronic pain syndrome: Secondary | ICD-10-CM

## 2021-06-23 DIAGNOSIS — C3492 Malignant neoplasm of unspecified part of left bronchus or lung: Secondary | ICD-10-CM

## 2021-06-23 NOTE — Telephone Encounter (Signed)
Patient called because she went to pickup her prescription for oxyCODONE-acetaminophen (PERCOCET/ROXICET) 5-325 MG tablet and they only had 11 tablets in stock. Pharmacist told patient to get Dr.Jordan to write a prescription for the remaining amount and they would bill insurances as soon as they could.    Please send to  [place], [place] - [address] [address] Phone:  [phone]  Fax:  [phone]         Please advise

## 2021-06-24 MED ORDER — OXYCODONE-ACETAMINOPHEN 5-325 MG PO TABS: 0.5000 | ORAL_TABLET | Freq: Three times a day (TID) | ORAL | 0 refills | Status: DC | PRN

## 2021-07-14 ENCOUNTER — Other Ambulatory Visit: Payer: Self-pay | Admitting: Family Medicine

## 2021-07-14 DIAGNOSIS — G894 Chronic pain syndrome: Secondary | ICD-10-CM

## 2021-07-14 DIAGNOSIS — C3492 Malignant neoplasm of unspecified part of left bronchus or lung: Secondary | ICD-10-CM

## 2021-07-14 NOTE — Telephone Encounter (Signed)
-   last filled 06/29/21 - has upcoming appt in April

## 2021-07-14 NOTE — Telephone Encounter (Signed)
Pt needs a refill for Oxycodone.  Pharm[facility]-- [facility] [address]

## 2021-07-15 MED ORDER — OXYCODONE-ACETAMINOPHEN 5-325 MG PO TABS: 0.5000 | ORAL_TABLET | Freq: Three times a day (TID) | ORAL | 0 refills | Status: DC | PRN

## 2021-07-17 ENCOUNTER — Telehealth: Payer: Self-pay | Admitting: Family Medicine

## 2021-07-17 ENCOUNTER — Other Ambulatory Visit: Payer: Medicare Other

## 2021-07-17 DIAGNOSIS — R69 Illness, unspecified: Secondary | ICD-10-CM | POA: Diagnosis not present

## 2021-07-17 NOTE — Telephone Encounter (Signed)
Patient is requesting for someone to send her in antibiotics for her infected mouth. Patient stated that Dr.Jordan has prior knowledge about this.  Patient could be contacted at [phone].  Please advise.

## 2021-07-20 ENCOUNTER — Other Ambulatory Visit (INDEPENDENT_AMBULATORY_CARE_PROVIDER_SITE_OTHER): Payer: Medicare Other

## 2021-07-20 DIAGNOSIS — E039 Hypothyroidism, unspecified: Secondary | ICD-10-CM

## 2021-07-20 DIAGNOSIS — R69 Illness, unspecified: Secondary | ICD-10-CM | POA: Diagnosis not present

## 2021-07-20 LAB — TSH: TSH: 7.16 u[IU]/mL — ABNORMAL HIGH (ref 0.35–5.50)

## 2021-07-20 NOTE — Telephone Encounter (Signed)
Pt needs an appointment

## 2021-07-21 NOTE — Telephone Encounter (Signed)
ATC patient to schedule a virtual visit with Dr.Jordan.

## 2021-07-22 ENCOUNTER — Telehealth (INDEPENDENT_AMBULATORY_CARE_PROVIDER_SITE_OTHER): Payer: Medicare Other | Admitting: Family Medicine

## 2021-07-22 ENCOUNTER — Encounter: Payer: Self-pay | Admitting: Family Medicine

## 2021-07-22 VITALS — Ht 62.0 in

## 2021-07-22 DIAGNOSIS — K056 Periodontal disease, unspecified: Secondary | ICD-10-CM | POA: Diagnosis not present

## 2021-07-22 DIAGNOSIS — K120 Recurrent oral aphthae: Secondary | ICD-10-CM | POA: Diagnosis not present

## 2021-07-22 DIAGNOSIS — E039 Hypothyroidism, unspecified: Secondary | ICD-10-CM

## 2021-07-22 MED ORDER — TRIAMCINOLONE ACETONIDE 0.1 % MT PSTE: PASTE | OROMUCOSAL | 0 refills | Status: AC

## 2021-07-22 NOTE — Progress Notes (Signed)
Virtual Visit via Video Note I connected with Felicia Herrera on 07/22/21 by a video enabled telemedicine application and verified that I am speaking with the correct person using two identifiers.  Location patient: home Location provider:work office Persons participating in the virtual visit: patient, provider  I discussed the limitations of evaluation and management by telemedicine and the availability of in person appointments. The patient expressed understanding and agreed to proceed.  Chief Complaint  Patient presents with   Mouth Lesions   HPI: Felicia Herrera is a 66 yo female with hx of lung cancer, hypothyroidism,GERD,COPD,and chronic pain with above concern. 3-4 days ago she noted tender lesion on mucosa of left lower lip,closed to gum and a now feeling some discomfort on right side. It is like a canker sore. This is a new problem. She has not tried OTC medication. Pain is moderate, exacerbated by contact with food.  She is c/o dental problem, bottom teeth "are falling." She states that she has a Designer, fashion/clothing but "nobody takes it." She wonders if she can have "abx or something" to help. Negative for fever, chills, sore throat, worsening cough, wheezing, dyspnea, abdominal pain, nausea, vomiting, changes in bowel habits, urinary symptoms, or a skin rash.  Hypothyroidism: Currently she is on levothyroxine 75 mcg daily, she has been taking it with one of her other medications. She has not noted abnormal changes in weight, unusual fatigue, or cold/heat intolerance. Lab Results  Component Value Date   TSH 7.16 (H) 07/20/2021   ROS: See pertinent positives and negatives per HPI.  Past Medical History:  Diagnosis Date   Adenocarcinoma of left lung, stage 2 ([place]) 12/27/2016   Cancer ([place])    Lun cancer: Right Dx 2008, s/p lobectomy. Left Dx 09/2016   COPD (chronic obstructive pulmonary disease) ([place])    Encounter for antineoplastic chemotherapy 12/27/2016   History of radiation  therapy 01/24/17-03/08/17   left lung 2 Gy in 30 fractions   Hyperlipidemia    Hypertension    Stroke [facility])     Past Surgical History:  Procedure Laterality Date   BREAST BIOPSY     several. Denies Hx of breast cancer.   IR FLUORO GUIDE PORT INSERTION RIGHT  02/04/2017   IR US GUIDE VASC ACCESS RIGHT  02/04/2017   THORACOTOMY/LOBECTOMY Right 2008   TONSILLECTOMY  1960    Family History  Problem Relation Age of Onset   Arthritis Mother    Lung cancer Mother 3       d.45 from cancer   Hypertension Father    Arthritis Father    Pancreatic cancer Sister 36       d.8 from cancer   Brain cancer Brother        d.~70   Colon cancer Brother        d.~70   Hypertension Brother    Mental retardation Brother    Melanoma Brother 64   Lung cancer Maternal Aunt 66   Colon cancer Maternal Grandfather    Depression Neg Hx    Heart disease Neg Hx     Social History   Socioeconomic History   Marital status: Single    Spouse name: Not on file   Number of children: Not on file   Years of education: Not on file   Highest education level: Not on file  Occupational History   Occupation: landscaping  Tobacco Use   Smoking status: Former    Packs/day: 1.00    Years: 30.00  Pack years: 30.00    Types: Cigarettes    Quit date: 06/21/2005    Years since quitting: 16.0   Smokeless tobacco: Never  Vaping Use   Vaping Use: Never used  Substance and Sexual Activity   Alcohol use: No   Drug use: No   Sexual activity: Not Currently  Other Topics Concern   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    Social Determinants of Health   Financial Resource Strain: Low Risk    Difficulty of Paying Living Expenses: Not hard at all  Food Insecurity: No Food Insecurity   Worried About Charity fundraiser in the Last Year: Never true   [place] in the Last Year: Never true  Transportation Needs: No Transportation Needs   Lack of Transportation (Medical):  No   Lack of Transportation (Non-Medical): No  Physical Activity: Insufficiently Active   Days of Exercise per Week: 6 days   Minutes of Exercise per Session: 20 min  Stress: No Stress Concern Present   Feeling of Stress : Not at all  Social Connections: Socially Isolated   Frequency of Communication with Friends and Family: More than three times a week   Frequency of Social Gatherings with Friends and Family: Three times a week   Attends Religious Services: Never   Active Member of Clubs or Organizations: No   Attends Archivist Meetings: Never   Marital Status: Never married  Human resources officer Violence: Not At Risk   Fear of Current or Ex-Partner: No   Emotionally Abused: No   Physically Abused: No   Sexually Abused: No    Current Outpatient Medications:    albuterol (VENTOLIN HFA) 108 (90 Base) MCG/ACT inhaler, Inhale 2 puffs into the lungs every 6 (six) hours as needed for wheezing or shortness of breath., Disp: 8 g, Rfl: 6   amLODipine (NORVASC) 5 MG tablet, Take 1 tablet (5 mg total) by mouth daily., Disp: 90 tablet, Rfl: 3   aspirin EC 81 MG tablet, Take 81 mg by mouth daily., Disp: , Rfl:    atorvastatin (LIPITOR) 40 MG tablet, Take 1 tablet (40 mg total) by mouth daily., Disp: 90 tablet, Rfl: 3   Azelastine HCl 137 MCG/SPRAY SOLN, USE 2 SPRAY(S) IN EACH NOSTRIL TWICE DAILY AS DIRECTED, Disp: 30 mL, Rfl: 2   betamethasone dipropionate (DIPROLENE) 0.05 % cream, Apply topically daily as needed., Disp: 30 g, Rfl: 2   clotrimazole-betamethasone (LOTRISONE) cream, Apply 1 application topically daily., Disp: 30 g, Rfl: 1   doxycycline (VIBRA-TABS) 100 MG tablet, Take 1 tablet (100 mg total) by mouth 2 (two) times daily., Disp: 14 tablet, Rfl: 0   FIBER ADULT GUMMIES PO, Take by mouth., Disp: , Rfl:    fluticasone (FLONASE) 50 MCG/ACT nasal spray, Place 1 spray into both nostrils 2 (two) times daily as needed., Disp: 16 g, Rfl: 3   fluticasone furoate-vilanterol (BREO  ELLIPTA) 200-25 MCG/ACT AEPB, Inhale 1 puff by mouth once daily, Disp: 60 each, Rfl: 5   guaiFENesin (MUCINEX) 600 MG 12 hr tablet, Take 1 tablet (600 mg total) by mouth 2 (two) times daily. (Patient taking differently: Take 600 mg by mouth daily.), Disp: 60 tablet, Rfl: 2   levothyroxine (SYNTHROID) 75 MCG tablet, Take 1 tablet (75 mcg total) by mouth daily before breakfast., Disp: 90 tablet, Rfl: 3   loratadine (CLARITIN) 10 MG tablet, Take 10 mg by mouth daily., Disp: , Rfl:  nystatin cream (MYCOSTATIN), Apply 1 application topically 2 (two) times daily. (Patient taking differently: Apply 1 application topically 2 (two) times daily as needed.), Disp: 30 g, Rfl: 0   omeprazole (PRILOSEC) 20 MG capsule, Take 1 capsule (20 mg total) by mouth 2 (two) times daily before a meal., Disp: 180 capsule, Rfl: 2   oxyCODONE-acetaminophen (PERCOCET/ROXICET) 5-325 MG tablet, Take 0.5 tablets by mouth every 8 (eight) hours as needed for severe pain., Disp: 45 tablet, Rfl: 0   Probiotic Product (PROBIOTIC ADVANCED PO), Take 1 capsule by mouth daily., Disp: , Rfl:    Respiratory Therapy Supplies (FLUTTER) DEVI, Use as directed, Disp: 1 each, Rfl: 0 No current facility-administered medications for this visit.  Facility-Administered Medications Ordered in Other Visits:    sodium chloride flush (NS) 0.9 % injection 10 mL, 10 mL, Intracatheter, PRN, Curt Bears, MD, 10 mL at 06/22/17 1747  EXAM:  VITALS per patient if applicable:Ht 5\' 2"  (1.575 m)    BMI 28.42 kg/m   GENERAL: alert, oriented, appears well and in no acute distress  HEENT: atraumatic, conjunctiva clear, no obvious abnormalities on inspection. She could not aim camera to affected area, could not see oral lesions.  NECK: normal movements of the head and neck  LUNGS: on inspection no signs of respiratory distress, breathing rate appears normal, no obvious gross SOB, gasping or wheezing  CV: no obvious cyanosis  MS: moves all visible  extremities without noticeable abnormality  PSYCH/NEURO: pleasant and cooperative, no obvious depression, + anxious. Speech and thought processing grossly intact  ASSESSMENT AND PLAN:  Discussed the following assessment and plan:  Aphthous ulcer - Plan: triamcinolone (KENALOG) 0.1 % paste Educated about Dx and treatment options. Explained that abx is not indicated for this problem. Recommend topical treatment with triamcinolone paste. Monitor for new symptoms. F/u as needed.  Primary hypothyroidism For now continue levothyroxine 75 mcg daily, instructed to try medication alone with empty stomach, 45 to 60 minutes before breakfast. We will repeat TSH at next visit.  Periodontal disease This is a chronic problem. We discussed possible complications if not appropriately treated. Recommend establishing care with dentist.She can call her dental insurance to inquire about providers she can see in the area.  We discussed possible serious and likely etiologies, options for evaluation and workup, limitations of telemedicine visit vs in person visit, treatment, treatment risks and precautions. The patient was advised to call back or seek an in-person evaluation if the symptoms worsen or if the condition fails to improve as anticipated. I discussed the assessment and treatment plan with the patient. The patient was provided an opportunity to ask questions and all were answered. The patient agreed with the plan and demonstrated an understanding of the instructions.  Return if symptoms worsen or fail to improve, for Keep next f/u appt..  Alanie Syler G. [name], MD  [facility]. [place] office.

## 2021-08-13 ENCOUNTER — Other Ambulatory Visit: Payer: Self-pay | Admitting: Family Medicine

## 2021-08-13 DIAGNOSIS — G894 Chronic pain syndrome: Secondary | ICD-10-CM

## 2021-08-13 DIAGNOSIS — C3492 Malignant neoplasm of unspecified part of left bronchus or lung: Secondary | ICD-10-CM

## 2021-08-13 NOTE — Telephone Encounter (Signed)
Patient called in to request a refill for oxyCODONE-acetaminophen (PERCOCET/ROXICET) 5-325 MG tablet [[id number]]  to be sent to her pharmacy.  Patient could be contacted at [phone].  Please advise.

## 2021-08-17 MED ORDER — OXYCODONE-ACETAMINOPHEN 5-325 MG PO TABS: 0.5000 | ORAL_TABLET | Freq: Three times a day (TID) | ORAL | 0 refills | Status: DC | PRN

## 2021-08-28 ENCOUNTER — Telehealth: Payer: Self-pay | Admitting: Pharmacist

## 2021-08-28 NOTE — Chronic Care Management (AMB) (Signed)
? ? ?  Chronic Care Management ?Pharmacy Assistant  ? ?Name: Felicia Herrera  MRN: [id number] DOB: 1956-04-21 ? ?08/31/2021 APPOINTMENT REMINDER ? ? ?Called Kari Baars, No answer, left message of appointment on 08/31/2021 at 10:30 via office visit with Jeni Salles, Pharm D. Notified to have all medications, supplements, blood pressure and/or blood sugar logs available during appointment and to return call if need to reschedule. ? ?Care Gaps: ?AWV - scheduled for 09/29/21 ?Last BP - 142/78 on 05/22/2021 ? ?Star Rating Drug: ?Atorvastatin 40mg  - last filled on 06/28/2021 90DS at [facility] ?  ? ?Any gaps in medications fill history? No ? ?Gennie Alma CMA  ?Clinical Pharmacist Assistant ?[phone] ? ?

## 2021-08-29 ENCOUNTER — Other Ambulatory Visit: Payer: Self-pay | Admitting: Family Medicine

## 2021-08-29 DIAGNOSIS — E785 Hyperlipidemia, unspecified: Secondary | ICD-10-CM

## 2021-08-29 DIAGNOSIS — E039 Hypothyroidism, unspecified: Secondary | ICD-10-CM

## 2021-08-31 ENCOUNTER — Ambulatory Visit (INDEPENDENT_AMBULATORY_CARE_PROVIDER_SITE_OTHER): Payer: Medicare Other | Admitting: Pharmacist

## 2021-08-31 VITALS — BP 130/80

## 2021-08-31 DIAGNOSIS — E039 Hypothyroidism, unspecified: Secondary | ICD-10-CM

## 2021-08-31 DIAGNOSIS — Z8709 Personal history of other diseases of the respiratory system: Secondary | ICD-10-CM

## 2021-08-31 NOTE — Progress Notes (Cosign Needed)
Chronic Care Management Pharmacy Note  08/31/2021 Name:  Felicia Herrera MRN:  [id number] DOB:  05-09-1956  Summary: Pt is still not sleeping well overall Pt is now taking 1/2 tab of levothyroxine on Mondays and 1 tab all other days TSH is elevated  Recommendations/Changes made from today's visit: -Recommended melatonin 2 mg 1 hour prior to bedtime -Recommended scheduling follow up with pulmonary team -Recommended purchasing 3 times a day pillbox to help with adherence/organization  Plan: Repeat TSH at PCP visit in April Follow up sleep assessment in 2 months   Subjective: Felicia Herrera is an 66 y.o. year old female who is a primary patient of [name], Malka So, MD.  The CCM team was consulted for assistance with disease management and care coordination needs.    Engaged with patient face to face for follow up visit in response to provider referral for pharmacy case management and/or care coordination services.   Consent to Services:  The patient was given information about Chronic Care Management services, agreed to services, and gave verbal consent prior to initiation of services.  Please see initial visit note for detailed documentation.   Patient Care Team: [name], [name], MD as PCP - General (Family Medicine) Viona Gilmore, [facility] as Pharmacist (Pharmacist) Brand Males, MD as Consulting Physician (Pulmonary Disease)  Recent office visits: 07/22/21 Betty [name], MD: Patient presented for video visit for aphthous ulcer. Prescribed triamcinolone paste.   05/22/2021 Betty [name] MD - Patient was seen for costal margin pain and additional issues. Increased Omeprazole 20 to twice daily. Follow up in 4 months.  Recent consult visits: 04/29/2021 Curt Bears MD (oncology) - Patient was seen for Malignant neoplasm of unspecified part of unspecified bronchus or lung and an additional issue. No medication changes. Follow up in 6 months.  12/01/20 Rexene Edison,  NP (pulmonary): Patient presented for COPD follow up.  Hospital visits: None in previous 6 months  Objective:  Lab Results  Component Value Date   CREATININE 0.73 04/27/2021   BUN 13 04/27/2021   GFR 77.38 07/15/2020   GFRNONAA >60 04/27/2021   GFRAA >60 10/22/2019   NA 142 04/27/2021   K 4.1 04/27/2021   CALCIUM 8.8 (L) 04/27/2021   CO2 28 04/27/2021    Lab Results  Component Value Date/Time   HGBA1C 6.1 11/19/2020 04:05 PM   HGBA1C 6.1 (A) 02/12/2019 05:27 PM   HGBA1C 6.0 10/18/2017 12:13 PM   GFR 77.38 07/15/2020 07:14 AM   GFR 80.21 11/04/2016 12:55 PM    Last diabetic Eye exam: No results found for: HMDIABEYEEXA  Last diabetic Foot exam: No results found for: HMDIABFOOTEX   Lab Results  Component Value Date   CHOL 155 11/19/2020   HDL 51.50 11/19/2020   LDLCALC 75 11/19/2020   TRIG 142.0 11/19/2020   CHOLHDL 3 11/19/2020    Hepatic Function Latest Ref Rng & Units 04/27/2021 10/24/2020 07/15/2020  Total Protein 6.5 - 8.1 g/dL 7.2 7.0 7.7  Albumin 3.5 - 5.0 g/dL 3.4(L) 3.6 4.3  AST 15 - 41 U/L 13(L) 19 16  ALT 0 - 44 U/L $Remo'12 26 11  'ajNBV$ Alk Phosphatase 38 - 126 U/L 111 101 104  Total Bilirubin 0.3 - 1.2 mg/dL 0.3 0.4 0.4    Lab Results  Component Value Date/Time   TSH 7.16 (H) 07/20/2021 10:23 AM   TSH 0.78 07/02/2020 03:37 PM    CBC Latest Ref Rng & Units 04/27/2021 10/24/2020 04/25/2020  WBC 4.0 - 10.5 K/uL 11.3(H) 8.0  6.2  Hemoglobin 12.0 - 15.0 g/dL 10.7(L) 11.7(L) 9.5(L)  Hematocrit 36.0 - 46.0 % 33.7(L) 36.2 31.3(L)  Platelets 150 - 400 K/uL 350 379 437(H)    No results found for: VD25OH  Clinical ASCVD: No  The 10-year ASCVD risk score (Arnett DK, et al., 2019) is: 6.2%   Values used to calculate the score:     Age: 66 years     Sex: Female     Is Non-Hispanic African American: No     Diabetic: No     Tobacco smoker: No     Systolic Blood Pressure: 856 mmHg     Is BP treated: Yes     HDL Cholesterol: 51.5 mg/dL     Total Cholesterol: 155 mg/dL     Depression screen [facility] 2/9 05/22/2021 09/23/2020 02/18/2020  Decreased Interest 0 0 0  Down, Depressed, Hopeless 0 0 0  PHQ - 2 Score 0 0 0  Altered sleeping 0 - -  Tired, decreased energy 0 - -  Change in appetite 0 - -  Feeling bad or failure about yourself  0 - -  Trouble concentrating 0 - -  Moving slowly or fidgety/restless 0 - -  Suicidal thoughts 0 - -  PHQ-9 Score 0 - -  Some recent data might be hidden     Social History   Tobacco Use  Smoking Status Former   Packs/day: 1.00   Years: 30.00   Pack years: 30.00   Types: Cigarettes   Quit date: 06/21/2005   Years since quitting: 16.2  Smokeless Tobacco Never   BP Readings from Last 3 Encounters:  08/31/21 130/80  05/22/21 (!) 142/78  04/29/21 (!) 151/67   Pulse Readings from Last 3 Encounters:  05/22/21 72  04/29/21 84  04/13/21 79   Wt Readings from Last 3 Encounters:  05/22/21 155 lb 6.4 oz (70.5 kg)  04/29/21 159 lb (72.1 kg)  04/13/21 157 lb (71.2 kg)    Assessment/Interventions: Review of patient past medical history, allergies, medications, health status, including review of consultants reports, laboratory and other test data, was performed as part of comprehensive evaluation and provision of chronic care management services.   SDOH:  (Social Determinants of Health) assessments and interventions performed: No   CCM Care Plan  Allergies  Allergen Reactions   Anoro Ellipta [Umeclidinium-Vilanterol]     Taste issues    Medications Reviewed Today     Reviewed by [name], [name], MD (Physician) on 07/22/21 at 1730  Med List Status: <None>   Medication Order Taking? Sig Documenting Provider Last Dose Status Informant  albuterol (VENTOLIN HFA) 108 (90 Base) MCG/ACT inhaler [id number] Yes Inhale 2 puffs into the lungs every 6 (six) hours as needed for wheezing or shortness of breath. Brand Males, MD Taking Active   amLODipine (NORVASC) 5 MG tablet [id number] Yes Take 1 tablet (5 mg total) by mouth  daily. [name], [name], MD Taking Active   aspirin EC 81 MG tablet [id number] Yes Take 81 mg by mouth daily. [provider] Taking Active   atorvastatin (LIPITOR) 40 MG tablet [id number] Yes Take 1 tablet (40 mg total) by mouth daily. [name], [name], MD Taking Active   Azelastine HCl 137 MCG/SPRAY SOLN [id number] Yes USE 2 SPRAY(S) IN EACH NOSTRIL TWICE DAILY AS DIRECTED [name], [name], MD Taking Active   betamethasone dipropionate (DIPROLENE) 0.05 % cream [id number] Yes Apply topically daily as needed. [name], [name], MD Taking Active   clotrimazole-betamethasone Donalynn Furlong) cream [id number]  Yes Apply 1 application topically daily. [name], [name], MD Taking Active   Discontinued 07/22/21 1730   FIBER ADULT GUMMIES PO [id number] Yes Take by mouth. [provider] Taking Active   fluticasone (FLONASE) 50 MCG/ACT nasal spray [id number] Yes Place 1 spray into both nostrils 2 (two) times daily as needed. [name], [name], MD Taking Active   fluticasone furoate-vilanterol (BREO ELLIPTA) 200-25 MCG/ACT AEPB [id number] Yes Inhale 1 puff by mouth once daily Brand Males, MD Taking Active   guaiFENesin (MUCINEX) 600 MG 12 hr tablet [id number] Yes Take 1 tablet (600 mg total) by mouth 2 (two) times daily.  Patient taking differently: Take 600 mg by mouth daily.   Martyn Ehrich, NP Taking Active   levothyroxine (SYNTHROID) 75 MCG tablet [id number] Yes Take 1 tablet (75 mcg total) by mouth daily before breakfast. [name], [name], MD Taking Active   loratadine (CLARITIN) 10 MG tablet [id number] Yes Take 10 mg by mouth daily. [provider] Taking Active   nystatin cream (MYCOSTATIN) [id number] Yes Apply 1 application topically 2 (two) times daily.  Patient taking differently: Apply 1 application topically 2 (two) times daily as needed.   Maryanna Shape, NP Taking Active   omeprazole (PRILOSEC) 20 MG capsule [id number] Yes Take 1 capsule (20 mg total) by mouth 2 (two) times daily  before a meal. [name], [name], MD Taking Active   oxyCODONE-acetaminophen (PERCOCET/ROXICET) 5-325 MG tablet [id number] Yes Take 0.5 tablets by mouth every 8 (eight) hours as needed for severe pain. [name], [name], MD Taking Active   Probiotic Product (PROBIOTIC ADVANCED PO) [id number] Yes Take 1 capsule by mouth daily. [provider] Taking Active   Respiratory Therapy Supplies (FLUTTER) DEVI [id number] Yes Use as directed Martyn Ehrich, NP Taking Active   sodium chloride flush (NS) 0.9 % injection 10 mL [id number]   Curt Bears, MD  Active   triamcinolone (KENALOG) 0.1 % paste [id number] Yes Apply 2 times daily on oral lesion for 14 days. 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   Stage 2 moderate COPD by GOLD classification ([place]) 02/22/2017   Adenocarcinoma of left lung, stage 2 ([place]) 12/27/2016   Encounter for antineoplastic chemotherapy 12/27/2016   Goals of care, counseling/discussion 12/27/2016   GERD (gastroesophageal reflux disease) 12/17/2016   Hypertension, essential, benign 12/17/2016   History of COPD 12/09/2016   History of lung cancer 12/09/2016   Lung cancer ([place]) 11/04/2016   Primary hypothyroidism 11/04/2016    Immunization History  Administered Date(s) Administered   Influenza, Quadrivalent, Recombinant, Inj, Pf 03/12/2018   Influenza,inj,Quad PF,6+ Mos 03/21/2016, 02/22/2017, 03/06/2019, 04/03/2020    Influenza-Unspecified 03/27/2021   PFIZER(Purple Top)SARS-COV-2 Vaccination 09/10/2019, 10/02/2019, 03/18/2020   PNEUMOCOCCAL CONJUGATE-20 04/10/2021   Pfizer Covid-19 Vaccine Bivalent Booster 39yrs & up 03/27/2021   Tdap 06/01/2019   Zoster Recombinat (Shingrix) 06/01/2019, 08/13/2019   Patient is moving to a senior community May 1st and is really looking forward to  it. 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  Patient is still not sleeping well. She did sleep about 9 hours last night though which was out of the ordinary. She has had less stress lately with figuring out everything with her move and that may have helped. She has not cut back on blue light screens prior to bedtime yet but plans to after the move around June. She inquired about a sleep aid over the counter.  Conditions to be addressed/monitored:  Hypertension, Coronary Artery Disease, COPD, Hypothyroidism, Osteoporosis, and lung cancer  Conditions addressed this visit: COPD, hypertension, insomnia  Care Plan : CCM Pharmacy Care Plan  Updates made by Viona Gilmore, [place] since 08/31/2021 12:00 AM     Problem: Problem: Hypertension, Coronary Artery Disease, COPD, Hypothyroidism and lung cancer      Long-Range Goal: Patient-Specific Goal   Start Date: 08/01/2020  Expected End Date: 08/01/2021  Recent Progress: On track  Priority: High  Note:   Current Barriers:  Unable to independently monitor therapeutic efficacy Unable to self administer medications as prescribed  Pharmacist Clinical Goal(s):  Patient will achieve adherence to monitoring guidelines and medication adherence to achieve therapeutic efficacy achieve control of blood pressure as evidenced by home blood pressure monitoring  through collaboration with PharmD and provider.   Interventions: 1:1 collaboration with [name], [name], MD regarding development and update of comprehensive plan of care as  evidenced by provider attestation and co-signature Inter-disciplinary care team collaboration (see longitudinal plan of care) Comprehensive medication review performed; medication list updated in electronic medical record  Hypertension (BP goal <130/80) -Controlled -Current treatment: Amlodipine 5 mg 1/2 tablet daily - Appropriate, Query effective, Safe, Accessible -Medications previously tried: none  -Current home readings: 115/79 (not checking super often) -Current dietary habits: did not discuss -Current exercise habits: did not discuss -Denies hypotensive/hypertensive symptoms -Educated on Importance of home blood pressure monitoring; Proper BP monitoring technique; -Counseled to monitor BP at home weekly, document, and provide log at future appointments -Recommended to continue current medication -Advised on proper use of blood pressure cuff and compared office to home BP readings  Hyperlipidemia/CAD: (LDL goal < 70) -Controlled -Current treatment: Aspirin 81 mg daily - Appropriate, Effective, Safe, Accessible Atorvastatin 40 mg daily - Appropriate, Effective, Safe, Accessible -Medications previously tried: none  -Current dietary patterns: did not discuss -Current exercise habits: did not discuss -Educated on Cholesterol goals;  -Recommended to continue current medication  COPD (Goal: control symptoms and prevent exacerbations) -Uncontrolled -Current treatment  Ventolin HFA 2 puffs every 6 hours as needed  - Appropriate, Effective, Safe, Accessible Breo ellipta 1 puff daily - Appropriate, Effective, Safe, Accessible Flutter device 2-3 times a day -Medications previously tried: Spiriva (difficult to use), Breo (recurrent pneumonia), Anoro (left a weird taste in her mouth) -Gold Grade: Gold 2 (FEV1 50-79%) -Current COPD Classification:  B (high sx, <2 exacerbations/yr) -CAT score: 18 -Pulmonary function testing: 01/17/17 -Exacerbations requiring treatment in last 6 months:  yes -Patient denies consistent use of maintenance inhaler -Frequency of rescue inhaler use: daily -Counseled on Proper inhaler technique; Benefits of consistent maintenance inhaler use Differences between maintenance and rescue inhalers -Recommended to continue current medication  Hypothyroidism (Goal: TSH 2.5-4.5 with osteoporosis diagnosis) -Not ideally controlled -Current treatment  Levothyroxine 75 mcg daily  - 1/2 tablet on Mondays and 1 tablet all other days - Appropriate, Query effective, Safe, Accessible -Medications previously tried: none  -Recommended to  continue current medication  Osteoporosis (Goal prevent fractures) -Uncontrolled -Last DEXA Scan: 12/08/20   T-Score femoral neck: -2.9  T-Score total hip: n/a  T-Score lumbar spine: -1.2  T-Score forearm radius: n/a  10-year probability of major osteoporotic fracture: n/a  10-year probability of hip fracture: n/a -Patient is a candidate for pharmacologic treatment due to T-Score < -2.5 in femoral neck -Current treatment  Calcium Vitamin D -Medications previously tried: none  -Recommend [phone] units of vitamin D daily. Recommend 1200 mg of calcium daily from dietary and supplemental sources. Recommend weight-bearing and muscle strengthening exercises for building and maintaining bone density. -Recommended switching to calcium citrate for better absorption.  Allergic rhinitis (Goal: minimize symptoms of allergies) -Uncontrolled -Current treatment  Flonase 50 mcg/act 1 spray twice daily  - not taking Azelastine 0.1% nasal spray 2 sprays in both nostrils twice daily Saline nasal spray as needed - Appropriate, Query effective, Safe, Accessible Mucinex 600 mg tablets as needed - Appropriate, Query effective, Safe, Accessible Benadryl as needed for insect bites -Medications previously tried: several -Recommended mucinex without DM to try for congestion and if this does not help, try Zyrtec or Claritin over the  counter Educated on the differences between the nasal sprays and various over the counter cold and cough products  GERD (Goal: minimize symptoms of heartburn/acid reflux) -Controlled -Current treatment  Omeprazole 20 mg twice daily - Appropriate, Effective, Safe, Accessible -Medications previously tried: none  -Recommended to continue current medication  Pain (Goal: minimize symptoms of pain) -Controlled -Current treatment  Oxycodone 5-325 mg 1/2 tablet every 6 hours as needed - Appropriate, Effective, Query Safe, Accessible -Medications previously tried: none  -Recommended to continue current medication Counseled on limiting use    Health Maintenance -Vaccine gaps: none -Current therapy:  Betamethasone 0.05% cream  Clotrimazole 1% cream  Docusate 100 mg BID (stopped diarrhea)  Nystatin cream Prochlorperazine 10 mg q6hr Probiotic 1 tablet daily -Educated on Supplements may interfere with prescription drugs -Patient is satisfied with current therapy and denies issues -Recommended to continue current medication  Patient Goals/Self-Care Activities Patient will:  - take medications as prescribed check blood pressure weekly, document, and provide at future appointments  Follow Up Plan: Face to Face appointment with care management team member scheduled for:  6 months      Medication Assistance: None required.  Patient affirms current coverage meets needs.  Compliance/Adherence/Medication fill history: Care Gaps: Last BP - 142/78 on 05/22/2021  Star-Rating Drugs: Atorvastatin 40mg  - last filled on 06/28/2021 90DS at [facility]  Patient's preferred pharmacy is:  [facility] [address] [place] - [place] [address] [place] [place] [address] Phone: [phone] Fax: [phone]  [facility] ([facility] ) - [place], [place] [address] Phone: [phone] Fax:  [phone]   Uses pill box? Yes - uses a one a day Pt endorses 99% compliance  We discussed: Current pharmacy is preferred with insurance plan and patient is satisfied with pharmacy services Patient decided to: Continue current medication management strategy  Care Plan and Follow Up Patient Decision:  Patient agrees to Care Plan and Follow-up.  Plan: Face to Face appointment with care management team member scheduled for: 6 months  Jeni Salles, PharmD [place] Pharmacist [place] [facility] [place] [phone]

## 2021-08-31 NOTE — Patient Instructions (Signed)
Hi Ellice, ? ?It was great to catch up! I hope all goes well with the move. ? ?As we discussed, go ahead and try melatonin 2 mg 1 hour before bedtime to see if this helps with sleep. Also, don't forget to look for a pillbox with 3 different times of day to help make sure you are getting everything in and staying organized. ? ?Please reach out to me if you have any questions or need anything before our follow up! ? ?Best, ?Maddie ? ?Jeni Salles, PharmD, BCACP ?Clinical Pharmacist ?Therapist, music at [place] ?[phone] ? ? Visit Information ? ? Goals Addressed   ?None ?  ? ?Patient Care Plan: [place]  ?  ? ?Problem Identified: Problem: Hypertension, Coronary Artery Disease, COPD, Hypothyroidism and lung cancer   ?  ? ?Long-Range Goal: Patient-Specific Goal   ?Start Date: 08/01/2020  ?Expected End Date: 08/01/2021  ?Recent Progress: On track  ?Priority: High  ?Note:   ?Current Barriers:  ?Unable to independently monitor therapeutic efficacy ?Unable to self administer medications as prescribed ? ?Pharmacist Clinical Goal(s):  ?Patient will achieve adherence to monitoring guidelines and medication adherence to achieve therapeutic efficacy ?achieve control of blood pressure as evidenced by home blood pressure monitoring  through collaboration with PharmD and provider.  ? ?Interventions: ?1:1 collaboration with [name], [name], MD regarding development and update of comprehensive plan of care as evidenced by provider attestation and co-signature ?Inter-disciplinary care team collaboration (see longitudinal plan of care) ?Comprehensive medication review performed; medication list updated in electronic medical record ? ?Hypertension (BP goal <130/80) ?-Controlled ?-Current treatment: ?Amlodipine 5 mg 1/2 tablet daily - Appropriate, Query effective, Safe, Accessible ?-Medications previously tried: none  ?-Current home readings: 115/79 (not checking super often) ?-Current dietary habits: did not  discuss ?-Current exercise habits: did not discuss ?-Denies hypotensive/hypertensive symptoms ?-Educated on Importance of home blood pressure monitoring; ?Proper BP monitoring technique; ?-Counseled to monitor BP at home weekly, document, and provide log at future appointments ?-Recommended to continue current medication ?-Advised on proper use of blood pressure cuff and compared office to home BP readings ? ?Hyperlipidemia/CAD: (LDL goal < 70) ?-Controlled ?-Current treatment: ?Aspirin 81 mg daily - Appropriate, Effective, Safe, Accessible ?Atorvastatin 40 mg daily - Appropriate, Effective, Safe, Accessible ?-Medications previously tried: none  ?-Current dietary patterns: did not discuss ?-Current exercise habits: did not discuss ?-Educated on Cholesterol goals;  ?-Recommended to continue current medication ? ?COPD (Goal: control symptoms and prevent exacerbations) ?-Uncontrolled ?-Current treatment  ?Ventolin HFA 2 puffs every 6 hours as needed  - Appropriate, Effective, Safe, Accessible ?Breo ellipta 1 puff daily - Appropriate, Effective, Safe, Accessible ?Flutter device 2-3 times a day ?-Medications previously tried: Spiriva (difficult to use), Breo (recurrent pneumonia), Anoro (left a weird taste in her mouth) ?-Gold Grade: Gold 2 (FEV1 50-79%) ?-Current COPD Classification:  B (high sx, <2 exacerbations/yr) ?-CAT score: 18 ?-Pulmonary function testing: 01/17/17 ?-Exacerbations requiring treatment in last 6 months: yes ?-Patient denies consistent use of maintenance inhaler ?-Frequency of rescue inhaler use: daily ?-Counseled on Proper inhaler technique; ?Benefits of consistent maintenance inhaler use ?Differences between maintenance and rescue inhalers ?-Recommended to continue current medication ? ?Hypothyroidism (Goal: TSH 2.5-4.5 with osteoporosis diagnosis) ?-Not ideally controlled ?-Current treatment  ?Levothyroxine 75 mcg daily  - 1/2 tablet on Mondays and 1 tablet all other days - Appropriate, Query  effective, Safe, Accessible ?-Medications previously tried: none  ?-Recommended to continue current medication ? ?Osteoporosis (Goal prevent fractures) ?-Uncontrolled ?-Last DEXA Scan: 12/08/20  ?  T-Score femoral neck: -2.9 ? T-Score total hip: n/a ? T-Score lumbar spine: -1.2 ? T-Score forearm radius: n/a ? 10-year probability of major osteoporotic fracture: n/a ? 10-year probability of hip fracture: n/a ?-Patient is a candidate for pharmacologic treatment due to T-Score < -2.5 in femoral neck ?-Current treatment  ?Calcium ?Vitamin D ?-Medications previously tried: none  ?-Recommend [phone] units of vitamin D daily. Recommend 1200 mg of calcium daily from dietary and supplemental sources. Recommend weight-bearing and muscle strengthening exercises for building and maintaining bone density. ?-Recommended switching to calcium citrate for better absorption. ? ?Allergic rhinitis (Goal: minimize symptoms of allergies) ?-Uncontrolled ?-Current treatment  ?Flonase 50 mcg/act 1 spray twice daily  - not taking ?Azelastine 0.1% nasal spray 2 sprays in both nostrils twice daily ?Saline nasal spray as needed - Appropriate, Query effective, Safe, Accessible ?Mucinex 600 mg tablets as needed - Appropriate, Query effective, Safe, Accessible ?Benadryl as needed for insect bites ?-Medications previously tried: several ?-Recommended mucinex without DM to try for congestion and if this does not help, try Zyrtec or Claritin over the counter ?Educated on the differences between the nasal sprays and various over the counter cold and cough products ? ?GERD (Goal: minimize symptoms of heartburn/acid reflux) ?-Controlled ?-Current treatment  ?Omeprazole 20 mg twice daily - Appropriate, Effective, Safe, Accessible ?-Medications previously tried: none  ?-Recommended to continue current medication ? ?Pain (Goal: minimize symptoms of pain) ?-Controlled ?-Current treatment  ?Oxycodone 5-325 mg 1/2 tablet every 6 hours as needed - Appropriate,  Effective, Query Safe, Accessible ?-Medications previously tried: none  ?-Recommended to continue current medication ?Counseled on limiting use  ? ? ?Health Maintenance ?-Vaccine gaps: none ?-Current therapy:  ?Betamethasone 0.05% cream  ?Clotrimazole 1% cream  ?Docusate 100 mg BID (stopped diarrhea)  ?Nystatin cream ?Prochlorperazine 10 mg q6hr ?Probiotic 1 tablet daily ?-Educated on Supplements may interfere with prescription drugs ?-Patient is satisfied with current therapy and denies issues ?-Recommended to continue current medication ? ?Patient Goals/Self-Care Activities ?Patient will:  ?- take medications as prescribed ?check blood pressure weekly, document, and provide at future appointments ? ?Follow Up Plan: Face to Face appointment with care management team member scheduled for:  6 months ? ?  ?  ? ?Patient verbalizes understanding of instructions and care plan provided today and agrees to view in [place]. Active MyChart status confirmed with patient.   ?Telephone follow up appointment with pharmacy team member scheduled for: 6 months ? ?Viona Gilmore, RPH  ?

## 2021-09-14 ENCOUNTER — Other Ambulatory Visit: Payer: Self-pay | Admitting: Family Medicine

## 2021-09-14 DIAGNOSIS — C3492 Malignant neoplasm of unspecified part of left bronchus or lung: Secondary | ICD-10-CM

## 2021-09-14 DIAGNOSIS — G894 Chronic pain syndrome: Secondary | ICD-10-CM

## 2021-09-14 NOTE — Telephone Encounter (Signed)
Okay for refill?  

## 2021-09-14 NOTE — Telephone Encounter (Signed)
Pt is calling and needs a refill on oxyCODONE-acetaminophen (PERCOCET/ROXICET) 5-325 MG tablet  ?[place], [place] - [address] Phone:  [phone]  ?Fax:  [phone]  ?  ? ?

## 2021-09-15 MED ORDER — OXYCODONE-ACETAMINOPHEN 5-325 MG PO TABS: 0.5000 | ORAL_TABLET | Freq: Three times a day (TID) | ORAL | 0 refills | Status: DC | PRN

## 2021-09-18 DIAGNOSIS — E039 Hypothyroidism, unspecified: Secondary | ICD-10-CM | POA: Diagnosis not present

## 2021-09-18 NOTE — Progress Notes (Signed)
? ?HPI: ?[name] is a 66 y.o. female, who is here today for follow up. ?She was seen last on 07/22/21, virtual. ? ?COPD: She has seen her pulmonologist on 04/29/21, next appt in 10/2021. ?Negative for worsening cough, DOE,or wheezing. ? ?Hypothyroidism: Dx'ed 20+ years ago. ?She is on Levothyroxine 75 mcg daily x 6 d and 1/2 tab Sundays. ?She is taking medication before breakfast and empty stomach. ? ?Lab Results  ?Component Value Date  ? TSH 7.16 (H) 07/20/2021  ? ?Aortic atherosclerosis seen on imaging. ?She is on Atorvastatin 40 mg daily. ?Lab Results  ?Component Value Date  ? CHOL 155 11/19/2020  ? HDL 51.50 11/19/2020  ? [place] 75 11/19/2020  ? TRIG 142.0 11/19/2020  ? CHOLHDL 3 11/19/2020  ? ?Chronic pain: Residual right thoracic pain around scar s/p right lobectomy due to lung cancer. ?She is on Percocet 5-325 mg 1/2 tab tid. This regimen has helped with costal pain. ?Pain can be 10/10 if she misses medication. ?Negative for new symptoms. ? ?She is tolerating medication well, no side effects. ?No constipation. ?She is moving to a new place on 10/16/21, she will not have stairs. ? ?Today she woke up with right earache and epiphora + eye/nose pruritus. ?She has taken Loratadine 10 mg. ? ?Negative for sick contact or recently travel. ?Negative for fever,chills,unusual fatigue, or changes in hearing. ? ?Hypertension:  ?Medications:Amlodipine 5 mg daily. ?Side effects:None. ?Negative for unusual or severe headache, visual changes, exertional chest pain, dyspnea,  focal weakness, or edema. ? ?Lab Results  ?Component Value Date  ? CREATININE 0.73 04/27/2021  ? BUN 13 04/27/2021  ? NA 142 04/27/2021  ? K 4.1 04/27/2021  ? CL 105 04/27/2021  ? CO2 28 04/27/2021  ? ?Review of Systems  ?Constitutional:  Positive for fatigue. Negative for activity change, appetite change and fever.  ?HENT:  Negative for mouth sores, nosebleeds and sore throat.   ?Eyes:  Positive for itching. Negative for discharge and  redness.  ?Respiratory:  Negative for cough and wheezing.   ?Gastrointestinal:  Negative for abdominal pain, nausea and vomiting.  ?     Negative for changes in bowel habits.  ?Genitourinary:  Negative for decreased urine volume, difficulty urinating and hematuria.  ?Skin:  Negative for rash.  ?Allergic/Immunologic: Positive for environmental allergies.  ?Neurological:  Negative for syncope and facial asymmetry.  ?Rest see pertinent positives and negatives per HPI. ? ?Current Outpatient Medications on File Prior to Visit  ?Medication Sig Dispense Refill  ? albuterol (VENTOLIN HFA) 108 (90 Base) MCG/ACT inhaler Inhale 2 puffs into the lungs every 6 (six) hours as needed for wheezing or shortness of breath. 8 g 6  ? amLODipine (NORVASC) 5 MG tablet Take 1 tablet (5 mg total) by mouth daily. 90 tablet 3  ? aspirin EC 81 MG tablet Take 81 mg by mouth daily.    ? atorvastatin (LIPITOR) 40 MG tablet TAKE 1 TABLET BY MOUTH  DAILY 90 tablet 3  ? Azelastine HCl 137 MCG/SPRAY SOLN USE 2 SPRAY(S) IN EACH NOSTRIL TWICE DAILY AS DIRECTED 30 mL 2  ? betamethasone dipropionate (DIPROLENE) 0.05 % cream Apply topically daily as needed. 30 g 2  ? clotrimazole-betamethasone (LOTRISONE) cream Apply 1 application topically daily. 30 g 1  ? FIBER ADULT GUMMIES PO Take by mouth.    ? fluticasone (FLONASE) 50 MCG/ACT nasal spray Place 1 spray into both nostrils 2 (two) times daily as needed. 16 g 3  ? fluticasone furoate-vilanterol (BREO ELLIPTA) 200-25  MCG/ACT AEPB Inhale 1 puff by mouth once daily 60 each 5  ? guaiFENesin (MUCINEX) 600 MG 12 hr tablet Take 1 tablet (600 mg total) by mouth 2 (two) times daily. (Patient taking differently: Take 600 mg by mouth daily.) 60 tablet 2  ? levothyroxine (SYNTHROID) 75 MCG tablet TAKE 1 TABLET BY MOUTH  DAILY BEFORE BREAKFAST (Patient taking differently: Taking 1/2 tablet on Sundays and 1 tablet all other days) 90 tablet 3  ? loratadine (CLARITIN) 10 MG tablet Take 10 mg by mouth daily.    ?  nystatin cream (MYCOSTATIN) Apply 1 application topically 2 (two) times daily. (Patient taking differently: Apply 1 application. topically 2 (two) times daily as needed.) 30 g 0  ? omeprazole (PRILOSEC) 20 MG capsule Take 1 capsule (20 mg total) by mouth 2 (two) times daily before a meal. 180 capsule 2  ? oxyCODONE-acetaminophen (PERCOCET/ROXICET) 5-325 MG tablet Take 0.5 tablets by mouth every 8 (eight) hours as needed for severe pain. 45 tablet 0  ? Probiotic Product (PROBIOTIC ADVANCED PO) Take 1 capsule by mouth daily.    ? Respiratory Therapy Supplies (FLUTTER) DEVI Use as directed 1 each 0  ? triamcinolone (KENALOG) 0.1 % paste Apply 2 times daily on oral lesion for 14 days. 5 g 0  ? ?Current Facility-Administered Medications on File Prior to Visit  ?Medication Dose Route Frequency Provider Last Rate Last Admin  ? sodium chloride flush (NS) 0.9 % injection 10 mL  10 mL Intracatheter PRN Curt Bears, MD   10 mL at 06/22/17 1747  ? ? ?Past Medical History:  ?Diagnosis Date  ? Adenocarcinoma of left lung, stage 2 ([place]) 12/27/2016  ? Cancer [facility])   ? Lun cancer: Right Dx 2008, s/p lobectomy. Left Dx 09/2016  ? COPD (chronic obstructive pulmonary disease) ([place])   ? Encounter for antineoplastic chemotherapy 12/27/2016  ? History of radiation therapy 01/24/17-03/08/17  ? left lung 2 Gy in 30 fractions  ? Hyperlipidemia   ? Hypertension   ? Stroke [facility])   ? ?Allergies  ?Allergen Reactions  ? Anoro Ellipta [Umeclidinium-Vilanterol]   ?  Taste issues  ? ? ?Social History  ? ?Socioeconomic History  ? Marital status: Single  ?  Spouse name: Not on file  ? Number of children: Not on file  ? Years of education: Not on file  ? Highest education level: Not on file  ?Occupational History  ? Occupation: landscaping  ?Tobacco Use  ? Smoking status: Former  ?  Packs/day: 1.00  ?  Years: 30.00  ?  Pack years: 30.00  ?  Types: Cigarettes  ?  Quit date: 06/21/2005  ?  Years since quitting: 16.2  ? Smokeless tobacco: Never  ?Vaping Use  ?  Vaping Use: Never used  ?Substance and Sexual Activity  ? Alcohol use: No  ? Drug use: No  ? Sexual activity: Not Currently  ?Other Topics Concern  ? Not on file  ?Social History Narrative  ? Landscaper.   ? Lives alone.   ? ?Social Determinants of Health  ? ?Financial Resource Strain: Low Risk   ? Difficulty of Paying Living Expenses: Not hard at all  ?Food Insecurity: No Food Insecurity  ? Worried About Charity fundraiser in the Last Year: Never true  ? Ran Out of Food in the Last Year: Never true  ?Transportation Needs: No Transportation Needs  ? Lack of Transportation (Medical): No  ? Lack of Transportation (Non-Medical): No  ?Physical Activity: Insufficiently Active  ?  Days of Exercise per Week: 6 days  ? Minutes of Exercise per Session: 20 min  ?Stress: No Stress Concern Present  ? Feeling of Stress : Not at all  ?Social Connections: Socially Isolated  ? Frequency of Communication with Friends and Family: More than three times a week  ? Frequency of Social Gatherings with Friends and Family: Three times a week  ? Attends Religious Services: Never  ? Active Member of Clubs or Organizations: No  ? Attends Archivist Meetings: Never  ? Marital Status: Never married  ? ?Vitals:  ? 09/21/21 0939  ?BP: 132/80  ?Pulse: 87  ?Resp: 16  ?SpO2: 90%  ? ?Wt Readings from Last 3 Encounters:  ?09/21/21 159 lb 4 oz (72.2 kg)  ?05/22/21 155 lb 6.4 oz (70.5 kg)  ?04/29/21 159 lb (72.1 kg)  ? ?Body mass index is 29.13 kg/m?. ? ?Physical Exam ?Vitals and nursing note reviewed.  ?Constitutional:   ?   General: She is not in acute distress. ?   Appearance: She is well-developed.  ?HENT:  ?   Head: Normocephalic and atraumatic.  ?   Right Ear: Tympanic membrane, ear canal and external ear normal.  ?   Left Ear: Tympanic membrane, ear canal and external ear normal.  ?   Nose:  ?   Right Turbinates: Not enlarged.  ?   Left Turbinates: Not enlarged.  ?   Right Sinus: No maxillary sinus tenderness or frontal sinus tenderness.   ?   Left Sinus: No maxillary sinus tenderness or frontal sinus tenderness.  ?   Mouth/Throat:  ?   Mouth: Mucous membranes are moist.  ?   Pharynx: Oropharynx is clear.  ?Eyes:  ?   Conjunctiva/sclera: Conjunc

## 2021-09-21 ENCOUNTER — Ambulatory Visit (INDEPENDENT_AMBULATORY_CARE_PROVIDER_SITE_OTHER): Payer: Medicare Other | Admitting: Family Medicine

## 2021-09-21 ENCOUNTER — Encounter: Payer: Self-pay | Admitting: Family Medicine

## 2021-09-21 ENCOUNTER — Ambulatory Visit: Payer: Medicare Other | Admitting: Family Medicine

## 2021-09-21 VITALS — BP 132/80 | HR 87 | Resp 16 | Ht 62.0 in | Wt 159.2 lb

## 2021-09-21 DIAGNOSIS — H9201 Otalgia, right ear: Secondary | ICD-10-CM | POA: Diagnosis not present

## 2021-09-21 DIAGNOSIS — J310 Chronic rhinitis: Secondary | ICD-10-CM

## 2021-09-21 DIAGNOSIS — G894 Chronic pain syndrome: Secondary | ICD-10-CM

## 2021-09-21 DIAGNOSIS — I700 Atherosclerosis of aorta: Secondary | ICD-10-CM | POA: Diagnosis not present

## 2021-09-21 DIAGNOSIS — E039 Hypothyroidism, unspecified: Secondary | ICD-10-CM | POA: Diagnosis not present

## 2021-09-21 DIAGNOSIS — J449 Chronic obstructive pulmonary disease, unspecified: Secondary | ICD-10-CM

## 2021-09-21 DIAGNOSIS — C3492 Malignant neoplasm of unspecified part of left bronchus or lung: Secondary | ICD-10-CM

## 2021-09-21 DIAGNOSIS — I10 Essential (primary) hypertension: Secondary | ICD-10-CM

## 2021-09-21 LAB — TSH: TSH: 2.57 u[IU]/mL (ref 0.35–5.50)

## 2021-09-21 NOTE — Assessment & Plan Note (Addendum)
BP adequately controlled. ?Continue Amlodipine 5 mg daily. ?Low salt diet to continue. ?She has CMP and CBC regularly when following with pulmonologist. ?

## 2021-09-21 NOTE — Assessment & Plan Note (Signed)
Following with pulmonologist. ?

## 2021-09-21 NOTE — Assessment & Plan Note (Signed)
Resume Loratadine 10 mg daily. ?Nasal saline irrigations as needed. ?

## 2021-09-21 NOTE — Assessment & Plan Note (Addendum)
Pain is adequately controlled. ?Continue Percocet 5-325 mg 1/2 tab tid. ?We discussed some side effects. ?Med contract current. ?PMP reviewed. ?

## 2021-09-21 NOTE — Patient Instructions (Addendum)
A few things to remember from today's visit: ? ?Primary hypothyroidism - Plan: TSH ? ?Chronic pain disorder ? ?Atherosclerosis of aorta ([place]) ? ?If you need refills please call your pharmacy. ?Do not use My Chart to request refills or for acute issues that need immediate attention. ?  ?No changes today. ?Next visit we will check cholesterol and check for diabetes. ? ?Please be sure medication list is accurate. ?If a new problem present, please set up appointment sooner than planned today. ? ? ? ? ? ? ? ?

## 2021-09-21 NOTE — Assessment & Plan Note (Signed)
Symptoms reported as stable. ?Following with pulmonologist. ?

## 2021-09-21 NOTE — Assessment & Plan Note (Addendum)
Continue Atorvastatin 40 mg daily and low fat diet. ?Continue Aspirin 81 mg daily. ?FLP next visit. ?

## 2021-09-21 NOTE — Assessment & Plan Note (Signed)
Continue same dose of Levothyroxine. ?TSH ordered today, further recommendations according to result. ?

## 2021-09-29 ENCOUNTER — Ambulatory Visit: Payer: Medicare Other

## 2021-10-14 ENCOUNTER — Other Ambulatory Visit: Payer: Self-pay | Admitting: Family Medicine

## 2021-10-14 DIAGNOSIS — G894 Chronic pain syndrome: Secondary | ICD-10-CM

## 2021-10-14 DIAGNOSIS — C3492 Malignant neoplasm of unspecified part of left bronchus or lung: Secondary | ICD-10-CM

## 2021-10-14 NOTE — Telephone Encounter (Signed)
Pt call and stated she need a refill on her oxyCODONE-acetaminophen (PERCOCET/ROXICET) 5-325 MG tablet  sent to  ?[place], [place] - [address] Phone:  [phone]  ?Fax:  [phone]  ?  ? ?

## 2021-10-15 ENCOUNTER — Other Ambulatory Visit: Payer: Self-pay | Admitting: Family Medicine

## 2021-10-15 DIAGNOSIS — L304 Erythema intertrigo: Secondary | ICD-10-CM

## 2021-10-15 DIAGNOSIS — E785 Hyperlipidemia, unspecified: Secondary | ICD-10-CM

## 2021-10-15 NOTE — Telephone Encounter (Signed)
Pt call and stated she need a refill on nystatin cream (MYCOSTATIN sent to  ?[place], [place] - [address] Phone:  [phone]  ?Fax:  [phone]  ?  ? ?

## 2021-10-15 NOTE — Telephone Encounter (Signed)
error 

## 2021-10-16 MED ORDER — OXYCODONE-ACETAMINOPHEN 5-325 MG PO TABS: 0.5000 | ORAL_TABLET | Freq: Three times a day (TID) | ORAL | 0 refills | Status: DC | PRN

## 2021-10-20 MED ORDER — NYSTATIN 100000 UNIT/GM EX CREA
1.0000 | TOPICAL_CREAM | Freq: Two times a day (BID) | CUTANEOUS | 0 refills | Status: AC
Start: 2021-10-20 — End: ?

## 2021-10-23 ENCOUNTER — Telehealth: Payer: Self-pay | Admitting: Pharmacist

## 2021-10-23 ENCOUNTER — Other Ambulatory Visit: Payer: Self-pay

## 2021-10-23 ENCOUNTER — Ambulatory Visit (HOSPITAL_COMMUNITY)
Admission: RE | Admit: 2021-10-23 | Discharge: 2021-10-23 | Disposition: A | Discharge status: Home / Self Care | Payer: Medicare Other | Source: Ambulatory Visit | Attending: Internal Medicine | Admitting: Internal Medicine

## 2021-10-23 ENCOUNTER — Inpatient Hospital Stay: Payer: Medicare Other | Attending: Internal Medicine

## 2021-10-23 DIAGNOSIS — I10 Essential (primary) hypertension: Secondary | ICD-10-CM | POA: Insufficient documentation

## 2021-10-23 DIAGNOSIS — C3490 Malignant neoplasm of unspecified part of unspecified bronchus or lung: Secondary | ICD-10-CM

## 2021-10-23 DIAGNOSIS — C3412 Malignant neoplasm of upper lobe, left bronchus or lung: Secondary | ICD-10-CM | POA: Insufficient documentation

## 2021-10-23 LAB — CBC WITH DIFFERENTIAL (CANCER CENTER ONLY)
Abs Immature Granulocytes: 0.01 10*3/uL (ref 0.00–0.07)
Basophils Absolute: 0.1 10*3/uL (ref 0.0–0.1)
Basophils Relative: 1 %
Eosinophils Absolute: 0.2 10*3/uL (ref 0.0–0.5)
Eosinophils Relative: 3 %
HCT: 33.6 % — ABNORMAL LOW (ref 36.0–46.0)
Hemoglobin: 10.4 g/dL — ABNORMAL LOW (ref 12.0–15.0)
Immature Granulocytes: 0 %
Lymphocytes Relative: 18 %
Lymphs Abs: 1.3 10*3/uL (ref 0.7–4.0)
MCH: 25.6 pg — ABNORMAL LOW (ref 26.0–34.0)
MCHC: 31 g/dL (ref 30.0–36.0)
MCV: 82.8 fL (ref 80.0–100.0)
Monocytes Absolute: 0.7 10*3/uL (ref 0.1–1.0)
Monocytes Relative: 9 %
Neutro Abs: 5 10*3/uL (ref 1.7–7.7)
Neutrophils Relative %: 69 %
Platelet Count: 406 10*3/uL — ABNORMAL HIGH (ref 150–400)
RBC: 4.06 MIL/uL (ref 3.87–5.11)
RDW: 15 % (ref 11.5–15.5)
WBC Count: 7.3 10*3/uL (ref 4.0–10.5)
nRBC: 0 % (ref 0.0–0.2)

## 2021-10-23 LAB — CMP (CANCER CENTER ONLY)
ALT: 12 U/L (ref 0–44)
AST: 14 U/L — ABNORMAL LOW (ref 15–41)
Albumin: 4 g/dL (ref 3.5–5.0)
Alkaline Phosphatase: 92 U/L (ref 38–126)
Anion gap: 6 (ref 5–15)
BUN: 20 mg/dL (ref 8–23)
CO2: 30 mmol/L (ref 22–32)
Calcium: 9 mg/dL (ref 8.9–10.3)
Chloride: 104 mmol/L (ref 98–111)
Creatinine: 0.73 mg/dL (ref 0.44–1.00)
GFR, Estimated: >60 mL/min (ref >60)
Glucose, Bld: 90 mg/dL (ref 70–99)
Potassium: 4 mmol/L (ref 3.5–5.1)
Sodium: 140 mmol/L (ref 135–145)
Total Bilirubin: 0.3 mg/dL (ref 0.3–1.2)
Total Protein: 7.5 g/dL (ref 6.5–8.1)

## 2021-10-23 MED ORDER — IOHEXOL 350 MG/ML SOLN
75.0000 mL | Freq: Once | INTRAVENOUS | Status: DC | PRN
Start: 2021-10-23 — End: 2021-10-24

## 2021-10-23 MED ORDER — SODIUM CHLORIDE (PF) 0.9 % IJ SOLN
INTRAMUSCULAR | Status: AC
  Filled 2021-10-23: qty 50

## 2021-10-23 MED ORDER — IOHEXOL 300 MG/ML  SOLN
75.0000 mL | Freq: Once | INTRAMUSCULAR | Status: AC | PRN
  Administered 2021-10-23: 75 mL via INTRAVENOUS

## 2021-10-23 MED ORDER — HEPARIN SOD (PORK) LOCK FLUSH 100 UNIT/ML IV SOLN
INTRAVENOUS | Status: AC
  Administered 2021-10-23: 500 [IU]
  Filled 2021-10-23: qty 5

## 2021-10-23 NOTE — Chronic Care Management (AMB) (Signed)
    Chronic Care Management Pharmacy Assistant   Name: Gloriann Riede  MRN: [id number] DOB: [birth year]  Reason for Encounter: Follow up [place] Follow up with patient, how is she sleeping since starting melatonin? Is it helping? Verify the dose she is taking.   Unable to reach patient after several attempts.  [place] Pharmacist Assistant [phone]

## 2021-10-26 ENCOUNTER — Other Ambulatory Visit (HOSPITAL_COMMUNITY): Payer: Self-pay

## 2021-10-26 ENCOUNTER — Encounter: Payer: Self-pay | Admitting: Internal Medicine

## 2021-10-26 MED ORDER — ATORVASTATIN CALCIUM 40 MG PO TABS
ORAL_TABLET | ORAL | 3 refills | Status: AC
  Filled 2021-10-26 – 2022-01-12 (×2): qty 90, 90d supply, fill #0

## 2021-10-26 MED FILL — Levothyroxine Sodium Tab 75 MCG: ORAL | 90 days supply | Qty: 90 | Fill #0 | Status: AC

## 2021-10-28 ENCOUNTER — Inpatient Hospital Stay (HOSPITAL_BASED_OUTPATIENT_CLINIC_OR_DEPARTMENT_OTHER): Payer: Medicare Other | Admitting: Internal Medicine

## 2021-10-28 ENCOUNTER — Other Ambulatory Visit: Payer: Self-pay

## 2021-10-28 VITALS — BP 154/67 | HR 65 | Temp 97.0°F | Resp 16 | Wt 157.1 lb

## 2021-10-28 DIAGNOSIS — C3492 Malignant neoplasm of unspecified part of left bronchus or lung: Secondary | ICD-10-CM

## 2021-10-28 DIAGNOSIS — I10 Essential (primary) hypertension: Secondary | ICD-10-CM | POA: Diagnosis not present

## 2021-10-28 DIAGNOSIS — C3490 Malignant neoplasm of unspecified part of unspecified bronchus or lung: Secondary | ICD-10-CM | POA: Diagnosis not present

## 2021-10-28 DIAGNOSIS — C3412 Malignant neoplasm of upper lobe, left bronchus or lung: Secondary | ICD-10-CM | POA: Diagnosis not present

## 2021-10-28 NOTE — Progress Notes (Signed)
.mo ? ?    [place] ?Telephone:([phone]) [phone]   [phone] ? ?OFFICE PROGRESS NOTE ? ?[name], [name], MD ?[place] ?[place] [address] ? ?DIAGNOSIS: Stage IIB (T2b, N1, M0) non-small cell lung cancer, adenocarcinoma presented with large left upper lobe lung mass in addition to left hilar lymphadenopathy diagnosed in April 2018. Her previous workup was done at [facility] in [facility]. There was insufficient material to perform the molecular studies. ? ?GUARDANT 360 Molecular studies: BRCA2 N335fs. Negative for EGFR, ALK, ROS1, BRAF mutations. ? ?PRIOR THERAPY:  ?1) A course of concurrent chemoradiation with weekly carboplatin for AUC of 2 and paclitaxel 45 MG/M2. First dose 01/24/2017. Status post 6 cycles. ?2) Consolidation immunotherapy with Imfinzi (Durvalumab) 10 mg/KG every 2 weeks status post 26 cycles. ? ? ?CURRENT THERAPY: Observation. ? ?INTERVAL HISTORY: ?Felicia Herrera 66 y.o. female returns to the clinic today for follow-up visit.  The patient is feeling fine today with no concerning complaints.  She denied having any current chest pain, shortness of breath, cough or hemoptysis.  She has no nausea, vomiting, diarrhea or constipation.  She has no headache or visual changes.  She has no recent weight loss or night sweats.  She moved recently to [place].  She is here today for evaluation with repeat CT scan of the chest for restaging of her disease. ?MEDICAL HISTORY: ?Past Medical History:  ?Diagnosis Date  ? Adenocarcinoma of left lung, stage 2 ([place]) 12/27/2016  ? Cancer [facility])   ? Lun cancer: Right Dx 2008, s/p lobectomy. Left Dx 09/2016  ? COPD (chronic obstructive pulmonary disease) ([place])   ? Encounter for antineoplastic chemotherapy 12/27/2016  ? History of radiation therapy 01/24/17-03/08/17  ? left lung 2 Gy in 30 fractions  ? Hyperlipidemia   ? Hypertension   ? Stroke [facility])   ? ? ?ALLERGIES:  is allergic to anoro ellipta  [umeclidinium-vilanterol]. ? ?MEDICATIONS:  ?Current Outpatient Medications  ?Medication Sig Dispense Refill  ? albuterol (VENTOLIN HFA) 108 (90 Base) MCG/ACT inhaler Inhale 2 puffs into the lungs every 6 (six) hours as needed for wheezing or shortness of breath. 8 g 6  ? amLODipine (NORVASC) 5 MG tablet Take 1 tablet (5 mg total) by mouth daily. 90 tablet 3  ? aspirin EC 81 MG tablet Take 81 mg by mouth daily.    ? atorvastatin (LIPITOR) 40 MG tablet Take 1 tablet by mouth once daily 90 tablet 2  ? atorvastatin (LIPITOR) 40 MG tablet Take 1 tablet by mouth daily. 90 tablet 3  ? Azelastine HCl 137 MCG/SPRAY SOLN USE 2 SPRAY(S) IN EACH NOSTRIL TWICE DAILY AS DIRECTED 30 mL 2  ? betamethasone dipropionate (DIPROLENE) 0.05 % cream Apply topically daily as needed. 30 g 2  ? clotrimazole-betamethasone (LOTRISONE) cream APPLY ONE APPLICATION TOPICALLY DAILY. 30 g 0  ? FIBER ADULT GUMMIES PO Take by mouth.    ? fluticasone (FLONASE) 50 MCG/ACT nasal spray Place 1 spray into both nostrils 2 (two) times daily as needed. 16 g 3  ? fluticasone furoate-vilanterol (BREO ELLIPTA) 200-25 MCG/ACT AEPB Inhale 1 puff by mouth once daily 60 each 5  ? guaiFENesin (MUCINEX) 600 MG 12 hr tablet Take 1 tablet (600 mg total) by mouth 2 (two) times daily. (Patient taking differently: Take 600 mg by mouth daily.) 60 tablet 2  ? levothyroxine (SYNTHROID) 75 MCG tablet TAKE 1 TABLET BY MOUTH  DAILY BEFORE BREAKFAST 90 tablet 3  ? loratadine (CLARITIN) 10  MG tablet Take 10 mg by mouth daily.    ? nystatin cream (MYCOSTATIN) Apply 1 application. topically 2 (two) times daily. 30 g 0  ? omeprazole (PRILOSEC) 20 MG capsule Take 1 capsule (20 mg total) by mouth 2 (two) times daily before a meal. 180 capsule 2  ? oxyCODONE-acetaminophen (PERCOCET/ROXICET) 5-325 MG tablet Take 0.5 tablets by mouth every 8 (eight) hours as needed for severe pain. 45 tablet 0  ? Probiotic Product (PROBIOTIC ADVANCED PO) Take 1 capsule by mouth daily.    ? Respiratory  Therapy Supplies (FLUTTER) DEVI Use as directed 1 each 0  ? triamcinolone (KENALOG) 0.1 % paste Apply 2 times daily on oral lesion for 14 days. 5 g 0  ? ?No current facility-administered medications for this visit.  ? ?Facility-Administered Medications Ordered in Other Visits  ?Medication Dose Route Frequency Provider Last Rate Last Admin  ? sodium chloride flush (NS) 0.9 % injection 10 mL  10 mL Intracatheter PRN Curt Bears, MD   10 mL at 06/22/17 1747  ? ? ?SURGICAL HISTORY:  ?Past Surgical History:  ?Procedure Laterality Date  ? BREAST BIOPSY    ? several. Denies Hx of breast cancer.  ? IR FLUORO GUIDE PORT INSERTION RIGHT  02/04/2017  ? IR US GUIDE VASC ACCESS RIGHT  02/04/2017  ? THORACOTOMY/LOBECTOMY Right 2008  ? TONSILLECTOMY  1960  ? ? ?REVIEW OF SYSTEMS:  A comprehensive review of systems was negative.  ? ?PHYSICAL EXAMINATION: General appearance: alert, cooperative, and no distress ?Head: Normocephalic, without obvious abnormality, atraumatic ?Neck: no adenopathy, no JVD, supple, symmetrical, trachea midline, and thyroid not enlarged, symmetric, no tenderness/mass/nodules ?Lymph nodes: Cervical, supraclavicular, and axillary nodes normal. ?Resp: clear to auscultation bilaterally ?Back: symmetric, no curvature. ROM normal. No CVA tenderness. ?Cardio: regular rate and rhythm, S1, S2 normal, no murmur, click, rub or gallop ?GI: soft, non-tender; bowel sounds normal; no masses,  no organomegaly ?Extremities: extremities normal, atraumatic, no cyanosis or edema ? ?ECOG PERFORMANCE STATUS: 1 - Symptomatic but completely ambulatory ? ?Blood pressure (!) 154/67, pulse 65, temperature (!) 97 ?F (36.1 ?C), temperature source Tympanic, resp. rate 16, weight 157 lb 1 oz (71.2 kg), SpO2 91 %. ? ?LABORATORY DATA: ?Lab Results  ?Component Value Date  ? WBC 7.3 10/23/2021  ? HGB 10.4 (L) 10/23/2021  ? HCT 33.6 (L) 10/23/2021  ? MCV 82.8 10/23/2021  ? PLT 406 (H) 10/23/2021  ? ? ?  Chemistry   ?   ?Component Value  Date/Time  ? NA 140 10/23/2021 1403  ? NA 142 06/22/2017 1335  ? K 4.0 10/23/2021 1403  ? K 3.9 06/22/2017 1335  ? CL 104 10/23/2021 1403  ? CO2 30 10/23/2021 1403  ? CO2 26 06/22/2017 1335  ? BUN 20 10/23/2021 1403  ? BUN 13.6 06/22/2017 1335  ? CREATININE 0.73 10/23/2021 1403  ? CREATININE 0.7 06/22/2017 1335  ?    ?Component Value Date/Time  ? CALCIUM 9.0 10/23/2021 1403  ? CALCIUM 9.4 06/22/2017 1335  ? ALKPHOS 92 10/23/2021 1403  ? ALKPHOS 93 06/22/2017 1335  ? AST 14 (L) 10/23/2021 1403  ? AST 12 06/22/2017 1335  ? ALT 12 10/23/2021 1403  ? ALT 15 06/22/2017 1335  ? BILITOT 0.3 10/23/2021 1403  ? BILITOT 0.25 06/22/2017 1335  ?  ? ? ? ?RADIOGRAPHIC STUDIES: ?CT Chest W Contrast ? ?Result Date: 10/26/2021 ?CLINICAL DATA:  Non-small cell lung cancer, follow-up. * Tracking Code: BO * Primary Cancer Type: Lung Imaging Indication: Routine surveillance Interval  therapy since last imaging? No Initial Cancer Diagnosis Date: 09/2016; Established by: Biopsy-proven Detailed Pathology: Stage IIB non-small cell lung cancer, adenocarcinoma. Primary Tumor location: Left upper lobe. Surgeries: Right lobectomy 2008. Chemotherapy: Yes; Ongoing? No; Most recent administration: 2018 Immunotherapy? Yes; Type: Imfinzi; Ongoing? No Radiation therapy? Yes; Date Range: 01/24/2017 - 03/08/2017; Target: left lung EXAM: CT CHEST WITH CONTRAST TECHNIQUE: Multidetector CT imaging of the chest was performed during intravenous contrast administration. RADIATION DOSE REDUCTION: This exam was performed according to the departmental dose-optimization program which includes automated exposure control, adjustment of the mA and/or kV according to patient size and/or use of iterative reconstruction technique. CONTRAST:  67mL OMNIPAQUE IOHEXOL 300 MG/ML  SOLN COMPARISON:  Multiple priors including most recent CT April 27, 2021 FINDINGS: Cardiovascular: Accessed right chest Port-A-Cath with tip in the right atrium. Aortic atherosclerosis without  aneurysmal dilation. Main pulmonary artery measures 3.2 cm. Normal size heart. No significant pericardial effusion/thickening. Mediastinum/Nodes: Unchanged rightward deviation of the mediastinum no discrete thyroid nodul

## 2021-11-02 ENCOUNTER — Ambulatory Visit: Payer: Medicare Other

## 2021-11-18 ENCOUNTER — Other Ambulatory Visit: Payer: Self-pay | Admitting: Family Medicine

## 2021-11-18 DIAGNOSIS — C3492 Malignant neoplasm of unspecified part of left bronchus or lung: Secondary | ICD-10-CM

## 2021-11-18 DIAGNOSIS — G894 Chronic pain syndrome: Secondary | ICD-10-CM

## 2021-11-18 NOTE — Telephone Encounter (Signed)
-   last visit 09/21/21 - last refilled 10/20/21 - follow up due in 02/2022

## 2021-11-18 NOTE — Telephone Encounter (Signed)
Pt is calling and would like a refill on oxyCODONE-acetaminophen (PERCOCET/ROXICET) 5-325 MG tablet  [facility] [address] [place] [address] Phone:  [phone]  Fax:  [phone]

## 2021-11-20 ENCOUNTER — Other Ambulatory Visit: Payer: Self-pay | Admitting: Family Medicine

## 2021-11-20 DIAGNOSIS — J310 Chronic rhinitis: Secondary | ICD-10-CM

## 2021-11-20 MED ORDER — OXYCODONE-ACETAMINOPHEN 5-325 MG PO TABS: 0.5000 | ORAL_TABLET | Freq: Three times a day (TID) | ORAL | 0 refills | Status: DC | PRN

## 2021-11-30 ENCOUNTER — Ambulatory Visit: Payer: Medicare Other

## 2021-12-13 ENCOUNTER — Other Ambulatory Visit: Payer: Self-pay | Admitting: Internal Medicine

## 2021-12-16 ENCOUNTER — Other Ambulatory Visit: Payer: Self-pay | Admitting: Family Medicine

## 2021-12-16 DIAGNOSIS — C3492 Malignant neoplasm of unspecified part of left bronchus or lung: Secondary | ICD-10-CM

## 2021-12-16 DIAGNOSIS — G894 Chronic pain syndrome: Secondary | ICD-10-CM

## 2021-12-16 MED ORDER — OXYCODONE-ACETAMINOPHEN 5-325 MG PO TABS: 0.5000 | ORAL_TABLET | Freq: Three times a day (TID) | ORAL | 0 refills | Status: DC | PRN

## 2021-12-16 NOTE — Telephone Encounter (Signed)
Pt is calling and would like a refill on oxyCODONE-acetaminophen (PERCOCET/ROXICET) 5-325 MG tablet and  [place], [place] [address] Phone:  [phone]  Fax:  [phone]

## 2021-12-28 ENCOUNTER — Ambulatory Visit: Payer: Medicare Other

## 2022-01-10 ENCOUNTER — Other Ambulatory Visit: Payer: Self-pay | Admitting: Internal Medicine

## 2022-01-11 ENCOUNTER — Telehealth: Payer: Self-pay | Admitting: Pharmacist

## 2022-01-11 NOTE — Chronic Care Management (AMB) (Unsigned)
Chronic Care Management Pharmacy Assistant   Name: [facility]  MRN: [id number] DOB: [birth year]  Reason for Encounter: Disease State / General Assessment Call   Conditions to be addressed/monitored: HTN, HLD, GERD, Hypothyroidism, and Osteoporosis  Recent office visits:  09/21/2021 Betty [name] MD - Patient was seen for Primary hypothyroidism and additional issues. No medication changes. Follow up in 5 months.  Recent consult visits:  10/28/2021 Curt Bears MD (oncology) - Patient was seen for Malignant neoplasm of unspecified part of unspecified bronchus or lung and additional issues. No medication changes. Follow up in 6 months.  Hospital visits:  None  Medications: Outpatient Encounter Medications as of 01/11/2022  Medication Sig   albuterol (VENTOLIN HFA) 108 (90 Base) MCG/ACT inhaler Inhale 2 puffs into the lungs every 6 (six) hours as needed for wheezing or shortness of breath.   amLODipine (NORVASC) 5 MG tablet Take 1 tablet (5 mg total) by mouth daily.   aspirin EC 81 MG tablet Take 81 mg by mouth daily.   atorvastatin (LIPITOR) 40 MG tablet Take 1 tablet by mouth once daily   atorvastatin (LIPITOR) 40 MG tablet Take 1 tablet by mouth daily.   Azelastine HCl 137 MCG/SPRAY SOLN USE 2 SPRAY(S) IN EACH NOSTRIL TWICE DAILY AS DIRECTED   betamethasone dipropionate (DIPROLENE) 0.05 % cream Apply topically daily as needed.   BREO ELLIPTA 200-25 MCG/ACT AEPB Inhale 1 puff by mouth once daily   clotrimazole-betamethasone (LOTRISONE) cream APPLY ONE APPLICATION TOPICALLY DAILY.   FIBER ADULT GUMMIES PO Take by mouth.   fluticasone (FLONASE) 50 MCG/ACT nasal spray Place 1 spray into both nostrils 2 (two) times daily as needed.   guaiFENesin (MUCINEX) 600 MG 12 hr tablet Take 1 tablet (600 mg total) by mouth 2 (two) times daily. (Patient taking differently: Take 600 mg by mouth daily.)   levothyroxine (SYNTHROID) 75 MCG tablet TAKE 1 TABLET BY MOUTH  DAILY BEFORE  BREAKFAST   loratadine (CLARITIN) 10 MG tablet Take 10 mg by mouth daily.   nystatin cream (MYCOSTATIN) Apply 1 application. topically 2 (two) times daily.   omeprazole (PRILOSEC) 20 MG capsule Take 1 capsule (20 mg total) by mouth 2 (two) times daily before a meal.   oxyCODONE-acetaminophen (PERCOCET/ROXICET) 5-325 MG tablet Take 0.5 tablets by mouth every 8 (eight) hours as needed for severe pain.   Probiotic Product (PROBIOTIC ADVANCED PO) Take 1 capsule by mouth daily.   Respiratory Therapy Supplies (FLUTTER) DEVI Use as directed   triamcinolone (KENALOG) 0.1 % paste Apply 2 times daily on oral lesion for 14 days.   Facility-Administered Encounter Medications as of 01/11/2022  Medication   sodium chloride flush (NS) 0.9 % injection 10 mL  Fill History:  AMLODIPINE BESYLATE  5 MG TABS 03/20/2021 90   ATORVASTATIN 40MG    TAB 10/16/2021 90   AZELASTINE 0.1% SPR 11/20/2021 25   CLOTRIM/BETA 1-0.05% CRE 10/16/2021 30   BREO ELLIPTA 200-25  INH 12/18/2021 30   levothyroxine (SYNTHROID) 75 MCG tablet 10/26/2021 90   OMEPRAZOLE 20MG  CAP 11/18/2021 90   [place] for General Review Call   Chart Review:  Have there been any documented new, changed, or discontinued medications since last visit? No recent medication changes  Has there been any documented recent hospitalizations or ED visits since last visit with Clinical Pharmacist? No recent hospital visits   Disease State Questions:  Able to connect with Patient? {yes/no:20286}  Did patient have any problems with their health recently? {yes/no:20286}  Have you had any visits with new specialists or providers since your last visit? {yes/no:20286}  Have you had any new health care problem(s) since your last visit? {yes/no:20286}  Have you run out of any of your medications since you last spoke with clinical pharmacist? {yes/no:20286}  Are there any medications you are not taking as prescribed?  {yes/no:20286}  Are you having any issues or side effects with your medications? {yes/no:20286}   Care Gaps: AWV - scheduled 02/01/2022 Last BP - 154/67 on 10/28/2021  Star Rating Drugs: Atorvastatin 40 mg - last filled 10/16/2021 90 DS at [facility] [phone]

## 2022-01-12 ENCOUNTER — Telehealth (INDEPENDENT_AMBULATORY_CARE_PROVIDER_SITE_OTHER): Payer: Medicare Other | Admitting: Family Medicine

## 2022-01-12 ENCOUNTER — Other Ambulatory Visit (HOSPITAL_COMMUNITY): Payer: Self-pay

## 2022-01-12 ENCOUNTER — Encounter: Payer: Self-pay | Admitting: Family Medicine

## 2022-01-12 VITALS — Ht 62.0 in

## 2022-01-12 DIAGNOSIS — R159 Full incontinence of feces: Secondary | ICD-10-CM | POA: Diagnosis not present

## 2022-01-12 DIAGNOSIS — E039 Hypothyroidism, unspecified: Secondary | ICD-10-CM | POA: Diagnosis not present

## 2022-01-12 DIAGNOSIS — R0609 Other forms of dyspnea: Secondary | ICD-10-CM | POA: Diagnosis not present

## 2022-01-12 DIAGNOSIS — R300 Dysuria: Secondary | ICD-10-CM

## 2022-01-12 NOTE — Progress Notes (Signed)
Virtual Visit via Video Note I connected with Felicia Herrera on 01/12/22 by a video enabled telemedicine application and verified that I am speaking with the correct person using two identifiers.  Location patient: home Location provider:work office Persons participating in the virtual visit: patient, provider  I discussed the limitations of evaluation and management by telemedicine and the availability of in person appointments. The patient expressed understanding and agreed to proceed.  Chief Complaint  Patient presents with   bowels    Had sudden bowel movement in public yesterday; no stomach cramping or nausea.    HPI: Felicia Herrera is a 66 year old female with history of lung adenocarcinoma, COPD, chronic pain,GERD, hypertension, and hypothyroidism complaining of episode of fecal incontinence yesterday while she was waiting in line. It was sudden, no warning symptoms, she felt when she was passing stool but was not able to hold it. No prior episodes.  Today she has had a few small loose stools, "dribble." Hypothyroidism on levothyroxine 75 mcg daily.  Lab Results  Component Value Date   TSH 2.57 09/21/2021   No associated back pain, saddle anesthesia, bladder dysfunction, or lower extremity weakness/numbness/tingling. Mild dysuria for a couple days , stable, no changes in urinary frequency. Negative for decreased urine output, foamy urine, gross hematuria, vaginal pruritus, or vaginal discharge/bleeding.  No sick contact, dietary changes, or recent travel.  Negative for fever, chills, change in appetite, sore throat, CP, palpitations, abdominal pain, nausea, vomiting, or MS changes. Worsening shortness of breath and fatigue, no associated wheezing and no changes in chronic cough. She follows with pulmonologist, currently she is on Breo 200-25 1 puff daily and albuterol inhaler 4 times daily as needed for wheezing.  Flutter valve was recommended by her pulmonologist but has  not used it for 3 months, she was using it 2 x day.  Negative for hemoptysis,orthopnea,PND,or edema.  ROS: See pertinent positives and negatives per HPI.  Past Medical History:  Diagnosis Date   Adenocarcinoma of left lung, stage 2 ([place]) 12/27/2016   Cancer ([place])    Lun cancer: Right Dx 2008, s/p lobectomy. Left Dx 09/2016   COPD (chronic obstructive pulmonary disease) ([place])    Encounter for antineoplastic chemotherapy 12/27/2016   History of radiation therapy 01/24/17-03/08/17   left lung 2 Gy in 30 fractions   Hyperlipidemia    Hypertension    Stroke [facility])    Past Surgical History:  Procedure Laterality Date   BREAST BIOPSY     several. Denies Hx of breast cancer.   IR FLUORO GUIDE PORT INSERTION RIGHT  02/04/2017   IR US GUIDE VASC ACCESS RIGHT  02/04/2017   THORACOTOMY/LOBECTOMY Right 2008   TONSILLECTOMY  1960   Family History  Problem Relation Age of Onset   Arthritis Mother    Lung cancer Mother 28       d.45 from cancer   Hypertension Father    Arthritis Father    Pancreatic cancer Sister 64       d.62 from cancer   Brain cancer Brother        d.~70   Colon cancer Brother        d.~70   Hypertension Brother    Mental retardation Brother    Melanoma Brother 35   Lung cancer Maternal Aunt 66   Colon cancer Maternal Grandfather    Depression Neg Hx    Heart disease Neg Hx     Social History   Socioeconomic History   Marital status: Single  Spouse name: Not on file   Number of children: Not on file   Years of education: Not on file   Highest education level: Not on file  Occupational History   Occupation: landscaping  Tobacco Use   Smoking status: Former    Packs/day: 1.00    Years: 30.00    Total pack years: 30.00    Types: Cigarettes    Quit date: 06/21/2005    Years since quitting: 16.5   Smokeless tobacco: Never  Vaping Use   Vaping Use: Never used  Substance and Sexual Activity   Alcohol use: No   Drug use: No   Sexual activity: Not Currently   Other Topics Concern   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    Social Determinants of Health   Financial Resource Strain: Low Risk  (09/23/2020)   Overall Financial Resource Strain (CARDIA)    Difficulty of Paying Living Expenses: Not hard at all  Food Insecurity: No Food Insecurity (09/23/2020)   Hunger Vital Sign    Worried About Running Out of Food in the Last Year: Never true    Ran Out of Food in the Last Year: Never true  Transportation Needs: No Transportation Needs (09/23/2020)   PRAPARE - Hydrologist (Medical): No    Lack of Transportation (Non-Medical): No  Physical Activity: Insufficiently Active (09/23/2020)   Exercise Vital Sign    Days of Exercise per Week: 6 days    Minutes of Exercise per Session: 20 min  Stress: No Stress Concern Present (09/23/2020)   [place]    Feeling of Stress : Not at all  Social Connections: Socially Isolated (09/23/2020)   Social Connection and Isolation Panel [NHANES]    Frequency of Communication with Friends and Family: More than three times a week    Frequency of Social Gatherings with Friends and Family: Three times a week    Attends Religious Services: Never    Active Member of Clubs or Organizations: No    Attends Archivist Meetings: Never    Marital Status: Never married  Intimate Partner Violence: Not At Risk (09/23/2020)   Humiliation, Afraid, Rape, and Kick questionnaire    Fear of Current or Ex-Partner: No    Emotionally Abused: No    Physically Abused: No    Sexually Abused: No    Current Outpatient Medications:    albuterol (VENTOLIN HFA) 108 (90 Base) MCG/ACT inhaler, Inhale 2 puffs into the lungs every 6 (six) hours as needed for wheezing or shortness of breath., Disp: 8 g, Rfl: 6   amLODipine (NORVASC) 5 MG tablet, Take 1 tablet (5 mg total) by mouth daily., Disp: 90 tablet, Rfl: 3    aspirin EC 81 MG tablet, Take 81 mg by mouth daily., Disp: , Rfl:    atorvastatin (LIPITOR) 40 MG tablet, Take 1 tablet by mouth daily., Disp: 90 tablet, Rfl: 3   Azelastine HCl 137 MCG/SPRAY SOLN, USE 2 SPRAY(S) IN EACH NOSTRIL TWICE DAILY AS DIRECTED, Disp: 30 mL, Rfl: 0   betamethasone dipropionate (DIPROLENE) 0.05 % cream, Apply topically daily as needed., Disp: 30 g, Rfl: 2   BREO ELLIPTA 200-25 MCG/ACT AEPB, Inhale 1 puff by mouth once daily, Disp: 60 each, Rfl: 0   clotrimazole-betamethasone (LOTRISONE) cream, APPLY ONE APPLICATION TOPICALLY DAILY., Disp: 30 g, Rfl: 0   FIBER ADULT GUMMIES PO, Take by mouth., Disp: ,  Rfl:    fluticasone (FLONASE) 50 MCG/ACT nasal spray, Place 1 spray into both nostrils 2 (two) times daily as needed., Disp: 16 g, Rfl: 3   guaiFENesin (MUCINEX) 600 MG 12 hr tablet, Take 1 tablet (600 mg total) by mouth 2 (two) times daily. (Patient taking differently: Take 600 mg by mouth daily.), Disp: 60 tablet, Rfl: 2   levothyroxine (SYNTHROID) 75 MCG tablet, TAKE 1 TABLET BY MOUTH  DAILY BEFORE BREAKFAST, Disp: 90 tablet, Rfl: 3   loratadine (CLARITIN) 10 MG tablet, Take 10 mg by mouth daily., Disp: , Rfl:    nystatin cream (MYCOSTATIN), Apply 1 application. topically 2 (two) times daily., Disp: 30 g, Rfl: 0   omeprazole (PRILOSEC) 20 MG capsule, Take 1 capsule (20 mg total) by mouth 2 (two) times daily before a meal., Disp: 180 capsule, Rfl: 2   oxyCODONE-acetaminophen (PERCOCET/ROXICET) 5-325 MG tablet, Take 0.5 tablets by mouth every 8 (eight) hours as needed for severe pain., Disp: 45 tablet, Rfl: 0   Probiotic Product (PROBIOTIC ADVANCED PO), Take 1 capsule by mouth daily., Disp: , Rfl:    Respiratory Therapy Supplies (FLUTTER) DEVI, Use as directed, Disp: 1 each, Rfl: 0   triamcinolone (KENALOG) 0.1 % paste, Apply 2 times daily on oral lesion for 14 days., Disp: 5 g, Rfl: 0 No current facility-administered medications for this visit.  Facility-Administered  Medications Ordered in Other Visits:    sodium chloride flush (NS) 0.9 % injection 10 mL, 10 mL, Intracatheter, PRN, Curt Bears, MD, 10 mL at 06/22/17 1747  EXAM:  VITALS per patient if applicable:Ht 5\' 2"  (1.575 m)   BMI 28.73 kg/m   GENERAL: alert, oriented, appears well and in no acute distress  HEENT: atraumatic, conjunctiva clear, no obvious abnormalities on inspection.  NECK: normal movements of the head and neck  LUNGS: on inspection no signs of respiratory distress, breathing rate appears normal during beginning of visit then mildly hyperventilating, she seems SOB but no gasping or wheezing. Coughing a few times during visit.  CV: no obvious cyanosis  MS: moves all visible extremities without noticeable abnormality  PSYCH/NEURO: pleasant and cooperative, no obvious depression or anxiety, speech and thought processing grossly intact  ASSESSMENT AND PLAN:  Discussed the following assessment and plan: Orders Placed This Encounter  Procedures   DG Chest 2 View   CBC with Differential/Platelet   Basic metabolic panel   Urinalysis with Culture Reflex   TSH   DOE (dyspnea on exertion) - Plan: CBC with Differential/Platelet, Basic metabolic panel, DG Chest 2 View She has had some DOE in the past, she is reporting like worse in the past coupe days. Declined going to the ED. We discussed possible etiologies. ? COPD Instructed to resume use of flutter valve device. Instructed about warning signs. CXR ordered.  Incontinence of feces, unspecified fecal incontinence type - Plan: CBC with Differential/Platelet, Basic metabolic panel Not sure about what could have caused episode of incontinence, has not been recurrent but seems to be having some diarrhea with no other GI symptom. Instructed to monitor for new symptoms. Adequate hydration and fiber intake.  Dysuria - Plan: Urinalysis with Culture Reflex Mild and with no other urinary symptom. We could start empiric abx  treatment but it could aggravate diarrhea. She agrees with having UA done and make recommendations accordingly. Instructed about warning signs.  Primary hypothyroidism Problem has been well controlled. Continue levothyroxine 75 mcg daily. We will recheck TSH to be sure this problem is not contributing to some  of her symptoms.  I called her about 30 min after the visit to clarified the dysuria and she does not sound SOB. She is going to the lab tomorrow am.  We discussed possible serious and likely etiologies, options for evaluation and workup, limitations of telemedicine visit vs in person visit, treatment, treatment risks and precautions. The patient was advised to call back or seek an in-person evaluation if the symptoms worsen or if the condition fails to improve as anticipated. I discussed the assessment and treatment plan with the patient. The patient was provided an opportunity to ask questions and all were answered. The patient agreed with the plan and demonstrated an understanding of the instructions.  Return if symptoms worsen or fail to improve. Bethania Schlotzhauer G. [name], MD  [facility]. [place] office.

## 2022-01-13 ENCOUNTER — Other Ambulatory Visit (INDEPENDENT_AMBULATORY_CARE_PROVIDER_SITE_OTHER): Payer: Medicare Other

## 2022-01-13 ENCOUNTER — Telehealth: Payer: Self-pay | Admitting: Family Medicine

## 2022-01-13 ENCOUNTER — Telehealth: Payer: Self-pay | Admitting: Internal Medicine

## 2022-01-13 ENCOUNTER — Ambulatory Visit (INDEPENDENT_AMBULATORY_CARE_PROVIDER_SITE_OTHER)
Admission: RE | Admit: 2022-01-13 | Discharge: 2022-01-13 | Disposition: A | Discharge status: Home / Self Care | Payer: Medicare Other | Source: Ambulatory Visit | Attending: Family Medicine | Admitting: Family Medicine

## 2022-01-13 DIAGNOSIS — E039 Hypothyroidism, unspecified: Secondary | ICD-10-CM | POA: Diagnosis not present

## 2022-01-13 DIAGNOSIS — R0602 Shortness of breath: Secondary | ICD-10-CM | POA: Diagnosis not present

## 2022-01-13 DIAGNOSIS — R300 Dysuria: Secondary | ICD-10-CM

## 2022-01-13 DIAGNOSIS — R0609 Other forms of dyspnea: Secondary | ICD-10-CM

## 2022-01-13 DIAGNOSIS — R159 Full incontinence of feces: Secondary | ICD-10-CM

## 2022-01-13 LAB — CBC WITH DIFFERENTIAL/PLATELET
Basophils Absolute: 0.1 10*3/uL (ref 0.0–0.1)
Basophils Relative: 0.8 % (ref 0.0–3.0)
Eosinophils Absolute: 0.2 10*3/uL (ref 0.0–0.7)
Eosinophils Relative: 2.1 % (ref 0.0–5.0)
HCT: 36.4 % (ref 36.0–46.0)
Hemoglobin: 11.5 g/dL — ABNORMAL LOW (ref 12.0–15.0)
Lymphocytes Relative: 9.4 % — ABNORMAL LOW (ref 12.0–46.0)
Lymphs Abs: 0.8 10*3/uL (ref 0.7–4.0)
MCHC: 31.7 g/dL (ref 30.0–36.0)
MCV: 76.8 fl — ABNORMAL LOW (ref 78.0–100.0)
Monocytes Absolute: 0.5 10*3/uL (ref 0.1–1.0)
Monocytes Relative: 5.9 % (ref 3.0–12.0)
Neutro Abs: 6.9 10*3/uL (ref 1.4–7.7)
Neutrophils Relative %: 81.8 % — ABNORMAL HIGH (ref 43.0–77.0)
Platelets: 568 10*3/uL — ABNORMAL HIGH (ref 150.0–400.0)
RBC: 4.74 Mil/uL (ref 3.87–5.11)
RDW: 15.8 % — ABNORMAL HIGH (ref 11.5–15.5)
WBC: 8.4 10*3/uL (ref 4.0–10.5)

## 2022-01-13 LAB — BASIC METABOLIC PANEL
BUN: 14 mg/dL (ref 6–23)
CO2: 26 mEq/L (ref 19–32)
Calcium: 9 mg/dL (ref 8.4–10.5)
Chloride: 100 mEq/L (ref 96–112)
Creatinine, Ser: 0.83 mg/dL (ref 0.40–1.20)
GFR: 74.36 mL/min (ref >60.00)
Glucose, Bld: 138 mg/dL — ABNORMAL HIGH (ref 70–99)
Potassium: 3.4 mEq/L — ABNORMAL LOW (ref 3.5–5.1)
Sodium: 138 mEq/L (ref 135–145)

## 2022-01-13 LAB — TSH: TSH: 4.96 u[IU]/mL (ref 0.35–5.50)

## 2022-01-13 NOTE — Telephone Encounter (Signed)
I called the patient and she is going to come in for a flutter valve and has an OV to follow up with Judson Roch in August. She will come by and get the flutter valve signed. It was placed up front in the box with where to sign. Nothing further needed.

## 2022-01-13 NOTE — Telephone Encounter (Signed)
PT is calling and can not get into her mychart and would like blood work results

## 2022-01-14 ENCOUNTER — Other Ambulatory Visit (HOSPITAL_COMMUNITY): Payer: Self-pay

## 2022-01-14 ENCOUNTER — Other Ambulatory Visit: Payer: Self-pay | Admitting: Family Medicine

## 2022-01-14 DIAGNOSIS — C3492 Malignant neoplasm of unspecified part of left bronchus or lung: Secondary | ICD-10-CM

## 2022-01-14 DIAGNOSIS — G894 Chronic pain syndrome: Secondary | ICD-10-CM

## 2022-01-14 NOTE — Telephone Encounter (Signed)
Pt call and want to change her Oxycodone from [facility] to  [place] Phone:  [phone]  Fax:  [phone]

## 2022-01-15 ENCOUNTER — Other Ambulatory Visit (HOSPITAL_COMMUNITY): Payer: Self-pay

## 2022-01-15 ENCOUNTER — Other Ambulatory Visit: Payer: Self-pay

## 2022-01-15 LAB — URINALYSIS W MICROSCOPIC + REFLEX CULTURE

## 2022-01-15 MED ORDER — ATORVASTATIN CALCIUM 40 MG PO TABS
ORAL_TABLET | ORAL | 2 refills | Status: DC
  Filled 2022-01-15: qty 90, 90d supply, fill #0

## 2022-01-15 MED ORDER — AMOXICILLIN-POT CLAVULANATE 875-125 MG PO TABS
1.0000 | ORAL_TABLET | Freq: Two times a day (BID) | ORAL | 0 refills | Status: AC
  Filled 2022-01-15: qty 14, 7d supply, fill #0

## 2022-01-15 MED FILL — Fluticasone Furoate-Vilanterol Aero Powd BA 200-25 MCG/ACT: RESPIRATORY_TRACT | 30 days supply | Qty: 60 | Fill #0 | Status: AC

## 2022-01-15 NOTE — Telephone Encounter (Signed)
See result note.  

## 2022-01-19 ENCOUNTER — Other Ambulatory Visit (HOSPITAL_COMMUNITY): Payer: Self-pay

## 2022-01-19 MED ORDER — OXYCODONE-ACETAMINOPHEN 5-325 MG PO TABS
0.5000 | ORAL_TABLET | Freq: Three times a day (TID) | ORAL | 0 refills | Status: DC | PRN
  Filled 2022-01-19: qty 45, 30d supply, fill #0

## 2022-01-20 ENCOUNTER — Other Ambulatory Visit (HOSPITAL_COMMUNITY): Payer: Self-pay

## 2022-01-25 ENCOUNTER — Telehealth: Payer: Self-pay

## 2022-01-25 NOTE — Telephone Encounter (Signed)
--  Caller states she needs to speak to a nurse. Caller states she is having diarrhea x3-4 days due to Augmentin. She has had it since she started taking the Augmentin. Denies any other complaints at this time. Watery stool x6-8/24 hours.  01/22/2022 12:53:56 PM Go to ED Now (or PCP triage) Yes Patsey Berthold, RN, [place]  Comments User: Lerry Liner, RN Date/Time Eilene Ghazi Time): 01/22/2022 12:47:40 PM reports drinking 8 bottles/16.9 fl oz=572mL  User: Lerry Liner, RN Date/Time Eilene Ghazi Time): 01/22/2022 12:52:56 PM patient refused to go to E.D, reports she will stay home at this time.  Referrals GO TO FACILITY REFUSED

## 2022-01-27 ENCOUNTER — Telehealth: Payer: Self-pay | Admitting: Family Medicine

## 2022-01-27 ENCOUNTER — Other Ambulatory Visit (HOSPITAL_COMMUNITY): Payer: Self-pay

## 2022-01-27 DIAGNOSIS — J310 Chronic rhinitis: Secondary | ICD-10-CM

## 2022-01-27 MED ORDER — AZELASTINE HCL 137 MCG/SPRAY NA SOLN
NASAL | 2 refills | Status: AC
  Filled 2022-01-27: qty 30, 25d supply, fill #0
  Filled 2022-02-17: qty 30, 25d supply, fill #1

## 2022-01-27 NOTE — Telephone Encounter (Signed)
Pt requesting refill of Azelastine HCl 137 MCG/SPRAY [facility] Phone:  [phone]  Fax:  [phone]

## 2022-01-27 NOTE — Telephone Encounter (Signed)
Rx sent in

## 2022-02-01 ENCOUNTER — Ambulatory Visit (INDEPENDENT_AMBULATORY_CARE_PROVIDER_SITE_OTHER): Payer: Medicare Other

## 2022-02-01 VITALS — BP 132/82 | HR 66 | Temp 98.4°F | Ht 62.0 in | Wt 153.5 lb

## 2022-02-01 DIAGNOSIS — Z0000 Encounter for general adult medical examination without abnormal findings: Secondary | ICD-10-CM | POA: Diagnosis not present

## 2022-02-01 NOTE — Progress Notes (Addendum)
Subjective:   Felicia Herrera is a 66 y.o. female who presents for Medicare Annual (Subsequent) preventive examination.  Review of Systems        Objective:    Today's Vitals   02/01/22 1039  BP: 132/82  Pulse: 66  Temp: 98.4 F (36.9 C)  TempSrc: Oral  SpO2: 95%  Weight: 153 lb 8 oz (69.6 kg)  Height: 5\' 2"  (1.575 m)   Body mass index is 28.08 kg/m.     02/01/2022   10:52 AM 04/29/2021   11:24 AM 09/23/2020    1:18 PM 04/28/2020    9:39 AM 10/26/2017   10:20 AM 09/28/2017    9:15 AM 08/31/2017    9:42 AM  Advanced Directives  Does Patient Have a Medical Advance Directive? Yes Yes Yes Yes Yes Yes Yes  Type of Paramedic of [place];Living will [place];Living will Healthcare Power of [place];Living will [place];Living will [place];Living will  Does patient want to make changes to medical advance directive? No - Patient declined No - Patient declined       Copy of [place] in Chart? Yes - validated most recent copy scanned in chart (See row information) Yes - validated most recent copy scanned in chart (See row information) Yes - validated most recent copy scanned in chart (See row information) Yes - validated most recent copy scanned in chart (See row information) Yes Yes Yes    Current Medications (verified) Outpatient Encounter Medications as of 02/01/2022  Medication Sig   albuterol (VENTOLIN HFA) 108 (90 Base) MCG/ACT inhaler Inhale 2 puffs into the lungs every 6 (six) hours as needed for wheezing or shortness of breath.   amLODipine (NORVASC) 5 MG tablet Take 1 tablet (5 mg total) by mouth daily.   aspirin EC 81 MG tablet Take 81 mg by mouth daily.   atorvastatin (LIPITOR) 40 MG tablet Take 1 tablet by mouth daily. (Patient not taking: Reported on 02/01/2022)   atorvastatin (LIPITOR) 40 MG tablet Take 1  tablet by mouth once daily.   Azelastine HCl 137 MCG/SPRAY SOLN USE 2 SPRAY(S) IN EACH NOSTRIL TWICE DAILY AS DIRECTED   betamethasone dipropionate (DIPROLENE) 0.05 % cream Apply topically daily as needed.   BREO ELLIPTA 200-25 MCG/ACT AEPB Inhale 1 puff by mouth once daily   clotrimazole-betamethasone (LOTRISONE) cream APPLY ONE APPLICATION TOPICALLY DAILY.   FIBER ADULT GUMMIES PO Take by mouth.   fluticasone (FLONASE) 50 MCG/ACT nasal spray Place 1 spray into both nostrils 2 (two) times daily as needed.   guaiFENesin (MUCINEX) 600 MG 12 hr tablet Take 1 tablet (600 mg total) by mouth 2 (two) times daily. (Patient taking differently: Take 600 mg by mouth daily.)   levothyroxine (SYNTHROID) 75 MCG tablet TAKE 1 TABLET BY MOUTH  DAILY BEFORE BREAKFAST   loratadine (CLARITIN) 10 MG tablet Take 10 mg by mouth daily.   nystatin cream (MYCOSTATIN) Apply 1 application. topically 2 (two) times daily.   omeprazole (PRILOSEC) 20 MG capsule Take 1 capsule (20 mg total) by mouth 2 (two) times daily before a meal.   oxyCODONE-acetaminophen (PERCOCET/ROXICET) 5-325 MG tablet Take 1/2 tablet by mouth every 8 hours as needed for severe pain.   Probiotic Product (PROBIOTIC ADVANCED PO) Take 1 capsule by mouth daily.   Respiratory Therapy Supplies (FLUTTER) DEVI Use as directed   triamcinolone (KENALOG) 0.1 % paste Apply 2 times  daily on oral lesion for 14 days.   Facility-Administered Encounter Medications as of 02/01/2022  Medication   sodium chloride flush (NS) 0.9 % injection 10 mL    Allergies (verified) Anoro ellipta [umeclidinium-vilanterol]   History: Past Medical History:  Diagnosis Date   Adenocarcinoma of left lung, stage 2 ([place]) 12/27/2016   Cancer ([place])    Lun cancer: Right Dx 2008, s/p lobectomy. Left Dx 09/2016   COPD (chronic obstructive pulmonary disease) ([place])    Encounter for antineoplastic chemotherapy 12/27/2016   History of radiation therapy 01/24/17-03/08/17   left lung 2 Gy in 30  fractions   Hyperlipidemia    Hypertension    Stroke [facility])    Past Surgical History:  Procedure Laterality Date   BREAST BIOPSY     several. Denies Hx of breast cancer.   IR FLUORO GUIDE PORT INSERTION RIGHT  02/04/2017   IR US GUIDE VASC ACCESS RIGHT  02/04/2017   THORACOTOMY/LOBECTOMY Right 2008   TONSILLECTOMY  1960   Family History  Problem Relation Age of Onset   Arthritis Mother    Lung cancer Mother 43       d.45 from cancer   Hypertension Father    Arthritis Father    Pancreatic cancer Sister 32       d.53 from cancer   Brain cancer Brother        d.~70   Colon cancer Brother        d.~70   Hypertension Brother    Mental retardation Brother    Melanoma Brother 35   Lung cancer Maternal Aunt 66   Colon cancer Maternal Grandfather    Depression Neg Hx    Heart disease Neg Hx    Social History   Socioeconomic History   Marital status: Single    Spouse name: Not on file   Number of children: Not on file   Years of education: Not on file   Highest education level: Not on file  Occupational History   Occupation: landscaping  Tobacco Use   Smoking status: Former    Packs/day: 1.00    Years: 30.00    Total pack years: 30.00    Types: Cigarettes    Quit date: 06/21/2005    Years since quitting: 16.6   Smokeless tobacco: Never  Vaping Use   Vaping Use: Never used  Substance and Sexual Activity   Alcohol use: No   Drug use: No   Sexual activity: Not Currently  Other Topics Concern   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    Social Determinants of Health   Financial Resource Strain: Low Risk  (02/01/2022)   Overall Financial Resource Strain (CARDIA)    Difficulty of Paying Living Expenses: Not hard at all  Food Insecurity: No Food Insecurity (02/01/2022)   Hunger Vital Sign    Worried About Running Out of Food in the Last Year: Never true    Ran Out of Food in the Last Year: Never true  Transportation Needs: No  Transportation Needs (02/01/2022)   PRAPARE - Hydrologist (Medical): No    Lack of Transportation (Non-Medical): No  Physical Activity: Inactive (02/01/2022)   Exercise Vital Sign    Days of Exercise per Week: 0 days    Minutes of Exercise per Session: 0 min  Stress: No Stress Concern Present (02/01/2022)   [place]  Feeling of Stress : Not at all  Social Connections: Socially Isolated (02/01/2022)   Social Connection and Isolation Panel [NHANES]    Frequency of Communication with Friends and Family: More than three times a week    Frequency of Social Gatherings with Friends and Family: More than three times a week    Attends Religious Services: Never    Marine scientist or Organizations: No    Attends Archivist Meetings: Never    Marital Status: Never married    Tobacco Counseling Counseling given: Not Answered   Clinical Intake:  Pre-visit preparation completed: No Diabetic?  No  Interpreter Needed?: NoActivities of Daily Living    02/01/2022   10:48 AM  In your present state of health, do you have any difficulty performing the following activities:  Hearing? 0  Vision? 0  Difficulty concentrating or making decisions? 0  Walking or climbing stairs? 0  Dressing or bathing? 0  Doing errands, shopping? 0  Preparing Food and eating ? N  Using the Toilet? N  In the past six months, have you accidently leaked urine? Y  Comment Wears pads. Followed by PCP  Do you have problems with loss of bowel control? Y  Comment Wears pads. Followed by PCP  Managing your Medications? N  Managing your Finances? N  Housekeeping or managing your Housekeeping? N    Patient Care Team: [name], [name], MD as PCP - General (Family Medicine) Viona Gilmore, [facility] as Pharmacist (Pharmacist) Brand Males, MD as Consulting Physician (Pulmonary Disease)  Indicate any recent  Medical Services you may have received from other than [facility] providers in the past year (date may be approximate).     Assessment:   This is a routine wellness examination for [place].  Hearing/Vision screen Hearing Screening - Comments:: No hearing difficulty Vision Screening - Comments:: Wears glasses.  Dietary issues and exercise activities discussed: Exercise limited by: None identified   Goals Addressed               This Visit's Progress     Stay healthy (pt-stated)         Depression Screen    02/01/2022   10:46 AM 09/21/2021    9:45 AM 05/22/2021   12:39 PM 09/23/2020    1:16 PM 02/18/2020   12:08 PM 07/13/2017    3:01 PM 04/04/2017   10:44 AM  PHQ 2/9 Scores  PHQ - 2 Score 0 0 0 0 0 0 0  PHQ- 9 Score   0        Fall Risk    02/01/2022   10:51 AM 05/22/2021   12:39 PM 09/23/2020    1:19 PM 07/13/2017    3:01 PM 04/04/2017   10:44 AM  [place] in the past year? 0 0 0 Yes Yes  Number falls in past yr: 0  0 1 1  Injury with Fall? 0  0 Yes Yes  Risk for fall due to : No Fall Risks  Impaired vision;Impaired balance/gait History of fall(s)   Follow up   Falls prevention discussed      FALL RISK PREVENTION PERTAINING  TO THE HOME:  Any stairs in or around the home? No  If so, are there any without handrails? No  Home free of loose throw rugs in walkways, pet beds, electrical cords, etc? Yes  Adequate lighting in your home to reduce risk of falls? Yes   ASSISTIVE DEVICES UTILIZED TO PREVENT FALLS:  Life alert? Yes Use of a cane, walker or w/c? Yes  Grab bars in the bathroom? Yes  Shower chair or bench in shower? Yes  Elevated toilet seat or a handicapped toilet? Yes   TIMED UP AND GO:  Was the test performed? Yes .  Length of time to ambulate 10 feet: 10 sec.   Gait steady and fast without use of assistive device  Cognitive Function:        02/01/2022   10:52 AM 09/23/2020    1:26 PM  6CIT Screen  What Year? 0 points 0 points  What month?  0 points 0 points  What time? 0 points   Count back from 20 0 points 0 points  Months in reverse 0 points 0 points  Repeat phrase 0 points 0 points  Total Score 0 points     Immunizations Immunization History  Administered Date(s) Administered   Influenza, Quadrivalent, Recombinant, Inj, Pf 03/12/2018   Influenza,inj,Quad PF,6+ Mos 03/21/2016, 02/22/2017, 03/06/2019, 04/03/2020   Influenza-Unspecified 03/27/2021   PFIZER(Purple Top)SARS-COV-2 Vaccination 09/10/2019, 10/02/2019, 03/18/2020   PNEUMOCOCCAL CONJUGATE-20 04/10/2021   Pfizer Covid-19 Vaccine Bivalent Booster 34yrs & up 03/27/2021   Tdap 06/01/2019   Zoster Recombinat (Shingrix) 06/01/2019, 08/13/2019    TDAP status: Up to date  Flu Vaccine status: Up to date    Covid-19 vaccine status: Completed vaccines  Qualifies for Shingles Vaccine? Yes   Zostavax completed Yes   Shingrix Completed?: Yes  Screening Tests Health Maintenance  Topic Date Due   INFLUENZA VACCINE  01/19/2022   COVID-19 Vaccine (5 - Pfizer risk series) 02/17/2022 (Originally 05/22/2021)   HIV Screening  11/19/2025 (Originally 05/05/1971)   MAMMOGRAM  02/10/2023   PAP SMEAR-Modifier  11/20/2023   COLONOSCOPY (Pts 45-16yrs Insurance coverage will need to be confirmed)  04/27/2026   TETANUS/TDAP  05/31/2029   Hepatitis C Screening  Completed   Zoster Vaccines- Shingrix  Completed   HPV VACCINES  Aged Out    Health Maintenance  Health Maintenance Due  Topic Date Due   INFLUENZA VACCINE  01/19/2022    Colorectal cancer screening: Type of screening: Colonoscopy. Completed 04/27/16. Repeat every 10 years  Mammogram status: Completed 02/09/21. Repeat every year    Lung Cancer Screening: (Low Dose CT Chest recommended if Age 73-80 years, 30 pack-year currently smoking OR have quit w/in 15years.) does not qualify.     Additional Screening:  Hepatitis C Screening: does qualify; Completed 08/13/19  Vision Screening: Recommended annual  ophthalmology exams for early detection of glaucoma and other disorders of the eye. Is the patient up to date with their annual eye exam?  Yes  Who is the provider or what is the name of the office in which the patient attends annual eye exams? Patient deferred If pt is not established with a provider, would they like to be referred to a provider to establish care? No .   Dental Screening: Recommended annual dental exams for proper oral hygiene  Community Resource Referral / Chronic Care Management:  CRR required this visit?  No   CCM required this visit?  No      Plan:     I have personally reviewed and noted the following in the patient's chart:   Medical and social history Use of alcohol, tobacco or illicit drugs  Current medications and supplements including opioid prescriptions.  Functional ability and status Nutritional status Physical activity Advanced directives List of other physicians Hospitalizations, surgeries, and ER visits in previous 12 months Vitals Screenings  to include cognitive, depression, and falls Referrals and appointments  In addition, I have reviewed and discussed with patient certain preventive protocols, quality metrics, and best practice recommendations. A written personalized care plan for preventive services as well as general preventive health recommendations were provided to patient.     Criselda Peaches, LPN   10/19/1482   Nurse Notes: None

## 2022-02-01 NOTE — Patient Instructions (Addendum)
Felicia Herrera , Thank you for taking time to come for your Medicare Wellness Visit. I appreciate your ongoing commitment to your health goals. Please review the following plan we discussed and let me know if I can assist you in the future.   These are the goals we discussed:  Goals       Patient Stated      Exercise       Stay healthy (pt-stated)        This is a list of the screening recommended for you and due dates:  Health Maintenance  Topic Date Due   Flu Shot  01/19/2022   COVID-19 Vaccine (5 - Pfizer risk series) 02/17/2022*   HIV Screening  11/19/2025*   Mammogram  02/10/2023   Pap Smear  11/20/2023   Colon Cancer Screening  04/27/2026   Tetanus Vaccine  05/31/2029   Hepatitis C Screening: USPSTF Recommendation to screen - Ages 18-79 yo.  Completed   Zoster (Shingles) Vaccine  Completed   HPV Vaccine  Aged Out  *Topic was postponed. The date shown is not the original due date.    Opioid Pain Medicine Management Opioids are powerful medicines that are used to treat moderate to severe pain. When used for short periods of time, they can help you to: Sleep better. Do better in physical or occupational therapy. Feel better in the first few days after an injury. Recover from surgery. Opioids should be taken with the supervision of a trained health care provider. They should be taken for the shortest period of time possible. This is because opioids can be addictive, and the longer you take opioids, the greater your risk of addiction. This addiction can also be called opioid use disorder. What are the risks? Using opioid pain medicines for longer than 3 days increases your risk of side effects. Side effects include: Constipation. Nausea and vomiting. Breathing difficulties (respiratory depression). Drowsiness. Confusion. Opioid use disorder. Itching. Taking opioid pain medicine for a long period of time can affect your ability to do daily tasks. It also puts you at risk  for: Motor vehicle crashes. Depression. Suicide. Heart attack. Overdose, which can be life-threatening. What is a pain treatment plan? A pain treatment plan is an agreement between you and your health care provider. Pain is unique to each person, and treatments vary depending on your condition. To manage your pain, you and your health care provider need to work together. To help you do this: Discuss the goals of your treatment, including how much pain you might expect to have and how you will manage the pain. Review the risks and benefits of taking opioid medicines. Remember that a good treatment plan uses more than one approach and minimizes the chance of side effects. Be honest about the amount of medicines you take and about any drug or alcohol use. Get pain medicine prescriptions from only one health care provider. Pain can be managed with many types of alternative treatments. Ask your health care provider to refer you to one or more specialists who can help you manage pain through: Physical or occupational therapy. Counseling (cognitive behavioral therapy). Good nutrition. Biofeedback. Massage. Meditation. Non-opioid medicine. Following a gentle exercise program. How to use opioid pain medicine Taking medicine Take your pain medicine exactly as told by your health care provider. Take it only when you need it. If your pain gets less severe, you may take less than your prescribed dose if your health care provider approves. If you are not having  pain, do nottake pain medicine unless your health care provider tells you to take it. If your pain is severe, do nottry to treat it yourself by taking more pills than instructed on your prescription. Contact your health care provider for help. Write down the times when you take your pain medicine. It is easy to become confused while on pain medicine. Writing the time can help you avoid overdose. Take other over-the-counter or prescription  medicines only as told by your health care provider. Keeping yourself and others safe  While you are taking opioid pain medicine: Do not drive, use machinery, or power tools. Do not sign legal documents. Do not drink alcohol. Do not take sleeping pills. Do not supervise children by yourself. Do not do activities that require climbing or being in high places. Do not go to a lake, river, ocean, spa, or swimming pool. Do not share your pain medicine with anyone. Keep pain medicine in a locked cabinet or in a secure area where pets and children cannot reach it. Stopping your use of opioids If you have been taking opioid medicine for more than a few weeks, you may need to slowly decrease (taper) how much you take until you stop completely. Tapering your use of opioids can decrease your risk of symptoms of withdrawal, such as: Pain and cramping in the abdomen. Nausea. Sweating. Sleepiness. Restlessness. Uncontrollable shaking (tremors). Cravings for the medicine. Do not attempt to taper your use of opioids on your own. Talk with your health care provider about how to do this. Your health care provider may prescribe a step-down schedule based on how much medicine you are taking and how long you have been taking it. Getting rid of leftover pills Do not save any leftover pills. Get rid of leftover pills safely by: Taking the medicine to a prescription take-back program. This is usually offered by the county or law enforcement. Bringing them to a pharmacy that has a drug disposal container. Flushing them down the toilet. Check the label or package insert of your medicine to see whether this is safe to do. Throwing them out in the trash. Check the label or package insert of your medicine to see whether this is safe to do. If it is safe to throw it out, remove the medicine from the original container, put it into a sealable bag or container, and mix it with used coffee grounds, food scraps, dirt, or  cat litter before putting it in the trash. Follow these instructions at home: Activity Do exercises as told by your health care provider. Avoid activities that make your pain worse. Return to your normal activities as told by your health care provider. Ask your health care provider what activities are safe for you. General instructions You may need to take these actions to prevent or treat constipation: Drink enough fluid to keep your urine pale yellow. Take over-the-counter or prescription medicines. Eat foods that are high in fiber, such as beans, whole grains, and fresh fruits and vegetables. Limit foods that are high in fat and processed sugars, such as fried or sweet foods. Keep all follow-up visits. This is important. Where to find support If you have been taking opioids for a long time, you may benefit from receiving support for quitting from a local support group or counselor. Ask your health care provider for a referral to these resources in your area. Where to find more information Centers for Disease Control and Prevention (CDC): http://www.wolf.info/ [place] Food and Drug Administration (FDA):  [employer] Get help right away if: You may have taken too much of an opioid (overdosed). Common symptoms of an overdose: Your breathing is slower or more shallow than normal. You have a very slow heartbeat (pulse). You have slurred speech. You have nausea and vomiting. Your pupils become very small. You have other potential symptoms: You are very confused. You faint or feel like you will faint. You have cold, clammy skin. You have blue lips or fingernails. You have thoughts of harming yourself or harming others. These symptoms may represent a serious problem that is an emergency. Do not wait to see if the symptoms will go away. Get medical help right away. Call your local emergency services (911 in the [place] Do not drive yourself to the hospital.  If you ever feel like you may hurt yourself or  others, or have thoughts about taking your own life, get help right away. Go to your nearest emergency department or: Call your local emergency services (911 in the [place] Call the [facility] 936-035-0636 in the [place] Call a suicide crisis helpline, such as the [facility] at 5043320732 or 988 in the [place]. This is open 24 hours a day in the [place] Text the Crisis Text Line at 732-450-9822 (in the [place].). Summary Opioid medicines can help you manage moderate to severe pain for a short period of time. A pain treatment plan is an agreement between you and your health care provider. Discuss the goals of your treatment, including how much pain you might expect to have and how you will manage the pain. If you think that you or someone else may have taken too much of an opioid, get medical help right away. This information is not intended to replace advice given to you by your health care provider. Make sure you discuss any questions you have with your health care provider. Document Revised: 12/31/2020 Document Reviewed: 09/17/2020 Elsevier Patient Education  [place] directives: Yes  Conditions/risks identified: None  Next appointment: Follow up in one year for your annual wellness visit     Preventive Care 65 Years and Older, Female Preventive care refers to lifestyle choices and visits with your health care provider that can promote health and wellness. What does preventive care include? A yearly physical exam. This is also called an annual well check. Dental exams once or twice a year. Routine eye exams. Ask your health care provider how often you should have your eyes checked. Personal lifestyle choices, including: Daily care of your teeth and gums. Regular physical activity. Eating a healthy diet. Avoiding tobacco and drug use. Limiting alcohol use. Practicing safe sex. Taking low-dose aspirin every day. Taking  vitamin and mineral supplements as recommended by your health care provider. What happens during an annual well check? The services and screenings done by your health care provider during your annual well check will depend on your age, overall health, lifestyle risk factors, and family history of disease. Counseling  Your health care provider may ask you questions about your: Alcohol use. Tobacco use. Drug use. Emotional well-being. Home and relationship well-being. Sexual activity. Eating habits. History of falls. Memory and ability to understand (cognition). Work and work Statistician. Reproductive health. Screening  You may have the following tests or measurements: Height, weight, and BMI. Blood pressure. Lipid and cholesterol levels. These may be checked every 5 years, or more frequently if you are over 49 years old. Skin check. Lung cancer screening. You  may have this screening every year starting at age 62 if you have a 30-pack-year history of smoking and currently smoke or have quit within the past 15 years. Fecal occult blood test (FOBT) of the stool. You may have this test every year starting at age 25. Flexible sigmoidoscopy or colonoscopy. You may have a sigmoidoscopy every 5 years or a colonoscopy every 10 years starting at age 79. Hepatitis C blood test. Hepatitis B blood test. Sexually transmitted disease (STD) testing. Diabetes screening. This is done by checking your blood sugar (glucose) after you have not eaten for a while (fasting). You may have this done every 1-3 years. Bone density scan. This is done to screen for osteoporosis. You may have this done starting at age 39. Mammogram. This may be done every 1-2 years. Talk to your health care provider about how often you should have regular mammograms. Talk with your health care provider about your test results, treatment options, and if necessary, the need for more tests. Vaccines  Your health care provider may  recommend certain vaccines, such as: Influenza vaccine. This is recommended every year. Tetanus, diphtheria, and acellular pertussis (Tdap, Td) vaccine. You may need a Td booster every 10 years. Zoster vaccine. You may need this after age 25. Pneumococcal 13-valent conjugate (PCV13) vaccine. One dose is recommended after age 37. Pneumococcal polysaccharide (PPSV23) vaccine. One dose is recommended after age 65. Talk to your health care provider about which screenings and vaccines you need and how often you need them. This information is not intended to replace advice given to you by your health care provider. Make sure you discuss any questions you have with your health care provider. Document Released: 07/04/2015 Document Revised: 02/25/2016 Document Reviewed: 04/08/2015 Elsevier Interactive Patient Education  2017 [place] Prevention in the Home Falls can cause injuries. They can happen to people of all ages. There are many things you can do to make your home safe and to help prevent falls. What can I do on the outside of my home? Regularly fix the edges of walkways and driveways and fix any cracks. Remove anything that might make you trip as you walk through a door, such as a raised step or threshold. Trim any bushes or trees on the path to your home. Use bright outdoor lighting. Clear any walking paths of anything that might make someone trip, such as rocks or tools. Regularly check to see if handrails are loose or broken. Make sure that both sides of any steps have handrails. Any raised decks and porches should have guardrails on the edges. Have any leaves, snow, or ice cleared regularly. Use sand or salt on walking paths during winter. Clean up any spills in your garage right away. This includes oil or grease spills. What can I do in the bathroom? Use night lights. Install grab bars by the toilet and in the tub and shower. Do not use towel bars as grab bars. Use non-skid  mats or decals in the tub or shower. If you need to sit down in the shower, use a plastic, non-slip stool. Keep the floor dry. Clean up any water that spills on the floor as soon as it happens. Remove soap buildup in the tub or shower regularly. Attach bath mats securely with double-sided non-slip rug tape. Do not have throw rugs and other things on the floor that can make you trip. What can I do in the bedroom? Use night lights. Make sure that you have a  light by your bed that is easy to reach. Do not use any sheets or blankets that are too big for your bed. They should not hang down onto the floor. Have a firm chair that has side arms. You can use this for support while you get dressed. Do not have throw rugs and other things on the floor that can make you trip. What can I do in the kitchen? Clean up any spills right away. Avoid walking on wet floors. Keep items that you use a lot in easy-to-reach places. If you need to reach something above you, use a strong step stool that has a grab bar. Keep electrical cords out of the way. Do not use floor polish or wax that makes floors slippery. If you must use wax, use non-skid floor wax. Do not have throw rugs and other things on the floor that can make you trip. What can I do with my stairs? Do not leave any items on the stairs. Make sure that there are handrails on both sides of the stairs and use them. Fix handrails that are broken or loose. Make sure that handrails are as long as the stairways. Check any carpeting to make sure that it is firmly attached to the stairs. Fix any carpet that is loose or worn. Avoid having throw rugs at the top or bottom of the stairs. If you do have throw rugs, attach them to the floor with carpet tape. Make sure that you have a light switch at the top of the stairs and the bottom of the stairs. If you do not have them, ask someone to add them for you. What else can I do to help prevent falls? Wear shoes  that: Do not have high heels. Have rubber bottoms. Are comfortable and fit you well. Are closed at the toe. Do not wear sandals. If you use a stepladder: Make sure that it is fully opened. Do not climb a closed stepladder. Make sure that both sides of the stepladder are locked into place. Ask someone to hold it for you, if possible. Clearly mark and make sure that you can see: Any grab bars or handrails. First and last steps. Where the edge of each step is. Use tools that help you move around (mobility aids) if they are needed. These include: Canes. Walkers. Scooters. Crutches. Turn on the lights when you go into a dark area. Replace any light bulbs as soon as they burn out. Set up your furniture so you have a clear path. Avoid moving your furniture around. If any of your floors are uneven, fix them. If there are any pets around you, be aware of where they are. Review your medicines with your doctor. Some medicines can make you feel dizzy. This can increase your chance of falling. Ask your doctor what other things that you can do to help prevent falls. This information is not intended to replace advice given to you by your health care provider. Make sure you discuss any questions you have with your health care provider. Document Released: 04/03/2009 Document Revised: 11/13/2015 Document Reviewed: 07/12/2014 Elsevier Interactive Patient Education  2017 [employer].

## 2022-02-05 ENCOUNTER — Other Ambulatory Visit (HOSPITAL_COMMUNITY): Payer: Self-pay

## 2022-02-05 MED FILL — Fluticasone Furoate-Vilanterol Aero Powd BA 200-25 MCG/ACT: RESPIRATORY_TRACT | 30 days supply | Qty: 60 | Fill #1 | Status: AC

## 2022-02-05 MED FILL — Levothyroxine Sodium Tab 75 MCG: ORAL | 90 days supply | Qty: 90 | Fill #1 | Status: AC

## 2022-02-08 ENCOUNTER — Other Ambulatory Visit (HOSPITAL_COMMUNITY): Payer: Self-pay

## 2022-02-09 ENCOUNTER — Ambulatory Visit: Payer: Medicare Other | Admitting: Acute Care

## 2022-02-09 ENCOUNTER — Other Ambulatory Visit: Payer: Self-pay | Admitting: Family Medicine

## 2022-02-09 DIAGNOSIS — C3492 Malignant neoplasm of unspecified part of left bronchus or lung: Secondary | ICD-10-CM

## 2022-02-09 DIAGNOSIS — G894 Chronic pain syndrome: Secondary | ICD-10-CM

## 2022-02-09 NOTE — Telephone Encounter (Signed)
Pt called to request a refill of the  oxyCODONE-acetaminophen (PERCOCET/ROXICET) 5-325 MG tablet  Pt's LOV: 09/21/21  Please advise.   [facility] Phone:  [phone]  Fax:  [phone]

## 2022-02-10 DIAGNOSIS — Z1231 Encounter for screening mammogram for malignant neoplasm of breast: Secondary | ICD-10-CM | POA: Diagnosis not present

## 2022-02-11 ENCOUNTER — Other Ambulatory Visit (HOSPITAL_COMMUNITY): Payer: Self-pay

## 2022-02-11 MED ORDER — OXYCODONE-ACETAMINOPHEN 5-325 MG PO TABS
0.5000 | ORAL_TABLET | Freq: Three times a day (TID) | ORAL | 0 refills | Status: AC | PRN
  Filled 2022-02-11 – 2022-02-18 (×2): qty 45, 30d supply, fill #0

## 2022-02-16 ENCOUNTER — Other Ambulatory Visit (HOSPITAL_COMMUNITY): Payer: Self-pay

## 2022-02-17 ENCOUNTER — Other Ambulatory Visit (HOSPITAL_COMMUNITY): Payer: Self-pay

## 2022-02-18 ENCOUNTER — Other Ambulatory Visit (HOSPITAL_COMMUNITY): Payer: Self-pay

## 2022-02-23 ENCOUNTER — Other Ambulatory Visit: Payer: Self-pay | Admitting: Family Medicine

## 2022-02-23 ENCOUNTER — Other Ambulatory Visit (HOSPITAL_COMMUNITY): Payer: Self-pay

## 2022-02-23 DIAGNOSIS — I10 Essential (primary) hypertension: Secondary | ICD-10-CM

## 2022-02-23 DIAGNOSIS — K219 Gastro-esophageal reflux disease without esophagitis: Secondary | ICD-10-CM

## 2022-02-23 MED ORDER — OMEPRAZOLE 20 MG PO CPDR
20.0000 mg | DELAYED_RELEASE_CAPSULE | Freq: Two times a day (BID) | ORAL | 2 refills | Status: AC
  Filled 2022-02-23: qty 180, 90d supply, fill #0

## 2022-02-23 MED ORDER — AMLODIPINE BESYLATE 5 MG PO TABS
5.0000 mg | ORAL_TABLET | Freq: Every day | ORAL | 3 refills | Status: AC
  Filled 2022-02-23: qty 90, 90d supply, fill #0

## 2022-02-25 ENCOUNTER — Telehealth: Payer: Self-pay | Admitting: Pharmacist

## 2022-02-25 NOTE — Chronic Care Management (AMB) (Signed)
    Chronic Care Management Pharmacy Assistant   Name: Tanica Gaige  MRN: [id number] DOB: 05/11/1956  02/25/22 APPOINTMENT REMINDER   Called Patient No answer, left message of appointment on 03/01/22 at 9 am via office visit with Jeni Salles, Pharm D.   Notified to have all medications, supplements, blood pressure and/or blood sugar logs available during appointment and to return call if need to reschedule.  On msg reminded pt to bring inhalers*  Care Gaps: COVID Booster - Overdue Flu Vaccine - Overdue HIV Screening - Postponed BP- 154/67 on 10/28/2021 AWV- 8/23   Star Rating Drug: Atorvastatin 40 mg - last filled 01/12/2022 90 DS at [facility]     Medications: Outpatient Encounter Medications as of 02/25/2022  Medication Sig   albuterol (VENTOLIN HFA) 108 (90 Base) MCG/ACT inhaler Inhale 2 puffs into the lungs every 6 (six) hours as needed for wheezing or shortness of breath.   amLODipine (NORVASC) 5 MG tablet Take 1 tablet (5 mg total) by mouth daily.   aspirin EC 81 MG tablet Take 81 mg by mouth daily.   atorvastatin (LIPITOR) 40 MG tablet Take 1 tablet by mouth daily.   Azelastine HCl 137 MCG/SPRAY SOLN USE 2 SPRAY(S) IN EACH NOSTRIL TWICE DAILY AS DIRECTED   betamethasone dipropionate (DIPROLENE) 0.05 % cream Apply topically daily as needed.   BREO ELLIPTA 200-25 MCG/ACT AEPB Inhale 1 puff by mouth once daily   clotrimazole-betamethasone (LOTRISONE) cream APPLY ONE APPLICATION TOPICALLY DAILY.   FIBER ADULT GUMMIES PO Take by mouth.   fluticasone (FLONASE) 50 MCG/ACT nasal spray Place 1 spray into both nostrils 2 (two) times daily as needed.   guaiFENesin (MUCINEX) 600 MG 12 hr tablet Take 1 tablet (600 mg total) by mouth 2 (two) times daily. (Patient taking differently: Take 600 mg by mouth daily.)   levothyroxine (SYNTHROID) 75 MCG tablet TAKE 1 TABLET BY MOUTH  DAILY BEFORE BREAKFAST   loratadine (CLARITIN) 10 MG tablet Take 10 mg by mouth daily.   nystatin cream  (MYCOSTATIN) Apply 1 application. topically 2 (two) times daily.   omeprazole (PRILOSEC) 20 MG capsule Take 1 capsule (20 mg total) by mouth 2 (two) times daily before a meal.   oxyCODONE-acetaminophen (PERCOCET/ROXICET) 5-325 MG tablet Take 1/2 tablet by mouth every 8 hours as needed for severe pain.   Probiotic Product (PROBIOTIC ADVANCED PO) Take 1 capsule by mouth daily.   Respiratory Therapy Supplies (FLUTTER) DEVI Use as directed   triamcinolone (KENALOG) 0.1 % paste Apply 2 times daily on oral lesion for 14 days.   Facility-Administered Encounter Medications as of 02/25/2022  Medication   sodium chloride flush (NS) 0.9 % injection 10 mL      Ned Clines [place] Pharmacist Assistant [phone]

## 2022-03-01 ENCOUNTER — Ambulatory Visit (INDEPENDENT_AMBULATORY_CARE_PROVIDER_SITE_OTHER): Payer: Medicare Other | Admitting: Pharmacist

## 2022-03-01 VITALS — BP 132/84

## 2022-03-01 DIAGNOSIS — J449 Chronic obstructive pulmonary disease, unspecified: Secondary | ICD-10-CM

## 2022-03-01 DIAGNOSIS — I10 Essential (primary) hypertension: Secondary | ICD-10-CM

## 2022-03-01 NOTE — Progress Notes (Signed)
Chronic Care Management Pharmacy Note  03/01/2022 Name:  Felicia Herrera MRN:  [id number] DOB:  09-21-1955  Summary: Pt is still not sleeping well overall Pt notes more SOB lately Pt is not checking BP at home regularly  Recommendations/Changes made from today's visit: -Recommended kegel exercises and provided handout -Recommend switching Breo to Trelegy given more frequent COPD symptoms and reached out to pulmonary -Recommended restarting BP monitoring at home  Plan: COPD assessment in 1-2 months Follow up in 6 months   Subjective: Felicia Herrera is an 66 y.o. year old female who is a primary patient of [name], Malka So, MD.  The CCM team was consulted for assistance with disease management and care coordination needs.    Engaged with patient face to face for follow up visit in response to provider referral for pharmacy case management and/or care coordination services.   Consent to Services:  The patient was given information about Chronic Care Management services, agreed to services, and gave verbal consent prior to initiation of services.  Please see initial visit note for detailed documentation.   Patient Care Team: [name], [name], MD as PCP - General (Family Medicine) Viona Gilmore, [facility] as Pharmacist (Pharmacist) Brand Males, MD as Consulting Physician (Pulmonary Disease)  Recent office visits: 02/01/22 Rolene Arbour, LPN: Patient presented for AWV.  01/12/22 Betty [name], MD: Patient presented for video visit for loose stools.  09/21/2021 Betty [name] MD - Patient was seen for Primary hypothyroidism and additional issues. No medication changes. Follow up in 5 months.  Recent consult visits: 10/28/2021 Curt Bears MD (oncology) - Patient was seen for Malignant neoplasm of unspecified part of unspecified bronchus or lung and additional issues. No medication changes. Follow up in 6 months.  12/01/20 Rexene Edison, NP (pulmonary): Patient presented  for COPD follow up.  Hospital visits: None in previous 6 months  Objective:  Lab Results  Component Value Date   CREATININE 0.83 01/13/2022   BUN 14 01/13/2022   GFR 74.36 01/13/2022   GFRNONAA >60 10/23/2021   GFRAA >60 10/22/2019   NA 138 01/13/2022   K 3.4 (L) 01/13/2022   CALCIUM 9.0 01/13/2022   CO2 26 01/13/2022    Lab Results  Component Value Date/Time   HGBA1C 6.1 11/19/2020 04:05 PM   HGBA1C 6.1 (A) 02/12/2019 05:27 PM   HGBA1C 6.0 10/18/2017 12:13 PM   GFR 74.36 01/13/2022 10:45 AM   GFR 77.38 07/15/2020 07:14 AM    Last diabetic Eye exam: No results found for: "HMDIABEYEEXA"  Last diabetic Foot exam: No results found for: "HMDIABFOOTEX"   Lab Results  Component Value Date   CHOL 155 11/19/2020   HDL 51.50 11/19/2020   LDLCALC 75 11/19/2020   TRIG 142.0 11/19/2020   CHOLHDL 3 11/19/2020       Latest Ref Rng & Units 10/23/2021    2:03 PM 04/27/2021    3:08 PM 10/24/2020   11:49 AM  Hepatic Function  Total Protein 6.5 - 8.1 g/dL 7.5  7.2  7.0   Albumin 3.5 - 5.0 g/dL 4.0  3.4  3.6   AST 15 - 41 U/L _0 ALT 0 - 44 U/L _1 Alk Phosphatase 38 - 126 U/L 92  111  101   Total Bilirubin 0.3 - 1.2 mg/dL 0.3  0.3  0.4     Lab Results  Component Value Date/Time   TSH 4.96 01/13/2022 10:45 AM  TSH 2.57 09/21/2021 10:22 AM       Latest Ref Rng & Units 01/13/2022   10:45 AM 10/23/2021    2:03 PM 04/27/2021    3:08 PM  CBC  WBC 4.0 - 10.5 K/uL 8.4  7.3  11.3   Hemoglobin 12.0 - 15.0 g/dL 11.5  10.4  10.7   Hematocrit 36.0 - 46.0 % 36.4  33.6  33.7   Platelets 150.0 - 400.0 K/uL 568.0  406  350     No results found for: "VD25OH"  Clinical ASCVD: No  The 10-year ASCVD risk score (Arnett DK, et al., 2019) is: 6.4%   Values used to calculate the score:     Age: 66 years     Sex: Female     Is Non-Hispanic African American: No     Diabetic: No     Tobacco smoker: No     Systolic Blood Pressure: 101 mmHg     Is BP treated: Yes     HDL  Cholesterol: 51.5 mg/dL     Total Cholesterol: 155 mg/dL       02/01/2022   10:46 AM 09/21/2021    9:45 AM 05/22/2021   12:39 PM  Depression screen PHQ 2/9  Decreased Interest 0 0 0  Down, Depressed, Hopeless 0 0 0  PHQ - 2 Score 0 0 0  Altered sleeping   0  Tired, decreased energy   0  Change in appetite   0  Feeling bad or failure about yourself    0  Trouble concentrating   0  Moving slowly or fidgety/restless   0  Suicidal thoughts   0  PHQ-9 Score   0     Social History   Tobacco Use  Smoking Status Former   Packs/day: 1.00   Years: 30.00   Total pack years: 30.00   Types: Cigarettes   Quit date: 06/21/2005   Years since quitting: 16.7  Smokeless Tobacco Never   BP Readings from Last 3 Encounters:  03/01/22 132/84  02/01/22 132/82  10/28/21 (!) 154/67   Pulse Readings from Last 3 Encounters:  02/01/22 66  10/28/21 65  09/21/21 87   Wt Readings from Last 3 Encounters:  02/01/22 153 lb 8 oz (69.6 kg)  10/28/21 157 lb 1 oz (71.2 kg)  09/21/21 159 lb 4 oz (72.2 kg)    Assessment/Interventions: Review of patient past medical history, allergies, medications, health status, including review of consultants reports, laboratory and other test data, was performed as part of comprehensive evaluation and provision of chronic care management services.   SDOH:  (Social Determinants of Health) assessments and interventions performed: No SDOH Interventions    Flowsheet Row Chronic Care Management from 03/01/2022 in [place] at [place] from 02/01/2022 in [place] at [place] Management from 01/22/2020 in [place] at [place] Interventions -- Intervention Not Indicated --  Housing Interventions -- Intervention Not Indicated --  Transportation Interventions -- Intervention Not Indicated Intervention Not Indicated  Financial Strain Interventions Intervention Not Indicated  Intervention Not Indicated Intervention Not Indicated  Physical Activity Interventions -- Intervention Not Indicated --  Stress Interventions -- Intervention Not Indicated --  Social Connections Interventions -- Intervention Not Indicated --       CCM Care Plan  Allergies  Allergen Reactions   Anoro Ellipta [Umeclidinium-Vilanterol]     Taste issues    Medications Reviewed Today     Reviewed  by Viona Gilmore, [place] (Pharmacist) on 03/01/22 at [phone]  Med List Status: <None>   Medication Order Taking? Sig Documenting Provider Last Dose Status Informant  albuterol (VENTOLIN HFA) 108 (90 Base) MCG/ACT inhaler [id number] Yes Inhale 2 puffs into the lungs every 6 (six) hours as needed for wheezing or shortness of breath. Brand Males, MD Taking Active   amLODipine (NORVASC) 5 MG tablet [id number]  Take 1 tablet (5 mg total) by mouth daily. [name], [name], MD  Active   aspirin EC 81 MG tablet [id number]  Take 81 mg by mouth daily. [provider]  Active   atorvastatin (LIPITOR) 40 MG tablet [id number]  Take 1 tablet by mouth daily. [name], [name], MD  Active   Azelastine HCl 137 MCG/SPRAY SOLN [id number]  USE 2 SPRAY(S) IN EACH NOSTRIL TWICE DAILY AS DIRECTED [name], [name], MD  Active   betamethasone dipropionate (DIPROLENE) 0.05 % cream [id number]  Apply topically daily as needed. [name], [name], MD  Active   BREO ELLIPTA 200-25 MCG/ACT AEPB [id number] Yes Inhale 1 puff by mouth once daily Brand Males, MD Taking Active   clotrimazole-betamethasone (LOTRISONE) cream [id number]  APPLY ONE APPLICATION TOPICALLY DAILY. [name], [name], MD  Active   FIBER ADULT GUMMIES PO [id number]  Take by mouth. [provider]  Active   fluticasone (FLONASE) 50 MCG/ACT nasal spray [id number]  Place 1 spray into both nostrils 2 (two) times daily as needed. [name], [name], MD  Active   guaiFENesin (MUCINEX) 600 MG 12 hr tablet [id number]  Take 1 tablet (600 mg total) by mouth 2 (two)  times daily.  Patient taking differently: Take 600 mg by mouth daily.   Martyn Ehrich, NP  Active   levothyroxine (SYNTHROID) 75 MCG tablet [id number]  TAKE 1 TABLET BY MOUTH  DAILY BEFORE BREAKFAST [name], [name], MD  Active   loratadine (CLARITIN) 10 MG tablet [id number]  Take 10 mg by mouth daily. [provider]  Active   nystatin cream (MYCOSTATIN) [id number]  Apply 1 application. topically 2 (two) times daily. [name], [name], MD  Active   omeprazole (PRILOSEC) 20 MG capsule [id number]  Take 1 capsule (20 mg total) by mouth 2 (two) times daily before a meal. [name], [name], MD  Active   oxyCODONE-acetaminophen (PERCOCET/ROXICET) 5-325 MG tablet [id number]  Take 1/2 tablet by mouth every 8 hours as needed for severe pain. [name], [name], MD  Active   Probiotic Product (PROBIOTIC ADVANCED PO) [id number]  Take 1 capsule by mouth daily. [provider]  Active   Respiratory Therapy Supplies (FLUTTER) [place] [id number]  Use as directed Martyn Ehrich, NP  Active   triamcinolone (KENALOG) 0.1 % paste [id number]  Apply 2 times daily on oral lesion for 14 days. 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   Stage 2 moderate COPD by GOLD classification ([place]) 02/22/2017   Adenocarcinoma of left lung, stage 2 ([place]) 12/27/2016  Encounter for antineoplastic chemotherapy 12/27/2016   Goals of care, counseling/discussion 12/27/2016   GERD  (gastroesophageal reflux disease) 12/17/2016   Hypertension, essential, benign 12/17/2016   History of COPD 12/09/2016   History of lung cancer 12/09/2016   Lung cancer ([place]) 11/04/2016   Primary hypothyroidism 11/04/2016    Immunization History  Administered Date(s) Administered   Influenza, Quadrivalent, Recombinant, Inj, Pf 03/12/2018   Influenza,inj,Quad PF,6+ Mos 03/21/2016, 02/22/2017, 03/06/2019, 04/03/2020   Influenza-Unspecified 03/27/2021   PFIZER(Purple Top)SARS-COV-2 Vaccination 09/10/2019, 10/02/2019, 03/18/2020   PNEUMOCOCCAL CONJUGATE-20 04/10/2021   Pfizer Covid-19 Vaccine Bivalent Booster 69yr & up 03/27/2021   Tdap 06/01/2019   Zoster Recombinat (Shingrix) 06/01/2019, 08/13/2019   Patient reports her biggest concern is with her shortness of breath. She is using her albuterol a few times a week but still notes more SOB than usual. She has more SOB when walking places, especially to her trashcan. Recommended use of albuterol prior to walking. Patient is not rinsing her mouth out with each use and reviewed proper inhaler technique. Patient quit smoking back in Nov of 2007.  Patient is still not sleeping well but reports the melatonin is helping. She thinks that her poor sleep is related to her frequent bathroom trips during the night. She usually goes about 3 times each night. She is also wearing pads constantly as she has had a few accidents in public.     Conditions to be addressed/monitored:  Hypertension, Coronary Artery Disease, COPD, Hypothyroidism, Osteoporosis, and lung cancer  Conditions addressed this visit: COPD, hypertension, insomnia  Care Plan : CCM Pharmacy Care Plan  Updates made by PViona Gilmore RCheweysince 03/01/2022 12:00 AM     Problem: Problem: Hypertension, Coronary Artery Disease, COPD, Hypothyroidism and lung cancer      Long-Range Goal: Patient-Specific Goal   Start Date: 08/01/2020  Expected End Date: 08/01/2021  Recent Progress: On  track  Priority: High  Note:   Current Barriers:  Unable to independently monitor therapeutic efficacy Unable to self administer medications as prescribed  Pharmacist Clinical Goal(s):  Patient will achieve adherence to monitoring guidelines and medication adherence to achieve therapeutic efficacy achieve control of blood pressure as evidenced by home blood pressure monitoring  through collaboration with PharmD and provider.   Interventions: 1:1 collaboration with JMartinique [name], MD regarding development and update of comprehensive plan of care as evidenced by provider attestation and co-signature Inter-disciplinary care team collaboration (see longitudinal plan of care) Comprehensive medication review performed; medication list updated in electronic medical record  Hypertension (BP goal <130/80) -Controlled -Current treatment: Amlodipine 5 mg 1/2 tablet daily - Appropriate, Query effective, Safe, Accessible -Medications previously tried: none  -Current home readings: 115/79 (not checking often) -Current dietary habits: did not discuss -Current exercise habits: did not discuss -Denies hypotensive/hypertensive symptoms -Educated on Importance of home blood pressure monitoring; Proper BP monitoring technique; -Counseled to monitor BP at home weekly, document, and provide log at future appointments -Recommended to continue current medication -Advised on proper use of blood pressure cuff and compared office to home BP readings  Hyperlipidemia/CAD: (LDL goal < 70) -Controlled -Current treatment: Aspirin 81 mg daily - Appropriate, Effective, Safe, Accessible Atorvastatin 40 mg daily - Appropriate, Effective, Safe, Accessible -Medications previously tried: none  -Current dietary patterns: did not discuss -Current exercise habits: did not discuss -Educated on Cholesterol goals;  -Recommended to continue current medication  COPD (Goal: control symptoms and prevent  exacerbations) -Uncontrolled -Current treatment  Ventolin HFA 2 puffs every 6 hours as  needed  - Appropriate, Effective, Safe, Accessible Breo ellipta 1 puff daily - Query Appropriate, Effective, Safe, Accessible Flutter device 2-3 times a day -Medications previously tried: Spiriva (difficult to use), Breo (recurrent pneumonia), Anoro (left a weird taste in her mouth) -Gold Grade: Gold 2 (FEV1 50-79%) -Current COPD Classification:  B (high sx, <2 exacerbations/yr) -CAT score: 18 -Pulmonary function testing: 01/17/17 -Exacerbations requiring treatment in last 6 months: yes -Patient denies consistent use of maintenance inhaler -Frequency of rescue inhaler use: daily -Counseled on Proper inhaler technique; Benefits of consistent maintenance inhaler use Differences between maintenance and rescue inhalers -Recommended to continue current medication Collaborated with pulmonary to consider Trelegy inhaler.  Hypothyroidism (Goal: TSH 2.5-4.5 with osteoporosis diagnosis) -Controlled -Current treatment  Levothyroxine 75 mcg daily  - 1/2 tablet on Mondays and 1 tablet all other days - Appropriate, Effective, Safe, Accessible -Medications previously tried: none  -Recommended to continue current medication  Osteoporosis (Goal prevent fractures) -Uncontrolled -Last DEXA Scan: 12/08/20   T-Score femoral neck: -2.9  T-Score total hip: n/a  T-Score lumbar spine: -1.2  T-Score forearm radius: n/a  10-year probability of major osteoporotic fracture: n/a  10-year probability of hip fracture: n/a -Patient is a candidate for pharmacologic treatment due to T-Score < -2.5 in femoral neck -Current treatment  Calcium Vitamin D -Medications previously tried: none  -Recommend [phone] units of vitamin D daily. Recommend 1200 mg of calcium daily from dietary and supplemental sources. Recommend weight-bearing and muscle strengthening exercises for building and maintaining bone density. -Recommended  switching to calcium citrate for better absorption.  Allergic rhinitis (Goal: minimize symptoms of allergies) -Uncontrolled -Current treatment  Flonase 50 mcg/act 1 spray twice daily  - not taking Azelastine 0.1% nasal spray 2 sprays in both nostrils twice daily - Appropriate, Effective, Safe, Accessible Saline nasal spray as needed - Appropriate, Query effective, Safe, Accessible Mucinex 600 mg tablets as needed - Appropriate, Query effective, Safe, Accessible Benadryl as needed for insect bites - Appropriate, Effective, Safe, Accessible -Medications previously tried: several -Recommended mucinex without DM to try for congestion and if this does not help, try Zyrtec or Claritin over the counter Educated on the differences between the nasal sprays and various over the counter cold and cough products  GERD (Goal: minimize symptoms of heartburn/acid reflux) -Controlled -Current treatment  Omeprazole 20 mg twice daily - Appropriate, Effective, Safe, Accessible -Medications previously tried: none  -Recommended to continue current medication  Pain (Goal: minimize symptoms of pain) -Controlled -Current treatment  Oxycodone 5-325 mg 1/2 tablet every 6 hours as needed - Appropriate, Effective, Query Safe, Accessible -Medications previously tried: none  -Recommended to continue current medication Counseled on limiting use    Health Maintenance -Vaccine gaps: none -Current therapy:  Betamethasone 0.05% cream  Clotrimazole 1% cream  Docusate 100 mg BID (stopped diarrhea)  Nystatin cream Prochlorperazine 10 mg q6hr Probiotic 1 tablet daily -Educated on Supplements may interfere with prescription drugs -Patient is satisfied with current therapy and denies issues -Recommended to continue current medication  Patient Goals/Self-Care Activities Patient will:  - take medications as prescribed check blood pressure weekly, document, and provide at future appointments  Follow Up Plan: Face  to Face appointment with care management team member scheduled for:  6 months       Medication Assistance: None required.  Patient affirms current coverage meets needs.  Compliance/Adherence/Medication fill history: Care Gaps: COVID vaccine, influenza, HIV screening Last BP - 154/67 on 10/28/2021  Star-Rating Drugs: Atorvastatin 40 mg - last filled 01/12/2022 90  DS at [facility]  Patient's preferred pharmacy is:  [facility] [place] [address] Phone: [phone] Fax: [phone]   Uses pill box? Yes - uses a one a day - using a new one that is working better than previous Pt endorses 99% compliance  We discussed: Current pharmacy is preferred with insurance plan and patient is satisfied with pharmacy services Patient decided to: Continue current medication management strategy  Care Plan and Follow Up Patient Decision:  Patient agrees to Care Plan and Follow-up.  Plan: Face to Face appointment with care management team member scheduled for: 6 months  Jeni Salles, PharmD [place] Pharmacist [place] [facility] [place] [phone]

## 2022-03-01 NOTE — Patient Instructions (Signed)
Felicia Herrera, PharmD, [place] Pharmacist [place] at [place]

## 2022-03-02 ENCOUNTER — Encounter: Payer: Self-pay | Admitting: Acute Care

## 2022-03-02 ENCOUNTER — Ambulatory Visit (INDEPENDENT_AMBULATORY_CARE_PROVIDER_SITE_OTHER): Payer: Medicare Other | Admitting: Acute Care

## 2022-03-02 VITALS — BP 130/78 | HR 90 | Temp 98.4°F | Ht 62.0 in | Wt 157.4 lb

## 2022-03-02 DIAGNOSIS — J449 Chronic obstructive pulmonary disease, unspecified: Secondary | ICD-10-CM | POA: Diagnosis not present

## 2022-03-02 DIAGNOSIS — Z87891 Personal history of nicotine dependence: Secondary | ICD-10-CM

## 2022-03-02 MED ORDER — TRELEGY ELLIPTA 200-62.5-25 MCG/ACT IN AEPB: 1.0000 | INHALATION_SPRAY | Freq: Every day | RESPIRATORY_TRACT | 0 refills | Status: AC

## 2022-03-02 NOTE — Patient Instructions (Addendum)
It is good to see you today. We will do a therapeutic Trial with Trelegy to see if you feel this works better for you than Breo. 1 puff , once daily Rinse mouth after use  Let us know if you would like to switch to this medication.  If you do not like Trelegy, we can try [facility], which is another medication.  We will schedule you for PFT's, since you have not had any since 2018 Note your daily symptoms > remember "red flags" for COPD:  Increase in cough, increase in sputum production,and change in color of sputum, increase in shortness of breath with or without activity.  If you notice these symptoms, please call to be seen.   Follow up after PFT's with Judson Roch  NP or Dr. Chase Caller Follow up with Dr. Julien Nordmann as scheduled in November.  Please contact office for sooner follow up if symptoms do not improve or worsen or seek emergency care

## 2022-03-02 NOTE — Progress Notes (Signed)
History of Present Illness Felicia Herrera is a 66 y.o. female  former smoker with with Stage 2 moderate COPD., left lung cancer adenocarcinoma stage IIb status post chemo and radiation in 2018, Previous Pneumonectomy -Right 2007 (lung cancer ) . She is followed by Dr. Chase Caller.  Maintenance Breo 200 one puff once daily.   03/02/2022 Pt. Presents for follow up. She states she has been doing well. She states she is at her baseline. Her last visit, she had a cellulitis from a vaccine. This got better with Doxycycline. She is currently using Breo 200 . She would like to try Trelegy as she continues too have breakthrough shortness of breath. She does use Albuterol 1-2 times a week.  No wheezing , cough is productive 3-4 times daily. It is light yellow in color.  We will do a therapeutic Trial with Trelegy to see if you feel this works better  than [employer].Last PFT's were in 2018, so we will repeat these to assess pulmonary function.  She does have a chronic cough. She states she has had this since 2007. She uses cough drops to help. Her most recent surveillance CT Chest 10/2021 was negative for any signs of metastasis.She has her next follow up with Dr. Julien Nordmann 04/2022.    Test Results: CT Chest 11/09/2021 No significant interval change status post right pneumonectomy without evidence of local recurrence or thoracic metastatic disease. 2. 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 3. Unchanged left lower lobe peribronchovascular ground-glass and ill-defined nodularity, again suspicious for chronic aspiration or infection. 4. Aortic Atherosclerosis (ICD10-I70.0) and Emphysema (ICD10-J43.9).     Latest Ref Rng & Units 01/13/2022   10:45 AM 10/23/2021    2:03 PM 04/27/2021    3:08 PM  CBC  WBC 4.0 - 10.5 K/uL 8.4  7.3  11.3   Hemoglobin 12.0 - 15.0 g/dL 11.5  10.4  10.7   Hematocrit 36.0 - 46.0 % 36.4  33.6  33.7   Platelets 150.0 - 400.0 K/uL 568.0  406  350         Latest Ref Rng & Units 01/13/2022   10:45 AM 10/23/2021    2:03 PM 04/27/2021    3:08 PM  BMP  Glucose 70 - 99 mg/dL 138  90  85   BUN 6 - 23 mg/dL 14  20  13    Creatinine 0.40 - 1.20 mg/dL 0.83  0.73  0.73   Sodium 135 - 145 mEq/L 138  140  142   Potassium 3.5 - 5.1 mEq/L 3.4  4.0  4.1   Chloride 96 - 112 mEq/L 100  104  105   CO2 19 - 32 mEq/L 26  30  28    Calcium 8.4 - 10.5 mg/dL 9.0  9.0  8.8     BNP No results found for: "BNP"  ProBNP    Component Value Date/Time   PROBNP 249.0 (H) 03/05/2019 1237    PFT    Component Value Date/Time   FEV1PRE 1.08 01/17/2017 0951   FEV1POST 1.25 01/17/2017 0951   FVCPRE 1.84 01/17/2017 0951   FVCPOST 2.07 01/17/2017 0951   TLC 4.04 01/17/2017 0951   DLCOUNC 10.43 01/17/2017 0951   PREFEV1FVCRT 59 01/17/2017 0951   PSTFEV1FVCRT 60 01/17/2017 0951    No results found.   Past medical hx Past Medical History:  Diagnosis Date   Adenocarcinoma of left lung, stage 2 ([place]) 12/27/2016   Cancer ([place])    Lun cancer: Right  Dx 2008, s/p lobectomy. Left Dx 09/2016   COPD (chronic obstructive pulmonary disease) ([place])    Encounter for antineoplastic chemotherapy 12/27/2016   History of radiation therapy 01/24/17-03/08/17   left lung 2 Gy in 30 fractions   Hyperlipidemia    Hypertension    Stroke [facility])      Social History   Tobacco Use   Smoking status: Former    Packs/day: 1.00    Years: 30.00    Total pack years: 30.00    Types: Cigarettes    Quit date: 06/21/2005    Years since quitting: 16.7   Smokeless tobacco: Never  Vaping Use   Vaping Use: Never used  Substance Use Topics   Alcohol use: No   Drug use: No    Ms.Buster reports that she quit smoking about 16 years ago. Her smoking use included cigarettes. She has a 30.00 pack-year smoking history. She has never used smokeless tobacco. She reports that she does not drink alcohol and does not use drugs.  Tobacco Cessation: Former smoker , quit 2007 with a 30 pack year  smoking history Cancer survivor with Stage 2 adenocarcinoma of the lung , 2008, and 2018, with lobectomy of right lung and chemo radiation for  left.    Past surgical hx, Family hx, Social hx all reviewed.  Current Outpatient Medications on File Prior to Visit  Medication Sig   albuterol (VENTOLIN HFA) 108 (90 Base) MCG/ACT inhaler Inhale 2 puffs into the lungs every 6 (six) hours as needed for wheezing or shortness of breath.   amLODipine (NORVASC) 5 MG tablet Take 1 tablet (5 mg total) by mouth daily.   aspirin EC 81 MG tablet Take 81 mg by mouth daily.   atorvastatin (LIPITOR) 40 MG tablet Take 1 tablet by mouth daily.   Azelastine HCl 137 MCG/SPRAY SOLN USE 2 SPRAY(S) IN EACH NOSTRIL TWICE DAILY AS DIRECTED   betamethasone dipropionate (DIPROLENE) 0.05 % cream Apply topically daily as needed.   BREO ELLIPTA 200-25 MCG/ACT AEPB Inhale 1 puff by mouth once daily   clotrimazole-betamethasone (LOTRISONE) cream APPLY ONE APPLICATION TOPICALLY DAILY.   FIBER ADULT GUMMIES PO Take by mouth.   fluticasone (FLONASE) 50 MCG/ACT nasal spray Place 1 spray into both nostrils 2 (two) times daily as needed.   guaiFENesin (MUCINEX) 600 MG 12 hr tablet Take 1 tablet (600 mg total) by mouth 2 (two) times daily. (Patient taking differently: Take 600 mg by mouth daily.)   levothyroxine (SYNTHROID) 75 MCG tablet TAKE 1 TABLET BY MOUTH  DAILY BEFORE BREAKFAST   loratadine (CLARITIN) 10 MG tablet Take 10 mg by mouth daily.   nystatin cream (MYCOSTATIN) Apply 1 application. topically 2 (two) times daily.   omeprazole (PRILOSEC) 20 MG capsule Take 1 capsule (20 mg total) by mouth 2 (two) times daily before a meal.   oxyCODONE-acetaminophen (PERCOCET/ROXICET) 5-325 MG tablet Take 1/2 tablet by mouth every 8 hours as needed for severe pain.   Probiotic Product (PROBIOTIC ADVANCED PO) Take 1 capsule by mouth daily.   Respiratory Therapy Supplies (FLUTTER) DEVI Use as directed   triamcinolone (KENALOG) 0.1 % paste  Apply 2 times daily on oral lesion for 14 days.   Current Facility-Administered Medications on File Prior to Visit  Medication   sodium chloride flush (NS) 0.9 % injection 10 mL     Allergies  Allergen Reactions   Anoro Ellipta [Umeclidinium-Vilanterol]     Taste issues    Review Of Systems:  Constitutional:   No  weight loss, night sweats,  Fevers, chills, fatigue, or  lassitude.  HEENT:   No headaches,  Difficulty swallowing,  Tooth/dental problems, or  Sore throat,                No sneezing, itching, ear ache, nasal congestion, post nasal drip,   CV:  No chest pain,  Orthopnea, PND, swelling in lower extremities, anasarca, dizziness, palpitations, syncope.   GI  No heartburn, indigestion, abdominal pain, nausea, vomiting, diarrhea, change in bowel habits, loss of appetite, bloody stools.   Resp: + shortness of breath with exertion less at rest.  Baseline  excess mucus, baseline  productive cough,  No non-productive cough,  No coughing up of blood.  No change in color of mucus.  No  wheezing.  No chest wall deformity  Skin: no rash or lesions.  GU: no dysuria, change in color of urine, no urgency or frequency.  No flank pain, no hematuria   MS:  No joint pain or swelling.  No decreased range of motion.  No back pain.  Psych:  No change in mood or affect. No depression or anxiety.  No memory loss.   Vital Signs BP 130/78 (BP Location: Left Arm, Patient Position: Sitting, Cuff Size: Normal)   Pulse 90   Temp 98.4 F (36.9 C) (Oral)   Ht 5\' 2"  (1.575 m)   Wt 157 lb 6.4 oz (71.4 kg)   SpO2 93%   BMI 28.79 kg/m    Physical Exam:  General- No distress,  A&Ox3, pleasant  ENT: No sinus tenderness, TM clear, pale nasal mucosa, no oral exudate,no post nasal drip, no LAN Cardiac: S1, S2, regular rate and rhythm, no murmur Chest: No wheeze/ rales/ dullness; no accessory muscle use, no nasal flaring, no sternal retractions, diminished per right base Abd.: Soft Non-tender,  ND, BS +, Body mass index is 28.79 kg/m.  Ext: No clubbing cyanosis, edema Neuro:  normal strength, MAE x 4, A&O x 3 Skin: No rashes, warm and dry, no lesions  Psych: normal mood and behavior   Assessment/Plan  COPD, stable interval Plan We will do a therapeutic Trial with Trelegy to see if you feel this works better for you than Breo. 1 puff , once daily Rinse mouth after use  Let us know if you would like to switch to this medication.  If you do not like Trelegy, we can try [facility], which is another medication.  We will schedule you for PFT's, since you have not had any since 2018 Note your daily symptoms > remember "red flags" for COPD:  Increase in cough, increase in sputum production,and change in color of sputum, increase in shortness of breath with or without activity.  If you notice these symptoms, please call to be seen.   Follow up after PFT's with Judson Roch  NP or Dr. Chase Caller Follow up with Dr. Julien Nordmann as scheduled in November.  Please contact office for sooner follow up if symptoms do not improve or worsen or seek emergency care   Left lung cancer adenocarcinoma stage IIb status post chemo and radiation in 2018, >> radiation fibtrosis Previous Pneumonectomy -Right 2007 (lung cancer ) .  Plan Follow up with Dr. Julien Nordmann Surveillance screening per Dr. Julien Nordmann. >> Most recent scan 5.2023 no signs of recurrance  I spent 35 minutes dedicated to the care of this patient on the date of this encounter to include pre-visit review of records, face-to-face time with the patient discussing conditions above, post visit ordering of  testing, clinical documentation with the electronic health record, making appropriate referrals as documented, and communicating necessary information to the patient's healthcare team.   Magdalen Spatz, NP 03/02/2022  9:59 AM

## 2022-03-20 DIAGNOSIS — J449 Chronic obstructive pulmonary disease, unspecified: Secondary | ICD-10-CM

## 2022-03-20 DIAGNOSIS — I10 Essential (primary) hypertension: Secondary | ICD-10-CM

## 2022-03-21 DEATH — deceased

## 2022-03-25 ENCOUNTER — Telehealth: Payer: Self-pay | Admitting: Family Medicine

## 2022-03-25 NOTE — Telephone Encounter (Signed)
Certificate in [place] system awaiting signature

## 2022-03-28 NOTE — Telephone Encounter (Signed)
I called and spoke with Ms Karen Kitchens, who was Ms Upton's only contact information.  She tells me that she saw her last the beginning of 02/2022 and she was at her baseline. Her land lord tried to reach her, she left a note on her door on 03/09/22. On 03/20/2022 Ms Bowen received a phone call to inform her that Ms. Eastwood has been found dead in her apartment.  Apparently EMS and the fire department were at the scene, there is a question of a medical examiner been there as well.  Ms Valetta Close was told that Ms Gilani was found on the floor but she does not have any other information. Her body is at the funeral home waiting to be cremated. I explained Ms Bowen why I have not completed the death certificate, I need more information about the circumstances of Ms. Yohn's death. I am going to contact her land lord, not sure about her name, Stanton Kidney? [phone].  Her bother's phone number is [phone], Joette Catching. Erisha Paugh [name], MD

## 2022-03-29 ENCOUNTER — Telehealth: Payer: Self-pay | Admitting: Family Medicine

## 2022-03-29 NOTE — Telephone Encounter (Signed)
PCP spoke with landlord.

## 2022-03-29 NOTE — Telephone Encounter (Signed)
Ms. Lelon Frohlich, Ms Brentwood returned phone call. She states that last time Ms Vanderwerf was seen outdoors was 03/07/22, when she was getting her mail. On 03/09/22 she knocked at her door and no answer, so she placed a note on her door.  She went back on 03/05/2022 because has not seen her, no answer, noted a smell; so she opened front door and found Ms Sturgell's body lying on the floor, in her bedroom on her right side and in between the wall,her dresser, and her bed. Apparently the dresser fell on top of her. She called EMS. Police was also present at the scene   She noted blood on floor and a "lump" of hair after body was removed. There was not evidence of visible wounds or significant trauma. According to Ms Lelon Frohlich, her face and body were swollen and her legs were "purple."  It was decided by EMS and police not to send her body to medical examiner but instead to the funeral home to be cremated.  Because funeral home has had the body there for several days already and asking to sign death certificate asap, I am doing so today.  It is not clear to me the cause of death. She has risk factors for CVD: Aortic atherosclerosis,HTN,HLD. Former smoker. In regard to her COPD and hx of lung adenocarcinoma, last chest CT was done on 10/26/2021: No significant interval change status post right pneumonectomy without evidence of local recurrence or thoracic metastatic disease.  Death certificate in [place] completed and signed. Called Ms Karen Kitchens to let her know as she requested. Kourtland Coopman [name], MD

## 2022-03-29 NOTE — Telephone Encounter (Signed)
Pt passed on 02/19/2022. They only had 5 days to sign the death certificate and it has been well beyond and Leanne at [employer] needs a call back as soon as possible.  Please call back  [phone]

## 2022-03-29 NOTE — Telephone Encounter (Signed)
Please let them know that I just learned about Ms./ Felicia Herrera's death a few days ago and have no information about the circumstance surrounding her death. In order to complete the death certificate I need some questions answered. I tried to contact her landlord, who was the person who found her and called EMS. Do they know if a medical examiner was called to the scene the day she was found in her apartment? Thanks, BJ

## 2022-04-20 ENCOUNTER — Ambulatory Visit: Payer: Medicare Other | Admitting: Acute Care

## 2022-04-30 ENCOUNTER — Other Ambulatory Visit: Payer: Medicare Other

## 2022-05-03 ENCOUNTER — Ambulatory Visit: Payer: Medicare Other | Admitting: Internal Medicine

## 2024-01-24 NOTE — Progress Notes (Signed)
 This encounter was created in error - please disregard.
